# Patient Record
Sex: Female | Born: 1945 | Race: Black or African American | Hispanic: No | Marital: Married | Smoking: Former smoker
Health system: Southern US, Community
[De-identification: ages and names within clinical notes are randomized; demographics above are authoritative.]

## PROBLEM LIST (undated history)

## (undated) DIAGNOSIS — D126 Benign neoplasm of colon, unspecified: Secondary | ICD-10-CM

## (undated) DIAGNOSIS — I10 Essential (primary) hypertension: Secondary | ICD-10-CM

## (undated) DIAGNOSIS — IMO0001 Reserved for inherently not codable concepts without codable children: Secondary | ICD-10-CM

## (undated) DIAGNOSIS — K219 Gastro-esophageal reflux disease without esophagitis: Secondary | ICD-10-CM

## (undated) DIAGNOSIS — E785 Hyperlipidemia, unspecified: Secondary | ICD-10-CM

## (undated) DIAGNOSIS — C801 Malignant (primary) neoplasm, unspecified: Secondary | ICD-10-CM

## (undated) DIAGNOSIS — T7840XA Allergy, unspecified, initial encounter: Secondary | ICD-10-CM

## (undated) HISTORY — DX: Hyperlipidemia, unspecified: E78.5

## (undated) HISTORY — DX: Allergy, unspecified, initial encounter: T78.40XA

## (undated) HISTORY — PX: COLONOSCOPY: SHX174

## (undated) HISTORY — DX: Gastro-esophageal reflux disease without esophagitis: K21.9

## (undated) HISTORY — PX: POLYPECTOMY: SHX149

## (undated) HISTORY — DX: Benign neoplasm of colon, unspecified: D12.6

---

## 1997-09-22 ENCOUNTER — Ambulatory Visit: Admission: RE | Admit: 1997-09-22 | Discharge: 1997-09-22 | Payer: Self-pay | Admitting: *Deleted

## 1997-09-22 ENCOUNTER — Other Ambulatory Visit: Admission: RE | Admit: 1997-09-22 | Discharge: 1997-09-22 | Payer: Self-pay | Admitting: *Deleted

## 1997-09-22 ENCOUNTER — Encounter: Admission: RE | Admit: 1997-09-22 | Discharge: 1997-09-22 | Payer: Self-pay | Admitting: Internal Medicine

## 2000-04-22 ENCOUNTER — Other Ambulatory Visit: Admission: RE | Admit: 2000-04-22 | Discharge: 2000-04-22 | Payer: Self-pay | Admitting: Gynecology

## 2001-04-22 ENCOUNTER — Encounter: Payer: Self-pay | Admitting: Emergency Medicine

## 2001-04-22 ENCOUNTER — Emergency Department (HOSPITAL_COMMUNITY): Admission: EM | Admit: 2001-04-22 | Discharge: 2001-04-22 | Payer: Self-pay | Admitting: *Deleted

## 2001-05-19 ENCOUNTER — Other Ambulatory Visit: Admission: RE | Admit: 2001-05-19 | Discharge: 2001-05-19 | Payer: Self-pay | Admitting: Gynecology

## 2001-06-09 ENCOUNTER — Emergency Department (HOSPITAL_COMMUNITY): Admission: EM | Admit: 2001-06-09 | Discharge: 2001-06-09 | Payer: Self-pay | Admitting: *Deleted

## 2001-06-09 ENCOUNTER — Encounter: Payer: Self-pay | Admitting: *Deleted

## 2001-06-11 ENCOUNTER — Encounter: Admission: RE | Admit: 2001-06-11 | Discharge: 2001-06-11 | Payer: Self-pay | Admitting: Internal Medicine

## 2001-06-11 ENCOUNTER — Encounter: Payer: Self-pay | Admitting: Internal Medicine

## 2001-06-17 DIAGNOSIS — D126 Benign neoplasm of colon, unspecified: Secondary | ICD-10-CM

## 2001-06-17 HISTORY — DX: Benign neoplasm of colon, unspecified: D12.6

## 2001-06-21 ENCOUNTER — Emergency Department (HOSPITAL_COMMUNITY): Admission: EM | Admit: 2001-06-21 | Discharge: 2001-06-21 | Payer: Self-pay | Admitting: Emergency Medicine

## 2001-06-30 ENCOUNTER — Encounter: Payer: Self-pay | Admitting: Emergency Medicine

## 2001-06-30 ENCOUNTER — Emergency Department (HOSPITAL_COMMUNITY): Admission: EM | Admit: 2001-06-30 | Discharge: 2001-07-01 | Payer: Self-pay | Admitting: Emergency Medicine

## 2001-07-08 ENCOUNTER — Emergency Department (HOSPITAL_COMMUNITY): Admission: EM | Admit: 2001-07-08 | Discharge: 2001-07-08 | Payer: Self-pay | Admitting: Emergency Medicine

## 2001-07-20 ENCOUNTER — Emergency Department (HOSPITAL_COMMUNITY): Admission: EM | Admit: 2001-07-20 | Discharge: 2001-07-20 | Payer: Self-pay | Admitting: Emergency Medicine

## 2001-07-21 ENCOUNTER — Ambulatory Visit (HOSPITAL_COMMUNITY): Admission: RE | Admit: 2001-07-21 | Discharge: 2001-07-21 | Payer: Self-pay | Admitting: Gastroenterology

## 2001-07-28 ENCOUNTER — Emergency Department (HOSPITAL_COMMUNITY): Admission: EM | Admit: 2001-07-28 | Discharge: 2001-07-28 | Payer: Self-pay | Admitting: Emergency Medicine

## 2001-10-19 ENCOUNTER — Emergency Department (HOSPITAL_COMMUNITY): Admission: EM | Admit: 2001-10-19 | Discharge: 2001-10-19 | Payer: Self-pay | Admitting: Emergency Medicine

## 2003-03-24 ENCOUNTER — Other Ambulatory Visit: Admission: RE | Admit: 2003-03-24 | Discharge: 2003-03-24 | Payer: Self-pay | Admitting: Gynecology

## 2004-04-05 ENCOUNTER — Other Ambulatory Visit: Admission: RE | Admit: 2004-04-05 | Discharge: 2004-04-05 | Payer: Self-pay | Admitting: Gynecology

## 2005-05-17 ENCOUNTER — Other Ambulatory Visit: Admission: RE | Admit: 2005-05-17 | Discharge: 2005-05-17 | Payer: Self-pay | Admitting: Gynecology

## 2005-08-22 ENCOUNTER — Other Ambulatory Visit: Admission: RE | Admit: 2005-08-22 | Discharge: 2005-08-22 | Payer: Self-pay | Admitting: Internal Medicine

## 2010-08-07 ENCOUNTER — Emergency Department (HOSPITAL_COMMUNITY)
Admission: EM | Admit: 2010-08-07 | Discharge: 2010-08-07 | Disposition: A | Discharge status: Home / Self Care | Payer: Commercial Managed Care - PPO | Attending: Emergency Medicine | Admitting: Emergency Medicine

## 2010-08-07 DIAGNOSIS — E785 Hyperlipidemia, unspecified: Secondary | ICD-10-CM | POA: Insufficient documentation

## 2010-08-07 DIAGNOSIS — Z79899 Other long term (current) drug therapy: Secondary | ICD-10-CM | POA: Insufficient documentation

## 2010-08-07 DIAGNOSIS — I10 Essential (primary) hypertension: Secondary | ICD-10-CM | POA: Insufficient documentation

## 2010-08-07 DIAGNOSIS — R5381 Other malaise: Secondary | ICD-10-CM | POA: Insufficient documentation

## 2010-08-07 DIAGNOSIS — R112 Nausea with vomiting, unspecified: Secondary | ICD-10-CM | POA: Insufficient documentation

## 2010-08-07 DIAGNOSIS — K219 Gastro-esophageal reflux disease without esophagitis: Secondary | ICD-10-CM | POA: Insufficient documentation

## 2010-08-07 LAB — DIFFERENTIAL
Basophils Absolute: 0.1 10*3/uL (ref 0.0–0.1)
Basophils Relative: 1 % (ref 0–1)
Eosinophils Absolute: 0.1 10*3/uL (ref 0.0–0.7)
Eosinophils Relative: 1 % (ref 0–5)
Lymphocytes Relative: 28 % (ref 12–46)
Lymphs Abs: 2.2 10*3/uL (ref 0.7–4.0)
Monocytes Absolute: 0.5 10*3/uL (ref 0.1–1.0)
Monocytes Relative: 6 % (ref 3–12)
Neutro Abs: 4.8 10*3/uL (ref 1.7–7.7)
Neutrophils Relative %: 64 % (ref 43–77)

## 2010-08-07 LAB — COMPREHENSIVE METABOLIC PANEL
ALT: 15 U/L (ref 0–35)
AST: 18 U/L (ref 0–37)
Albumin: 3.9 g/dL (ref 3.5–5.2)
Alkaline Phosphatase: 79 U/L (ref 39–117)
BUN: 12 mg/dL (ref 6–23)
CO2: 30 mEq/L (ref 19–32)
Calcium: 9.6 mg/dL (ref 8.4–10.5)
Chloride: 100 mEq/L (ref 96–112)
Creatinine, Ser: 0.82 mg/dL (ref 0.4–1.2)
GFR calc Af Amer: >60 mL/min (ref >60)
GFR calc non Af Amer: >60 mL/min (ref >60)
Glucose, Bld: 113 mg/dL — ABNORMAL HIGH (ref 70–99)
Potassium: 4.6 mEq/L (ref 3.5–5.1)
Sodium: 138 mEq/L (ref 135–145)
Total Bilirubin: 0.4 mg/dL (ref 0.3–1.2)
Total Protein: 7.5 g/dL (ref 6.0–8.3)

## 2010-08-07 LAB — URINALYSIS, ROUTINE W REFLEX MICROSCOPIC
Hgb urine dipstick: NEGATIVE
Nitrite: NEGATIVE
Protein, ur: NEGATIVE mg/dL
Specific Gravity, Urine: 1.021 (ref 1.005–1.030)
Urine Glucose, Fasting: NEGATIVE mg/dL
pH: 5.5 (ref 5.0–8.0)

## 2010-08-07 LAB — CBC
HCT: 41.7 % (ref 36.0–46.0)
Hemoglobin: 14 g/dL (ref 12.0–15.0)
MCHC: 33.6 g/dL (ref 30.0–36.0)

## 2011-10-26 ENCOUNTER — Ambulatory Visit (INDEPENDENT_AMBULATORY_CARE_PROVIDER_SITE_OTHER): Payer: Commercial Managed Care - PPO | Admitting: Emergency Medicine

## 2011-10-26 VITALS — BP 150/80 | HR 71 | Temp 98.1°F | Resp 16 | Ht 67.0 in | Wt 172.0 lb

## 2011-10-26 DIAGNOSIS — R05 Cough: Secondary | ICD-10-CM

## 2011-10-26 DIAGNOSIS — J309 Allergic rhinitis, unspecified: Secondary | ICD-10-CM

## 2011-10-26 DIAGNOSIS — J45909 Unspecified asthma, uncomplicated: Secondary | ICD-10-CM

## 2011-10-26 MED ORDER — FLUTICASONE PROPIONATE 50 MCG/ACT NA SUSP: 2.0000 | Freq: Every day | NASAL | Status: DC

## 2011-10-26 MED ORDER — PREDNISONE 10 MG PO TABS: ORAL_TABLET | ORAL | Status: DC

## 2011-10-26 MED ORDER — ALBUTEROL SULFATE HFA 108 (90 BASE) MCG/ACT IN AERS: 2.0000 | INHALATION_SPRAY | RESPIRATORY_TRACT | Status: DC | PRN

## 2011-10-26 MED ORDER — ALBUTEROL SULFATE (2.5 MG/3ML) 0.083% IN NEBU
2.5000 mg | INHALATION_SOLUTION | Freq: Once | RESPIRATORY_TRACT | Status: AC
  Administered 2011-10-26: 2.5 mg via RESPIRATORY_TRACT

## 2011-10-26 NOTE — Progress Notes (Signed)
  Subjective:    Patient ID: Christina [name], female    DOB: 1946-05-31, 66 y.o.   MRN: [id number]  HPI patient had onset of symptoms approximately 2 weeks ago. She had development of headache congestion sore throat stuffy nose itchy watery eyes. Following this she has developed a dry nonproductive cough which is associated with shortness of breath and wheezing. She continues to smoke three quarters of a pack a day despite these symptoms    Review of Systems She's had no chest pain she has had some shortness of breath. primary problem has been cough and chest tightness.    Objective:   Physical Exam  Constitutional: She appears well-developed.  HENT:  Right Ear: External ear normal.  Left Ear: External ear normal.  Eyes: Pupils are equal, round, and reactive to light.  Neck: No tracheal deviation present. No thyromegaly present.  Cardiovascular: Normal rate and regular rhythm.   Pulmonary/Chest: Effort normal. No respiratory distress. She has wheezes. She has no rales.  Abdominal: Soft. Bowel sounds are normal.          Assessment & Plan:  Patient's history and symptoms sound allergic related. Will give an albuterol inhaler plan Taper dose of steroids to see if we can get her through the allergy season.

## 2011-10-26 NOTE — Patient Instructions (Signed)
Allergic Rhinitis  Allergic rhinitis is when the mucous membranes in the nose respond to allergens. Allergens are particles in the air that cause your body to have an allergic reaction. This causes you to release allergic antibodies. Through a chain of events, these eventually cause you to release histamine into the blood stream (hence the use of antihistamines). Although meant to be protective to the body, it is this release that causes your discomfort, such as frequent sneezing, congestion and an itchy runny nose.    CAUSES    The pollen allergens may come from grasses, trees, and weeds. This is seasonal allergic rhinitis, or "hay fever." Other allergens cause year-round allergic rhinitis (perennial allergic rhinitis) such as house dust mite allergen, pet dander and mold spores.    SYMPTOMS     Nasal stuffiness (congestion).   Runny, itchy nose with sneezing and tearing of the eyes.   There is often an itching of the mouth, eyes and ears.  It cannot be cured, but it can be controlled with medications.  DIAGNOSIS    If you are unable to determine the offending allergen, skin or blood testing may find it.  TREATMENT     Avoid the allergen.   Medications and allergy shots (immunotherapy) can help.   Hay fever may often be treated with antihistamines in pill or nasal spray forms. Antihistamines block the effects of histamine. There are over-the-counter medicines that may help with nasal congestion and swelling around the eyes. Check with your caregiver before taking or giving this medicine.  If the treatment above does not work, there are many new medications your caregiver can prescribe. Stronger medications may be used if initial measures are ineffective. Desensitizing injections can be used if medications and avoidance fails. Desensitization is when a patient is given ongoing shots until the body becomes less sensitive to the allergen. Make sure you follow up with your caregiver if problems continue.  SEEK  MEDICAL CARE IF:     You develop fever (more than 100.5 F (38.1 C).   You develop a cough that does not stop easily (persistent).   You have shortness of breath.   You start wheezing.   Symptoms interfere with normal daily activities.  Document Released: 02/26/2001 Document Revised: 05/23/2011 Document Reviewed: 09/07/2008  Exit[employer] Patient Information 2012 [employer].

## 2011-10-28 ENCOUNTER — Emergency Department (HOSPITAL_COMMUNITY)
Admission: EM | Admit: 2011-10-28 | Discharge: 2011-10-29 | Disposition: A | Discharge status: Home / Self Care | Payer: Commercial Managed Care - PPO | Attending: Emergency Medicine | Admitting: Emergency Medicine

## 2011-10-28 ENCOUNTER — Encounter (HOSPITAL_COMMUNITY): Payer: Self-pay | Admitting: Emergency Medicine

## 2011-10-28 DIAGNOSIS — M549 Dorsalgia, unspecified: Secondary | ICD-10-CM | POA: Insufficient documentation

## 2011-10-28 HISTORY — DX: Reserved for inherently not codable concepts without codable children: IMO0001

## 2011-10-28 HISTORY — DX: Gastro-esophageal reflux disease without esophagitis: K21.9

## 2011-10-28 HISTORY — DX: Essential (primary) hypertension: I10

## 2011-10-28 NOTE — ED Notes (Signed)
Pt alert, nad, c/o low back pain, onset this evening, denies trauma or injury, ambulates to room steady gait noted

## 2011-11-05 ENCOUNTER — Telehealth: Payer: Self-pay

## 2011-11-05 NOTE — Telephone Encounter (Signed)
NO answer, no voicemail. Try later.

## 2011-11-05 NOTE — Telephone Encounter (Signed)
PT STATES SHE WAS SEEN THE OTHER DAY AND SHE NEED AN ANTIBIOTIC CALLED IN PLEASE CALL  PT AT [phone] UNTIL 3:00 AND THEN CALL [phone] AFTER 3:00  [facility] ON [address]

## 2011-11-06 MED ORDER — AMOXICILLIN 875 MG PO TABS: 875.0000 mg | ORAL_TABLET | Freq: Two times a day (BID) | ORAL | Status: AC

## 2011-11-06 NOTE — Telephone Encounter (Signed)
Gave pt info on Rx and RTC instr's. Pt agreed

## 2011-11-06 NOTE — Telephone Encounter (Signed)
Sent in antibiotic. RTC precautions

## 2011-11-06 NOTE — Telephone Encounter (Signed)
Spoke with pt states she has taken the prednisone and it has not cleared up her chest congestion. No nasal drainage, only sitting in her chest. States she has taken Penicillin before for same sxs and it helped clear her up. Please advise. Pt can be reached at [phone] if needed.

## 2011-11-11 ENCOUNTER — Ambulatory Visit (INDEPENDENT_AMBULATORY_CARE_PROVIDER_SITE_OTHER): Payer: Commercial Managed Care - PPO | Admitting: Family Medicine

## 2011-11-11 ENCOUNTER — Encounter: Payer: Self-pay | Admitting: Family Medicine

## 2011-11-11 VITALS — BP 122/74 | HR 89 | Temp 98.4°F | Resp 16 | Ht 67.0 in | Wt 172.2 lb

## 2011-11-11 DIAGNOSIS — J40 Bronchitis, not specified as acute or chronic: Secondary | ICD-10-CM

## 2011-11-11 MED ORDER — METHYLPREDNISOLONE ACETATE 80 MG/ML IJ SUSP
80.0000 mg | Freq: Once | INTRAMUSCULAR | Status: AC
  Administered 2011-11-11: 80 mg via INTRAMUSCULAR

## 2011-11-11 MED ORDER — HYDROCODONE-HOMATROPINE 5-1.5 MG/5ML PO SYRP: 5.0000 mL | ORAL_SOLUTION | Freq: Three times a day (TID) | ORAL | Status: AC | PRN

## 2011-11-11 NOTE — Progress Notes (Signed)
  Subjective:    Patient ID: Christina [name], female    DOB: 23-Apr-1946, 66 y.o.   MRN: [id number]  HPI 66 yo female here to follow-up cough, allergies.  Seen 5/11.  Rx'd flonase, albuterol, and prednisone taper.  Didn't improve as hoped.  Called and amoxicilin called out 5/22.  Since, Almost finished with antibiotic, finished prednisone, using nose spray everyday, takes the inaler daily as well.  Some congestion improved but still with scratchy throat and cough.  Cough is worse at night.  Keeps her up.  Smoker.  Has gone 2 weeks without smoking since being sick.  Going to try to keep that up. No fevers.  Still some nasal congestion/running.  Nt taking any OTC allergy  Medi[facility].      Review of Systems Negative except as per HPI     Objective:   Physical Exam  Constitutional: She appears well-developed. No distress.  HENT:  Right Ear: Tympanic membrane, external ear and ear canal normal. Tympanic membrane is not injected, not scarred, not perforated, not erythematous, not retracted and not bulging.  Left Ear: Tympanic membrane, external ear and ear canal normal. Tympanic membrane is not injected, not scarred, not perforated, not erythematous, not retracted and not bulging.  Nose: No mucosal edema or rhinorrhea. Right sinus exhibits no maxillary sinus tenderness and no frontal sinus tenderness. Left sinus exhibits no maxillary sinus tenderness and no frontal sinus tenderness.  Mouth/Throat: Uvula is midline, oropharynx is clear and moist and mucous membranes are normal. No oropharyngeal exudate or tonsillar abscesses.  Cardiovascular: Normal rate, regular rhythm, normal heart sounds and intact distal pulses.   No murmur heard. Pulmonary/Chest: Effort normal and breath sounds normal. No respiratory distress. She has no wheezes. She has no rales.  Lymphadenopathy:       Head (right side): No submandibular and no preauricular adenopathy present.       Head (left side): No submandibular and no  preauricular adenopathy present.       Right cervical: No superficial cervical and no posterior cervical adenopathy present.      Left cervical: No superficial cervical and no posterior cervical adenopathy present.       Right: No supraclavicular adenopathy present.       Left: No supraclavicular adenopathy present.  Skin: Skin is warm and dry.          Assessment & Plan:  Bronchitis - depomedrol, hycodan (has had this before)

## 2011-11-16 ENCOUNTER — Other Ambulatory Visit: Payer: Self-pay | Admitting: *Deleted

## 2011-11-16 ENCOUNTER — Ambulatory Visit (INDEPENDENT_AMBULATORY_CARE_PROVIDER_SITE_OTHER): Payer: Commercial Managed Care - PPO | Admitting: Emergency Medicine

## 2011-11-16 VITALS — BP 139/80 | HR 76 | Temp 98.1°F | Resp 16 | Ht 67.5 in | Wt 167.0 lb

## 2011-11-16 DIAGNOSIS — R609 Edema, unspecified: Secondary | ICD-10-CM

## 2011-11-16 DIAGNOSIS — Z91018 Allergy to other foods: Secondary | ICD-10-CM

## 2011-11-16 DIAGNOSIS — T7840XA Allergy, unspecified, initial encounter: Secondary | ICD-10-CM

## 2011-11-16 DIAGNOSIS — J45909 Unspecified asthma, uncomplicated: Secondary | ICD-10-CM

## 2011-11-16 MED ORDER — PREDNISOLONE SODIUM PHOSPHATE 15 MG/5ML PO SOLN
20.0000 mg | Freq: Once | ORAL | Status: AC
  Administered 2011-11-16: 20 mg via ORAL

## 2011-11-16 MED ORDER — PREDNISONE (PAK) 10 MG PO TABS: ORAL_TABLET | ORAL | Status: DC

## 2011-11-16 MED ORDER — LORATADINE 10 MG PO TABS
10.0000 mg | ORAL_TABLET | Freq: Once | ORAL | Status: AC
  Administered 2011-11-16: 10 mg via ORAL

## 2011-11-16 MED ORDER — PREDNISONE 10 MG PO TABS: ORAL_TABLET | ORAL | Status: DC

## 2011-11-16 MED ORDER — EPINEPHRINE 0.3 MG/0.3ML IJ DEVI: 0.3000 mg | Freq: Once | INTRAMUSCULAR | Status: DC

## 2011-11-16 MED ORDER — FAMOTIDINE 20 MG PO TABS
20.0000 mg | ORAL_TABLET | Freq: Once | ORAL | Status: AC
  Administered 2011-11-16: 20 mg via ORAL

## 2011-11-16 NOTE — Progress Notes (Signed)
  Subjective:    Patient ID: Christina [name], female    DOB: [birth year], 66 y.o.   MRN: [id number]  HPI patient was fine until last night at supper she was eating in a [place]. About 30 minutes after eating shrimp and mushrooms she developed swelling of her lips face and a rash on her neck and shoulders. She did not feel short of breath with this episode but did have significant swelling. She did not know what to do and did not take any medication just went home. Over the evening she is improved. She has not had any difficulty with her breathing. She is hoarse but has been hoarse for the last 3-4 days.    Review of Systems she is recovering from her recent upper respiratory infection associated with allergic rhinitis. She has finished out a course of amoxicillin and has been on prednisone and recently received a Depo-Medrol shot.     Objective:   Physical Exam physical exam reveals an alert female who is in no distress. There is swelling noted over the right upper lip which extends to the right side of the face. Examination of the posterior pharynx reveals no swelling of the soft palate. Patient's brace is worse however there is no stridor noted over the trachea. Lungs are clear. 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 I demonstrated the use of an EpiPen she was given prednisone 20 cc equal 60 mg as well as Zantac 150 and Zyrtec 10 mg.

## 2012-01-26 ENCOUNTER — Ambulatory Visit (INDEPENDENT_AMBULATORY_CARE_PROVIDER_SITE_OTHER): Payer: Commercial Managed Care - PPO | Admitting: Physician Assistant

## 2012-01-26 VITALS — BP 136/84 | HR 75 | Temp 98.3°F | Resp 16 | Ht 67.5 in | Wt 168.0 lb

## 2012-01-26 DIAGNOSIS — K219 Gastro-esophageal reflux disease without esophagitis: Secondary | ICD-10-CM

## 2012-01-26 DIAGNOSIS — E785 Hyperlipidemia, unspecified: Secondary | ICD-10-CM

## 2012-01-26 DIAGNOSIS — I10 Essential (primary) hypertension: Secondary | ICD-10-CM

## 2012-01-26 LAB — COMPREHENSIVE METABOLIC PANEL
ALT: 11 U/L (ref 0–35)
AST: 19 U/L (ref 0–37)
Albumin: 4.2 g/dL (ref 3.5–5.2)
Alkaline Phosphatase: 90 U/L (ref 39–117)
BUN: 13 mg/dL (ref 6–23)
CO2: 29 mEq/L (ref 19–32)
Calcium: 9.7 mg/dL (ref 8.4–10.5)
Chloride: 105 mEq/L (ref 96–112)
Creat: 0.81 mg/dL (ref 0.50–1.10)
Glucose, Bld: 78 mg/dL (ref 70–99)
Potassium: 4.3 mEq/L (ref 3.5–5.3)
Sodium: 141 mEq/L (ref 135–145)
Total Bilirubin: 0.9 mg/dL (ref 0.3–1.2)
Total Protein: 7 g/dL (ref 6.0–8.3)

## 2012-01-26 LAB — LIPID PANEL
Cholesterol: 173 mg/dL (ref 0–200)
HDL: 54 mg/dL (ref >39)
LDL Cholesterol: 99 mg/dL (ref 0–99)
Total CHOL/HDL Ratio: 3.2 Ratio
Triglycerides: 101 mg/dL (ref <150)
VLDL: 20 mg/dL (ref 0–40)

## 2012-01-26 LAB — POCT CBC
Granulocyte percent: 50.1 %G (ref 37–80)
HCT, POC: 42.4 % (ref 37.7–47.9)
Hemoglobin: 12.8 g/dL (ref 12.2–16.2)
Lymph, poc: 2.8 (ref 0.6–3.4)
MCH, POC: 27.8 pg (ref 27–31.2)
MCHC: 30.2 g/dL — AB (ref 31.8–35.4)
MCV: 92.2 fL (ref 80–97)
MID (cbc): 0.6 (ref 0–0.9)
MPV: 9.3 fL (ref 0–99.8)
POC Granulocyte: 3.5 (ref 2–6.9)
POC LYMPH PERCENT: 40.9 %L (ref 10–50)
POC MID %: 9 %M (ref 0–12)
Platelet Count, POC: 332 10*3/uL (ref 142–424)
RBC: 4.6 M/uL (ref 4.04–5.48)
RDW, POC: 15.8 %
WBC: 6.9 10*3/uL (ref 4.6–10.2)

## 2012-01-26 LAB — POCT UA - MICROSCOPIC ONLY
Casts, Ur, LPF, POC: NEGATIVE
Crystals, Ur, HPF, POC: NEGATIVE
Yeast, UA: NEGATIVE

## 2012-01-26 LAB — POCT URINALYSIS DIPSTICK
Bilirubin, UA: NEGATIVE
Glucose, UA: NEGATIVE
Ketones, UA: NEGATIVE
Leukocytes, UA: NEGATIVE
Nitrite, UA: NEGATIVE
Protein, UA: NEGATIVE
Spec Grav, UA: 1.015
Urobilinogen, UA: 0.2
pH, UA: 5.5

## 2012-01-26 MED ORDER — AMLODIPINE BESYLATE 5 MG PO TABS: 5.0000 mg | ORAL_TABLET | Freq: Every day | ORAL | Status: DC

## 2012-01-26 MED ORDER — SIMVASTATIN 40 MG PO TABS: 40.0000 mg | ORAL_TABLET | Freq: Every evening | ORAL | Status: DC

## 2012-01-26 MED ORDER — TRANDOLAPRIL 4 MG PO TABS: 4.0000 mg | ORAL_TABLET | Freq: Every day | ORAL | Status: DC

## 2012-01-26 MED ORDER — ESOMEPRAZOLE MAGNESIUM 40 MG PO CPDR: 40.0000 mg | DELAYED_RELEASE_CAPSULE | Freq: Every day | ORAL | Status: DC

## 2012-01-26 NOTE — Progress Notes (Signed)
  Subjective:    Patient ID: Christina [name], female    DOB: 1945/11/03, 66 y.o.   MRN: [id number]  HPI 66 year old female here for medication refills.  She is doing well on her current treatment. Denies chest pain, SOB, headaches, myalgias, or arthralgias.  She is unsure when her last labwork was, so we will check those today.  Otherwise she is doing well. She continues to smoke, but says she is slowly trying to decrease the daily number, and really wants to quit.      Review of Systems  All other systems reviewed and are negative.       Objective:   Physical Exam  Constitutional: She is oriented to person, place, and time. She appears well-developed and well-nourished.  HENT:  Head: Normocephalic and atraumatic.  Right Ear: External ear normal.  Left Ear: External ear normal.  Eyes: Conjunctivae are normal.  Neck: Normal range of motion. No thyromegaly present.  Cardiovascular: Normal rate, regular rhythm and normal heart sounds.   Pulmonary/Chest: Breath sounds normal.  Lymphadenopathy:    She has no cervical adenopathy.  Neurological: She is alert and oriented to person, place, and time.  Psychiatric: She has a normal mood and affect. Her behavior is normal. Judgment and thought content normal.          Assessment & Plan:   1. Hypertension  Controlled - continue current treatment plan Follow up in 6 months for CPE.  POCT CBC, Comprehensive metabolic panel, POCT UA - Microscopic Only, POCT urinalysis dipstick, amLODipine (NORVASC) 5 MG tablet, trandolapril (MAVIK) 4 MG tablet  2. Hyperlipidemia  Will await labs.  Lipid panel, simvastatin (ZOCOR) 40 MG tablet  3. GERD (gastroesophageal reflux disease)  Stable with current treatment.  esomeprazole (NEXIUM) 40 MG capsule

## 2012-07-23 ENCOUNTER — Ambulatory Visit (INDEPENDENT_AMBULATORY_CARE_PROVIDER_SITE_OTHER): Payer: Medicare Other | Admitting: Internal Medicine

## 2012-07-23 VITALS — BP 120/76 | HR 101 | Temp 98.0°F | Resp 16 | Ht 67.5 in | Wt 168.0 lb

## 2012-07-23 DIAGNOSIS — I10 Essential (primary) hypertension: Secondary | ICD-10-CM

## 2012-07-23 DIAGNOSIS — E785 Hyperlipidemia, unspecified: Secondary | ICD-10-CM

## 2012-07-23 DIAGNOSIS — R197 Diarrhea, unspecified: Secondary | ICD-10-CM

## 2012-07-23 DIAGNOSIS — E789 Disorder of lipoprotein metabolism, unspecified: Secondary | ICD-10-CM

## 2012-07-23 DIAGNOSIS — K219 Gastro-esophageal reflux disease without esophagitis: Secondary | ICD-10-CM

## 2012-07-23 DIAGNOSIS — R1110 Vomiting, unspecified: Secondary | ICD-10-CM

## 2012-07-23 LAB — POCT CBC
Lymph, poc: 1.1 (ref 0.6–3.4)
MCH, POC: 29.2 pg (ref 27–31.2)
MCHC: 32.2 g/dL (ref 31.8–35.4)
MID (cbc): 0.3 (ref 0–0.9)
MPV: 8.5 fL (ref 0–99.8)
POC MID %: 2.5 %M (ref 0–12)
Platelet Count, POC: 354 10*3/uL (ref 142–424)
WBC: 12.2 10*3/uL — AB (ref 4.6–10.2)

## 2012-07-23 LAB — POCT URINALYSIS DIPSTICK
Nitrite, UA: NEGATIVE
Protein, UA: 100
Urobilinogen, UA: 0.2
pH, UA: 5.5

## 2012-07-23 LAB — COMPREHENSIVE METABOLIC PANEL
AST: 14 U/L (ref 0–37)
Albumin: 4.6 g/dL (ref 3.5–5.2)
Alkaline Phosphatase: 87 U/L (ref 39–117)
BUN: 19 mg/dL (ref 6–23)
Potassium: 4.3 mEq/L (ref 3.5–5.3)
Sodium: 143 mEq/L (ref 135–145)

## 2012-07-23 LAB — POCT UA - MICROSCOPIC ONLY
Casts, Ur, LPF, POC: NEGATIVE
Crystals, Ur, HPF, POC: NEGATIVE

## 2012-07-23 MED ORDER — ESOMEPRAZOLE MAGNESIUM 40 MG PO CPDR: 40.0000 mg | DELAYED_RELEASE_CAPSULE | Freq: Every day | ORAL | Status: DC

## 2012-07-23 MED ORDER — TRANDOLAPRIL 4 MG PO TABS: 4.0000 mg | ORAL_TABLET | Freq: Every day | ORAL | Status: DC

## 2012-07-23 MED ORDER — ONDANSETRON 4 MG PO TBDP
8.0000 mg | ORAL_TABLET | Freq: Once | ORAL | Status: AC
  Administered 2012-07-23: 8 mg via ORAL

## 2012-07-23 MED ORDER — ONDANSETRON HCL 8 MG PO TABS: 8.0000 mg | ORAL_TABLET | Freq: Three times a day (TID) | ORAL | Status: DC | PRN

## 2012-07-23 MED ORDER — AMLODIPINE BESYLATE 5 MG PO TABS: 5.0000 mg | ORAL_TABLET | Freq: Every day | ORAL | Status: DC

## 2012-07-23 MED ORDER — SIMVASTATIN 40 MG PO TABS: 40.0000 mg | ORAL_TABLET | Freq: Every evening | ORAL | Status: DC

## 2012-07-23 NOTE — Progress Notes (Signed)
  Subjective:    Patient ID: Christina [name], female    DOB: 02-13-1946, 67 y.o.   MRN: [id number]  HPI Started with nausea and vomiting 8 hrs ago, then 3 loose brown stools followed. No abdominal pain or fever.Still nauseas. Also wants her bp and cholesterol meds refilled. Last visit 6 months. Still smoking   Review of Systems Htn, gerd, lipids    Objective:   Physical Exam  Vitals reviewed. Constitutional: She is oriented to person, place, and time. She appears well-developed and well-nourished. No distress.  HENT:  Right Ear: External ear normal.  Left Ear: External ear normal.  Nose: Nose normal.  Mouth/Throat: Oropharynx is clear and moist.  Eyes: EOM are normal. No scleral icterus.  Neck: Neck supple.  Cardiovascular: Normal rate, regular rhythm and normal heart sounds.   Pulmonary/Chest: Effort normal and breath sounds normal.  Abdominal: Soft. Bowel sounds are normal. She exhibits no mass. There is no tenderness. There is no guarding.  Neurological: She is alert and oriented to person, place, and time.  Skin: Skin is warm and dry.  Psychiatric: She has a normal mood and affect.     Results for orders placed in visit on 07/23/12  POCT CBC      Component Value Range   WBC 12.2 (*) 4.6 - 10.2 K/uL   Lymph, poc 1.1  0.6 - 3.4   POC LYMPH PERCENT 9.3 (*) 10 - 50 %L   MID (cbc) 0.3  0 - 0.9   POC MID % 2.5  0 - 12 %M   POC Granulocyte 10.8 (*) 2 - 6.9   Granulocyte percent 88.2 (*) 37 - 80 %G   RBC 4.90  4.04 - 5.48 M/uL   Hemoglobin 14.3  12.2 - 16.2 g/dL   HCT, POC 40.9  81.1 - 47.9 %   MCV 90.6  80 - 97 fL   MCH, POC 29.2  27 - 31.2 pg   MCHC 32.2  31.8 - 35.4 g/dL   RDW, POC 91.4     Platelet Count, POC 354  142 - 424 K/uL   MPV 8.5  0 - 99.8 fL  POCT URINALYSIS DIPSTICK      Component Value Range   Color, UA yellow     Clarity, UA clear     Glucose, UA negative     Bilirubin, UA moderate     Ketones, UA 15     Spec Grav, UA >=1.030     Blood, UA small      pH, UA 5.5     Protein, UA 100     Urobilinogen, UA 0.2     Nitrite, UA negative     Leukocytes, UA Negative    POCT UA - MICROSCOPIC ONLY      Component Value Range   WBC, Ur, HPF, POC 0-2     RBC, urine, microscopic 2-3     Bacteria, U Microscopic 4-8     Mucus, UA negative     Epithelial cells, urine per micros 4-8     Crystals, Ur, HPF, POC negative     Casts, Ur, LPF, POC negative     Yeast, UA trace          Assessment & Plan:  Smoking and must quit Gastroenteritis HTN/Lipids stable and RF meds 1 year

## 2012-07-23 NOTE — Patient Instructions (Signed)
Viral Gastroenteritis Viral gastroenteritis is also known as stomach flu. This condition affects the stomach and intestinal tract. It can cause sudden diarrhea and vomiting. The illness typically lasts 3 to 8 days. Most people develop an immune response that eventually gets rid of the virus. While this natural response develops, the virus can make you quite ill. CAUSES  Many different viruses can cause gastroenteritis, such as rotavirus or noroviruses. You can catch one of these viruses by consuming contaminated food or water. You may also catch a virus by sharing utensils or other personal items with an infected person or by touching a contaminated surface. SYMPTOMS  The most common symptoms are diarrhea and vomiting. These problems can cause a severe loss of body fluids (dehydration) and a body salt (electrolyte) imbalance. Other symptoms may include:  Fever.  Headache.  Fatigue.  Abdominal pain. DIAGNOSIS  Your caregiver can usually diagnose viral gastroenteritis based on your symptoms and a physical exam. A stool sample may also be taken to test for the presence of viruses or other infections. TREATMENT  This illness typically goes away on its own. Treatments are aimed at rehydration. The most serious cases of viral gastroenteritis involve vomiting so severely that you are not able to keep fluids down. In these cases, fluids must be given through an intravenous line (IV). HOME CARE INSTRUCTIONS   Drink enough fluids to keep your urine clear or pale yellow. Drink small amounts of fluids frequently and increase the amounts as tolerated.  Ask your caregiver for specific rehydration instructions.  Avoid:  Foods high in sugar.  Alcohol.  Carbonated drinks.  Tobacco.  Juice.  Caffeine drinks.  Extremely hot or cold fluids.  Fatty, greasy foods.  Too much intake of anything at one time.  Dairy products until 24 to 48 hours after diarrhea stops.  You may consume probiotics.  Probiotics are active cultures of beneficial bacteria. They may lessen the amount and number of diarrheal stools in adults. Probiotics can be found in yogurt with active cultures and in supplements.  Wash your hands well to avoid spreading the virus.  Only take over-the-counter or prescription medicines for pain, discomfort, or fever as directed by your caregiver. Do not give aspirin to children. Antidiarrheal medicines are not recommended.  Ask your caregiver if you should continue to take your regular prescribed and over-the-counter medicines.  Keep all follow-up appointments as directed by your caregiver. SEEK IMMEDIATE MEDICAL CARE IF:   You are unable to keep fluids down.  You do not urinate at least once every 6 to 8 hours.  You develop shortness of breath.  You notice blood in your stool or vomit. This may look like coffee grounds.  You have abdominal pain that increases or is concentrated in one small area (localized).  You have persistent vomiting or diarrhea.  You have a fever.  The patient is a child younger than 3 months, and he or she has a fever.  The patient is a child older than 3 months, and he or she has a fever and persistent symptoms.  The patient is a child older than 3 months, and he or she has a fever and symptoms suddenly get worse.  The patient is a baby, and he or she has no tears when crying. MAKE SURE YOU:   Understand these instructions.  Will watch your condition.  Will get help right away if you are not doing well or get worse. Document Released: 06/03/2005 Document Revised: 08/26/2011 Document Reviewed: 03/20/2011   [employer] Patient Information 2013 [employer].  

## 2012-07-26 ENCOUNTER — Ambulatory Visit (INDEPENDENT_AMBULATORY_CARE_PROVIDER_SITE_OTHER): Payer: Medicare Other | Admitting: Internal Medicine

## 2012-07-26 VITALS — BP 116/82 | HR 73 | Temp 98.3°F | Resp 16 | Ht 67.0 in | Wt 171.0 lb

## 2012-07-26 DIAGNOSIS — R197 Diarrhea, unspecified: Secondary | ICD-10-CM

## 2012-07-26 DIAGNOSIS — R110 Nausea: Secondary | ICD-10-CM

## 2012-07-26 NOTE — Progress Notes (Signed)
  Subjective:    Patient ID: Christina [name], female    DOB: March 19, 1946, 67 y.o.   MRN: [id number]  HPI  Patient came in Thursday and saw Dr Perrin Maltese with diarrhea and vomiting. She was given Zofran for nausea. It did seem to help a little. After being controlled yesterday, She woke up this morning with the same symptoms, diarrhea x once today.  She tried the Zofran again and it does not seem to help any longer. She tried eating oatmeal and was not able to finish it. No one else at home is ill. Sister came in last week with the same symptoms. No fever. She is still having difficulty eating due to no appetite. No cramping, no problems with urination. No cough or UR symptoms.   Review of Systems     Objective:   Physical Exam No acute distress  Vital signs stable Lungs clear Abdomen nontender/bowel sounds present but not hyperactive       Assessment & Plan:  Problem #1 probable gastroenteritis resolving Soft diet/ freq small intake/ lots of liquids

## 2012-08-21 ENCOUNTER — Encounter: Payer: Medicare Other | Admitting: Family Medicine

## 2012-11-16 ENCOUNTER — Ambulatory Visit (INDEPENDENT_AMBULATORY_CARE_PROVIDER_SITE_OTHER): Payer: BC Managed Care – PPO | Admitting: Family Medicine

## 2012-11-16 ENCOUNTER — Ambulatory Visit: Payer: BC Managed Care – PPO

## 2012-11-16 VITALS — BP 127/84 | HR 85 | Temp 98.3°F | Resp 18 | Ht 67.25 in | Wt 168.0 lb

## 2012-11-16 DIAGNOSIS — R05 Cough: Secondary | ICD-10-CM

## 2012-11-16 DIAGNOSIS — I10 Essential (primary) hypertension: Secondary | ICD-10-CM

## 2012-11-16 DIAGNOSIS — E785 Hyperlipidemia, unspecified: Secondary | ICD-10-CM

## 2012-11-16 DIAGNOSIS — R197 Diarrhea, unspecified: Secondary | ICD-10-CM

## 2012-11-16 DIAGNOSIS — R059 Cough, unspecified: Secondary | ICD-10-CM

## 2012-11-16 LAB — POCT CBC
Granulocyte percent: 46.8 %G (ref 37–80)
HCT, POC: 44.5 % (ref 37.7–47.9)
MPV: 9.8 fL (ref 0–99.8)
POC Granulocyte: 2.4 (ref 2–6.9)
POC LYMPH PERCENT: 43.1 %L (ref 10–50)
Platelet Count, POC: 279 10*3/uL (ref 142–424)
RDW, POC: 15 %

## 2012-11-16 MED ORDER — AMLODIPINE BESYLATE 5 MG PO TABS: 5.0000 mg | ORAL_TABLET | Freq: Every day | ORAL | Status: DC

## 2012-11-16 MED ORDER — SIMVASTATIN 20 MG PO TABS: 20.0000 mg | ORAL_TABLET | Freq: Every evening | ORAL | Status: DC

## 2012-11-16 MED ORDER — BENZONATATE 100 MG PO CAPS: 100.0000 mg | ORAL_CAPSULE | Freq: Three times a day (TID) | ORAL | Status: DC | PRN

## 2012-11-16 MED ORDER — TRANDOLAPRIL 4 MG PO TABS: 4.0000 mg | ORAL_TABLET | Freq: Every day | ORAL | Status: DC

## 2012-11-16 NOTE — Patient Instructions (Addendum)
Use the inhaler as needed for cough and wheezing.  Use the tessalon perles as needed for cough. Stick to a bland diet, and drink plenty of fluids.  If the diarrhea or vomiting continue please call me.  Otherwise, please let me know if the cough is not better in the next week- Sooner if worse.  You might also want to try some plain mucinex for your cough.    Good luck with quitting smoking! Albuterol inhaler: 2 puffs every 4 to 6 hours as needed

## 2012-11-16 NOTE — Progress Notes (Signed)
[facility] [address] [place] [address] [phone]  Date:  11/16/2012   Name:  Christina [name]   DOB:  April 18, 1946   MRN:  [id number]  PCP:  Tally Due, MD    Chief Complaint: Cough, Emesis and Diarrhea   History of Present Illness:  Christina [name] is a 67 y.o. very pleasant female patient who presents with the following:  She is here with a cough and sinus congestion.  She has been coughing for about one week- the cough is generally dry She then started having diarrhea about 3 days ago.  She vomited once yesterday. It was post- tussive emesis- no other vomiting.  She is having diarrhea "quite a bit" for the last 2 days, but none today so far.   She has noted a mild subjective fever.  No aches or chills She has noted a runny nose, mild ST, ears feel congested.  No abdominal pain Still able to eat- nothing yet this morning however.  Able to tolerate liquids ok. No blood in her stool.  She is trying to quit smoking  Patient Active Problem List   Diagnosis Date Noted  . HTN (hypertension) 07/23/2012  . Lipid disorder 07/23/2012    Past Medical History  Diagnosis Date  . Reflux   . Hypertension   . Hyperlipidemia   . GERD (gastroesophageal reflux disease)     No past surgical history on file.  History  Substance Use Topics  . Smoking status: Current Every Day Smoker -- 0.50 packs/day    Types: Cigarettes  . Smokeless tobacco: Not on file  . Alcohol Use: No    Family History  Problem Relation Age of Onset  . Hypertension Mother   . Leukemia Father     Allergies  Allergen Reactions  . Codeine     Medication list has been reviewed and updated.  Current Outpatient Prescriptions on File Prior to Visit  Medication Sig Dispense Refill  . amLODipine (NORVASC) 5 MG tablet Take 1 tablet (5 mg total) by mouth daily.  90 tablet  1  . EPINEPHrine (EPIPEN) 0.3 mg/0.3 mL DEVI Inject 0.3 mLs (0.3 mg total) into the muscle once.  1  Device  5  . esomeprazole (NEXIUM) 40 MG capsule Take 1 capsule (40 mg total) by mouth daily before breakfast.  90 capsule  1  . simvastatin (ZOCOR) 40 MG tablet Take 1 tablet (40 mg total) by mouth every evening.  90 tablet  1  . trandolapril (MAVIK) 4 MG tablet Take 1 tablet (4 mg total) by mouth daily.  90 tablet  1  . ondansetron (ZOFRAN) 8 MG tablet Take 1 tablet (8 mg total) by mouth every 8 (eight) hours as needed for nausea.  20 tablet  0   No current facility-administered medications on file prior to visit.    Review of Systems:  As per HPI- otherwise negative. She is a smoker  Physical Examination: Filed Vitals:   11/16/12 0821  BP: 127/84  Pulse: 85  Temp: 98.3 F (36.8 C)  Resp: 18   Filed Vitals:   11/16/12 0821  Height: 5' 7.25" (1.708 m)  Weight: 168 lb (76.204 kg)   Body mass index is 26.12 kg/(m^2). Ideal Body Weight: Weight in (lb) to have BMI = 25: 160.5  GEN: WDWN, NAD, Non-toxic, A & O x 3, looks well HEENT: Atraumatic, Normocephalic. Neck supple. No masses, No LAD.  Bilateral TM wnl, oropharynx normal.  PEERL,EOMI.  Ears and Nose: No external deformity. CV: RRR, No M/G/R. No JVD. No thrill. No extra heart sounds. PULM: CTA B, no crackles, rhonchi. No retractions. No resp. distress. No accessory muscle use. Minimal wheezes bilaterally ABD: S, NT, ND, +BS. No rebound. No HSM. Abdomen is benign EXTR: No c/c/e NEURO Normal gait.  PSYCH: Normally interactive. Conversant. Not depressed or anxious appearing.  Calm demeanor.   [facility] reading (PRIMARY) by  Dr. Patsy Lager. CXR: negative- no effusion, no infiltrate  *RADIOLOGY REPORT*  Clinical Data: Cough and congestion  CHEST - 2 VIEW  Comparison: None.  Findings: The heart and pulmonary vascularity are within normal limits. The lungs are clear but hyperinflated. No bony abnormality is seen.  IMPRESSION: COPD without acute abnormality.  Clinically significant discrepancy from primary report,  if provided: None  Results for orders placed in visit on 11/16/12  POCT CBC      Result Value Range   WBC 5.1  4.6 - 10.2 K/uL   Lymph, poc 2.2  0.6 - 3.4   POC LYMPH PERCENT 43.1  10 - 50 %L   MID (cbc) 0.5  0 - 0.9   POC MID % 10.1  0 - 12 %M   POC Granulocyte 2.4  2 - 6.9   Granulocyte percent 46.8  37 - 80 %G   RBC 4.77  4.04 - 5.48 M/uL   Hemoglobin 14.3  12.2 - 16.2 g/dL   HCT, POC 16.1  09.6 - 47.9 %   MCV 93.2  80 - 97 fL   MCH, POC 30.0  27 - 31.2 pg   MCHC 32.1  31.8 - 35.4 g/dL   RDW, POC 04.5     Platelet Count, POC 279  142 - 424 K/uL   MPV 9.8  0 - 99.8 fL     Assessment and Plan: Cough - Plan: DG Chest 2 View, benzonatate (TESSALON) 100 MG capsule  Diarrhea - Plan: POCT CBC  Hypertension - Plan: amLODipine (NORVASC) 5 MG tablet, trandolapril (MAVIK) 4 MG tablet  Hyperlipidemia - Plan: simvastatin (ZOCOR) 20 MG tablet  Refilled medications today.   Likely viral illness causing cough and GI symptoms.  encouraged her to stop smoking- she is trying  See patient instructions for more details.   Letter regarding her cxr over- read   Signed Abbe Amsterdam, MD

## 2012-11-18 ENCOUNTER — Telehealth: Payer: Self-pay

## 2012-11-18 DIAGNOSIS — R05 Cough: Secondary | ICD-10-CM

## 2012-11-18 MED ORDER — DOXYCYCLINE HYCLATE 100 MG PO TABS: 100.0000 mg | ORAL_TABLET | Freq: Two times a day (BID) | ORAL | Status: DC

## 2012-11-18 NOTE — Telephone Encounter (Signed)
Called her back- she continues to have a bad cough and sinus congestion.  Her diarrhea is now gone. He grandchildren were treated with abx for a similar illness and she would like one too. This is ok- will call in doxycycline for her.  Cautioned her to use a probiotic, and if diarrhea comes back please let me know

## 2012-11-18 NOTE — Telephone Encounter (Signed)
Called her, is she using Mucinex? She states she is not using this because it has not helped in the past. Patient wants an antibiotic.

## 2012-11-18 NOTE — Telephone Encounter (Signed)
Patient was seen recently and given an inhaler and cough rx but they are not working. Would like someone to call her back. Uses [facility] at [address]. Cb# is [phone].

## 2013-01-20 ENCOUNTER — Other Ambulatory Visit: Payer: Self-pay

## 2013-02-16 ENCOUNTER — Ambulatory Visit (INDEPENDENT_AMBULATORY_CARE_PROVIDER_SITE_OTHER): Payer: BC Managed Care – PPO | Admitting: Internal Medicine

## 2013-02-16 ENCOUNTER — Ambulatory Visit: Payer: BC Managed Care – PPO

## 2013-02-16 VITALS — BP 118/72 | HR 70 | Temp 98.4°F | Resp 18 | Ht 67.5 in | Wt 166.0 lb

## 2013-02-16 DIAGNOSIS — M79675 Pain in left toe(s): Secondary | ICD-10-CM

## 2013-02-16 DIAGNOSIS — S92912A Unspecified fracture of left toe(s), initial encounter for closed fracture: Secondary | ICD-10-CM

## 2013-02-16 DIAGNOSIS — M79609 Pain in unspecified limb: Secondary | ICD-10-CM

## 2013-02-16 DIAGNOSIS — S92919A Unspecified fracture of unspecified toe(s), initial encounter for closed fracture: Secondary | ICD-10-CM

## 2013-02-16 NOTE — Progress Notes (Signed)
  Subjective:    Patient ID: Christina [name], female    DOB: 1946-03-06, 67 y.o.   MRN: [id number]  HPI  Patient comes into office with left foot pain Second and third toe swollen and painful  Hit it on a tote a week ago and hasn't gotten better been soaking it in salt didn't get any better from that either. Haven't injured foot before Can't put regular shoes on because of the swelling Pain rate is an 8 when trying to put weight on it  Review of Systems     Objective:   Physical Exam        Assessment & Plan:

## 2013-02-16 NOTE — Progress Notes (Signed)
  Subjective:    Patient ID: Christina [name], female    DOB: [birth year], 67 y.o.   MRN: [id number]  HPIjammed foot and hurt 3rd toe L Hurts to walk  Patient Active Problem List   Diagnosis Date Noted  . HTN (hypertension) 07/23/2012  . Lipid disorder 07/23/2012     Review of Systems Noncontributory    Objective:   Physical Exam BP 118/72  Pulse 70  Temp(Src) 98.4 F (36.9 C) (Oral)  Resp 18  Ht 5' 7.5" (1.715 m)  Wt 166 lb (75.297 kg)  BMI 25.6 kg/m2  SpO2 97% No acute distress The left foot has a swollen tender third toe with tenderness including the MTP joint/there is mild lateral displacement There is mild ecchymoses Metatarsals are intact Ankle intact   [facility] reading (PRIMARY) by  Dr. Doolittle=Fx 3rd toe prox phalanx involving joint      Assessment & Plan:  Pain foot secondary to fracture of toe  Buddy taped to the second toe to provide stability in correct alignment leave in place continuously until recheck in one week Postop shoe

## 2013-02-16 NOTE — Patient Instructions (Signed)
There is fracture of the third involves the joint Taping to the second toe will straighten this injury The postop  may be used for comfort Recheck of fracture in our office at 12noon on Tuesday 9th

## 2013-02-23 ENCOUNTER — Ambulatory Visit (INDEPENDENT_AMBULATORY_CARE_PROVIDER_SITE_OTHER): Payer: BC Managed Care – PPO | Admitting: Internal Medicine

## 2013-02-23 VITALS — BP 130/82 | HR 77 | Temp 98.0°F | Resp 18 | Wt 165.0 lb

## 2013-02-23 DIAGNOSIS — M79609 Pain in unspecified limb: Secondary | ICD-10-CM

## 2013-02-23 DIAGNOSIS — Z23 Encounter for immunization: Secondary | ICD-10-CM

## 2013-02-23 DIAGNOSIS — S92912D Unspecified fracture of left toe(s), subsequent encounter for fracture with routine healing: Secondary | ICD-10-CM

## 2013-02-23 NOTE — Progress Notes (Signed)
  Subjective:    Patient ID: Christina [name], female    DOB: 1946-01-05, 67 y.o.   MRN: [id number]  HPI  Toe feels much better. Very little pain. Has not required pain medication, does not disrupt her sleep.Decreased swelling, has not had any skin breakdown. Has been changing dressing every other day. Improved range of motion.  Review of Systems     Objective:   Physical Exam  Pleasant female in NAD. Second and third toes buddy dressing removed. No swelling. Third toe with 80% ROM compared to second toe. Foot warm, good DP/PT pulses. Retaped.      Assessment & Plan:  Toe fracture, left, with routine healing, subsequent encounter  Continue to buddy tape for comfort. Advance as pain free Flu shot today.  I have examined her, reviewed and agree with documentation. Robert P. Merla Riches, M.D.

## 2013-05-17 ENCOUNTER — Other Ambulatory Visit: Payer: Self-pay | Admitting: Family Medicine

## 2013-06-23 ENCOUNTER — Ambulatory Visit (INDEPENDENT_AMBULATORY_CARE_PROVIDER_SITE_OTHER): Payer: BC Managed Care – PPO | Admitting: Family Medicine

## 2013-06-23 VITALS — BP 152/90 | HR 77 | Temp 98.8°F | Resp 18 | Wt 164.0 lb

## 2013-06-23 DIAGNOSIS — K219 Gastro-esophageal reflux disease without esophagitis: Secondary | ICD-10-CM

## 2013-06-23 DIAGNOSIS — E785 Hyperlipidemia, unspecified: Secondary | ICD-10-CM | POA: Insufficient documentation

## 2013-06-23 DIAGNOSIS — I10 Essential (primary) hypertension: Secondary | ICD-10-CM

## 2013-06-23 LAB — COMPREHENSIVE METABOLIC PANEL
ALK PHOS: 75 U/L (ref 39–117)
ALT: 9 U/L (ref 0–35)
AST: 13 U/L (ref 0–37)
Albumin: 4 g/dL (ref 3.5–5.2)
BILIRUBIN TOTAL: 0.6 mg/dL (ref 0.3–1.2)
BUN: 14 mg/dL (ref 6–23)
CALCIUM: 9.5 mg/dL (ref 8.4–10.5)
CHLORIDE: 104 meq/L (ref 96–112)
CO2: 27 mEq/L (ref 19–32)
CREATININE: 0.72 mg/dL (ref 0.50–1.10)
Glucose, Bld: 77 mg/dL (ref 70–99)
Potassium: 4.6 mEq/L (ref 3.5–5.3)
Sodium: 140 mEq/L (ref 135–145)
Total Protein: 6.6 g/dL (ref 6.0–8.3)

## 2013-06-23 LAB — LIPID PANEL
CHOL/HDL RATIO: 4 ratio
Cholesterol: 217 mg/dL — ABNORMAL HIGH (ref 0–200)
HDL: 54 mg/dL (ref >39)
LDL CALC: 146 mg/dL — AB (ref 0–99)
Triglycerides: 87 mg/dL (ref <150)
VLDL: 17 mg/dL (ref 0–40)

## 2013-06-23 MED ORDER — ESOMEPRAZOLE MAGNESIUM 40 MG PO CPDR: 40.0000 mg | DELAYED_RELEASE_CAPSULE | Freq: Every day | ORAL | Status: DC

## 2013-06-23 MED ORDER — AMLODIPINE BESYLATE 5 MG PO TABS: 5.0000 mg | ORAL_TABLET | Freq: Every day | ORAL | Status: DC

## 2013-06-23 MED ORDER — SIMVASTATIN 20 MG PO TABS: 20.0000 mg | ORAL_TABLET | Freq: Every day | ORAL | Status: DC

## 2013-06-23 MED ORDER — TRANDOLAPRIL 4 MG PO TABS: 4.0000 mg | ORAL_TABLET | Freq: Every day | ORAL | Status: DC

## 2013-06-23 NOTE — Patient Instructions (Signed)
Take your medications faithfully.  I will let you know the results of your labs in the next week or so. Continue the same medications unless told to do otherwise.  It is important that you always return before urine out of your medications. Your blood pressure is a little high today, probably because the medications have run out. You should check your blood pressure elsewhere and if it is running greater than 140/90 come back in because we may need to recheck things and adjust doses.

## 2013-06-23 NOTE — Progress Notes (Signed)
Subjective: Patient is here for a recheck of her blood pressure and cholesterol. She is out of her medications. She ran out a couple of weeks ago. I talked to her about the need to always come in before the medicines ran out. She denies any other major symptoms. She usually takes her meds regularly. 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 Reflux still bothers her some  Objective: Smells of cigarette smoke. TMs normal. Throat clear. Neck supple without nodes. No carotid bruits. Chest clear. Heart regular without murmurs.  Assessment: Hypertension Hyperlipidemia GERD  Plan: Refill her medications. Come back in a year which seems to be her previously done schedule. I will let her know the results of the labs.

## 2013-06-25 ENCOUNTER — Telehealth: Payer: Self-pay

## 2013-06-25 NOTE — Telephone Encounter (Signed)
PA needed for Nexium. Pt verified that she has tried prilosec, tagament, zantac and pepcid in the past. Has been stable on Nexium since before 2007. Did not hear back from ins, but Dr Linna Darner changed pt's Rx to protonix.

## 2013-06-28 ENCOUNTER — Encounter: Payer: Self-pay | Admitting: Family Medicine

## 2013-08-03 ENCOUNTER — Ambulatory Visit (INDEPENDENT_AMBULATORY_CARE_PROVIDER_SITE_OTHER): Payer: BC Managed Care – PPO | Admitting: Family Medicine

## 2013-08-03 VITALS — BP 130/86 | HR 93 | Temp 98.2°F | Resp 16 | Ht 68.5 in | Wt 163.8 lb

## 2013-08-03 DIAGNOSIS — R05 Cough: Secondary | ICD-10-CM

## 2013-08-03 DIAGNOSIS — J069 Acute upper respiratory infection, unspecified: Secondary | ICD-10-CM

## 2013-08-03 DIAGNOSIS — R059 Cough, unspecified: Secondary | ICD-10-CM

## 2013-08-03 DIAGNOSIS — K219 Gastro-esophageal reflux disease without esophagitis: Secondary | ICD-10-CM

## 2013-08-03 DIAGNOSIS — J45909 Unspecified asthma, uncomplicated: Secondary | ICD-10-CM

## 2013-08-03 MED ORDER — AZITHROMYCIN 250 MG PO TABS: ORAL_TABLET | ORAL | Status: DC

## 2013-08-03 MED ORDER — HYDROCODONE-HOMATROPINE 5-1.5 MG/5ML PO SYRP: 5.0000 mL | ORAL_SOLUTION | ORAL | Status: DC | PRN

## 2013-08-03 MED ORDER — IPRATROPIUM BROMIDE 0.03 % NA SOLN: NASAL | Status: DC

## 2013-08-03 MED ORDER — ALBUTEROL SULFATE HFA 108 (90 BASE) MCG/ACT IN AERS: INHALATION_SPRAY | RESPIRATORY_TRACT | Status: DC

## 2013-08-03 MED ORDER — PANTOPRAZOLE SODIUM 40 MG PO TBEC: 40.0000 mg | DELAYED_RELEASE_TABLET | Freq: Every day | ORAL | Status: DC

## 2013-08-03 NOTE — Progress Notes (Signed)
Subjective: 68 year old lady who was here a month ago for a regular visit. At that time I saw her. Her labs were good except for the cholesterol which was a little high, which was expected since she was out of medicines at the time. She is back on her regular medicines now.  Patient seems to have some trouble getting her Nexium filled. We will make a change to Protonix.  For about 5 or 6 days patient has been getting sick with a respiratory tract infection. Her head is being congested with some clear nasal drainage. She has not been feverish. Her ears and throat are okay. She is coughing a lot and has gotten wheezy. She does not smoke. She has used some over-the-counter Coricidin  Objective: TMs are normal. Her throat is clear. Neck supple without significant nodes. Chest has no rales or rhonchi but has a soft diffuse wheeze which is made worse by forced expiration. Heart regular without murmurs.  Assessment: Upper respiratory infection and asthmatic bronchitis GERD  Plan: Albuterol inhaler Atrovent nasal spray for head congestion Azithromycin  Protonix  Return if worse

## 2013-08-03 NOTE — Patient Instructions (Addendum)
Drink plenty of fluids and get enough rest  Use the inhaler 2 inhalations about every 4-6 hours as needed for wheezing  Use the nasal spray 2 sprays each nostril up to 4 times daily if needed for head congestion  Take the azithromycin antibiotic 2 tablets initially then one daily for 4 days  Use the cough syrup 1 teaspoon every 4-6 hours if needed for bad coughing  Return if worse   Take Protonix 1 Daily in Pl. of the Nexium.

## 2013-08-10 ENCOUNTER — Ambulatory Visit: Payer: BC Managed Care – PPO

## 2013-08-10 ENCOUNTER — Ambulatory Visit (INDEPENDENT_AMBULATORY_CARE_PROVIDER_SITE_OTHER): Payer: BC Managed Care – PPO | Admitting: Family Medicine

## 2013-08-10 VITALS — BP 146/78 | HR 66 | Temp 98.7°F | Resp 18 | Ht 67.0 in | Wt 163.6 lb

## 2013-08-10 DIAGNOSIS — I10 Essential (primary) hypertension: Secondary | ICD-10-CM

## 2013-08-10 DIAGNOSIS — E785 Hyperlipidemia, unspecified: Secondary | ICD-10-CM

## 2013-08-10 DIAGNOSIS — R1013 Epigastric pain: Secondary | ICD-10-CM

## 2013-08-10 DIAGNOSIS — K219 Gastro-esophageal reflux disease without esophagitis: Secondary | ICD-10-CM

## 2013-08-10 LAB — POCT CBC
Granulocyte percent: 43.9 %G (ref 37–80)
HEMATOCRIT: 44.3 % (ref 37.7–47.9)
HEMOGLOBIN: 13.9 g/dL (ref 12.2–16.2)
Lymph, poc: 4.4 — AB (ref 0.6–3.4)
MCH: 29.4 pg (ref 27–31.2)
MCHC: 31.4 g/dL — AB (ref 31.8–35.4)
MCV: 93.9 fL (ref 80–97)
MID (cbc): 0.7 (ref 0–0.9)
MPV: 10 fL (ref 0–99.8)
POC Granulocyte: 4 (ref 2–6.9)
POC LYMPH PERCENT: 48.5 %L (ref 10–50)
POC MID %: 7.6 %M (ref 0–12)
Platelet Count, POC: 358 10*3/uL (ref 142–424)
RBC: 4.72 M/uL (ref 4.04–5.48)
RDW, POC: 15 %
WBC: 9 10*3/uL (ref 4.6–10.2)

## 2013-08-10 LAB — POCT URINALYSIS DIPSTICK
BILIRUBIN UA: NEGATIVE
GLUCOSE UA: NEGATIVE
Ketones, UA: NEGATIVE
Leukocytes, UA: NEGATIVE
NITRITE UA: NEGATIVE
Protein, UA: NEGATIVE
RBC UA: NEGATIVE
SPEC GRAV UA: 1.01
Urobilinogen, UA: 0.2
pH, UA: 5.5

## 2013-08-10 LAB — POCT UA - MICROSCOPIC ONLY
CRYSTALS, UR, HPF, POC: NEGATIVE
Casts, Ur, LPF, POC: NEGATIVE
Mucus, UA: NEGATIVE
Yeast, UA: NEGATIVE

## 2013-08-10 MED ORDER — SUCRALFATE 1 GM/10ML PO SUSP: 1.0000 g | Freq: Three times a day (TID) | ORAL | Status: DC

## 2013-08-10 NOTE — Patient Instructions (Signed)
Gastroesophageal Reflux Disease, Adult  Gastroesophageal reflux disease (GERD) happens when acid from your stomach flows up into the esophagus. When acid comes in contact with the esophagus, the acid causes soreness (inflammation) in the esophagus. Over time, GERD may create small holes (ulcers) in the lining of the esophagus.  CAUSES   · Increased body weight. This puts pressure on the stomach, making acid rise from the stomach into the esophagus.  · Smoking. This increases acid production in the stomach.  · Drinking alcohol. This causes decreased pressure in the lower esophageal sphincter (valve or ring of muscle between the esophagus and stomach), allowing acid from the stomach into the esophagus.  · Late evening meals and a full stomach. This increases pressure and acid production in the stomach.  · A malformed lower esophageal sphincter.  Sometimes, no cause is found.  SYMPTOMS   · Burning pain in the lower part of the mid-chest behind the breastbone and in the mid-stomach area. This may occur twice a week or more often.  · Trouble swallowing.  · Sore throat.  · Dry cough.  · Asthma-like symptoms including chest tightness, shortness of breath, or wheezing.  DIAGNOSIS   Your caregiver may be able to diagnose GERD based on your symptoms. In some cases, X-rays and other tests may be done to check for complications or to check the condition of your stomach and esophagus.  TREATMENT   Your caregiver may recommend over-the-counter or prescription medicines to help decrease acid production. Ask your caregiver before starting or adding any new medicines.   HOME CARE INSTRUCTIONS   · Change the factors that you can control. Ask your caregiver for guidance concerning weight loss, quitting smoking, and alcohol consumption.  · Avoid foods and drinks that make your symptoms worse, such as:  · Caffeine or alcoholic drinks.  · Chocolate.  · Peppermint or mint flavorings.  · Garlic and onions.  · Spicy foods.  · Citrus fruits,  such as oranges, lemons, or limes.  · Tomato-based foods such as sauce, chili, salsa, and pizza.  · Fried and fatty foods.  · Avoid lying down for the 3 hours prior to your bedtime or prior to taking a nap.  · Eat small, frequent meals instead of large meals.  · Wear loose-fitting clothing. Do not wear anything tight around your waist that causes pressure on your stomach.  · Raise the head of your bed 6 to 8 inches with wood blocks to help you sleep. Extra pillows will not help.  · Only take over-the-counter or prescription medicines for pain, discomfort, or fever as directed by your caregiver.  · Do not take aspirin, ibuprofen, or other nonsteroidal anti-inflammatory drugs (NSAIDs).  SEEK IMMEDIATE MEDICAL CARE IF:   · You have pain in your arms, neck, jaw, teeth, or back.  · Your pain increases or changes in intensity or duration.  · You develop nausea, vomiting, or sweating (diaphoresis).  · You develop shortness of breath, or you faint.  · Your vomit is green, yellow, black, or looks like coffee grounds or blood.  · Your stool is red, bloody, or black.  These symptoms could be signs of other problems, such as heart disease, gastric bleeding, or esophageal bleeding.  MAKE SURE YOU:   · Understand these instructions.  · Will watch your condition.  · Will get help right away if you are not doing well or get worse.  Document Released: 03/13/2005 Document Revised: 08/26/2011 Document Reviewed: 12/21/2010  [employer]® Patient   Information ©2014 [employer].

## 2013-08-10 NOTE — Progress Notes (Addendum)
Subjective:  This chart was scribed for Christina Forts, MD by Donato Schultz, Medical Scribe. This patient was seen in Room 5 and the patient's care was started at 7:00 PM.   Patient ID: Christina [name], female    DOB: Apr 01, 1946, 68 y.o.   MRN: [id number]  HPI HPI Comments: Christina [name] is a 68 y.o. female with a history of Hypertension and Lipid disorder who presents to the [facility] complaining of burning gastrophageal reflux and abdominal pain LUQ that started today at noon after the patient ate.  The patient states that the pain lasted for about an hour.  She denies being in pain currently.  She states that she has not burped more than she normally does.  The patient denies melena, hematochezia, chest pain, SOB, sore throat, diaphoresis, nausea, vomiting, diarrhea as associated symptoms.  She states that her appetite is fine when she is able to eat without feeling any pain.  The patient states that she quit smoking a couple of weeks ago.  She denies any alcohol use.  The patient states that she does not eat spicy foods or take Ibuprofen or other NSAIDs.  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    The patient was last seen a week ago by Dr. Linna Darner and was diagnosed with Asthmatic Bronchitis, URI, and GERD.  She was discharged with a prescription for an albuterol inhaler, atrovent, azithromycin, and protonix.  The patient states that she has been using her inhaler that she was prescribed and has experienced relief in her wheezing and cough.     Review of Systems  Constitutional: Negative for chills, diaphoresis, appetite change and fatigue.  HENT: Negative for congestion, postnasal drip, rhinorrhea and sore throat.   Respiratory: Negative for cough, shortness of breath and wheezing.   Cardiovascular: Negative for chest pain, palpitations and leg swelling.  Gastrointestinal: Positive for abdominal pain. Negative for  nausea, vomiting, diarrhea and blood in stool.  Genitourinary: Negative for dysuria, urgency, frequency and hematuria.  Skin: Negative for rash.   Past Medical History  Diagnosis Date   Reflux    Hypertension    Hyperlipidemia    GERD (gastroesophageal reflux disease)    History reviewed. No pertinent past surgical history. Allergies  Allergen Reactions   Codeine Other (See Comments)    Upsets stomach really bad   Current outpatient prescriptions:albuterol (PROVENTIL HFA;VENTOLIN HFA) 108 (90 BASE) MCG/ACT inhaler, Use 2 inhalations every 4-6 hours as needed for cough wheezing, Disp: 1 Inhaler, Rfl: 1;  amLODipine (NORVASC) 5 MG tablet, Take 1 tablet (5 mg total) by mouth daily., Disp: 30 tablet, Rfl: 11;  EPINEPHrine (EPIPEN) 0.3 mg/0.3 mL DEVI, Inject 0.3 mLs (0.3 mg total) into the muscle once., Disp: 1 Device, Rfl: 5 pantoprazole (PROTONIX) 40 MG tablet, Take 1 tablet (40 mg total) by mouth daily., Disp: 30 tablet, Rfl: 5;  simvastatin (ZOCOR) 20 MG tablet, Take 1 tablet (20 mg total) by mouth daily at 6 PM., Disp: 30 tablet, Rfl: 11;  trandolapril (MAVIK) 4 MG tablet, Take 1 tablet (4 mg total) by mouth daily., Disp: 30 tablet, Rfl: 5;  azithromycin (ZITHROMAX) 250 MG tablet, Take 2 pills initially, then one daily for infection, Disp: 6 tablet, Rfl: 0 esomeprazole (NEXIUM) 40 MG capsule, Take 1 capsule (40 mg total) by mouth daily before breakfast., Disp: 90 capsule, Rfl: 1;  HYDROcodone-homatropine (HYCODAN) 5-1.5 MG/5ML syrup, Take 5 mLs by mouth every  4 (four) hours as needed for cough., Disp: 120 mL, Rfl: 0;  ipratropium (ATROVENT) 0.03 % nasal spray, Use 2 sprays each nostril up to 4 times daily as needed for head congestion, Disp: 30 mL, Rfl: 0 sucralfate (CARAFATE) 1 GM/10ML suspension, Take 10 mLs (1 g total) by mouth 4 (four) times daily -  with meals and at bedtime., Disp: 420 mL, Rfl: 0  Objective:  Physical Exam  Nursing note and vitals reviewed. Constitutional: She is  oriented to person, place, and time. She appears well-developed and well-nourished. No distress.  HENT:  Head: Normocephalic and atraumatic.  Right Ear: External ear normal.  Left Ear: External ear normal.  Mouth/Throat: Oropharynx is clear and moist. No oropharyngeal exudate.  Eyes: Conjunctivae and EOM are normal. Pupils are equal, round, and reactive to light.  Neck: Normal range of motion. Neck supple. No thyromegaly present.  Cardiovascular: Normal rate, regular rhythm and normal heart sounds.  Exam reveals no gallop and no friction rub.   No murmur heard. Pulmonary/Chest: Effort normal and breath sounds normal. No respiratory distress. She has no wheezes. She has no rales.  Abdominal: Soft. Bowel sounds are normal. She exhibits no distension and no mass. There is no hepatosplenomegaly. There is tenderness in the epigastric area. There is no rigidity, no rebound and no guarding. No hernia.  Mild tenderness to palpation over epigastric region.    Musculoskeletal: Normal range of motion.  Lymphadenopathy:    She has no cervical adenopathy.  Neurological: She is alert and oriented to person, place, and time.  Skin: Skin is warm and dry. She is not diaphoretic.  Psychiatric: She has a normal mood and affect. Her behavior is normal.    Results for orders placed in visit on 08/10/13  POCT CBC      Result Value Ref Range   WBC 9.0  4.6 - 10.2 K/uL   Lymph, poc 4.4 (*) 0.6 - 3.4   POC LYMPH PERCENT 48.5  10 - 50 %L   MID (cbc) 0.7  0 - 0.9   POC MID % 7.6  0 - 12 %M   POC Granulocyte 4.0  2 - 6.9   Granulocyte percent 43.9  37 - 80 %G   RBC 4.72  4.04 - 5.48 M/uL   Hemoglobin 13.9  12.2 - 16.2 g/dL   HCT, POC 44.3  37.7 - 47.9 %   MCV 93.9  80 - 97 fL   MCH, POC 29.4  27 - 31.2 pg   MCHC 31.4 (*) 31.8 - 35.4 g/dL   RDW, POC 15.0     Platelet Count, POC 358  142 - 424 K/uL   MPV 10.0  0 - 99.8 fL  POCT URINALYSIS DIPSTICK      Result Value Ref Range   Color, UA yellow      Clarity, UA clear     Glucose, UA neg     Bilirubin, UA neg     Ketones, UA neg'     Spec Grav, UA 1.010     Blood, UA neg     pH, UA 5.5     Protein, UA neg     Urobilinogen, UA 0.2     Nitrite, UA neg     Leukocytes, UA Negative    POCT UA - MICROSCOPIC ONLY      Result Value Ref Range   WBC, Ur, HPF, POC 2-3     RBC, urine, microscopic 1-2     Bacteria, U  Microscopic trace     Mucus, UA neg     Epithelial cells, urine per micros 0-2     Crystals, Ur, HPF, POC neg     Casts, Ur, LPF, POC neg     Yeast, UA neg     EKG: NSR; no acute changes  [facility] reading (PRIMARY) by  Dr. Tamala Julian. CXR:  NAD  BP 146/78   Pulse 66   Temp(Src) 98.7 F (37.1 C) (Oral)   Resp 18   Ht 5\' 7"  (1.702 m)   Wt 163 lb 9.6 oz (74.208 kg)   BMI 25.62 kg/m2   SpO2 99% Assessment & Plan:   Abdominal pain, epigastric - Plan: POCT CBC, POCT urinalysis dipstick, POCT UA - Microscopic Only, Comprehensive metabolic panel, DG Chest 2 View, EKG 12-Lead  HTN (hypertension) - Plan: EKG 12-Lead  Other and unspecified hyperlipidemia  GERD (gastroesophageal reflux disease)  1. Abdominal pain epigastric:  New.  Onset after recent Zithromax for acute sinusitis/bronchitis.  Continue Protonix daily; rx for Carafate to use qac, qhs for the next one week PRN.  RTC for acute worsening.  EKG stable; normal CXR.  Obtain labs.  Ddx includes gastritis, PUD, GERD, early pancreatitis.  Recommend BRAT diet. 2.  GERD: worsening with recent antibiotic use. Continue rx for Protonix. Rx for Carafate provided to use for the next 1-2 weeks.  RTC if worsens or persists. 3. HTN: stable.   4.  Hyperlipidemia: stable.  Meds ordered this encounter  Medications   sucralfate (CARAFATE) 1 GM/10ML suspension    Sig: Take 10 mLs (1 g total) by mouth 4 (four) times daily -  with meals and at bedtime.    Dispense:  420 mL    Refill:  0    I personally performed the services described in this documentation, which was scribed in my presence.  The recorded information has been reviewed and is accurate.  Christina [name], M.D.  [facility] [address] [place], [place]  [address] [phone] phone [phone] fax

## 2013-08-11 LAB — COMPREHENSIVE METABOLIC PANEL
ALBUMIN: 4.1 g/dL (ref 3.5–5.2)
ALT: <8 U/L (ref 0–35)
AST: 13 U/L (ref 0–37)
Alkaline Phosphatase: 71 U/L (ref 39–117)
BUN: 15 mg/dL (ref 6–23)
CO2: 31 mEq/L (ref 19–32)
Calcium: 9.8 mg/dL (ref 8.4–10.5)
Chloride: 105 mEq/L (ref 96–112)
Creat: 0.91 mg/dL (ref 0.50–1.10)
GLUCOSE: 81 mg/dL (ref 70–99)
POTASSIUM: 4.6 meq/L (ref 3.5–5.3)
Sodium: 142 mEq/L (ref 135–145)
Total Bilirubin: 0.3 mg/dL (ref 0.2–1.2)
Total Protein: 6.7 g/dL (ref 6.0–8.3)

## 2013-08-12 ENCOUNTER — Encounter: Payer: Self-pay | Admitting: Family Medicine

## 2013-08-16 ENCOUNTER — Telehealth: Payer: Self-pay | Admitting: *Deleted

## 2013-08-16 MED ORDER — DM-GUAIFENESIN ER 30-600 MG PO TB12: 1.0000 | ORAL_TABLET | Freq: Two times a day (BID) | ORAL | Status: DC

## 2013-08-16 NOTE — Telephone Encounter (Signed)
Patient notified and voiced understanding.

## 2013-08-16 NOTE — Telephone Encounter (Signed)
Call --- sent rx for Mucinex DM to pharmacy to take twice daily for chest congestion.

## 2013-08-16 NOTE — Telephone Encounter (Signed)
is there anything else that you can prescribe for me to take to get rid of the rest of this conjestion out of my chest that will not will my bother my stomach because I can still feel some of the cold in there

## 2013-08-20 ENCOUNTER — Telehealth: Payer: Self-pay

## 2013-08-20 NOTE — Telephone Encounter (Signed)
Patient states that she is not feeling any better. Wants to know if she can have something else called in. [facility] on [address] and [address]  [phone]

## 2013-08-23 NOTE — Telephone Encounter (Signed)
Pt is going to RTC for evaluation- it has been almost 4 weeks with the cough and chest congestion.

## 2013-08-26 ENCOUNTER — Ambulatory Visit (INDEPENDENT_AMBULATORY_CARE_PROVIDER_SITE_OTHER): Payer: BC Managed Care – PPO | Admitting: Emergency Medicine

## 2013-08-26 VITALS — BP 124/82 | HR 80 | Temp 98.2°F | Resp 16 | Ht 67.0 in | Wt 161.0 lb

## 2013-08-26 DIAGNOSIS — R059 Cough, unspecified: Secondary | ICD-10-CM

## 2013-08-26 DIAGNOSIS — R05 Cough: Secondary | ICD-10-CM

## 2013-08-26 DIAGNOSIS — R062 Wheezing: Secondary | ICD-10-CM

## 2013-08-26 MED ORDER — ALBUTEROL SULFATE (2.5 MG/3ML) 0.083% IN NEBU
2.5000 mg | INHALATION_SOLUTION | Freq: Once | RESPIRATORY_TRACT | Status: AC
  Administered 2013-08-26: 2.5 mg via RESPIRATORY_TRACT

## 2013-08-26 MED ORDER — PREDNISONE 10 MG PO TABS: ORAL_TABLET | ORAL | Status: DC

## 2013-08-26 MED ORDER — AMOXICILLIN 875 MG PO TABS: 875.0000 mg | ORAL_TABLET | Freq: Two times a day (BID) | ORAL | Status: DC

## 2013-08-26 NOTE — Patient Instructions (Signed)
Continue to use her inhaler. You have prescription for prednisone and amoxicillin to pick up at the pharmacy

## 2013-08-26 NOTE — Progress Notes (Addendum)
   Subjective:   Patient ID: Christina [name], female    DOB: 13-Sep-1945, 68 y.o.   MRN: [id number] This chart was scribed for Christina Queen, MD by Terressa Koyanagi, ED Scribe. This patient was seen in room 9 and the patient's care was started at 11:10AM.    PCP:  Kennon Portela, MD  HPI HPI Comments: Christina [name] is a 68 y.o. female, with a history of HTN, lipid disorder, GERD and unspecified HLD,  who presents to the [facility] complaining of a persistent cough onset over a month ago. Pt reports that she is producing, occasional yellow sputum when coughing. Pt also complains of associated wheezing. Pt reports she is a current smoker. Pt states she is trying to quit smoking; and has been smoking 1-2 cigarettes per day for the past month. Pt reports that one month ago, she smoked 1 ppd.   Pt was seen at this facility a few weeks ago for cough and congestion. Pt was discharged with a prescription for an albuterol inhaler, atrovent, azithromycin, and protonix. Pt reports that soon after her appointment, she contacted this facility to complain that her cough and congestion was not improving and request different meds. Pt reports that she was then prescribed Mucinex DM. Pt reports the Mucinex DM has not improved her symptoms.    Review of Systems  Constitutional: Negative for fatigue and unexpected weight change.  Respiratory: Positive for cough (with occasional, production of yellow sputum ) and wheezing. Negative for chest tightness and shortness of breath.   Cardiovascular: Negative for chest pain, palpitations and leg swelling.  Gastrointestinal: Negative for abdominal pain and blood in stool.  Neurological: Negative for dizziness, syncope, light-headedness and headaches.   Objective:  Physical Exam  Nursing note and vitals reviewed. CONSTITUTIONAL: Well developed/well nourished HEAD: Normocephalic/atraumatic EYES: EOMI/PERRL ENMT: Mucous membranes moist NECK: supple  no meningeal signs SPINE:entire spine nontender CV: S1/S2 noted, no murmurs/rubs/gallops noted LUNGS: Lungs are clear to auscultation bilaterally, no apparent distress; wheezing on expiration  ABDOMEN: soft, nontender, no rebound or guarding GU:no cva tenderness NEURO: Pt is awake/alert, moves all extremitiesx4 EXTREMITIES: pulses normal, full ROM SKIN: warm, color normal PSYCH: no abnormalities of mood noted  Filed Vitals:   08/26/13 1019  BP: 124/82  Pulse: 80  Temp: 98.2 F (36.8 C)  TempSrc: Oral  Resp: 16  Height: 5\' 7"  (1.702 m)  Weight: 161 lb (73.029 kg)  SpO2: 97%   Assessment & Plan:   Patient improved after nebulizer treatment. I suspect she has some degree of reactive airways disease and possible COPD related to her long history of smoking. Will treat with a prednisone taper and cover with amoxicillin because of her discolored sputum. Recent chest x-ray was no acute disease .. I personally performed the services described in this documentation, which was scribed in my presence. The recorded information has been reviewed and is accurate.

## 2013-09-08 ENCOUNTER — Other Ambulatory Visit: Payer: Self-pay | Admitting: Family Medicine

## 2013-09-09 ENCOUNTER — Other Ambulatory Visit: Payer: Self-pay | Admitting: Family Medicine

## 2013-11-02 ENCOUNTER — Other Ambulatory Visit: Payer: Self-pay

## 2013-11-02 DIAGNOSIS — K219 Gastro-esophageal reflux disease without esophagitis: Secondary | ICD-10-CM

## 2013-11-02 MED ORDER — ESOMEPRAZOLE MAGNESIUM 40 MG PO CPDR: 40.0000 mg | DELAYED_RELEASE_CAPSULE | Freq: Every day | ORAL | Status: DC

## 2014-01-12 ENCOUNTER — Other Ambulatory Visit: Payer: Self-pay | Admitting: Family Medicine

## 2014-02-14 ENCOUNTER — Ambulatory Visit (INDEPENDENT_AMBULATORY_CARE_PROVIDER_SITE_OTHER): Payer: BC Managed Care – PPO | Admitting: Internal Medicine

## 2014-02-14 VITALS — BP 170/82 | HR 64 | Temp 97.8°F | Resp 16 | Ht 68.0 in | Wt 161.0 lb

## 2014-02-14 DIAGNOSIS — I10 Essential (primary) hypertension: Secondary | ICD-10-CM

## 2014-02-14 DIAGNOSIS — K219 Gastro-esophageal reflux disease without esophagitis: Secondary | ICD-10-CM

## 2014-02-14 DIAGNOSIS — E785 Hyperlipidemia, unspecified: Secondary | ICD-10-CM

## 2014-02-14 DIAGNOSIS — Z0000 Encounter for general adult medical examination without abnormal findings: Secondary | ICD-10-CM

## 2014-02-14 DIAGNOSIS — Z1239 Encounter for other screening for malignant neoplasm of breast: Secondary | ICD-10-CM

## 2014-02-14 DIAGNOSIS — Z23 Encounter for immunization: Secondary | ICD-10-CM

## 2014-02-14 DIAGNOSIS — Z1211 Encounter for screening for malignant neoplasm of colon: Secondary | ICD-10-CM

## 2014-02-14 DIAGNOSIS — Z79899 Other long term (current) drug therapy: Secondary | ICD-10-CM

## 2014-02-14 LAB — COMPREHENSIVE METABOLIC PANEL
ALT: 10 U/L (ref 0–35)
AST: 15 U/L (ref 0–37)
Albumin: 4.2 g/dL (ref 3.5–5.2)
Alkaline Phosphatase: 67 U/L (ref 39–117)
BUN: 13 mg/dL (ref 6–23)
CO2: 31 mEq/L (ref 19–32)
Calcium: 10 mg/dL (ref 8.4–10.5)
Chloride: 105 mEq/L (ref 96–112)
Creat: 0.86 mg/dL (ref 0.50–1.10)
Glucose, Bld: 85 mg/dL (ref 70–99)
Potassium: 4.5 mEq/L (ref 3.5–5.3)
Sodium: 141 mEq/L (ref 135–145)
Total Bilirubin: 0.7 mg/dL (ref 0.2–1.2)
Total Protein: 6.8 g/dL (ref 6.0–8.3)

## 2014-02-14 LAB — POCT URINALYSIS DIPSTICK
Bilirubin, UA: NEGATIVE
Glucose, UA: NEGATIVE
KETONES UA: NEGATIVE
Leukocytes, UA: NEGATIVE
Nitrite, UA: NEGATIVE
Protein, UA: NEGATIVE
RBC UA: NEGATIVE
Spec Grav, UA: <=1.005
UROBILINOGEN UA: 0.2
pH, UA: 5.5

## 2014-02-14 LAB — POCT CBC
GRANULOCYTE PERCENT: 47.6 % (ref 37–80)
HCT, POC: 42.9 % (ref 37.7–47.9)
HEMOGLOBIN: 13.4 g/dL (ref 12.2–16.2)
Lymph, poc: 3.2 (ref 0.6–3.4)
MCH, POC: 28.8 pg (ref 27–31.2)
MCHC: 31.3 g/dL — AB (ref 31.8–35.4)
MCV: 92 fL (ref 80–97)
MID (CBC): 0.3 (ref 0–0.9)
MPV: 8.6 fL (ref 0–99.8)
PLATELET COUNT, POC: 300 10*3/uL (ref 142–424)
POC Granulocyte: 3.1 (ref 2–6.9)
POC LYMPH PERCENT: 48.2 %L (ref 10–50)
POC MID %: 4.2 % (ref 0–12)
RBC: 4.66 M/uL (ref 4.04–5.48)
RDW, POC: 16.8 %
WBC: 6.6 10*3/uL (ref 4.6–10.2)

## 2014-02-14 LAB — TSH: TSH: 0.633 u[IU]/mL (ref 0.350–4.500)

## 2014-02-14 LAB — LIPID PANEL
Cholesterol: 185 mg/dL (ref 0–200)
HDL: 59 mg/dL (ref >39)
LDL CALC: 107 mg/dL — AB (ref 0–99)
Total CHOL/HDL Ratio: 3.1 Ratio
Triglycerides: 93 mg/dL (ref <150)
VLDL: 19 mg/dL (ref 0–40)

## 2014-02-14 LAB — POCT UA - MICROSCOPIC ONLY
BACTERIA, U MICROSCOPIC: NEGATIVE
CASTS, UR, LPF, POC: NEGATIVE
Crystals, Ur, HPF, POC: NEGATIVE
Mucus, UA: NEGATIVE
RBC, urine, microscopic: NEGATIVE
WBC, Ur, HPF, POC: NEGATIVE
Yeast, UA: NEGATIVE

## 2014-02-14 MED ORDER — SIMVASTATIN 20 MG PO TABS: 20.0000 mg | ORAL_TABLET | Freq: Every day | ORAL | Status: DC

## 2014-02-14 MED ORDER — AMLODIPINE BESYLATE 5 MG PO TABS: 5.0000 mg | ORAL_TABLET | Freq: Every day | ORAL | Status: DC

## 2014-02-14 MED ORDER — TRANDOLAPRIL 4 MG PO TABS: 4.0000 mg | ORAL_TABLET | Freq: Every day | ORAL | Status: DC

## 2014-02-14 NOTE — Patient Instructions (Addendum)
Come back in 8 weeks for Prevnar vaccination  Immunization Schedule, Adult  Influenza vaccine.  All adults should be immunized every year.  All adults, including pregnant women and people with hives-only allergy to eggs can receive the inactivated influenza (IIV) vaccine.  Adults aged 66-49 years can receive the recombinant influenza (RIV) vaccine. The RIV vaccine does not contain any egg protein.  Adults aged 27 years or older can receive the standard-dose IIV or the high-dose IIV.  Tetanus, diphtheria, and acellular pertussis (Td, Tdap) vaccine.  Pregnant women should receive 1 dose of Tdap vaccine during each pregnancy. The dose should be obtained regardless of the length of time since the last dose. Immunization is preferred during the 27th to 36th week of gestation.  An adult who has not previously received Tdap or who does not know his or her vaccine status should receive 1 dose of Tdap. This initial dose should be followed by tetanus and diphtheria toxoids (Td) booster doses every 10 years.  Adults with an unknown or incomplete history of completing a 3-dose immunization series with Td-containing vaccines should begin or complete a primary immunization series including a Tdap dose.  Adults should receive a Td booster every 10 years.  Varicella vaccine.  An adult without evidence of immunity to varicella should receive 2 doses or a second dose if he or she has previously received 1 dose.  Pregnant females who do not have evidence of immunity should receive the first dose after pregnancy. This first dose should be obtained before leaving the health care facility. The second dose should be obtained 4-8 weeks after the first dose.  Human papillomavirus (HPV) vaccine.  Females aged 13-26 years who have not received the vaccine previously should obtain the 3-dose series.  The vaccine is not recommended for use in pregnant females. However, pregnancy testing is not needed before  receiving a dose. If a female is found to be pregnant after receiving a dose, no treatment is needed. In that case, the remaining doses should be delayed until after the pregnancy.  Males aged 60-21 years who have not received the vaccine previously should receive the 3-dose series. Males aged 22-26 years may be immunized.  Immunization is recommended through the age of 59 years for any female who has sex with males and did not get any or all doses earlier.  Immunization is recommended for any person with an immunocompromised condition through the age of 10 years if he or she did not get any or all doses earlier.  During the 3-dose series, the second dose should be obtained 4-8 weeks after the first dose. The third dose should be obtained 24 weeks after the first dose and 16 weeks after the second dose.  Zoster vaccine.  One dose is recommended for adults aged 79 years or older unless certain conditions are present.  Measles, mumps, and rubella (MMR) vaccine.  Adults born before [birth year] generally are considered immune to measles and mumps.  Adults born in [birth year] or later should have 1 or more doses of MMR vaccine unless there is a contraindication to the vaccine or there is laboratory evidence of immunity to each of the three diseases.  A routine second dose of MMR vaccine should be obtained at least 28 days after the first dose for students attending postsecondary schools, health care workers, or international travelers.  People who received inactivated measles vaccine or an unknown type of measles vaccine during 1963-1967 should receive 2 doses of MMR vaccine.  People  who received inactivated mumps vaccine or an unknown type of mumps vaccine before 1979 and are at high risk for mumps infection should consider immunization with 2 doses of MMR vaccine.  For females of childbearing age, rubella immunity should be determined. If there is no evidence of immunity, females who are not pregnant should  be vaccinated. If there is no evidence of immunity, females who are pregnant should delay immunization until after pregnancy.  Unvaccinated health care workers born before 50 who lack laboratory evidence of measles, mumps, or rubella immunity or laboratory confirmation of disease should consider measles and mumps immunization with 2 doses of MMR vaccine or rubella immunization with 1 dose of MMR vaccine.  Pneumococcal 13-valent conjugate (PCV13) vaccine.  When indicated, a person who is uncertain of his or her immunization history and has no record of immunization should receive the PCV13 vaccine.  An adult aged 61 years or older who has certain medical conditions and has not been previously immunized should receive 1 dose of PCV13 vaccine. This PCV13 should be followed with a dose of pneumococcal polysaccharide (PPSV23) vaccine. The PPSV23 vaccine dose should be obtained at least 8 weeks after the dose of PCV13 vaccine.  An adult aged 11 years or older who has certain medical conditions and previously received 1 or more doses of PPSV23 vaccine should receive 1 dose of PCV13. The PCV13 vaccine dose should be obtained 1 or more years after the last PPSV23 vaccine dose.  Pneumococcal polysaccharide (PPSV23) vaccine.  When PCV13 is also indicated, PCV13 should be obtained first.  All adults aged 65 years and older should be immunized.  An adult younger than age 74 years who has certain medical conditions should be immunized.  Any person who resides in a nursing home or long-term care facility should be immunized.  An adult smoker should be immunized.  People with an immunocompromised condition and certain other conditions should receive both PCV13 and PPSV23 vaccines.  People with human immunodeficiency virus (HIV) infection should be immunized as soon as possible after diagnosis.  Immunization during chemotherapy or radiation therapy should be avoided.  Routine use of PPSV23 vaccine is  not recommended for American Indians, [address], or people younger than 65 years unless there are medical conditions that require PPSV23 vaccine.  When indicated, people who have unknown immunization and have no record of immunization should receive PPSV23 vaccine.  One-time revaccination 5 years after the first dose of PPSV23 is recommended for people aged 19-64 years who have chronic kidney failure, nephrotic syndrome, asplenia, or immunocompromised conditions.  People who received 1-2 doses of PPSV23 before age 50 years should receive another dose of PPSV23 vaccine at age 5 years or later if at least 5 years have passed since the previous dose.  Doses of PPSV23 are not needed for people immunized with PPSV23 at or after age 16 years.  Meningococcal vaccine.  Adults with asplenia or persistent complement component deficiencies should receive 2 doses of quadrivalent meningococcal conjugate (MenACWY-D) vaccine. The doses should be obtained at least 2 months apart.  Microbiologists working with certain meningococcal bacteria, military recruits, people at risk during an outbreak, and people who travel to or live in countries with a high rate of meningitis should be immunized.  A first-year college student up through age 27 years who is living in a residence hall should receive a dose if he or she did not receive a dose on or after his or her 16th birthday.  Adults who have  certain high-risk conditions should receive one or more doses of vaccine.  Hepatitis A vaccine.  Adults who wish to be protected from this disease, have certain high-risk conditions, work with hepatitis A-infected animals, work in hepatitis A research labs, or travel to or work in countries with a high rate of hepatitis A should be immunized.  Adults who were previously unvaccinated and who anticipate close contact with an international adoptee during the first 60 days after arrival in the [place] from a country  with a high rate of hepatitis A should be immunized.  Hepatitis B vaccine.  Adults who wish to be protected from this disease, have certain high-risk conditions, may be exposed to blood or other infectious body fluids, are household contacts or sex partners of hepatitis B positive people, are clients or workers in certain care facilities, or travel to or work in countries with a high rate of hepatitis B should be immunized.  Haemophilus influenzae type b (Hib) vaccine.  A previously unvaccinated person with asplenia or sickle cell disease or having a scheduled splenectomy should receive 1 dose of Hib vaccine.  Regardless of previous immunization, a recipient of a hematopoietic stem cell transplant should receive a 3-dose series 6-12 months after his or her successful transplant.  Hib vaccine is not recommended for adults with HIV infection. Document Released: 08/24/2003 Document Revised: 09/28/2012 Document Reviewed: 07/21/2012 [facility] Patient Information 2015 [place], [place]. This information is not intended to replace advice given to you by your health care provider. Make sure you discuss any questions you have with your health care provider.

## 2014-02-14 NOTE — Progress Notes (Signed)
Subjective:    Patient ID: Christina [name], female    DOB: 1945-12-21, 68 y.o.   MRN: [id number]  HPI 68 year old female here for annual exam. No problems or concerns today. Says BP has been doing well. Systolic was elevated at triage, but says it is not elevated when she checks it at home. Every once in a while she will have a flare with her GERD; does not wake her up at night. She takes OTC Prilosec for control of acid reflux. No chest pain or SOB. Does not have a PCP. Says Dr. Elder Cyphers has done most of her physicals. Last colonoscopy was in 2003. Unsure of last mammogram.    Pt sees Elon Alas at [facility] regularly.   Has not had pneumonia vaccine. Has had shingles vaccine.   Pt is requesting flu shot.   Will schedule colonoscopy and mammogram  Review of Systems  Constitutional: Negative.   Eyes: Negative.   Respiratory: Negative.   Cardiovascular: Negative.   Gastrointestinal: Negative.   Endocrine: Negative.   Genitourinary: Negative.   Musculoskeletal: Negative.   Skin: Negative.   Allergic/Immunologic: Negative.   Neurological: Negative.   Hematological: Negative.   Psychiatric/Behavioral: Negative.        Objective:   Physical Exam  Vitals reviewed. Constitutional: She is oriented to person, place, and time. She appears well-developed and well-nourished. No distress.  HENT:  Head: Normocephalic.  Right Ear: External ear normal.  Left Ear: External ear normal.  Nose: Nose normal.  Mouth/Throat: Oropharynx is clear and moist.  Eyes: Conjunctivae and EOM are normal. Pupils are equal, round, and reactive to light. No scleral icterus.  Neck: Normal range of motion. Neck supple. No tracheal deviation present. No thyromegaly present.  Cardiovascular: Normal rate, regular rhythm, normal heart sounds and intact distal pulses.   Pulmonary/Chest: Effort normal and breath sounds normal.  Abdominal: Bowel sounds are normal. She exhibits no mass. There  is no tenderness.  Musculoskeletal: Normal range of motion.  Lymphadenopathy:    She has no cervical adenopathy.  Neurological: She is alert and oriented to person, place, and time. She has normal reflexes. No cranial nerve deficit. She exhibits normal muscle tone. Coordination normal.  Skin: No rash noted.  Psychiatric: She has a normal mood and affect. Her behavior is normal. Judgment and thought content normal.   EKG normal Results for orders placed in visit on 02/14/14  POCT CBC      Result Value Ref Range   WBC 6.6  4.6 - 10.2 K/uL   Lymph, poc 3.2  0.6 - 3.4   POC LYMPH PERCENT 48.2  10 - 50 %L   MID (cbc) 0.3  0 - 0.9   POC MID % 4.2  0 - 12 %M   POC Granulocyte 3.1  2 - 6.9   Granulocyte percent 47.6  37 - 80 %G   RBC 4.66  4.04 - 5.48 M/uL   Hemoglobin 13.4  12.2 - 16.2 g/dL   HCT, POC 42.9  37.7 - 47.9 %   MCV 92.0  80 - 97 fL   MCH, POC 28.8  27 - 31.2 pg   MCHC 31.3 (*) 31.8 - 35.4 g/dL   RDW, POC 16.8     Platelet Count, POC 300  142 - 424 K/uL   MPV 8.6  0 - 99.8 fL  POCT UA - MICROSCOPIC ONLY      Result Value Ref Range   WBC, Ur, HPF, POC  neg     RBC, urine, microscopic neg     Bacteria, U Microscopic neg     Mucus, UA neg     Epithelial cells, urine per micros 0-2     Crystals, Ur, HPF, POC neg     Casts, Ur, LPF, POC neg     Yeast, UA neg    POCT URINALYSIS DIPSTICK      Result Value Ref Range   Color, UA yellow     Clarity, UA clear     Glucose, UA neg     Bilirubin, UA neg     Ketones, UA neg     Spec Grav, UA <=1.005     Blood, UA neg     pH, UA 5.5     Protein, UA neg     Urobilinogen, UA 0.2     Nitrite, UA neg     Leukocytes, UA Negative            Assessment & Plan:  Healthy Exam RF meds

## 2014-02-15 ENCOUNTER — Encounter: Payer: Self-pay | Admitting: Radiology

## 2014-02-18 ENCOUNTER — Encounter: Payer: Self-pay | Admitting: Gastroenterology

## 2014-03-03 ENCOUNTER — Ambulatory Visit
Admission: RE | Admit: 2014-03-03 | Discharge: 2014-03-03 | Disposition: A | Discharge status: Home / Self Care | Payer: BC Managed Care – PPO | Source: Ambulatory Visit | Attending: Internal Medicine | Admitting: Internal Medicine

## 2014-03-03 DIAGNOSIS — Z1239 Encounter for other screening for malignant neoplasm of breast: Secondary | ICD-10-CM

## 2014-03-31 ENCOUNTER — Ambulatory Visit (AMBULATORY_SURGERY_CENTER): Payer: Self-pay

## 2014-03-31 VITALS — Ht 68.0 in | Wt 159.4 lb

## 2014-03-31 DIAGNOSIS — Z86010 Personal history of colonic polyps: Secondary | ICD-10-CM

## 2014-03-31 MED ORDER — MOVIPREP 100 G PO SOLR: ORAL | Status: DC

## 2014-03-31 NOTE — Progress Notes (Signed)
Per pt, no allergies to soy or egg products.Pt not taking any weight loss meds or using  O2 at home. 

## 2014-04-14 ENCOUNTER — Encounter: Payer: Self-pay | Admitting: Gastroenterology

## 2014-04-14 ENCOUNTER — Ambulatory Visit (AMBULATORY_SURGERY_CENTER): Payer: BC Managed Care – PPO | Admitting: Gastroenterology

## 2014-04-14 ENCOUNTER — Telehealth: Payer: Self-pay | Admitting: Gastroenterology

## 2014-04-14 VITALS — BP 152/92 | HR 58 | Temp 96.7°F | Resp 11 | Ht 68.0 in | Wt 159.0 lb

## 2014-04-14 DIAGNOSIS — D122 Benign neoplasm of ascending colon: Secondary | ICD-10-CM

## 2014-04-14 DIAGNOSIS — D123 Benign neoplasm of transverse colon: Secondary | ICD-10-CM

## 2014-04-14 DIAGNOSIS — D125 Benign neoplasm of sigmoid colon: Secondary | ICD-10-CM

## 2014-04-14 DIAGNOSIS — Z86010 Personal history of colonic polyps: Secondary | ICD-10-CM

## 2014-04-14 DIAGNOSIS — D124 Benign neoplasm of descending colon: Secondary | ICD-10-CM

## 2014-04-14 MED ORDER — SODIUM CHLORIDE 0.9 % IV SOLN: 500.0000 mL | INTRAVENOUS | Status: DC

## 2014-04-14 NOTE — Progress Notes (Signed)
Called to room to assist during endoscopic procedure.  Patient ID and intended procedure confirmed with present staff. Received instructions for my participation in the procedure from the performing physician.  

## 2014-04-14 NOTE — Op Note (Signed)
[place]  [address]. [place], [address]   COLONOSCOPY PROCEDURE REPORT PATIENT: Christina [name], Christina [name]  MR#: [id number] BIRTHDATE: 1946/06/04 , 68  yrs. old GENDER: female ENDOSCOPIST: Ladene Artist, MD, [facility] REFERRED FX:TKWIO, Chris PROCEDURE DATE:  04/14/2014 PROCEDURE:   Colonoscopy with biopsy and Colonoscopy with snare polypectomy First Screening Colonoscopy - Avg.  risk and is 50 yrs.  old or older - No.  Prior Negative Screening - Now for repeat screening. N/A  History of Adenoma - Now for follow-up colonoscopy & has been > or = to 3 yrs.  Yes hx of adenoma.  Has been 3 or more years since last colonoscopy.  Polyps Removed Today? Yes. ASA CLASS:   Class II INDICATIONS:surveillance colonoscopy based on a history of adenomatous colonic polyp(s). MEDICATIONS: Monitored anesthesia care and Propofol 160 mg IV DESCRIPTION OF PROCEDURE:   After the risks benefits and alternatives of the procedure were thoroughly explained, informed consent was obtained.  The digital rectal exam revealed no abnormalities of the rectum.   The LB [id number]  endoscope was introduced through the anus and advanced to the cecum, which was identified by both the appendix and ileocecal valve. No adverse events experienced.   The quality of the prep was excellent, using MoviPrep  The instrument was then slowly withdrawn as the colon was fully examined.  COLON FINDINGS: Two sessile polyps measuring 5 mm in size were found in the descending colon and ascending colon.  A polypectomy was performed with a cold snare.  The resection was complete, the polyp tissue was completely retrieved and sent to histology.   Two sessile polyps measuring 4 mm in size were found in the transverse colon.  A polypectomy was performed with cold forceps.  The resection was complete, the polyp tissue was completely retrieved and sent to histology.   Two sessile polyps measuring 6 mm in size were found in  the sigmoid colon.  A polypectomy was performed with a cold snare.  The resection was complete, the polyp tissue was completely retrieved and sent to histology.   The examination was otherwise normal.  Retroflexed views revealed internal Grade I hemorrhoids. The time to cecum=4 minutes 40 seconds.  Withdrawal time=11 minutes 27 seconds.  The scope was withdrawn and the procedure completed. COMPLICATIONS: There were no immediate complications.  ENDOSCOPIC IMPRESSION: 1.   Two sessile polyps in the descending and ascending colon; polypectomy performed with a cold snare 2.   Two sessile polyps in the transverse colon; polypectomy performed with cold forceps 3.   Two sessile polyps in the sigmoid colon; polypectomy performed with a cold snare 4.   Grade I internal hemorrhoids  RECOMMENDATIONS: 1.  Await pathology results 2.  Repeat Colonoscopy in 5 years.  eSigned:  Ladene Artist, MD, [facility] 04/14/2014 12:06 PM

## 2014-04-14 NOTE — Patient Instructions (Signed)

## 2014-04-14 NOTE — Progress Notes (Signed)
No problems noted in the recovery room. maw 

## 2014-04-14 NOTE — Progress Notes (Signed)
A/ox3 pleased with MAC, report to Annette RN 

## 2014-04-15 ENCOUNTER — Telehealth: Payer: Self-pay | Admitting: *Deleted

## 2014-04-15 NOTE — Telephone Encounter (Signed)
  Follow up Call-  Call back number 04/14/2014  Post procedure Call Back phone  # [phone]  Permission to leave phone message Yes     Patient questions:  Do you have a fever, pain , or abdominal swelling? No. Pain Score  0 *  Have you tolerated food without any problems? Yes.    Have you been able to return to your normal activities? Yes.    Do you have any questions about your discharge instructions: Diet   No. Medications  No. Follow up visit  No.  Do you have questions or concerns about your Care? No.  Actions: * If pain score is 4 or above: No action needed, pain <4.

## 2014-04-17 NOTE — Telephone Encounter (Signed)
error 

## 2014-04-20 ENCOUNTER — Encounter: Payer: Self-pay | Admitting: Gastroenterology

## 2014-08-03 ENCOUNTER — Ambulatory Visit (INDEPENDENT_AMBULATORY_CARE_PROVIDER_SITE_OTHER): Payer: BLUE CROSS/BLUE SHIELD | Admitting: Physician Assistant

## 2014-08-03 VITALS — BP 124/72 | HR 82 | Temp 97.6°F | Resp 18 | Ht 66.75 in | Wt 159.4 lb

## 2014-08-03 DIAGNOSIS — T783XXA Angioneurotic edema, initial encounter: Secondary | ICD-10-CM

## 2014-08-03 MED ORDER — METHYLPREDNISOLONE SODIUM SUCC 125 MG IJ SOLR
125.0000 mg | Freq: Once | INTRAMUSCULAR | Status: AC
  Administered 2014-08-03: 125 mg via INTRAMUSCULAR

## 2014-08-03 MED ORDER — RANITIDINE HCL 150 MG PO TABS
150.0000 mg | ORAL_TABLET | Freq: Once | ORAL | Status: AC
  Administered 2014-08-03: 150 mg via ORAL

## 2014-08-03 MED ORDER — CETIRIZINE HCL 10 MG PO TABS
10.0000 mg | ORAL_TABLET | Freq: Once | ORAL | Status: AC
  Administered 2014-08-03: 10 mg via ORAL

## 2014-08-03 NOTE — Progress Notes (Signed)
Subjective:    Patient ID: Christina [name], female    DOB: Nov 04, 1945, 68 y.o.   MRN: [id number]   PCP: Kennon Portela, MD  Chief Complaint  Patient presents with  . Facial Swelling    right side of face--jaw and right side of lips.  denies any difficulty breathing or swallowing.  swelling started this morning.     Allergies  Allergen Reactions  . Coconut Oil     HIVES  . Codeine Other (See Comments)    Upsets stomach really bad    Patient Active Problem List   Diagnosis Date Noted  . GERD (gastroesophageal reflux disease) 08/10/2013  . Other and unspecified hyperlipidemia 06/23/2013  . HTN (hypertension) 07/23/2012  . Lipid disorder 07/23/2012    Prior to Admission medications   Medication Sig Start Date End Date Taking? Authorizing Provider  albuterol (PROVENTIL HFA;VENTOLIN HFA) 108 (90 BASE) MCG/ACT inhaler Use 2 inhalations every 4-6 hours as needed for cough wheezing 08/03/13  Yes Posey Boyer, MD  amLODipine (NORVASC) 5 MG tablet Take 1 tablet (5 mg total) by mouth daily. 02/14/14  Yes Orma Flaming, MD  EPINEPHrine (EPIPEN) 0.3 mg/0.3 mL DEVI Inject 0.3 mLs (0.3 mg total) into the muscle once. 11/16/11  Yes Darlyne Russian, MD  esomeprazole (NEXIUM) 20 MG capsule Take 20 mg by mouth daily at 12 noon.   Yes Historical Provider, MD  simvastatin (ZOCOR) 20 MG tablet Take 1 tablet (20 mg total) by mouth daily at 6 PM. 02/14/14  Yes Orma Flaming, MD  trandolapril (MAVIK) 4 MG tablet Take 1 tablet (4 mg total) by mouth daily. PATIENT NEEDS OFFICE VISIT FOR ADDITIONAL REFILLS 02/14/14  Yes Orma Flaming, MD    Medical, Surgical, Family and Social History reviewed and updated.  HPI  Presents with swelling of the lips and jaw that began this morning. Little bumps on the inside of the lips. Then all of a sudden "My whole jaw puffed up." Happened on the LEFT side of the mouth in 02/2014, but resolved, so she did not seek treatment.  No SOB, CP, itching, throat fullness,  difficulty swallowing, itching. No dental pain.  No new foods, medications, recent travel. No new cleaning products, soaps, detergents, etc. Took a dose of Coricidin last week.  Review of Systems As above.    Objective:   Physical Exam  Constitutional: She is oriented to person, place, and time. She appears well-developed and well-nourished. No distress.  BP 124/72 mmHg  Pulse 82  Temp(Src) 97.6 F (36.4 C) (Oral)  Resp 18  Ht 5' 6.75" (1.695 m)  Wt 159 lb 6 oz (72.292 kg)  BMI 25.16 kg/m2  SpO2 98%   HENT:  Head: Normocephalic and atraumatic.    Right Ear: Hearing, tympanic membrane, external ear and ear canal normal.  Left Ear: Hearing, tympanic membrane, external ear and ear canal normal.  Nose: Nose normal.  Mouth/Throat: Uvula is midline, oropharynx is clear and moist and mucous membranes are normal. Abnormal dentition: multiple missing teeth.  Eyes: Conjunctivae are normal. No scleral icterus.  Neck: Full passive range of motion without pain and phonation normal. Neck supple. No thyromegaly present.  Cardiovascular: Normal rate, regular rhythm, normal heart sounds and intact distal pulses.   Pulmonary/Chest: Effort normal and breath sounds normal.  Lymphadenopathy:       Head (right side): No submental, no submandibular, no tonsillar, no preauricular, no posterior auricular and no occipital adenopathy present.  Head (left side): No submental, no submandibular, no tonsillar, no preauricular, no posterior auricular and no occipital adenopathy present.    She has no cervical adenopathy.       Right: No supraclavicular adenopathy present.       Left: No supraclavicular adenopathy present.  Neurological: She is alert and oriented to person, place, and time. No cranial nerve deficit or sensory deficit.  Skin: Skin is warm and dry. No rash noted.  Psychiatric: She has a normal mood and affect. Her speech is normal and behavior is normal.          Assessment & Plan:   1. Angioedema of lips, initial encounter Stop trandolapril.  Zantac and zyrtec given here orally. IM dose of Solu-medrol.  Beginning tomorrow, OTC loratadine QD and ranitidine BID. Increase amlodipine to 10 mg QD by taking 2 of the 5 mg tablets. - ranitidine (ZANTAC) tablet 150 mg; Take 1 tablet (150 mg total) by mouth once. - cetirizine (ZYRTEC) tablet 10 mg; Take 1 tablet (10 mg total) by mouth once. - methylPREDNISolone sodium succinate (SOLU-MEDROL) 125 mg/2 mL injection 125 mg; Inject 2 mLs (125 mg total) into the muscle once.  RTC in 2 days for re-evaluation. If symptoms worsen, or if she develops fullness of the tongue or throat, any itching or tingling in the mouth or throat, or SOB go to the ED.  Fara Chute, PA-C Physician Assistant-Certified [facility]

## 2014-08-03 NOTE — Patient Instructions (Addendum)
Stop the trandolapril.  Increase the amlodipine (Norvasc to 10 mg each day by taking 2 of them together one time each day). Take OTC Claritin (loratadine) 10 mg each day AND Zantac (ranitidine 150 mg) twice each day.  If your symptoms worsen, or if you become short of breath, have difficulty swallowing or feeling itching or fullness insdie your mouth or throat, return here immediately or go directly to the emergency department.

## 2014-08-04 ENCOUNTER — Inpatient Hospital Stay (HOSPITAL_COMMUNITY)
Admission: EM | Admit: 2014-08-04 | Discharge: 2014-08-05 | DRG: 916 | Disposition: A | Discharge status: Home / Self Care | Payer: BLUE CROSS/BLUE SHIELD | Attending: Family Medicine | Admitting: Family Medicine

## 2014-08-04 ENCOUNTER — Encounter (HOSPITAL_COMMUNITY): Payer: Self-pay

## 2014-08-04 DIAGNOSIS — T380X5A Adverse effect of glucocorticoids and synthetic analogues, initial encounter: Secondary | ICD-10-CM | POA: Diagnosis present

## 2014-08-04 DIAGNOSIS — K219 Gastro-esophageal reflux disease without esophagitis: Secondary | ICD-10-CM | POA: Diagnosis present

## 2014-08-04 DIAGNOSIS — T783XXD Angioneurotic edema, subsequent encounter: Secondary | ICD-10-CM | POA: Diagnosis not present

## 2014-08-04 DIAGNOSIS — Z9109 Other allergy status, other than to drugs and biological substances: Secondary | ICD-10-CM | POA: Diagnosis not present

## 2014-08-04 DIAGNOSIS — T464X5D Adverse effect of angiotensin-converting-enzyme inhibitors, subsequent encounter: Secondary | ICD-10-CM | POA: Diagnosis not present

## 2014-08-04 DIAGNOSIS — E785 Hyperlipidemia, unspecified: Secondary | ICD-10-CM | POA: Diagnosis present

## 2014-08-04 DIAGNOSIS — Z888 Allergy status to other drugs, medicaments and biological substances status: Secondary | ICD-10-CM

## 2014-08-04 DIAGNOSIS — E789 Disorder of lipoprotein metabolism, unspecified: Secondary | ICD-10-CM | POA: Diagnosis not present

## 2014-08-04 DIAGNOSIS — I10 Essential (primary) hypertension: Secondary | ICD-10-CM | POA: Diagnosis present

## 2014-08-04 DIAGNOSIS — Z885 Allergy status to narcotic agent status: Secondary | ICD-10-CM

## 2014-08-04 DIAGNOSIS — Z806 Family history of leukemia: Secondary | ICD-10-CM

## 2014-08-04 DIAGNOSIS — T464X5A Adverse effect of angiotensin-converting-enzyme inhibitors, initial encounter: Secondary | ICD-10-CM | POA: Diagnosis present

## 2014-08-04 DIAGNOSIS — Z87891 Personal history of nicotine dependence: Secondary | ICD-10-CM

## 2014-08-04 DIAGNOSIS — Z0000 Encounter for general adult medical examination without abnormal findings: Secondary | ICD-10-CM

## 2014-08-04 DIAGNOSIS — Z1239 Encounter for other screening for malignant neoplasm of breast: Secondary | ICD-10-CM

## 2014-08-04 DIAGNOSIS — R609 Edema, unspecified: Secondary | ICD-10-CM | POA: Diagnosis present

## 2014-08-04 DIAGNOSIS — Z79899 Other long term (current) drug therapy: Secondary | ICD-10-CM

## 2014-08-04 DIAGNOSIS — T783XXA Angioneurotic edema, initial encounter: Secondary | ICD-10-CM | POA: Insufficient documentation

## 2014-08-04 DIAGNOSIS — Z8249 Family history of ischemic heart disease and other diseases of the circulatory system: Secondary | ICD-10-CM | POA: Diagnosis not present

## 2014-08-04 DIAGNOSIS — D72829 Elevated white blood cell count, unspecified: Secondary | ICD-10-CM | POA: Diagnosis present

## 2014-08-04 DIAGNOSIS — Z1211 Encounter for screening for malignant neoplasm of colon: Secondary | ICD-10-CM

## 2014-08-04 LAB — CBC
HCT: 40.9 % (ref 36.0–46.0)
HEMATOCRIT: 40.8 % (ref 36.0–46.0)
HEMOGLOBIN: 13.6 g/dL (ref 12.0–15.0)
Hemoglobin: 13.7 g/dL (ref 12.0–15.0)
MCH: 29.8 pg (ref 26.0–34.0)
MCH: 30.1 pg (ref 26.0–34.0)
MCHC: 33.3 g/dL (ref 30.0–36.0)
MCHC: 33.5 g/dL (ref 30.0–36.0)
MCV: 89.3 fL (ref 78.0–100.0)
MCV: 89.9 fL (ref 78.0–100.0)
PLATELETS: 339 10*3/uL (ref 150–400)
Platelets: 436 10*3/uL — ABNORMAL HIGH (ref 150–400)
RBC: 4.57 MIL/uL (ref 3.87–5.11)
RBC: 4.55 MIL/uL (ref 3.87–5.11)
RDW: 14.6 % (ref 11.5–15.5)
RDW: 14.7 % (ref 11.5–15.5)
WBC: 9 10*3/uL (ref 4.0–10.5)
WBC: 10.7 10*3/uL — ABNORMAL HIGH (ref 4.0–10.5)

## 2014-08-04 LAB — BASIC METABOLIC PANEL
ANION GAP: 8 (ref 5–15)
BUN: 18 mg/dL (ref 6–23)
CHLORIDE: 105 mmol/L (ref 96–112)
CO2: 26 mmol/L (ref 19–32)
Calcium: 9.4 mg/dL (ref 8.4–10.5)
Creatinine, Ser: 0.86 mg/dL (ref 0.50–1.10)
GFR calc Af Amer: 79 mL/min — ABNORMAL LOW (ref >90)
GFR calc non Af Amer: 68 mL/min — ABNORMAL LOW (ref >90)
GLUCOSE: 149 mg/dL — AB (ref 70–99)
POTASSIUM: 4.1 mmol/L (ref 3.5–5.1)
Sodium: 139 mmol/L (ref 135–145)

## 2014-08-04 LAB — TYPE AND SCREEN
ABO/RH(D): O POS
ANTIBODY SCREEN: NEGATIVE

## 2014-08-04 LAB — CREATININE, SERUM
CREATININE: 0.78 mg/dL (ref 0.50–1.10)
GFR calc Af Amer: >90 mL/min (ref >90)
GFR calc non Af Amer: 85 mL/min — ABNORMAL LOW (ref >90)

## 2014-08-04 LAB — GLUCOSE, CAPILLARY
Glucose-Capillary: 108 mg/dL — ABNORMAL HIGH (ref 70–99)
Glucose-Capillary: 123 mg/dL — ABNORMAL HIGH (ref 70–99)

## 2014-08-04 LAB — ABO/RH: ABO/RH(D): O POS

## 2014-08-04 LAB — MRSA PCR SCREENING: MRSA BY PCR: NEGATIVE

## 2014-08-04 MED ORDER — ACETAMINOPHEN 325 MG PO TABS: 650.0000 mg | ORAL_TABLET | Freq: Four times a day (QID) | ORAL | Status: DC | PRN

## 2014-08-04 MED ORDER — KCL IN DEXTROSE-NACL 20-5-0.45 MEQ/L-%-% IV SOLN
INTRAVENOUS | Status: DC
  Administered 2014-08-04: 13:00:00 via INTRAVENOUS
  Filled 2014-08-04 (×3): qty 1000

## 2014-08-04 MED ORDER — DIPHENHYDRAMINE HCL 50 MG/ML IJ SOLN
25.0000 mg | Freq: Once | INTRAMUSCULAR | Status: AC
  Administered 2014-08-04: 25 mg via INTRAVENOUS
  Filled 2014-08-04: qty 1

## 2014-08-04 MED ORDER — ALBUTEROL SULFATE (2.5 MG/3ML) 0.083% IN NEBU
2.5000 mg | INHALATION_SOLUTION | Freq: Four times a day (QID) | RESPIRATORY_TRACT | Status: DC | PRN
Start: 2014-08-04 — End: 2014-08-05

## 2014-08-04 MED ORDER — C1 ESTERASE INHIBITOR (HUMAN) 500 UNITS IV KIT
1500.0000 [IU] | PACK | Freq: Once | INTRAVENOUS | Status: AC
  Administered 2014-08-04: 1500 [IU] via INTRAVENOUS
  Filled 2014-08-04: qty 3

## 2014-08-04 MED ORDER — FAMOTIDINE IN NACL 20-0.9 MG/50ML-% IV SOLN
20.0000 mg | INTRAVENOUS | Status: DC
  Administered 2014-08-05: 20 mg via INTRAVENOUS
  Filled 2014-08-04: qty 50

## 2014-08-04 MED ORDER — DIPHENHYDRAMINE HCL 50 MG/ML IJ SOLN
25.0000 mg | Freq: Every day | INTRAMUSCULAR | Status: DC
  Administered 2014-08-04 – 2014-08-05 (×2): 25 mg via INTRAVENOUS
  Filled 2014-08-04 (×2): qty 1

## 2014-08-04 MED ORDER — EPINEPHRINE 0.3 MG/0.3ML IJ SOAJ
0.3000 mg | Freq: Once | INTRAMUSCULAR | Status: AC
  Administered 2014-08-04: 0.3 mg via INTRAMUSCULAR
  Filled 2014-08-04: qty 0.3

## 2014-08-04 MED ORDER — ONDANSETRON HCL 4 MG PO TABS: 4.0000 mg | ORAL_TABLET | Freq: Four times a day (QID) | ORAL | Status: DC | PRN

## 2014-08-04 MED ORDER — ACETAMINOPHEN 650 MG RE SUPP: 650.0000 mg | Freq: Four times a day (QID) | RECTAL | Status: DC | PRN

## 2014-08-04 MED ORDER — ONDANSETRON HCL 4 MG/2ML IJ SOLN: 4.0000 mg | Freq: Four times a day (QID) | INTRAMUSCULAR | Status: DC | PRN

## 2014-08-04 MED ORDER — SIMVASTATIN 10 MG PO TABS
20.0000 mg | ORAL_TABLET | Freq: Every day | ORAL | Status: DC
  Administered 2014-08-04: 20 mg via ORAL
  Filled 2014-08-04: qty 2

## 2014-08-04 MED ORDER — HEPARIN SODIUM (PORCINE) 5000 UNIT/ML IJ SOLN
5000.0000 [IU] | Freq: Three times a day (TID) | INTRAMUSCULAR | Status: DC
  Administered 2014-08-04 – 2014-08-05 (×4): 5000 [IU] via SUBCUTANEOUS
  Filled 2014-08-04 (×4): qty 1

## 2014-08-04 MED ORDER — C1 ESTERASE INHIBITOR (HUMAN) 500 UNITS IV KIT
20.0000 [IU]/kg | PACK | Freq: Once | INTRAVENOUS | Status: DC
  Filled 2014-08-04: qty 4

## 2014-08-04 MED ORDER — FAMOTIDINE IN NACL 20-0.9 MG/50ML-% IV SOLN
20.0000 mg | Freq: Once | INTRAVENOUS | Status: AC
  Administered 2014-08-04: 20 mg via INTRAVENOUS
  Filled 2014-08-04: qty 50

## 2014-08-04 MED ORDER — METHYLPREDNISOLONE SODIUM SUCC 125 MG IJ SOLR
60.0000 mg | Freq: Every day | INTRAMUSCULAR | Status: DC
  Administered 2014-08-04 – 2014-08-05 (×2): 60 mg via INTRAVENOUS
  Filled 2014-08-04 (×2): qty 2

## 2014-08-04 MED ORDER — SODIUM CHLORIDE 0.9 % IV SOLN
Freq: Once | INTRAVENOUS | Status: AC
  Administered 2014-08-04: 05:00:00 via INTRAVENOUS

## 2014-08-04 MED ORDER — HYDRALAZINE HCL 20 MG/ML IJ SOLN
10.0000 mg | Freq: Four times a day (QID) | INTRAMUSCULAR | Status: DC | PRN
  Administered 2014-08-04 – 2014-08-05 (×2): 10 mg via INTRAVENOUS
  Filled 2014-08-04 (×2): qty 1

## 2014-08-04 NOTE — Progress Notes (Signed)
CARE MANAGEMENT NOTE 08/04/2014  Patient:  KENZIE, THORESON   Account Number:  [id number]  Date Initiated:  08/04/2014  Documentation initiated by:  DAVIS,RHONDA  Subjective/Objective Assessment:   resp distress with pharyngeal swelling and hx of reaction to medication-ace     Action/Plan:   home when stable   Anticipated DC Date:  08/06/2014   Anticipated DC Plan:  HOME/SELF CARE  In-house referral  NA      DC Planning Services  NA      [facility] Choice  NA   Choice offered to / List presented to:  NA   DME arranged  NA      DME agency  NA     [place] arranged  NA      [place] agency  NA   Status of service:  In process, will continue to follow Medicare Important Message given?   (If response is "NO", the following Medicare IM given date fields will be blank) Date Medicare IM given:   Medicare IM given by:   Date Additional Medicare IM given:   Additional Medicare IM given by:    Discharge Disposition:    Per UR Regulation:  Reviewed for med. necessity/level of care/duration of stay  If discussed at [place] of Stay Meetings, dates discussed:    Comments:  Feb. 18 2016/Rhonda L. Rosana Hoes, RN, BSN, CCM/ Case Management [place] [phone] No discharge needs present of time of review.

## 2014-08-04 NOTE — ED Notes (Signed)
Pt states that when she went to sleep she noticed some swelling to her upper lip, then woke up later to her entire bottom face swollen. Pt denies any SOB. Sats 96% RA.

## 2014-08-04 NOTE — ED Provider Notes (Signed)
CSN: [id number]     Arrival date & time 08/04/14  0409 History   First MD Initiated Contact with Patient 08/04/14 0415     Chief Complaint  Patient presents with  . Allergic Reaction    (Consider location/radiation/quality/duration/timing/severity/associated sxs/prior Treatment) HPI Comments: 69 year old female with a history of hypertension, hyperlipidemia, and esophageal reflux presents to the emergency department for further evaluation of lip and facial swelling. Patient states that her symptoms began yesterday morning with swelling to the right side of her face. Patient states that she took Benadryl for symptoms without relief. She presented to urgent care at approximately 1900, at which time she was treated with Solu-Medrol and oral antihistamines. Patient reports no improvement in her symptoms since being treated at urgent care. She states that she awoke this evening and noticed that her symptoms had worsened, with swelling spreading to her upper lip and left side of her face. Patient denies any modifying factors of her symptoms as well as new topical contacts, food ingestions, or medication changes. No recent travel. Patient reports no associated fever, drooling, inability to swallow, shortness of breath, wheezing, or voice changes. No trauma or injury to the area. She d/c'd taking trandolapril yesterday after UC evaluation.  Patient is a 69 y.o. female presenting with allergic reaction. The history is provided by the patient. No language interpreter was used.  Allergic Reaction Presenting symptoms: no difficulty swallowing     Past Medical History  Diagnosis Date  . Reflux   . Hypertension   . Hyperlipidemia   . GERD (gastroesophageal reflux disease)   . Allergy    Past Surgical History  Procedure Laterality Date  . Cesarean section      1 time   Family History  Problem Relation Age of Onset  . Hypertension Mother   . Leukemia Father    History  Substance Use Topics  .  Smoking status: Former Smoker -- 0.50 packs/day    Types: Cigarettes    Quit date: 08/03/2013  . Smokeless tobacco: Never Used  . Alcohol Use: No   OB History    No data available      Review of Systems  HENT: Positive for facial swelling. Negative for dental problem, drooling and trouble swallowing.   Respiratory: Negative for shortness of breath.   All other systems reviewed and are negative.   Allergies  Coconut oil; Codeine; and Trandolapril  Home Medications   Prior to Admission medications   Medication Sig Start Date End Date Taking? Authorizing Provider  amLODipine (NORVASC) 5 MG tablet Take 1 tablet (5 mg total) by mouth daily. 02/14/14  Yes Orma Flaming, MD  Chlorpheniramine-APAP 2-325 MG TABS Take 1 tablet by mouth every 4 (four) hours as needed (cold symptoms).   Yes Historical Provider, MD  omeprazole (PRILOSEC OTC) 20 MG tablet Take 20 mg by mouth daily.   Yes Historical Provider, MD  simvastatin (ZOCOR) 20 MG tablet Take 1 tablet (20 mg total) by mouth daily at 6 PM. 02/14/14  Yes Orma Flaming, MD  trandolapril (MAVIK) 4 MG tablet Take 1 tablet by mouth daily. 05/11/14  Yes Historical Provider, MD  albuterol (PROVENTIL HFA;VENTOLIN HFA) 108 (90 BASE) MCG/ACT inhaler Use 2 inhalations every 4-6 hours as needed for cough wheezing 08/03/13   Posey Boyer, MD  EPINEPHrine (EPIPEN) 0.3 mg/0.3 mL DEVI Inject 0.3 mLs (0.3 mg total) into the muscle once. 11/16/11   Darlyne Russian, MD   BP 135/70 mmHg  Pulse 92  Temp(Src) 98.5 F (36.9 C) (Oral)  Resp 16  Ht 5\' 8"  (1.727 m)  Wt 170 lb (77.111 kg)  BMI 25.85 kg/m2  SpO2 97%   Physical Exam  Constitutional: She is oriented to person, place, and time. She appears well-developed and well-nourished. No distress.  Nontoxic/nonseptic appearing  HENT:  Head: Normocephalic and atraumatic.  Mouth/Throat: No oropharyngeal exudate.  Oropharynx clear. Patient tolerating secretions without difficulty. No oral lesions. Patient  does have angioedema to her upper lip as well as mildly to her bilateral buccal mucosa. No drooling or voice muffling.  Eyes: Conjunctivae and EOM are normal. Pupils are equal, round, and reactive to light. No scleral icterus.  Neck: Normal range of motion. Neck supple.  No stridor  Cardiovascular: Normal rate, regular rhythm, normal heart sounds and intact distal pulses.   Pulmonary/Chest: Effort normal and breath sounds normal. No respiratory distress. She has no wheezes. She has no rales.  Respirations even and unlabored. Lungs clear.  Musculoskeletal: Normal range of motion.  Neurological: She is alert and oriented to person, place, and time. She exhibits normal muscle tone. Coordination normal.  Skin: Skin is warm and dry. No rash noted. She is not diaphoretic. No erythema. No pallor.  Psychiatric: She has a normal mood and affect. Her behavior is normal.  Nursing note and vitals reviewed.   ED Course  Procedures (including critical care time) Labs Review Labs Reviewed  CBC - Abnormal; Notable for the following:    Platelets 436 (*)    All other components within normal limits  BASIC METABOLIC PANEL - Abnormal; Notable for the following:    Glucose, Bld 149 (*)    GFR calc non Af Amer 68 (*)    GFR calc Af Amer 79 (*)    All other components within normal limits  TYPE AND SCREEN  PREPARE FRESH FROZEN PLASMA    Imaging Review No results found.   EKG Interpretation None      MDM   Final diagnoses:  ACE inhibitor-aggravated angioedema, initial encounter    69 year old female present to the emergency department for further evaluation of worsening angioedema. Symptoms began yesterday morning and have been persisting despite supportive treatment as an outpatient and at urgent care evaluation yesterday. Angioedema likely secondary to use of Ace inhibitors which patient has been on for "years". Her last dose was just prior to onset of symptoms yesterday. Patient with intact  airway. She is tolerating secretions without difficulty. No hypoxia. Given worsening symptoms, however, will admit patient for further monitoring. Case discussed with Dr. Hal Hope of [facility] who requests addition of FFP transfusion. Type and screen added. VSS at this time.   Filed Vitals:   08/04/14 0417 08/04/14 0532  BP: 158/94 135/70  Pulse: 89 92  Temp: 98.5 F (36.9 C)   TempSrc: Oral   Resp: 14 16  Height: 5\' 8"  (1.727 m)   Weight: 170 lb (77.111 kg)   SpO2: 97% 97%     Antonietta Breach, PA-C 08/04/14 9147  Kalman Drape, MD 08/04/14 [phone]

## 2014-08-04 NOTE — H&P (Signed)
[facility] History and Physical  Christina [name] LEX:[id number] DOB: [birth year] DOA: 08/04/2014  Referring physician: Dr. Sharol Given PCP: Christina Portela, MD   Chief Complaint: Facial swelling worse at lips  HPI: Christina [name] is a 69 y.o. female  African-American female with history of hypertension, hyperlipidemia and GERD. Presenting to the ED after patient developing worsening facial swelling particularly her lips. She denies any throat involvement and denies any difficulty swallowing or breathing. Reportedly patient has taken an ACE inhibitor for years which was discontinued yesterday per EMR notes. The patient saw her PCP and was given steroids and H1 and H2 antihistamines without much improvement as such she presented to the hospital for further evaluation recommendations. Other than the facial edema particularly at the lips patient did not have any other complaints.  While in the ED patient received epinephrine Pepcid and more Benadryl. We were consulted for further evaluation and recommendations.   Review of Systems:  Constitutional:  No weight loss, night sweats, Fevers, chills, fatigue.  HEENT:  No headaches, Difficulty swallowing,Tooth/dental problems,Sore throat,  No sneezing, itching, ear ache, nasal congestion, post nasal drip,  Cardio-vascular:  No chest pain, Orthopnea, PND, swelling in lower extremities, anasarca, dizziness, palpitations  GI:  No heartburn, indigestion, abdominal pain, nausea, vomiting, diarrhea, change in bowel habits, loss of appetite  Resp:  No shortness of breath with exertion or at rest. No excess mucus, no productive cough, No non-productive cough, No coughing up of blood.No change in color of mucus.No wheezing.No chest wall deformity  Skin:  no rash or lesions.  GU:  no dysuria, change in color of urine, no urgency or frequency. No flank pain.  Musculoskeletal:  No joint pain or swelling. No decreased range of motion. No back pain.    Psych:  No change in mood or affect. No depression or anxiety. No memory loss.   Past Medical History  Diagnosis Date  . Reflux   . Hypertension   . Hyperlipidemia   . GERD (gastroesophageal reflux disease)   . Allergy    Past Surgical History  Procedure Laterality Date  . Cesarean section      1 time   Social History:  reports that she quit smoking about a year ago. Her smoking use included Cigarettes. She smoked 0.50 packs per day. She has never used smokeless tobacco. She reports that she does not drink alcohol or use illicit drugs.  Allergies  Allergen Reactions  . Coconut Oil     HIVES  . Codeine Other (See Comments)    Upsets stomach really bad  . Trandolapril Swelling    Swelling of the face/lips 07/2014    Family History  Problem Relation Age of Onset  . Hypertension Mother   . Leukemia Father     Prior to Admission medications   Medication Sig Start Date End Date Taking? Authorizing Provider  amLODipine (NORVASC) 5 MG tablet Take 1 tablet (5 mg total) by mouth daily. 02/14/14  Yes Orma Flaming, MD  Chlorpheniramine-APAP 2-325 MG TABS Take 1 tablet by mouth every 4 (four) hours as needed (cold symptoms).   Yes Historical Provider, MD  omeprazole (PRILOSEC OTC) 20 MG tablet Take 20 mg by mouth daily.   Yes Historical Provider, MD  simvastatin (ZOCOR) 20 MG tablet Take 1 tablet (20 mg total) by mouth daily at 6 PM. 02/14/14  Yes Orma Flaming, MD  albuterol (PROVENTIL HFA;VENTOLIN HFA) 108 (90 BASE) MCG/ACT inhaler Use 2 inhalations every 4-6 hours as needed  for cough wheezing 08/03/13   Posey Boyer, MD  EPINEPHrine (EPIPEN) 0.3 mg/0.3 mL DEVI Inject 0.3 mLs (0.3 mg total) into the muscle once. 11/16/11   Darlyne Russian, MD   Physical Exam: Filed Vitals:   08/04/14 0417 08/04/14 0532  BP: 158/94 135/70  Pulse: 89 92  Temp: 98.5 F (36.9 C)   TempSrc: Oral   Resp: 14 16  Height: 5\' 8"  (1.727 m)   Weight: 77.111 kg (170 lb)   SpO2: 97% 97%    Wt Readings  from Last 3 Encounters:  08/04/14 77.111 kg (170 lb)  08/03/14 72.292 kg (159 lb 6 oz)  04/14/14 72.122 kg (159 lb)    General:  Appears calm and comfortable Eyes: PERRL, normal lids, irises & conjunctiva ENT: grossly normal hearing and tongue, swollen lips, no posterior oropharynx edema Neck: no LAD, masses or thyromegaly Cardiovascular: RRR, no m/r/g. No LE edema. Respiratory: CTA bilaterally, no w/r/r. Normal respiratory effort, no increased work of breathing, breathing comfortably and speaking in full sentences on room air Abdomen: soft, nt, nd Skin: no rash or induration seen on limited exam Musculoskeletal: grossly normal tone BUE/BLE Psychiatric: grossly normal mood and affect, speech fluent and appropriate Neurologic: Answers questions appropriately and moves extremities equally.           Labs on Admission:  Basic Metabolic Panel:  Recent Labs Lab 08/04/14 0452  NA 139  K 4.1  CL 105  CO2 26  GLUCOSE 149*  BUN 18  CREATININE 0.86  CALCIUM 9.4   Liver Function Tests: No results for input(s): AST, ALT, ALKPHOS, BILITOT, PROT, ALBUMIN in the last 168 hours. No results for input(s): LIPASE, AMYLASE in the last 168 hours. No results for input(s): AMMONIA in the last 168 hours. CBC:  Recent Labs Lab 08/04/14 0452  WBC 9.0  HGB 13.6  HCT 40.8  MCV 89.3  PLT 436*   Cardiac Enzymes: No results for input(s): CKTOTAL, CKMB, CKMBINDEX, TROPONINI in the last 168 hours.  BNP (last 3 results) No results for input(s): BNP in the last 8760 hours.  ProBNP (last 3 results) No results for input(s): PROBNP in the last 8760 hours.  CBG: No results for input(s): GLUCAP in the last 168 hours.  Radiological Exams on Admission: No results found.   Assessment/Plan Principal Problem:   Angioedema - At this juncture patient has been treated for mast cell-mediated angioedema without much improvement although of note her condition has not worsened with treatment that  has been administered. My concern is for bradykinin-mediated angioedema and as such will add different treatment therapy. We'll not administer FFP as this is second line and can actually lead to worsening edema. At this juncture we'll try Purified C1 inhibitor concentrate which was discussed with pharmacy. Will consult pharmacy for appropriate dosing. - Given less likely that patient may have allergic cell-mediated angioedema will also continue H1 and H2 antihistamines and Solu-Medrol. - We'll monitor and maintain nothing by mouth and place on maintenance IV fluids  Active Problems:   HTN (hypertension) - We'll hold antihypertensive medications and monitor blood pressures every 4 hours. - Discontinue Ace inhibitors    Lipid disorder - Continue statin, stable    GERD (gastroesophageal reflux disease) - , Stable    Code Status: full DVT Prophylaxis: Heparin Family Communication:  Disposition Plan:  Time spent: > 70 minutes  Velvet Bathe [facility] Pager [phone]

## 2014-08-04 NOTE — Progress Notes (Signed)
MEDICATION RELATED CONSULT NOTE - INITIAL   Pharmacy Consult for Berinert Indication: Angioedema  Allergies  Allergen Reactions  . Coconut Oil     HIVES  . Codeine Other (See Comments)    Upsets stomach really bad  . Trandolapril Swelling    Swelling of the face/lips 07/2014    Patient Measurements: Height: 5\' 8"  (172.7 cm) Weight: 170 lb (77.111 kg) IBW/kg (Calculated) : 63.9   Vital Signs: Temp: 98.5 F (36.9 C) (02/18 0417) Temp Source: Oral (02/18 0417) BP: 135/70 mmHg (02/18 0532) Pulse Rate: 92 (02/18 0532)  Labs:  Recent Labs  08/04/14 0452  WBC 9.0  HGB 13.6  HCT 40.8  PLT 436*  CREATININE 0.86   Estimated Creatinine Clearance: 69.3 mL/min (by C-G formula based on Cr of 0.86).  Medical History: Past Medical History  Diagnosis Date  . Reflux   . Hypertension   . Hyperlipidemia   . GERD (gastroesophageal reflux disease)   . Allergy     Medications:  Scheduled:  . C1 esterase inhibitor (Human)  20 Units/kg Intravenous Once   Assessment: 67 yoF presented to ED on 2/18 AM with allergic reaction, lip and facial swelling.  The patient denies recent medication or food changes.  Chronic home med, trandolapril, is  suspected for reaction.  Last dose taken on 08/03/14.  She states her symptoms began on 2/17 AM and progressed despite PO Benadryl at home.  She went to urgent care on 2/17 PM and received solumedrol IM, Zantac PO, Zyrtec PO but did not have resolution of symptoms.  In the ED, treatment included Epinephrine, Pepcid, and Benadryl.  FFP transfusion was canceled. Pharmacy is consulted to dose C1 esterase inhibitor as prior therapies have not improved her symptoms.  Goal of Therapy:  Resolution of angioedema.  Plan:   Berinert 20 units/kg, rounded to 1500 units IV once.  Pharmacy will sign off note writing, but continue to follow peripherally.  Recommend close monitoring for hypersensitivity reactions and/or thrombotic events.  Gretta Arab PharmD, BCPS Pager [phone] 08/04/2014 9:31 AM

## 2014-08-05 DIAGNOSIS — T464X5D Adverse effect of angiotensin-converting-enzyme inhibitors, subsequent encounter: Secondary | ICD-10-CM

## 2014-08-05 DIAGNOSIS — T783XXD Angioneurotic edema, subsequent encounter: Secondary | ICD-10-CM

## 2014-08-05 LAB — BASIC METABOLIC PANEL
Anion gap: 5 (ref 5–15)
BUN: 16 mg/dL (ref 6–23)
CHLORIDE: 110 mmol/L (ref 96–112)
CO2: 26 mmol/L (ref 19–32)
CREATININE: 0.82 mg/dL (ref 0.50–1.10)
Calcium: 9.1 mg/dL (ref 8.4–10.5)
GFR calc non Af Amer: 72 mL/min — ABNORMAL LOW (ref >90)
GFR, EST AFRICAN AMERICAN: 84 mL/min — AB (ref >90)
Glucose, Bld: 103 mg/dL — ABNORMAL HIGH (ref 70–99)
POTASSIUM: 3.9 mmol/L (ref 3.5–5.1)
Sodium: 141 mmol/L (ref 135–145)

## 2014-08-05 LAB — CBC
HCT: 37 % (ref 36.0–46.0)
HEMOGLOBIN: 12.2 g/dL (ref 12.0–15.0)
MCH: 29.8 pg (ref 26.0–34.0)
MCHC: 33 g/dL (ref 30.0–36.0)
MCV: 90.2 fL (ref 78.0–100.0)
Platelets: 408 10*3/uL — ABNORMAL HIGH (ref 150–400)
RBC: 4.1 MIL/uL (ref 3.87–5.11)
RDW: 14.7 % (ref 11.5–15.5)
WBC: 19.9 10*3/uL — ABNORMAL HIGH (ref 4.0–10.5)

## 2014-08-05 LAB — PREPARE FRESH FROZEN PLASMA: UNIT DIVISION: 0

## 2014-08-05 LAB — GLUCOSE, CAPILLARY
Glucose-Capillary: 104 mg/dL — ABNORMAL HIGH (ref 70–99)
Glucose-Capillary: 101 mg/dL — ABNORMAL HIGH (ref 70–99)

## 2014-08-05 MED ORDER — AMLODIPINE BESYLATE 5 MG PO TABS
5.0000 mg | ORAL_TABLET | ORAL | Status: AC
  Administered 2014-08-05: 5 mg via ORAL
  Filled 2014-08-05: qty 1

## 2014-08-05 MED ORDER — AMLODIPINE BESYLATE 10 MG PO TABS: 10.0000 mg | ORAL_TABLET | Freq: Every day | ORAL | Status: DC

## 2014-08-05 MED ORDER — PREDNISONE 50 MG PO TABS: ORAL_TABLET | ORAL | Status: DC

## 2014-08-05 MED ORDER — FAMOTIDINE 20 MG PO TABS: 20.0000 mg | ORAL_TABLET | Freq: Two times a day (BID) | ORAL | Status: DC

## 2014-08-05 MED ORDER — DIPHENHYDRAMINE HCL 25 MG PO TABS: 25.0000 mg | ORAL_TABLET | Freq: Every day | ORAL | Status: DC

## 2014-08-05 MED ORDER — AMLODIPINE BESYLATE 5 MG PO TABS
5.0000 mg | ORAL_TABLET | Freq: Every day | ORAL | Status: DC
  Administered 2014-08-05: 5 mg via ORAL
  Filled 2014-08-05: qty 1

## 2014-08-05 NOTE — Discharge Summary (Signed)
Physician Discharge Summary  Christina [name] ZOX:[id number] DOB: 1945/08/04 DOA: 08/04/2014  PCP: Kennon Portela, MD  Admit date: 08/04/2014 Discharge date: 08/05/2014  Time spent: > 35 minutes  Recommendations for Outpatient Follow-up:  1. Please reassess blood pressures as her blood pressure medication was recently changed 2. Also patient will be discharged on prednisone, benadryl, and pepcid for the next week (prednisone for only 5 days)  3. Avoid any ACEI in the future secondary to angioedema  Discharge Diagnoses:  Principal Problem:   Angioedema Active Problems:   HTN (hypertension)   Lipid disorder   GERD (gastroesophageal reflux disease)   ACE inhibitor-aggravated angioedema   Discharge Condition: stable  Diet recommendation: Heart healthy diet  Filed Weights   08/04/14 0417  Weight: 77.111 kg (170 lb)    History of present illness:  From original HPI: African-American female with history of hypertension, hyperlipidemia and GERD. Presenting to the ED after patient developing worsening facial swelling particularly her lips. She denies any throat involvement and denies any difficulty swallowing or breathing. Reportedly patient has taken an ACE inhibitor for years which was discontinued yesterday per EMR notes. The patient saw her PCP and was given steroids and H1 and H2 antihistamines without much improvement as such she presented to the hospital for further evaluation recommendations. Other than the facial edema particularly at the lips patient did not have any other complaints.  While in the ED patient received epinephrine Pepcid and more Benadryl. We were consulted for further evaluation and recommendations.  Hospital Course:  Angioedema - improved on benadryl, pepcid, steroids, and Purified C1 inhibitor concentrate   Leukocytosis - secondary to steroid administration. Patient has no new complaints suspicious for infection and she is  afebrile  Procedures:  none   Consultations:  None  Discharge Exam: Filed Vitals:   08/05/14 1007  BP: 168/83  Pulse:   Temp:   Resp:     General: Pt in nad, alert and awake Cardiovascular: rrr, no mrg Respiratory: cta bl, no wheezes  Discharge Instructions   Discharge Instructions    Call MD for:  difficulty breathing, headache or visual disturbances    Complete by:  As directed      Call MD for:  extreme fatigue    Complete by:  As directed      Call MD for:  temperature >100.4    Complete by:  As directed      Diet - low sodium heart healthy    Complete by:  As directed      Discharge instructions    Complete by:  As directed   Please be sure to follow up with your primary care physician in 1-2 weeks or sooner should any new concerns arise     Increase activity slowly    Complete by:  As directed           Current Discharge Medication List    START taking these medications   Details  diphenhydrAMINE (BENADRYL) 25 MG tablet Take 1 tablet (25 mg total) by mouth daily. Qty: 7 tablet, Refills: 0    famotidine (PEPCID) 20 MG tablet Take 1 tablet (20 mg total) by mouth 2 (two) times daily. Qty: 14 tablet, Refills: 0    predniSONE (DELTASONE) 50 MG tablet Take 1 tablet by mouth daily Qty: 4 tablet, Refills: 0      CONTINUE these medications which have CHANGED   Details  amLODipine (NORVASC) 10 MG tablet Take 1 tablet (10 mg total) by mouth  daily. Qty: 30 tablet, Refills: 0   Associated Diagnoses: Essential hypertension; Annual physical exam; Hyperlipidemia; Screening for breast cancer; Screening for colon cancer      CONTINUE these medications which have NOT CHANGED   Details  Chlorpheniramine-APAP 2-325 MG TABS Take 1 tablet by mouth every 4 (four) hours as needed (cold symptoms).    omeprazole (PRILOSEC OTC) 20 MG tablet Take 20 mg by mouth daily.    simvastatin (ZOCOR) 20 MG tablet Take 1 tablet (20 mg total) by mouth daily at 6 PM. Qty: 90  tablet, Refills: 3   Associated Diagnoses: Other and unspecified hyperlipidemia; Annual physical exam; Hyperlipidemia; Unspecified essential hypertension; Screening for breast cancer; Screening for colon cancer; Essential hypertension    albuterol (PROVENTIL HFA;VENTOLIN HFA) 108 (90 BASE) MCG/ACT inhaler Use 2 inhalations every 4-6 hours as needed for cough wheezing Qty: 1 Inhaler, Refills: 1   Associated Diagnoses: Asthmatic bronchitis; Cough    EPINEPHrine (EPIPEN) 0.3 mg/0.3 mL DEVI Inject 0.3 mLs (0.3 mg total) into the muscle once. Qty: 1 Device, Refills: 5   Associated Diagnoses: Allergic reaction       Allergies  Allergen Reactions  . Coconut Oil     HIVES  . Codeine Other (See Comments)    Upsets stomach really bad  . Trandolapril Swelling    Swelling of the face/lips 07/2014      The results of significant diagnostics from this hospitalization (including imaging, microbiology, ancillary and laboratory) are listed below for reference.    Significant Diagnostic Studies: No results found.  Microbiology: Recent Results (from the past 240 hour(s))  MRSA PCR Screening     Status: None   Collection Time: 08/04/14  7:40 AM  Result Value Ref Range Status   MRSA by PCR NEGATIVE NEGATIVE Final    Comment:        The GeneXpert MRSA Assay (FDA approved for NASAL specimens only), is one component of a comprehensive MRSA colonization surveillance program. It is not intended to diagnose MRSA infection nor to guide or monitor treatment for MRSA infections.      Labs: Basic Metabolic Panel:  Recent Labs Lab 08/04/14 0452 08/04/14 1000 08/05/14 0347  NA 139  --  141  K 4.1  --  3.9  CL 105  --  110  CO2 26  --  26  GLUCOSE 149*  --  103*  BUN 18  --  16  CREATININE 0.86 0.78 0.82  CALCIUM 9.4  --  9.1   Liver Function Tests: No results for input(s): AST, ALT, ALKPHOS, BILITOT, PROT, ALBUMIN in the last 168 hours. No results for input(s): LIPASE, AMYLASE in  the last 168 hours. No results for input(s): AMMONIA in the last 168 hours. CBC:  Recent Labs Lab 08/04/14 0452 08/04/14 1000 08/05/14 0347  WBC 9.0 10.7* 19.9*  HGB 13.6 13.7 12.2  HCT 40.8 40.9 37.0  MCV 89.3 89.9 90.2  PLT 436* 339 408*   Cardiac Enzymes: No results for input(s): CKTOTAL, CKMB, CKMBINDEX, TROPONINI in the last 168 hours. BNP: BNP (last 3 results) No results for input(s): BNP in the last 8760 hours.  ProBNP (last 3 results) No results for input(s): PROBNP in the last 8760 hours.  CBG:  Recent Labs Lab 08/04/14 1248 08/04/14 1748 08/04/14 2330  GLUCAP 108* 123* 104*       Signed:  Velvet Bathe  [facility] 08/05/2014, 12:00 PM

## 2014-08-09 ENCOUNTER — Encounter (HOSPITAL_COMMUNITY): Payer: Self-pay | Admitting: Emergency Medicine

## 2014-08-09 ENCOUNTER — Emergency Department (HOSPITAL_COMMUNITY)
Admission: EM | Admit: 2014-08-09 | Discharge: 2014-08-10 | Disposition: A | Discharge status: Home / Self Care | Payer: BLUE CROSS/BLUE SHIELD | Attending: Emergency Medicine | Admitting: Emergency Medicine

## 2014-08-09 DIAGNOSIS — Z87891 Personal history of nicotine dependence: Secondary | ICD-10-CM | POA: Diagnosis not present

## 2014-08-09 DIAGNOSIS — R1013 Epigastric pain: Secondary | ICD-10-CM

## 2014-08-09 DIAGNOSIS — Z9889 Other specified postprocedural states: Secondary | ICD-10-CM | POA: Insufficient documentation

## 2014-08-09 DIAGNOSIS — K219 Gastro-esophageal reflux disease without esophagitis: Secondary | ICD-10-CM | POA: Diagnosis not present

## 2014-08-09 DIAGNOSIS — E785 Hyperlipidemia, unspecified: Secondary | ICD-10-CM | POA: Diagnosis not present

## 2014-08-09 DIAGNOSIS — Z79899 Other long term (current) drug therapy: Secondary | ICD-10-CM | POA: Diagnosis not present

## 2014-08-09 DIAGNOSIS — I10 Essential (primary) hypertension: Secondary | ICD-10-CM | POA: Diagnosis not present

## 2014-08-09 DIAGNOSIS — Z7952 Long term (current) use of systemic steroids: Secondary | ICD-10-CM | POA: Diagnosis not present

## 2014-08-09 NOTE — ED Notes (Signed)
Pt reports having an acid reflux flare up for the past hour.

## 2014-08-10 ENCOUNTER — Telehealth: Payer: Self-pay | Admitting: *Deleted

## 2014-08-10 ENCOUNTER — Ambulatory Visit (INDEPENDENT_AMBULATORY_CARE_PROVIDER_SITE_OTHER): Payer: BLUE CROSS/BLUE SHIELD | Admitting: Emergency Medicine

## 2014-08-10 VITALS — BP 126/72 | HR 71 | Temp 98.1°F | Resp 18 | Ht 68.0 in | Wt 157.0 lb

## 2014-08-10 DIAGNOSIS — K219 Gastro-esophageal reflux disease without esophagitis: Secondary | ICD-10-CM

## 2014-08-10 LAB — I-STAT TROPONIN, ED: Troponin i, poc: 0 ng/mL (ref 0.00–0.08)

## 2014-08-10 MED ORDER — GI COCKTAIL ~~LOC~~
30.0000 mL | Freq: Once | ORAL | Status: AC
  Administered 2014-08-10: 30 mL via ORAL
  Filled 2014-08-10: qty 30

## 2014-08-10 MED ORDER — SUCRALFATE 1 G PO TABS: ORAL_TABLET | ORAL | Status: DC

## 2014-08-10 MED ORDER — GI COCKTAIL ~~LOC~~
30.0000 mL | Freq: Once | ORAL | Status: AC
  Administered 2014-08-10: 30 mL via ORAL

## 2014-08-10 MED ORDER — PANTOPRAZOLE SODIUM 20 MG PO TBEC: 20.0000 mg | DELAYED_RELEASE_TABLET | Freq: Every day | ORAL | Status: DC

## 2014-08-10 NOTE — Discharge Instructions (Signed)
Gastroesophageal Reflux Disease, Adult  Gastroesophageal reflux disease (GERD) happens when acid from your stomach flows up into the esophagus. When acid comes in contact with the esophagus, the acid causes soreness (inflammation) in the esophagus. Over time, GERD may create small holes (ulcers) in the lining of the esophagus.  CAUSES   · Increased body weight. This puts pressure on the stomach, making acid rise from the stomach into the esophagus.  · Smoking. This increases acid production in the stomach.  · Drinking alcohol. This causes decreased pressure in the lower esophageal sphincter (valve or ring of muscle between the esophagus and stomach), allowing acid from the stomach into the esophagus.  · Late evening meals and a full stomach. This increases pressure and acid production in the stomach.  · A malformed lower esophageal sphincter.  Sometimes, no cause is found.  SYMPTOMS   · Burning pain in the lower part of the mid-chest behind the breastbone and in the mid-stomach area. This may occur twice a week or more often.  · Trouble swallowing.  · Sore throat.  · Dry cough.  · Asthma-like symptoms including chest tightness, shortness of breath, or wheezing.  DIAGNOSIS   Your caregiver may be able to diagnose GERD based on your symptoms. In some cases, X-rays and other tests may be done to check for complications or to check the condition of your stomach and esophagus.  TREATMENT   Your caregiver may recommend over-the-counter or prescription medicines to help decrease acid production. Ask your caregiver before starting or adding any new medicines.   HOME CARE INSTRUCTIONS   · Change the factors that you can control. Ask your caregiver for guidance concerning weight loss, quitting smoking, and alcohol consumption.  · Avoid foods and drinks that make your symptoms worse, such as:  ¨ Caffeine or alcoholic drinks.  ¨ Chocolate.  ¨ Peppermint or mint flavorings.  ¨ Garlic and onions.  ¨ Spicy foods.  ¨ Citrus fruits,  such as oranges, lemons, or limes.  ¨ Tomato-based foods such as sauce, chili, salsa, and pizza.  ¨ Fried and fatty foods.  · Avoid lying down for the 3 hours prior to your bedtime or prior to taking a nap.  · Eat small, frequent meals instead of large meals.  · Wear loose-fitting clothing. Do not wear anything tight around your waist that causes pressure on your stomach.  · Raise the head of your bed 6 to 8 inches with wood blocks to help you sleep. Extra pillows will not help.  · Only take over-the-counter or prescription medicines for pain, discomfort, or fever as directed by your caregiver.  · Do not take aspirin, ibuprofen, or other nonsteroidal anti-inflammatory drugs (NSAIDs).  SEEK IMMEDIATE MEDICAL CARE IF:   · You have pain in your arms, neck, jaw, teeth, or back.  · Your pain increases or changes in intensity or duration.  · You develop nausea, vomiting, or sweating (diaphoresis).  · You develop shortness of breath, or you faint.  · Your vomit is green, yellow, black, or looks like coffee grounds or blood.  · Your stool is red, bloody, or black.  These symptoms could be signs of other problems, such as heart disease, gastric bleeding, or esophageal bleeding.  MAKE SURE YOU:   · Understand these instructions.  · Will watch your condition.  · Will get help right away if you are not doing well or get worse.  Document Released: 03/13/2005 Document Revised: 08/26/2011 Document Reviewed: 12/21/2010  ExitCare® Patient   Information ©2015 [employer]. This information is not intended to replace advice given to you by your health care provider. Make sure you discuss any questions you have with your health care provider.  Food Choices for Gastroesophageal Reflux Disease  When you have gastroesophageal reflux disease (GERD), the foods you eat and your eating habits are very important. Choosing the right foods can help ease the discomfort of GERD.  WHAT GENERAL GUIDELINES DO I NEED TO FOLLOW?  · Choose fruits,  vegetables, whole grains, low-fat dairy products, and low-fat meat, fish, and poultry.  · Limit fats such as oils, salad dressings, butter, nuts, and avocado.  · Keep a food diary to identify foods that cause symptoms.  · Avoid foods that cause reflux. These may be different for different people.  · Eat frequent small meals instead of three large meals each day.  · Eat your meals slowly, in a relaxed setting.  · Limit fried foods.  · Cook foods using methods other than frying.  · Avoid drinking alcohol.  · Avoid drinking large amounts of liquids with your meals.  · Avoid bending over or lying down until 2-3 hours after eating.  WHAT FOODS ARE NOT RECOMMENDED?  The following are some foods and drinks that may worsen your symptoms:  Vegetables  Tomatoes. Tomato juice. Tomato and spaghetti sauce. Chili peppers. Onion and garlic. Horseradish.  Fruits  Oranges, grapefruit, and lemon (fruit and juice).  Meats  High-fat meats, fish, and poultry. This includes hot dogs, ribs, ham, sausage, salami, and bacon.  Dairy  Whole milk and chocolate milk. Sour cream. Cream. Butter. Ice cream. Cream cheese.   Beverages  Coffee and tea, with or without caffeine. Carbonated beverages or energy drinks.  Condiments  Hot sauce. Barbecue sauce.   Sweets/Desserts  Chocolate and cocoa. Donuts. Peppermint and spearmint.  Fats and Oils  High-fat foods, including French fries and potato chips.  Other  Vinegar. Strong spices, such as black pepper, white pepper, red pepper, cayenne, curry powder, cloves, ginger, and chili powder.  The items listed above may not be a complete list of foods and beverages to avoid. Contact your dietitian for more information.  Document Released: 06/03/2005 Document Revised: 06/08/2013 Document Reviewed: 04/07/2013  ExitCare® Patient Information ©2015 [employer]. This information is not intended to replace advice given to you by your health care provider. Make sure you discuss any questions you have with your  health care provider.

## 2014-08-10 NOTE — Progress Notes (Signed)
[facility] [address] [place] [address] [phone]  Date:  08/10/2014   Name:  Christina [name]   DOB:  08-23-1945   MRN:  [id number]  PCP:  Kennon Portela, MD    Chief Complaint: Hospitalization Follow-up and Gastrophageal Reflux   History of Present Illness:  Christina [name] is a 69 y.o. very pleasant female patient who presents with the following:  Seen and treated for ACEI mediated angioedema. Discharged from hospital on prednisone. Experienced a worsening of GERD and sought treatment in ER yesterday.  Left ED at 0800 and went home.  Her pharmacy was not open to pick up her protonix so she came her fore "immediate relief via a 'cocktail'" She has no new symptoms.  No nausea or vomiting.  No stool change. The patient has no complaint of blood, mucous, or pus in her stools. Denies other complaint or health concern today.   Patient Active Problem List   Diagnosis Date Noted  . 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 Angioedema 08/04/2014  . GERD (gastroesophageal reflux disease) 08/10/2013  . Other and unspecified hyperlipidemia 06/23/2013  . HTN (hypertension) 07/23/2012  . Lipid disorder 07/23/2012    Past Medical History  Diagnosis Date  . Reflux   . Hypertension   . Hyperlipidemia   . GERD (gastroesophageal reflux disease)   . Allergy     Past Surgical History  Procedure Laterality Date  . Cesarean section      1 time    History  Substance Use Topics  . Smoking status: Former Smoker -- 0.50 packs/day    Types: Cigarettes    Quit date: 08/03/2013  . Smokeless tobacco: Never Used  . Alcohol Use: No    Family History  Problem Relation Age of Onset  . Hypertension Mother   . Leukemia Father     Allergies  Allergen Reactions  . Coconut Oil     HIVES  . Codeine Other (See Comments)    Upsets stomach really bad  . Trandolapril Swelling    Swelling of the face/lips 07/2014    Medication list has been  reviewed and updated.  Current Outpatient Prescriptions on File Prior to Visit  Medication Sig Dispense Refill  . albuterol (PROVENTIL HFA;VENTOLIN HFA) 108 (90 BASE) MCG/ACT inhaler Use 2 inhalations every 4-6 hours as needed for cough wheezing 1 Inhaler 1  . amLODipine (NORVASC) 10 MG tablet Take 1 tablet (10 mg total) by mouth daily. 30 tablet 0  . EPINEPHrine (EPIPEN) 0.3 mg/0.3 mL DEVI Inject 0.3 mLs (0.3 mg total) into the muscle once. 1 Device 5  . famotidine (PEPCID) 20 MG tablet Take 1 tablet (20 mg total) by mouth 2 (two) times daily. 14 tablet 0  . simvastatin (ZOCOR) 20 MG tablet Take 1 tablet (20 mg total) by mouth daily at 6 PM. 90 tablet 3  . diphenhydrAMINE (BENADRYL) 25 MG tablet Take 1 tablet (25 mg total) by mouth daily. (Patient not taking: Reported on 08/10/2014) 7 tablet 0  . pantoprazole (PROTONIX) 20 MG tablet Take 1 tablet (20 mg total) by mouth daily. (Patient not taking: Reported on 08/10/2014) 30 tablet 0  . predniSONE (DELTASONE) 50 MG tablet Take 1 tablet by mouth daily (Patient not taking: Reported on 08/10/2014) 4 tablet 0   No current facility-administered medications on file prior to visit.    Review of Systems:  As per HPI, otherwise negative.    Physical Examination: Filed Vitals:   08/10/14  1118  BP: 126/72  Pulse: 71  Temp: 98.1 F (36.7 C)  Resp: 18   Filed Vitals:   08/10/14 1118  Height: 5\' 8"  (1.727 m)  Weight: 157 lb (71.215 kg)   Body mass index is 23.88 kg/(m^2). Ideal Body Weight: Weight in (lb) to have BMI = 25: 164.1   GEN: WDWN, NAD, Non-toxic, Alert & Oriented x 3 HEENT: Atraumatic, Normocephalic.  Ears and Nose: No external deformity. EXTR: No clubbing/cyanosis/edema NEURO: Normal gait.  PSYCH: Normally interactive. Conversant. Not depressed or anxious appearing.  Calm demeanor.  ABD:  Soft not tender  Assessment and Plan: GERD aggravated by prednisone ACEI mediated angioedema Add carafate   Signed,  Ellison Carwin, MD

## 2014-08-10 NOTE — ED Provider Notes (Signed)
CSN: [id number]     Arrival date & time 08/09/14  2327 History   First MD Initiated Contact with Patient 08/10/14 [phone]     Chief Complaint  Patient presents with  . Gastrophageal Reflux     (Consider location/radiation/quality/duration/timing/severity/associated sxs/prior Treatment) Patient is a 69 y.o. female presenting with abdominal pain. The history is provided by the patient. No language interpreter was used.  Abdominal Pain Pain location:  Epigastric Pain quality: sharp   Pain radiates to:  Does not radiate Pain severity:  Moderate Associated symptoms: no chest pain, no chills, no fever, no nausea, no shortness of breath and no vomiting   Associated symptoms comment:  She reports being started on Prednisone last week while hospitalized for angioedema. She started having symptoms of sharp epigastric discomfort that was familiar to her as her symptoms of reflux. She had been on Protonix in the past.    Past Medical History  Diagnosis Date  . Reflux   . Hypertension   . Hyperlipidemia   . GERD (gastroesophageal reflux disease)   . Allergy    Past Surgical History  Procedure Laterality Date  . Cesarean section      1 time   Family History  Problem Relation Age of Onset  . Hypertension Mother   . Leukemia Father    History  Substance Use Topics  . Smoking status: Former Smoker -- 0.50 packs/day    Types: Cigarettes    Quit date: 08/03/2013  . Smokeless tobacco: Never Used  . Alcohol Use: No   OB History    No data available     Review of Systems  Constitutional: Negative for fever and chills.  HENT: Negative.   Respiratory: Negative.  Negative for shortness of breath.   Cardiovascular: Negative.  Negative for chest pain.  Gastrointestinal: Positive for abdominal pain. Negative for nausea and vomiting.  Musculoskeletal: Negative.   Skin: Negative.   Neurological: Negative.       Allergies  Coconut oil; Codeine; and Trandolapril  Home Medications    Prior to Admission medications   Medication Sig Start Date End Date Taking? Authorizing Provider  albuterol (PROVENTIL HFA;VENTOLIN HFA) 108 (90 BASE) MCG/ACT inhaler Use 2 inhalations every 4-6 hours as needed for cough wheezing 08/03/13  Yes Posey Boyer, MD  amLODipine (NORVASC) 10 MG tablet Take 1 tablet (10 mg total) by mouth daily. 08/05/14  Yes Velvet Bathe, MD  diphenhydrAMINE (BENADRYL) 25 MG tablet Take 1 tablet (25 mg total) by mouth daily. 08/06/14  Yes Velvet Bathe, MD  EPINEPHrine (EPIPEN) 0.3 mg/0.3 mL DEVI Inject 0.3 mLs (0.3 mg total) into the muscle once. 11/16/11  Yes Darlyne Russian, MD  famotidine (PEPCID) 20 MG tablet Take 1 tablet (20 mg total) by mouth 2 (two) times daily. 08/06/14  Yes Velvet Bathe, MD  predniSONE (DELTASONE) 50 MG tablet Take 1 tablet by mouth daily Patient taking differently: Take 50 mg by mouth daily. Take 1 tablet by mouth daily 08/06/14  Yes Velvet Bathe, MD  simvastatin (ZOCOR) 20 MG tablet Take 1 tablet (20 mg total) by mouth daily at 6 PM. 02/14/14  Yes Orma Flaming, MD   BP 155/94 mmHg  Pulse 57  Temp(Src) 98.1 F (36.7 C) (Oral)  Resp 18  SpO2 99% Physical Exam  Constitutional: She appears well-developed and well-nourished.  HENT:  Head: Normocephalic.  Neck: Normal range of motion. Neck supple.  Cardiovascular: Normal rate and regular rhythm.   Pulmonary/Chest: Effort normal and breath sounds normal.  Abdominal: Soft. Bowel sounds are normal. There is no tenderness. There is no rebound and no guarding.  Musculoskeletal: Normal range of motion.  Neurological: She is alert.  Skin: Skin is warm and dry. No rash noted.  Psychiatric: She has a normal mood and affect.    ED Course  Procedures (including critical care time) Labs Review Labs Reviewed - No data to display  Imaging Review No results found.   EKG Interpretation None      MDM   Final diagnoses:  None    1. Epigastric pain 2. GERD  Negative cardiac evaluation  (normal EKG, troponin) in patient with symptoms familiar to her as reflux. Feel dyspepsia likely with new regimen of steroids. Will place back on protonix and encourage pcp follow up.    Dewaine Oats, PA-C 08/10/14 [place], MD 08/10/14 [phone]

## 2014-08-10 NOTE — Telephone Encounter (Signed)
Pt called stating her medication was never sent over to her pharmacy [facility]).  I called the pharmacy and they stated the same thing.  I verbally told the pharmacy the medication and was advised they would start working on it.  I called the patient back and told her it had been corrected.  Pt understood.

## 2014-08-10 NOTE — Patient Instructions (Signed)
Gastroesophageal Reflux Disease, Adult Gastroesophageal reflux disease (GERD) happens when acid from your stomach flows up into the esophagus. When acid comes in contact with the esophagus, the acid causes soreness (inflammation) in the esophagus. Over time, GERD may create small holes (ulcers) in the lining of the esophagus. CAUSES   Increased body weight. This puts pressure on the stomach, making acid rise from the stomach into the esophagus.  Smoking. This increases acid production in the stomach.  Drinking alcohol. This causes decreased pressure in the lower esophageal sphincter (valve or ring of muscle between the esophagus and stomach), allowing acid from the stomach into the esophagus.  Late evening meals and a full stomach. This increases pressure and acid production in the stomach.  A malformed lower esophageal sphincter. Sometimes, no cause is found. SYMPTOMS   Burning pain in the lower part of the mid-chest behind the breastbone and in the mid-stomach area. This may occur twice a week or more often.  Trouble swallowing.  Sore throat.  Dry cough.  Asthma-like symptoms including chest tightness, shortness of breath, or wheezing. DIAGNOSIS  Your caregiver may be able to diagnose GERD based on your symptoms. In some cases, X-rays and other tests may be done to check for complications or to check the condition of your stomach and esophagus. TREATMENT  Your caregiver may recommend over-the-counter or prescription medicines to help decrease acid production. Ask your caregiver before starting or adding any new medicines.  HOME CARE INSTRUCTIONS   Change the factors that you can control. Ask your caregiver for guidance concerning weight loss, quitting smoking, and alcohol consumption.  Avoid foods and drinks that make your symptoms worse, such as:  Caffeine or alcoholic drinks.  Chocolate.  Peppermint or mint flavorings.  Garlic and onions.  Spicy foods.  Citrus fruits,  such as oranges, lemons, or limes.  Tomato-based foods such as sauce, chili, salsa, and pizza.  Fried and fatty foods.  Avoid lying down for the 3 hours prior to your bedtime or prior to taking a nap.  Eat small, frequent meals instead of large meals.  Wear loose-fitting clothing. Do not wear anything tight around your waist that causes pressure on your stomach.  Raise the head of your bed 6 to 8 inches with wood blocks to help you sleep. Extra pillows will not help.  Only take over-the-counter or prescription medicines for pain, discomfort, or fever as directed by your caregiver.  Do not take aspirin, ibuprofen, or other nonsteroidal anti-inflammatory drugs (NSAIDs). SEEK IMMEDIATE MEDICAL CARE IF:   You have pain in your arms, neck, jaw, teeth, or back.  Your pain increases or changes in intensity or duration.  You develop nausea, vomiting, or sweating (diaphoresis).  You develop shortness of breath, or you faint.  Your vomit is green, yellow, black, or looks like coffee grounds or blood.  Your stool is red, bloody, or black. These symptoms could be signs of other problems, such as heart disease, gastric bleeding, or esophageal bleeding. MAKE SURE YOU:   Understand these instructions.  Will watch your condition.  Will get help right away if you are not doing well or get worse. Document Released: 03/13/2005 Document Revised: 08/26/2011 Document Reviewed: 12/21/2010 ExitCare Patient Information 2015 [employer]. This information is not intended to replace advice given to you by your health care provider. Make sure you discuss any questions you have with your health care provider.  

## 2014-08-10 NOTE — Addendum Note (Signed)
Addended by: Jerl Santos R on: 08/10/2014 12:30 PM   Modules accepted: Orders

## 2014-08-20 ENCOUNTER — Ambulatory Visit (INDEPENDENT_AMBULATORY_CARE_PROVIDER_SITE_OTHER): Payer: BLUE CROSS/BLUE SHIELD | Admitting: Urgent Care

## 2014-08-20 ENCOUNTER — Ambulatory Visit (INDEPENDENT_AMBULATORY_CARE_PROVIDER_SITE_OTHER): Payer: BLUE CROSS/BLUE SHIELD

## 2014-08-20 VITALS — BP 130/82 | HR 76 | Temp 98.0°F | Ht 67.0 in | Wt 158.2 lb

## 2014-08-20 DIAGNOSIS — R062 Wheezing: Secondary | ICD-10-CM

## 2014-08-20 DIAGNOSIS — R05 Cough: Secondary | ICD-10-CM | POA: Diagnosis not present

## 2014-08-20 DIAGNOSIS — J40 Bronchitis, not specified as acute or chronic: Secondary | ICD-10-CM

## 2014-08-20 DIAGNOSIS — F419 Anxiety disorder, unspecified: Secondary | ICD-10-CM | POA: Diagnosis not present

## 2014-08-20 DIAGNOSIS — R059 Cough, unspecified: Secondary | ICD-10-CM

## 2014-08-20 DIAGNOSIS — R0981 Nasal congestion: Secondary | ICD-10-CM

## 2014-08-20 LAB — POCT CBC
Granulocyte percent: 49.2 %G (ref 37–80)
HCT, POC: 40.9 % (ref 37.7–47.9)
Hemoglobin: 13.2 g/dL (ref 12.2–16.2)
LYMPH, POC: 2.6 (ref 0.6–3.4)
MCH: 29.8 pg (ref 27–31.2)
MCHC: 32.2 g/dL (ref 31.8–35.4)
MCV: 92.3 fL (ref 80–97)
MID (CBC): 0.3 (ref 0–0.9)
MPV: 7.9 fL (ref 0–99.8)
POC GRANULOCYTE: 2.9 (ref 2–6.9)
POC LYMPH %: 45.3 % (ref 10–50)
POC MID %: 5.5 %M (ref 0–12)
Platelet Count, POC: 343 10*3/uL (ref 142–424)
RBC: 4.44 M/uL (ref 4.04–5.48)
RDW, POC: 16.9 %
WBC: 5.8 10*3/uL (ref 4.6–10.2)

## 2014-08-20 MED ORDER — IPRATROPIUM BROMIDE 0.03 % NA SOLN: 2.0000 | Freq: Two times a day (BID) | NASAL | Status: DC

## 2014-08-20 MED ORDER — PREDNISONE 20 MG PO TABS: 20.0000 mg | ORAL_TABLET | Freq: Every day | ORAL | Status: AC

## 2014-08-20 MED ORDER — FLUOXETINE HCL 20 MG PO TABS: 20.0000 mg | ORAL_TABLET | Freq: Every day | ORAL | Status: DC

## 2014-08-20 MED ORDER — LORAZEPAM 0.5 MG PO TABS: 0.5000 mg | ORAL_TABLET | Freq: Two times a day (BID) | ORAL | Status: DC | PRN

## 2014-08-20 MED ORDER — HYDROCODONE-HOMATROPINE 5-1.5 MG/5ML PO SYRP: 5.0000 mL | ORAL_SOLUTION | Freq: Every evening | ORAL | Status: DC | PRN

## 2014-08-20 NOTE — Progress Notes (Signed)
MRN: [id number] DOB: 09/10/1945  Subjective:   Christina [name] is a 70 y.o. female presenting for chief complaint of Nasal Congestion and anxiety attacks.  Congestion - reports 3 day history of sinus congestion, cough intermittent productive clear sputum worse at night when lying down, interrupting her sleep, subjective night time fevers. Has also had watery eyes, rhinorrhea. Has used her albuterol inhaler ~4 times per day since symptoms started. Has tried chlorseden with minimal relief. Denies sinus headache, eye pain, red eyes, ear pain, ear drainage, tooth pain, sore throat, chest pain, chest tightness, shob, wheezing, abdominal pain, n/v, myalgias. Has had sick contacts with her grandchildren. Denies smoking or alcohol use. Denies any other aggravating or relieving factors, no other questions or concerns.  Anxiety - reports 3 month history of "panic attacks" 1-2 times a week, reports that she gets really nervous randomly, gets shaky, heart racing, hyperventilates. Denies fearful thoughts, thoughts of dying, n/v, sweating. States that her sister has similar episodes, is seen here at [facility] for same problem. Patient's sister has prescription for Ativan, has given a couple of doses to patient with significant relief. Feels like this helps her to calm down. Denies depressed mood, thoughts about death, feelings of guilt, irritability, psychomotor agitation or retardation, decreased appetite, weight gain or weight loss.    Christina [name] has a current medication list which includes the following prescription(s): albuterol, amlodipine, epinephrine, pantoprazole, simvastatin, and sucralfate. She is allergic to coconut oil; codeine; and trandolapril.  Christina [name]  has a past medical history of Reflux; Hypertension; Hyperlipidemia; GERD (gastroesophageal reflux disease); and Allergy. Also  has past surgical history that includes Cesarean section.  ROS As in subjective.  Objective:    Vitals: BP 130/82 mmHg  Pulse 76  Temp(Src) 98 F (36.7 C) (Oral)  Ht 5\' 7"  (1.702 m)  Wt 158 lb 4 oz (71.782 kg)  BMI 24.78 kg/m2  SpO2 93%  Physical Exam  Constitutional: She is oriented to person, place, and time and well-developed, well-nourished, and in no distress.  HENT:  TM's flat bilaterally, no effusions or erythema. Nasal turbinates boggy and edematous, with clear-yellow rhinorrhea. No sinus tenderness. No postnasal drip, oropharyngeal erythema, exudates or tonsillar edema.  Eyes: Conjunctivae are normal. Right eye exhibits no discharge. Left eye exhibits no discharge. No scleral icterus.  Neck: Normal range of motion. No thyromegaly present.  Cardiovascular: Normal rate, regular rhythm and intact distal pulses.  Exam reveals no gallop and no friction rub.   No murmur heard. Pulmonary/Chest: Effort normal. No stridor. No respiratory distress. She has wheezes (left mid-lower lung). She has no rales. She exhibits no tenderness.  Lymphadenopathy:    She has no cervical adenopathy.  Neurological: She is alert and oriented to person, place, and time.  Skin: Skin is warm and dry. No rash noted. No erythema. No pallor.   Results for orders placed or performed in visit on 08/20/14 (from the past 24 hour(s))  POCT CBC     Status: None   Collection Time: 08/20/14  9:02 AM  Result Value Ref Range   WBC 5.8 4.6 - 10.2 K/uL   Lymph, poc 2.6 0.6 - 3.4   POC LYMPH PERCENT 45.3 10 - 50 %L   MID (cbc) 0.3 0 - 0.9   POC MID % 5.5 0 - 12 %M   POC Granulocyte 2.9 2 - 6.9   Granulocyte percent 49.2 37 - 80 %G   RBC 4.44 4.04 - 5.48 M/uL  Hemoglobin 13.2 12.2 - 16.2 g/dL   HCT, POC 40.9 37.7 - 47.9 %   MCV 92.3 80 - 97 fL   MCH, POC 29.8 27 - 31.2 pg   MCHC 32.2 31.8 - 35.4 g/dL   RDW, POC 16.9 %   Platelet Count, POC 343 142 - 424 K/uL   MPV 7.9 0 - 99.8 fL    [facility] reading (PRIMARY) by  Dr. Lorelei Pont and PA-Bracy Pepper. Chest: No signs of infiltrates. Normal chest x-ray.  Dg Chest  2 View  08/20/2014   CLINICAL DATA:  Three-day history of sinus congestion and intermittent cough. Initial encounter.  EXAM: CHEST  2 VIEW  COMPARISON:  08/10/2013 and 11/16/2012.  FINDINGS: The heart size and mediastinal contours are stable. There is minimal residual chronic density in the right middle lobe on the lateral view, improved from prior studies. No airspace disease, edema or pleural effusion is demonstrated. The bones appear unchanged.  IMPRESSION: No active cardiopulmonary process.   Electronically Signed   By: Richardean Sale M.D.   On: 08/20/2014 10:15   Assessment and Plan :   1. Nasal congestion 2. Cough 3. Expiratory wheezing 4. Bronchitis - Advised to start Prednisone course, 40mg  x5 days in the morning with food. Atrovent for nasal congestion. - Will hold off on antibiotic for now, advised patient to let me know if she develops worsening symptoms, will rx azithromycin  5. Anxiety - Discussed risks and benefits of starting medication for anxiety including maintenance therapy and meds for breakthrough anxiety. Patient agreed to start trial of Prozac daily for 6 six weeks, lorazepam 0.5mg  up to twice daily as needed.  - Will follow up with me in 6 weeks.  Jaynee Eagles, PA-C [facility] [phone] 08/20/2014 9:03 AM

## 2014-08-20 NOTE — Patient Instructions (Signed)
Generalized Anxiety Disorder Generalized anxiety disorder (GAD) is a mental disorder. It interferes with life functions, including relationships, work, and school. GAD is different from normal anxiety, which everyone experiences at some point in their lives in response to specific life events and activities. Normal anxiety actually helps [place] prepare for and get through these life events and activities. Normal anxiety goes away after the event or activity is over.  GAD causes anxiety that is not necessarily related to specific events or activities. It also causes excess anxiety in proportion to specific events or activities. The anxiety associated with GAD is also difficult to control. GAD can vary from mild to severe. People with severe GAD can have intense waves of anxiety with physical symptoms (panic attacks).  SYMPTOMS The anxiety and worry associated with GAD are difficult to control. This anxiety and worry are related to many life events and activities and also occur more days than not for 6 months or longer. People with GAD also have three or more of the following symptoms (one or more in children):  Restlessness.   Fatigue.  Difficulty concentrating.   Irritability.  Muscle tension.  Difficulty sleeping or unsatisfying sleep. DIAGNOSIS GAD is diagnosed through an assessment by your health care provider. Your health care provider will ask you questions aboutyour mood,physical symptoms, and events in your life. Your health care provider may ask you about your medical history and use of alcohol or drugs, including prescription medicines. Your health care provider may also do a physical exam and blood tests. Certain medical conditions and the use of certain substances can cause symptoms similar to those associated with GAD. Your health care provider may refer you to a mental health specialist for further evaluation. TREATMENT The following therapies are usually used to treat GAD:    Medication. Antidepressant medication usually is prescribed for long-term daily control. Antianxiety medicines may be added in severe cases, especially when panic attacks occur.   Talk therapy (psychotherapy). Certain types of talk therapy can be helpful in treating GAD by providing support, education, and guidance. A form of talk therapy called cognitive behavioral therapy can teach you healthy ways to think about and react to daily life events and activities.  Stress managementtechniques. These include yoga, meditation, and exercise and can be very helpful when they are practiced regularly. A mental health specialist can help determine which treatment is best for you. Some people see improvement with one therapy. However, other people require a combination of therapies. Document Released: 09/28/2012 Document Revised: 10/18/2013 Document Reviewed: 09/28/2012 [facility] Patient Information 2015 [place], [place]. This information is not intended to replace advice given to you by your health care provider. Make sure you discuss any questions you have with your health care provider.    Lorazepam tablets What is this medicine? LORAZEPAM (lor A ze pam) is a benzodiazepine. It is used to treat anxiety. This medicine may be used for other purposes; ask your health care provider or pharmacist if you have questions. COMMON BRAND NAME(S): Ativan What should I tell my health care provider before I take this medicine? They need to know if you have any of these conditions: -alcohol or drug abuse problem -bipolar disorder, depression, psychosis or other mental health condition -glaucoma -kidney or liver disease -lung disease or breathing difficulties -myasthenia gravis -Parkinson's disease -seizures or a history of seizures -suicidal thoughts -an unusual or allergic reaction to lorazepam, other benzodiazepines, foods, dyes, or preservatives -pregnant or trying to get pregnant -breast-feeding How  should I  use this medicine? Take this medicine by mouth with a glass of water. Follow the directions on the prescription label. If it upsets your stomach, take it with food or milk. Take your medicine at regular intervals. Do not take it more often than directed. Do not stop taking except on the advice of your doctor or health care professional. Talk to your pediatrician regarding the use of this medicine in children. Special care may be needed. Overdosage: If you think you have taken too much of this medicine contact a poison control center or emergency room at once. NOTE: This medicine is only for you. Do not share this medicine with others. What if I miss a dose? If you miss a dose, take it as soon as you can. If it is almost time for your next dose, take only that dose. Do not take double or extra doses. What may interact with this medicine? -barbiturate medicines for inducing sleep or treating seizures, like phenobarbital -clozapine -medicines for depression, mental problems or psychiatric disturbances -medicines for sleep -phenytoin -probenecid -theophylline -valproic acid This list may not describe all possible interactions. Give your health care provider a list of all the medicines, herbs, non-prescription drugs, or dietary supplements you use. Also tell them if you smoke, drink alcohol, or use illegal drugs. Some items may interact with your medicine. What should I watch for while using this medicine? Visit your doctor or health care professional for regular checks on your progress. Your body may become dependent on this medicine, ask your doctor or health care professional if you still need to take it. However, if you have been taking this medicine regularly for some time, do not suddenly stop taking it. You must gradually reduce the dose or you may get severe side effects. Ask your doctor or health care professional for advice before increasing or decreasing the dose. Even after you stop  taking this medicine it can still affect your body for several days. You may get drowsy or dizzy. Do not drive, use machinery, or do anything that needs mental alertness until you know how this medicine affects you. To reduce the risk of dizzy and fainting spells, do not stand or sit up quickly, especially if you are an older patient. Alcohol may increase dizziness and drowsiness. Avoid alcoholic drinks. Do not treat yourself for coughs, colds or allergies without asking your doctor or health care professional for advice. Some ingredients can increase possible side effects. What side effects may I notice from receiving this medicine? Side effects that you should report to your doctor or health care professional as soon as possible: -changes in vision -confusion -depression -mood changes, excitability or aggressive behavior -movement difficulty, staggering or jerky movements -muscle cramps -restlessness -weakness or tiredness Side effects that usually do not require medical attention (report to your doctor or health care professional if they continue or are bothersome): -constipation or diarrhea -difficulty sleeping, nightmares -dizziness, drowsiness -headache -nausea, vomiting This list may not describe all possible side effects. Call your doctor for medical advice about side effects. You may report side effects to FDA at 1-800-FDA-1088. Where should I keep my medicine? Keep out of the reach of children. This medicine can be abused. Keep your medicine in a safe place to protect it from theft. Do not share this medicine with anyone. Selling or giving away this medicine is dangerous and against the law. Store at room temperature between 20 and 25 degrees C (68 and 77 degrees F). Protect from light. Keep  container tightly closed. Throw away any unused medicine after the expiration date. NOTE: This sheet is a summary. It may not cover all possible information. If you have questions about this  medicine, talk to your doctor, pharmacist, or health care provider.  2015, [employer]vier/[employer] Standard. (2007-12-04 14:58:20)

## 2014-09-24 ENCOUNTER — Ambulatory Visit (INDEPENDENT_AMBULATORY_CARE_PROVIDER_SITE_OTHER): Payer: BLUE CROSS/BLUE SHIELD | Admitting: Family Medicine

## 2014-09-24 VITALS — BP 130/88 | HR 65 | Resp 18 | Ht 67.5 in | Wt 159.0 lb

## 2014-09-24 DIAGNOSIS — R05 Cough: Secondary | ICD-10-CM | POA: Diagnosis not present

## 2014-09-24 DIAGNOSIS — R197 Diarrhea, unspecified: Secondary | ICD-10-CM | POA: Diagnosis not present

## 2014-09-24 DIAGNOSIS — K219 Gastro-esophageal reflux disease without esophagitis: Secondary | ICD-10-CM

## 2014-09-24 DIAGNOSIS — J069 Acute upper respiratory infection, unspecified: Secondary | ICD-10-CM | POA: Diagnosis not present

## 2014-09-24 DIAGNOSIS — R059 Cough, unspecified: Secondary | ICD-10-CM

## 2014-09-24 MED ORDER — BENZONATATE 100 MG PO CAPS: 100.0000 mg | ORAL_CAPSULE | Freq: Three times a day (TID) | ORAL | Status: DC | PRN

## 2014-09-24 MED ORDER — OMEPRAZOLE 40 MG PO CPDR: 40.0000 mg | DELAYED_RELEASE_CAPSULE | Freq: Every day | ORAL | Status: DC

## 2014-09-24 MED ORDER — SUCRALFATE 1 G PO TABS: ORAL_TABLET | ORAL | Status: DC

## 2014-09-24 NOTE — Progress Notes (Signed)
Subjective: 69 year old lady who's been having problems with reflux. She is no longer on her Protonix though she does have some Carafate still. She hurts in her epigastrium. She has not been vomiting any. She also has been sick over the last few days with a little bit of a head cold and now some cough. It's not been bad but it has been a problem. She had diarrhea for for 5 days, though that seems to have let up some. She wondered whether she can get some Prilosec. She does not smoke. She's had some problems with her nerves but the doing better now. She is married. Has had some sick children and grandchildren. Husband is not ill. She has a part-time job in an evening at school.  Objective: Pleasant lady, appears healthy. Her TMs are normal. Throat clear. Neck supple without significant nodes. Chest is clear to auscultation. Heart regular without any murmurs. Abdomen is mildly tender in the epigastrium. Soft without masses or tenderness elsewhere. No CVA tenderness. Her temperature was 9 8.0  Assessment: Viral URI Diarrhea, resolving GERD  Plan: Prilosec stomach Benzonatate for the cough If the stomach is given her troubles we will have to check it out further, so she is to let us know.

## 2014-09-24 NOTE — Patient Instructions (Addendum)
Take the Prilosec (omeprazole) one pill daily for stomach and heartburn.  Take the benzonatate (Tessalon) 1 or 2 pills 3 times daily only when needed for coughing  We will refill these sulcrafate  Return at anytime if needed

## 2014-10-15 ENCOUNTER — Other Ambulatory Visit: Payer: Self-pay | Admitting: Urgent Care

## 2014-10-17 ENCOUNTER — Ambulatory Visit (INDEPENDENT_AMBULATORY_CARE_PROVIDER_SITE_OTHER): Payer: BLUE CROSS/BLUE SHIELD | Admitting: Physician Assistant

## 2014-10-17 VITALS — BP 118/88 | HR 77 | Temp 97.9°F | Resp 16 | Ht 67.5 in | Wt 158.0 lb

## 2014-10-17 DIAGNOSIS — Z1239 Encounter for other screening for malignant neoplasm of breast: Secondary | ICD-10-CM

## 2014-10-17 DIAGNOSIS — Z1211 Encounter for screening for malignant neoplasm of colon: Secondary | ICD-10-CM | POA: Diagnosis not present

## 2014-10-17 DIAGNOSIS — F419 Anxiety disorder, unspecified: Secondary | ICD-10-CM

## 2014-10-17 DIAGNOSIS — K219 Gastro-esophageal reflux disease without esophagitis: Secondary | ICD-10-CM

## 2014-10-17 DIAGNOSIS — Z79899 Other long term (current) drug therapy: Secondary | ICD-10-CM | POA: Diagnosis not present

## 2014-10-17 DIAGNOSIS — Z0000 Encounter for general adult medical examination without abnormal findings: Secondary | ICD-10-CM | POA: Diagnosis not present

## 2014-10-17 DIAGNOSIS — E785 Hyperlipidemia, unspecified: Secondary | ICD-10-CM

## 2014-10-17 DIAGNOSIS — I10 Essential (primary) hypertension: Secondary | ICD-10-CM | POA: Diagnosis not present

## 2014-10-17 LAB — COMPREHENSIVE METABOLIC PANEL
ALK PHOS: 79 U/L (ref 39–117)
ALT: 12 U/L (ref 0–35)
AST: 17 U/L (ref 0–37)
Albumin: 4.1 g/dL (ref 3.5–5.2)
BUN: 14 mg/dL (ref 6–23)
CALCIUM: 9.5 mg/dL (ref 8.4–10.5)
CO2: 29 mEq/L (ref 19–32)
Chloride: 105 mEq/L (ref 96–112)
Creat: 0.82 mg/dL (ref 0.50–1.10)
GLUCOSE: 83 mg/dL (ref 70–99)
Potassium: 4.1 mEq/L (ref 3.5–5.3)
Sodium: 142 mEq/L (ref 135–145)
TOTAL PROTEIN: 7.1 g/dL (ref 6.0–8.3)
Total Bilirubin: 0.8 mg/dL (ref 0.2–1.2)

## 2014-10-17 LAB — VITAMIN B12: Vitamin B-12: 558 pg/mL (ref 211–911)

## 2014-10-17 LAB — MAGNESIUM: Magnesium: 1.9 mg/dL (ref 1.5–2.5)

## 2014-10-17 MED ORDER — AMLODIPINE BESYLATE 10 MG PO TABS: 10.0000 mg | ORAL_TABLET | Freq: Every day | ORAL | Status: DC

## 2014-10-17 MED ORDER — OMEPRAZOLE 20 MG PO CPDR: 20.0000 mg | DELAYED_RELEASE_CAPSULE | Freq: Every day | ORAL | Status: DC

## 2014-10-17 NOTE — Patient Instructions (Signed)
I've refilled your amlodipine for 6 months. I've refilled your omeprazole for 6 months. I've sent in 20 mg pills, you previously had 40 mg pills, so hopefully your symptoms will continue to stay away even on the lesser dose. It's important to try to wean off PPIs at some point due to some long term side effects they have. If your symptoms return you can either stay on a low dose medications or see GI to be evaluated.  We're checking some labs today due to you being on these mediations long term.  We'll plan to see you back in August for your complete physical.

## 2014-10-17 NOTE — Progress Notes (Signed)
Subjective:    Patient ID: Christina [name], female    DOB: 02/10/1946, 69 y.o.   MRN: [id number]  Chief Complaint  Patient presents with  . Medication Refill    amlodipine,lorazepam,omeprazole   Patient Active Problem List   Diagnosis Date Noted  . 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 Angioedema 08/04/2014  . GERD (gastroesophageal reflux disease) 08/10/2013  . Other and unspecified hyperlipidemia 06/23/2013  . HTN (hypertension) 07/23/2012  . Lipid disorder 07/23/2012   Prior to Admission medications   Medication Sig Start Date End Date Taking? Authorizing Provider  albuterol (PROVENTIL HFA;VENTOLIN HFA) 108 (90 BASE) MCG/ACT inhaler Use 2 inhalations every 4-6 hours as needed for cough wheezing 08/03/13  Yes Posey Boyer, MD  amLODipine (NORVASC) 10 MG tablet Take 1 tablet (10 mg total) by mouth daily. 10/17/14  Yes Emarie Paul M Deronte Solis, PA  EPINEPHrine (EPIPEN) 0.3 mg/0.3 mL DEVI Inject 0.3 mLs (0.3 mg total) into the muscle once. 11/16/11  Yes Darlyne Russian, MD  FLUoxetine (PROZAC) 20 MG tablet Take 1 tablet (20 mg total) by mouth daily. 08/20/14  Yes Jaynee Eagles, PA-C  ipratropium (ATROVENT) 0.03 % nasal spray Place 2 sprays into both nostrils 2 (two) times daily. 08/20/14  Yes Jaynee Eagles, PA-C  omeprazole (PRILOSEC) 20 MG capsule Take 1 capsule (20 mg total) by mouth daily. 10/17/14  Yes Merlinda Frederick Edgar Reisz, PA  simvastatin (ZOCOR) 20 MG tablet Take 1 tablet (20 mg total) by mouth daily at 6 PM. 02/14/14  Yes Orma Flaming, MD  sucralfate (CARAFATE) 1 G tablet 1 tablet 1 hr ac and hs 09/24/14  Yes Posey Boyer, MD  LORazepam (ATIVAN) 0.5 MG tablet Take 1 tablet (0.5 mg total) by mouth 2 (two) times daily as needed for anxiety. 10/18/14   Jaynee Eagles, PA-C    Medications, allergies, past medical history, surgical history, family history, social history and problem list reviewed and updated.  HPI  73 yof with pmh htn, high cho, gerd, recently diagnosed anxiety presents for med refills.     HTN - Needs amlodipine refill. Takes amlodipine 10 mg qd. Has hx angioedema with ace-i. Checks bp at home weekly. 120s/80s. Denies cp, sob, palps, presyncope, syncope, vision changes, persistent HAs. Exercises with walking 3x week. Watches sodium intake.   Anxiety - Has been on laorazepam for several months. Takes 1 prn every 2-3 days for anxiety. Looking through meds she actually still has a refill waiting to be filled so will not fill today.   GERD - Has been on nexium for many yrs for gerd. Insurance required her to change to omeprazole several months ago. Has been taking 40 mg qd without any sx of reflux. Denies dysphagia, odynophagia. She has never had a GI eval, has never had an upper endo. She had a DEXA 5 yrs ago normal per pt. She is willing to decrease to 20 mg qd to see if this lower dose will keep sx at [place].   She has her physicals in the fall and is planning to come in in August for her cpe.   Review of Systems See HPI.     Objective:   Physical Exam  Constitutional: She appears well-developed and well-nourished.  Non-toxic appearance. She does not have a sickly appearance. She does not appear ill. No distress.  BP 118/88 mmHg  Pulse 77  Temp(Src) 97.9 F (36.6 C) (Oral)  Resp 16  Ht 5' 7.5" (1.715 m)  Wt 158 lb (71.668 kg)  BMI 24.37 kg/m2  SpO2 94%   Cardiovascular: Normal rate, regular rhythm and normal heart sounds.   Pulmonary/Chest: Effort normal and breath sounds normal.      Assessment & Plan:   74 yof with pmh htn, high cho, gerd, recently diagnosed anxiety presents for med refills.   Gastroesophageal reflux disease without esophagitis - Plan: Vitamin B12, Magnesium, omeprazole (PRILOSEC) 20 MG capsule --attempt to decrease 40 to 20 mg omeprazole, monitor sx --checking b12, mag, ca today --noraml bds 5 yrs ago per pt, encouraged to think about another one due to long term ppi therapy  Essential hypertension - Plan: Comprehensive metabolic panel,  amLODipine (NORVASC) 10 MG tablet --well controlled, refilled  Anxiety --has one refill still of lorazepam, instructed to contact office if she needs further refill prior to cpe in August  Encounter for long-term (current) drug use - Plan: Vitamin B12, Magnesium, Comprehensive metabolic panel    Julieta Gutting, PA-C Physician Assistant-Certified [facility]  10/18/2014 6:07 PM

## 2014-10-17 NOTE — Telephone Encounter (Signed)
Discussed case with PA-Todd. Patient already had a refill and was advised to go fill this. PA-Todd also advised that patient return to clinic for follow up on anxiety med management.

## 2014-10-18 ENCOUNTER — Encounter: Payer: Self-pay | Admitting: Physician Assistant

## 2014-10-18 ENCOUNTER — Telehealth: Payer: Self-pay

## 2014-10-18 DIAGNOSIS — F410 Panic disorder [episodic paroxysmal anxiety] without agoraphobia: Secondary | ICD-10-CM

## 2014-10-18 DIAGNOSIS — F419 Anxiety disorder, unspecified: Secondary | ICD-10-CM

## 2014-10-18 MED ORDER — LORAZEPAM 0.5 MG PO TABS: 0.5000 mg | ORAL_TABLET | Freq: Two times a day (BID) | ORAL | Status: DC | PRN

## 2014-10-18 NOTE — Telephone Encounter (Signed)
Please call patient's pharmacy and ask how many times she has filled rx for lorazepam in the past 2 months. Thank you!

## 2014-10-18 NOTE — Telephone Encounter (Signed)
Spoke with pharmacist. Lorazepam has been filled march 5th and march 29th.

## 2014-10-18 NOTE — Telephone Encounter (Signed)
Patient admits that her anxiety is improved. She was taking '40mg'$  of Prozac daily for maintenance therapy and 0.'5mg'$  lorazepam BID as needed for breakthrough anxiety/panic attacks. Patient reports that her Prozac was decreased to '20mg'$  yesterday 10/17/2014 but was unclear why. She also states that she has not used lorazepam daily. Patient does admit that she feels drowsy at times but states that this occurs when she doesn't take lorazepam. I reviewed side effect profile of her medications and lorazepam is the only one known to commonly cause drowsiness. I reviewed risks of using benzo daily including delirium. Patient contracted for safety, acknowledged potential for adverse effects. She agreed to return to clinic in 1 month for anxiety follow up. If patient continues to feel anxiety and is using lorazepam daily, I told her I would refer her to psychiatry and start to cut her back on lorazepam so that she could be better managed by a specialist. Patient agreed.

## 2014-10-18 NOTE — Telephone Encounter (Signed)
Pt came into office 5/2 for refill on LORazepam (ATIVAN) 0.5 MG tablet [[id number]] , rx was not sent to pharmacy. Please advise

## 2014-10-18 NOTE — Telephone Encounter (Signed)
Can we refill? I'm not sure who to send this to.

## 2014-10-24 ENCOUNTER — Other Ambulatory Visit: Payer: Self-pay | Admitting: Family Medicine

## 2015-02-10 ENCOUNTER — Ambulatory Visit (INDEPENDENT_AMBULATORY_CARE_PROVIDER_SITE_OTHER): Payer: BLUE CROSS/BLUE SHIELD | Admitting: Family Medicine

## 2015-02-10 VITALS — BP 132/80 | HR 87 | Temp 98.7°F | Resp 16 | Ht 68.0 in | Wt 163.4 lb

## 2015-02-10 DIAGNOSIS — F410 Panic disorder [episodic paroxysmal anxiety] without agoraphobia: Secondary | ICD-10-CM

## 2015-02-10 DIAGNOSIS — F419 Anxiety disorder, unspecified: Secondary | ICD-10-CM | POA: Diagnosis not present

## 2015-02-10 DIAGNOSIS — J0100 Acute maxillary sinusitis, unspecified: Secondary | ICD-10-CM | POA: Diagnosis not present

## 2015-02-10 DIAGNOSIS — R05 Cough: Secondary | ICD-10-CM

## 2015-02-10 DIAGNOSIS — R059 Cough, unspecified: Secondary | ICD-10-CM

## 2015-02-10 DIAGNOSIS — K219 Gastro-esophageal reflux disease without esophagitis: Secondary | ICD-10-CM

## 2015-02-10 MED ORDER — AMOXICILLIN-POT CLAVULANATE 875-125 MG PO TABS: 1.0000 | ORAL_TABLET | Freq: Two times a day (BID) | ORAL | Status: DC

## 2015-02-10 MED ORDER — SUCRALFATE 1 G PO TABS: ORAL_TABLET | ORAL | Status: DC

## 2015-02-10 MED ORDER — LORAZEPAM 0.5 MG PO TABS: 0.5000 mg | ORAL_TABLET | Freq: Two times a day (BID) | ORAL | Status: DC | PRN

## 2015-02-10 NOTE — Progress Notes (Signed)
Chief Complaint:  Chief Complaint  Patient presents with  . Nasal Congestion    x 3 days     HPI: Christina [name] is a 68 y.o. female who reports to [facility] today complaining of :  1. 3 day hx of nasal congestion, sinus pressure, no fevers or chills, nonsmoker. No ear pain no cough no cp or spb. She has tried sudafed, she denies seasonal alllergies  2. She has GERD and uses Prilosec and when she does not feel better then will take carafate 3. She has anxiety issues takes it once or twice a week prn, does not know what her triggers are  Deneis any SI HI hallucinations  Past Medical History  Diagnosis Date  . Reflux   . Hypertension   . Hyperlipidemia   . GERD (gastroesophageal reflux disease)   . Allergy    Past Surgical History  Procedure Laterality Date  . Cesarean section      1 time   Social History   Social History  . Marital Status: Married    Spouse Name: [name]  . Number of Children: 1  . Years of Education: 12th grade   Occupational History  . school housekeeping     part-time   Social History Main Topics  . Smoking status: Former Smoker -- 0.50 packs/day    Types: Cigarettes    Quit date: 08/03/2013  . Smokeless tobacco: Never Used  . Alcohol Use: No  . Drug Use: No  . Sexual Activity: No   Other Topics Concern  . None   Social History Narrative   Lives with her husband. Her daughter also lives in [place] with her husband and 3 children.   Family History  Problem Relation Age of Onset  . Hypertension Mother   . Leukemia Father    Allergies  Allergen Reactions  . Coconut Oil     HIVES  . Codeine Other (See Comments)    Upsets stomach really bad  . Trandolapril Swelling    Swelling of the face/lips 07/2014   Prior to Admission medications   Medication Sig Start Date End Date Taking? Authorizing Provider  amLODipine (NORVASC) 10 MG tablet Take 1 tablet (10 mg total) by mouth daily. 10/17/14  Yes Todd McVeigh, PA  EPINEPHrine  (EPIPEN) 0.3 mg/0.3 mL DEVI Inject 0.3 mLs (0.3 mg total) into the muscle once. 11/16/11  Yes Darlyne Russian, MD  LORazepam (ATIVAN) 0.5 MG tablet Take 1 tablet (0.5 mg total) by mouth 2 (two) times daily as needed for anxiety. 10/18/14  Yes Jaynee Eagles, PA-C  omeprazole (PRILOSEC) 20 MG capsule Take 1 capsule (20 mg total) by mouth daily. 10/17/14  Yes Todd McVeigh, PA  PROAIR HFA 108 (90 BASE) MCG/ACT inhaler INHALE 2 PUFFS INTO THE LUNGS EVERY 4 TO 6 HOURS AS NEEDED FOR COUGH OR WHEEZE 10/24/14  Yes Posey Boyer, MD  simvastatin (ZOCOR) 20 MG tablet Take 1 tablet (20 mg total) by mouth daily at 6 PM. 02/14/14  Yes Orma Flaming, MD  sucralfate (CARAFATE) 1 G tablet 1 tablet 1 hr ac and hs 09/24/14  Yes Posey Boyer, MD  FLUoxetine (PROZAC) 20 MG tablet Take 1 tablet (20 mg total) by mouth daily. Patient not taking: Reported on 02/10/2015 08/20/14   Jaynee Eagles, PA-C  ipratropium (ATROVENT) 0.03 % nasal spray Place 2 sprays into both nostrils 2 (two) times daily. Patient not taking: Reported on 02/10/2015 08/20/14   Jaynee Eagles, PA-C  ROS: The patient denies fevers, chills, night sweats, unintentional weight loss, chest pain, palpitations, wheezing, dyspnea on exertion, nausea, vomiting, abdominal pain, dysuria, hematuria, melena, numbness, weakness, or tingling.   All other systems have been reviewed and were otherwise negative with the exception of those mentioned in the HPI and as above.    PHYSICAL EXAM: Filed Vitals:   02/10/15 0906  BP: 132/80  Pulse: 87  Temp: 98.7 F (37.1 C)  Resp: 16   Body mass index is 24.85 kg/(m^2).   General: Alert, no acute distress HEENT:  Normocephalic, atraumatic, oropharynx patent. EOMI, PERRLA Erythematous throat, no exudates, TM normal, + sinus tenderness, + erythematous/boggy nasal mucosa Cardiovascular:  Regular rate and rhythm, no rubs murmurs or gallops.  No Carotid bruits, radial pulse intact. No pedal edema.  Respiratory: Clear to auscultation  bilaterally.  No wheezes, rales, or rhonchi.  No cyanosis, no use of accessory musculature Abdominal: No organomegaly, abdomen is soft and non-tender, positive bowel sounds. No masses. Skin: No rashes. Neurologic: Facial musculature symmetric. Psychiatric: Patient acts appropriately throughout our interaction. Lymphatic: No cervical or submandibular lymphadenopathy Musculoskeletal: Gait intact. No edema, tenderness   LABS: Results for orders placed or performed in visit on 10/17/14  Vitamin B12  Result Value Ref Range   Vitamin B-12 558 211 - 911 pg/mL  Magnesium  Result Value Ref Range   Magnesium 1.9 1.5 - 2.5 mg/dL  Comprehensive metabolic panel  Result Value Ref Range   Sodium 142 135 - 145 mEq/L   Potassium 4.1 3.5 - 5.3 mEq/L   Chloride 105 96 - 112 mEq/L   CO2 29 19 - 32 mEq/L   Glucose, Bld 83 70 - 99 mg/dL   BUN 14 6 - 23 mg/dL   Creat 0.82 0.50 - 1.10 mg/dL   Total Bilirubin 0.8 0.2 - 1.2 mg/dL   Alkaline Phosphatase 79 39 - 117 U/L   AST 17 0 - 37 U/L   ALT 12 0 - 35 U/L   Total Protein 7.1 6.0 - 8.3 g/dL   Albumin 4.1 3.5 - 5.2 g/dL   Calcium 9.5 8.4 - 10.5 mg/dL     EKG/XRAY:   Primary read interpreted by Dr. Marin Comment at [facility].   ASSESSMENT/PLAN: Encounter Diagnoses  Name Primary?  . Acute maxillary sinusitis, recurrence not specified Yes  . Cough   . Anxiety   . Gastroesophageal reflux disease without esophagitis   . Anxiety attack    Refill lorazepam Refill Carafate Rx Augmentin, she is going on vacation Try nasacort first before taking augmentin Fu prn  [place] controlled Substance DB pulled, no illegal activities  Gross sideeffects, risk and benefits, and alternatives of medications d/w patient. Patient is aware that all medications have potential sideeffects and we are unable to predict every sideeffect or drug-drug interaction that may occur.  Thao Le DO  02/10/2015 9:38 AM

## 2015-02-10 NOTE — Patient Instructions (Signed)

## 2015-03-15 ENCOUNTER — Other Ambulatory Visit: Payer: Self-pay | Admitting: Physician Assistant

## 2015-04-19 ENCOUNTER — Other Ambulatory Visit: Payer: Self-pay

## 2015-04-19 DIAGNOSIS — Z1231 Encounter for screening mammogram for malignant neoplasm of breast: Secondary | ICD-10-CM

## 2015-05-10 ENCOUNTER — Ambulatory Visit
Admission: RE | Admit: 2015-05-10 | Discharge: 2015-05-10 | Disposition: A | Discharge status: Home / Self Care | Payer: BLUE CROSS/BLUE SHIELD | Source: Ambulatory Visit

## 2015-05-10 DIAGNOSIS — Z1231 Encounter for screening mammogram for malignant neoplasm of breast: Secondary | ICD-10-CM

## 2015-05-30 ENCOUNTER — Ambulatory Visit (INDEPENDENT_AMBULATORY_CARE_PROVIDER_SITE_OTHER): Payer: BLUE CROSS/BLUE SHIELD | Admitting: Physician Assistant

## 2015-05-30 VITALS — BP 140/86 | HR 72 | Temp 98.2°F | Resp 16 | Ht 68.0 in | Wt 167.2 lb

## 2015-05-30 DIAGNOSIS — J069 Acute upper respiratory infection, unspecified: Secondary | ICD-10-CM | POA: Diagnosis not present

## 2015-05-30 DIAGNOSIS — R062 Wheezing: Secondary | ICD-10-CM

## 2015-05-30 MED ORDER — IPRATROPIUM BROMIDE 0.03 % NA SOLN: 2.0000 | Freq: Two times a day (BID) | NASAL | Status: DC

## 2015-05-30 MED ORDER — CETIRIZINE HCL 10 MG PO TABS: 10.0000 mg | ORAL_TABLET | Freq: Every day | ORAL | Status: DC

## 2015-05-30 MED ORDER — GUAIFENESIN ER 1200 MG PO TB12: 1.0000 | ORAL_TABLET | Freq: Two times a day (BID) | ORAL | Status: DC | PRN

## 2015-05-30 MED ORDER — ALBUTEROL SULFATE HFA 108 (90 BASE) MCG/ACT IN AERS: 2.0000 | INHALATION_SPRAY | Freq: Four times a day (QID) | RESPIRATORY_TRACT | Status: DC | PRN

## 2015-05-30 NOTE — Progress Notes (Signed)
[facility] [address] [place] [address] [phone]  Date:  05/30/2015   Name:  Christina [name]   DOB:  Nov 25, 1945   MRN:  [id number]  PCP:  Kennon Portela, MD   Chief Complaint  Patient presents with  . Sinusitis    x 1 week, nasal congestion  . Sore Throat    x 1 week  . Cough    x 1 week  . Medication Refill    pro air inhaler and lorazepam    History of Present Illness:  Christina [name] is a 69 y.o. female patient who presents to [facility] for 1 week of cold like symptoms.  The last couple days, she has witnessed more congestion.  She has no fever, facial pain, rhinorrhea, or sore throat.   Congestion is worse in the morning.  Cough is non-productive.  She is sleeping well.  No known allergies.  She is a non-smoker.  She has done nothing for relief.      -request refill of albuterol.  She states that she has no shortness of breath.  Generally uses for expiratory wheezing.  This is not apparent at this time, according to patient.  Patient Active Problem List   Diagnosis Date Noted  . 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 Angioedema 08/04/2014  . GERD (gastroesophageal reflux disease) 08/10/2013  . Other and unspecified hyperlipidemia 06/23/2013  . HTN (hypertension) 07/23/2012  . Lipid disorder 07/23/2012    Past Medical History  Diagnosis Date  . Reflux   . Hypertension   . Hyperlipidemia   . GERD (gastroesophageal reflux disease)   . Allergy     Past Surgical History  Procedure Laterality Date  . Cesarean section      1 time    Social History  Substance Use Topics  . Smoking status: Former Smoker -- 0.50 packs/day    Types: Cigarettes    Quit date: 08/03/2013  . Smokeless tobacco: Never Used  . Alcohol Use: No    Family History  Problem Relation Age of Onset  . Hypertension Mother   . Leukemia Father     Allergies  Allergen Reactions  . Coconut Oil     HIVES  . Codeine Other (See Comments)   Upsets stomach really bad  . Trandolapril Swelling    Swelling of the face/lips 07/2014    Medication list has been reviewed and updated.  Current Outpatient Prescriptions on File Prior to Visit  Medication Sig Dispense Refill  . amLODipine (NORVASC) 10 MG tablet TAKE 1 TABLET(10 MG) BY MOUTH DAILY 90 tablet 0  . EPINEPHrine (EPIPEN) 0.3 mg/0.3 mL DEVI Inject 0.3 mLs (0.3 mg total) into the muscle once. 1 Device 5  . omeprazole (PRILOSEC) 20 MG capsule TAKE 1 CAPSULE(20 MG) BY MOUTH DAILY 90 capsule 1  . PROAIR HFA 108 (90 BASE) MCG/ACT inhaler INHALE 2 PUFFS INTO THE LUNGS EVERY 4 TO 6 HOURS AS NEEDED FOR COUGH OR WHEEZE 8.5 g 0  . simvastatin (ZOCOR) 20 MG tablet Take 1 tablet (20 mg total) by mouth daily at 6 PM. 90 tablet 3  . sucralfate (CARAFATE) 1 G tablet 1 tablet 1 hr ac and hs as needed 120 tablet 3  . amoxicillin-clavulanate (AUGMENTIN) 875-125 MG per tablet Take 1 tablet by mouth 2 (two) times daily. (Patient not taking: Reported on 05/30/2015) 20 tablet 0  . LORazepam (ATIVAN) 0.5 MG tablet Take 1 tablet (0.5 mg total) by mouth 2 (two)  times daily as needed for anxiety. (Patient not taking: Reported on 05/30/2015) 60 tablet 0   No current facility-administered medications on file prior to visit.    ROS ROS otherwise unremarkable unless listed above.   Physical Examination: BP 140/86 mmHg  Pulse 72  Temp(Src) 98.2 F (36.8 C) (Oral)  Resp 16  Ht '5\' 8"'$  (1.727 m)  Wt 167 lb 3.2 oz (75.841 kg)  BMI 25.43 kg/m2  SpO2 98% Ideal Body Weight: Weight in (lb) to have BMI = 25: 164.1  Physical Exam  Constitutional: She is oriented to person, place, and time. She appears well-developed and well-nourished. No distress.  HENT:  Head: Normocephalic and atraumatic.  Right Ear: Tympanic membrane, external ear and ear canal normal.  Left Ear: Tympanic membrane, external ear and ear canal normal.  Nose: Mucosal edema and rhinorrhea present. Right sinus exhibits no maxillary sinus  tenderness and no frontal sinus tenderness. Left sinus exhibits no maxillary sinus tenderness and no frontal sinus tenderness.  Mouth/Throat: No uvula swelling. No oropharyngeal exudate, posterior oropharyngeal edema or posterior oropharyngeal erythema.  Eyes: Conjunctivae and EOM are normal. Pupils are equal, round, and reactive to light.  Cardiovascular: Normal rate and regular rhythm.  Exam reveals no gallop, no distant heart sounds and no friction rub.   No murmur heard. Pulmonary/Chest: Effort normal. No respiratory distress. She has no decreased breath sounds. She has no wheezes. She has no rhonchi.  Lymphadenopathy:       Head (right side): No submandibular, no tonsillar, no preauricular and no posterior auricular adenopathy present.       Head (left side): No submandibular, no tonsillar, no preauricular and no posterior auricular adenopathy present.  Neurological: She is alert and oriented to person, place, and time.  Skin: She is not diaphoretic.  Psychiatric: She has a normal mood and affect. Her behavior is normal.     Assessment and Plan: Christina [name] is a 69 y.o. female who is here today for respiratory infection.   -treating supportively at this time. below   -refilling albuterol -If she continues to have symptoms, we will treat with azithromycin z-pak  Viral URI - Plan: Guaifenesin (MUCINEX MAXIMUM STRENGTH) 1200 MG TB12, ipratropium (ATROVENT) 0.03 % nasal spray, cetirizine (ZYRTEC) 10 MG tablet  Expiratory wheezing - Plan: albuterol (PROAIR HFA) 108 (90 BASE) MCG/ACT inhaler  Ivar Drape, PA-C [facility] 05/30/2015 8:50 AM

## 2015-05-30 NOTE — Patient Instructions (Addendum)
Upper Respiratory Infection, Adult Most upper respiratory infections (URIs) are a viral infection of the air passages leading to the lungs. A URI affects the nose, throat, and upper air passages. The most common type of URI is nasopharyngitis and is typically referred to as "the common cold." URIs run their course and usually go away on their own. Most of the time, a URI does not require medical attention, but sometimes a bacterial infection in the upper airways can follow a viral infection. This is called a secondary infection. Sinus and middle ear infections are common types of secondary upper respiratory infections. Bacterial pneumonia can also complicate a URI. A URI can worsen asthma and chronic obstructive pulmonary disease (COPD). Sometimes, these complications can require emergency medical care and may be life threatening.  CAUSES Almost all URIs are caused by viruses. A virus is a type of germ and can spread from one person to another.  RISKS FACTORS You may be at risk for a URI if:   You smoke.   You have chronic heart or lung disease.  You have a weakened defense (immune) system.   You are very young or very old.   You have nasal allergies or asthma.  You work in crowded or poorly ventilated areas.  You work in health care facilities or schools. SIGNS AND SYMPTOMS  Symptoms typically develop 2-3 days after you come in contact with a cold virus. Most viral URIs last 7-10 days. However, viral URIs from the influenza virus (flu virus) can last 14-18 days and are typically more severe. Symptoms may include:   Runny or stuffy (congested) nose.   Sneezing.   Cough.   Sore throat.   Headache.   Fatigue.   Fever.   Loss of appetite.   Pain in your forehead, behind your eyes, and over your cheekbones (sinus pain).  Muscle aches.  DIAGNOSIS  Your health care provider may diagnose a URI by:  Physical exam.  Tests to check that your symptoms are not due to  another condition such as:  Strep throat.  Sinusitis.  Pneumonia.  Asthma. TREATMENT  A URI goes away on its own with time. It cannot be cured with medicines, but medicines may be prescribed or recommended to relieve symptoms. Medicines may help:  Reduce your fever.  Reduce your cough.  Relieve nasal congestion. HOME CARE INSTRUCTIONS   Take medicines only as directed by your health care provider.   Gargle warm saltwater or take cough drops to comfort your throat as directed by your health care provider.  Use a warm mist humidifier or inhale steam from a shower to increase air moisture. This may make it easier to breathe.  Drink enough fluid to keep your urine clear or pale yellow.   Eat soups and other clear broths and maintain good nutrition.   Rest as needed.   Return to work when your temperature has returned to normal or as your health care provider advises. You may need to stay home longer to avoid infecting others. You can also use a face mask and careful hand washing to prevent spread of the virus.  Increase the usage of your inhaler if you have asthma.   Do not use any tobacco products, including cigarettes, chewing tobacco, or electronic cigarettes. If you need help quitting, ask your health care provider. PREVENTION  The best way to protect yourself from getting a cold is to practice good hygiene.   Avoid oral or hand contact with people with cold   symptoms.   Wash your hands often if contact occurs.  There is no clear evidence that vitamin C, vitamin E, echinacea, or exercise reduces the chance of developing a cold. However, it is always recommended to get plenty of rest, exercise, and practice good nutrition.  SEEK MEDICAL CARE IF:   You are getting worse rather than better.   Your symptoms are not controlled by medicine.   You have chills.  You have worsening shortness of breath.  You have brown or red mucus.  You have yellow or brown nasal  discharge.  You have pain in your face, especially when you bend forward.  You have a fever.  You have swollen neck glands.  You have pain while swallowing.  You have white areas in the back of your throat. SEEK IMMEDIATE MEDICAL CARE IF:   You have severe or persistent:  Headache.  Ear pain.  Sinus pain.  Chest pain.  You have chronic lung disease and any of the following:  Wheezing.  Prolonged cough.  Coughing up blood.  A change in your usual mucus.  You have a stiff neck.  You have changes in your:  Vision.  Hearing.  Thinking.  Mood. MAKE SURE YOU:   Understand these instructions.  Will watch your condition.  Will get help right away if you are not doing well or get worse.   This information is not intended to replace advice given to you by your health care provider. Make sure you discuss any questions you have with your health care provider.   Document Released: 11/27/2000 Document Revised: 10/18/2014 Document Reviewed: 09/08/2013 Elsevier Interactive Patient Education 2016 [employer]  

## 2015-06-01 ENCOUNTER — Telehealth: Payer: Self-pay

## 2015-06-01 NOTE — Telephone Encounter (Signed)
Pt was seen here on 12/16 by S. English for Viral URI - Primary. She isn't feeling any better. She would like to know what she could do or be prescribed. CB # [phone]

## 2015-06-02 MED ORDER — AZITHROMYCIN 250 MG PO TABS: ORAL_TABLET | ORAL | Status: DC

## 2015-06-02 NOTE — Telephone Encounter (Signed)
Spoke with pt, advised message.

## 2015-06-02 NOTE — Telephone Encounter (Signed)
End of note below...ok to give zpak at this time.  i will order this.  2 today, and 1 pill for 4 days.  Please alert patient that it is at her pharmacy.  -treating supportively at this time. below  -refilling albuterol -If she continues to have symptoms, we will treat with azithromycin z-pak  Viral URI - Plan: Guaifenesin (MUCINEX MAXIMUM STRENGTH) 1200 MG TB12, ipratropium (ATROVENT) 0.03 % nasal spray, cetirizine (ZYRTEC) 10 MG tablet  Expiratory wheezing - Plan: albuterol (PROAIR HFA) 108 (90 BASE) MCG/ACT inhaler  Ivar Drape, PA-C [facility] 05/30/2015 8:50 AM

## 2015-06-14 ENCOUNTER — Other Ambulatory Visit: Payer: Self-pay | Admitting: Family Medicine

## 2015-06-14 ENCOUNTER — Other Ambulatory Visit: Payer: Self-pay | Admitting: Internal Medicine

## 2015-06-17 ENCOUNTER — Other Ambulatory Visit: Payer: Self-pay | Admitting: Internal Medicine

## 2015-07-05 ENCOUNTER — Other Ambulatory Visit: Payer: Self-pay | Admitting: Family Medicine

## 2015-08-10 ENCOUNTER — Other Ambulatory Visit: Payer: Self-pay | Admitting: Family Medicine

## 2015-08-14 ENCOUNTER — Ambulatory Visit (INDEPENDENT_AMBULATORY_CARE_PROVIDER_SITE_OTHER): Payer: BLUE CROSS/BLUE SHIELD | Admitting: Family Medicine

## 2015-08-14 VITALS — BP 136/84 | HR 66 | Temp 98.5°F | Resp 16 | Ht 68.0 in | Wt 169.6 lb

## 2015-08-14 DIAGNOSIS — K219 Gastro-esophageal reflux disease without esophagitis: Secondary | ICD-10-CM | POA: Diagnosis not present

## 2015-08-14 DIAGNOSIS — Z23 Encounter for immunization: Secondary | ICD-10-CM | POA: Diagnosis not present

## 2015-08-14 DIAGNOSIS — I10 Essential (primary) hypertension: Secondary | ICD-10-CM

## 2015-08-14 DIAGNOSIS — Z780 Asymptomatic menopausal state: Secondary | ICD-10-CM

## 2015-08-14 DIAGNOSIS — E785 Hyperlipidemia, unspecified: Secondary | ICD-10-CM

## 2015-08-14 DIAGNOSIS — R062 Wheezing: Secondary | ICD-10-CM | POA: Diagnosis not present

## 2015-08-14 DIAGNOSIS — Z0000 Encounter for general adult medical examination without abnormal findings: Secondary | ICD-10-CM

## 2015-08-14 LAB — COMPREHENSIVE METABOLIC PANEL
ALT: 12 U/L (ref 6–29)
AST: 16 U/L (ref 10–35)
Albumin: 4 g/dL (ref 3.6–5.1)
Alkaline Phosphatase: 70 U/L (ref 33–130)
BUN: 14 mg/dL (ref 7–25)
CO2: 30 mmol/L (ref 20–31)
Calcium: 9.4 mg/dL (ref 8.6–10.4)
Chloride: 104 mmol/L (ref 98–110)
Creat: 0.77 mg/dL (ref 0.50–0.99)
GLUCOSE: 84 mg/dL (ref 65–99)
POTASSIUM: 4.2 mmol/L (ref 3.5–5.3)
Sodium: 142 mmol/L (ref 135–146)
Total Bilirubin: 0.6 mg/dL (ref 0.2–1.2)
Total Protein: 7 g/dL (ref 6.1–8.1)

## 2015-08-14 LAB — LIPID PANEL
Cholesterol: 215 mg/dL — ABNORMAL HIGH (ref 125–200)
HDL: 65 mg/dL (ref >=46)
LDL CALC: 133 mg/dL — AB (ref <130)
Total CHOL/HDL Ratio: 3.3 Ratio (ref <=5.0)
Triglycerides: 83 mg/dL (ref <150)
VLDL: 17 mg/dL (ref <30)

## 2015-08-14 LAB — HEPATITIS C ANTIBODY: HCV AB: NEGATIVE

## 2015-08-14 MED ORDER — ATORVASTATIN CALCIUM 40 MG PO TABS: 40.0000 mg | ORAL_TABLET | Freq: Every day | ORAL | Status: DC

## 2015-08-14 MED ORDER — ZOSTER VACCINE LIVE 19400 UNT/0.65ML ~~LOC~~ SOLR
0.6500 mL | Freq: Once | SUBCUTANEOUS | Status: DC
Start: 2015-08-14 — End: 2015-09-27

## 2015-08-14 MED ORDER — AMLODIPINE BESYLATE 10 MG PO TABS: ORAL_TABLET | ORAL | Status: DC

## 2015-08-14 MED ORDER — SUCRALFATE 1 G PO TABS: ORAL_TABLET | ORAL | Status: DC

## 2015-08-14 MED ORDER — OMEPRAZOLE 20 MG PO CPDR: 20.0000 mg | DELAYED_RELEASE_CAPSULE | Freq: Every day | ORAL | Status: DC

## 2015-08-14 NOTE — Patient Instructions (Signed)
Menopause is a normal process in which your reproductive ability comes to an end. This process happens gradually over a span of months to years, usually between the ages of 48 and 55. Menopause is complete when you have missed 12 consecutive menstrual periods. It is important to talk with your health care provider about some of the most common conditions that affect postmenopausal women, such as heart disease, cancer, and bone loss (osteoporosis). Adopting a healthy lifestyle and getting preventive care can help to promote your health and wellness. Those actions can also lower your chances of developing some of these common conditions. WHAT SHOULD I KNOW ABOUT MENOPAUSE? During menopause, you may experience a number of symptoms, such as:  Moderate-to-severe hot flashes.  Night sweats.  Decrease in sex drive.  Mood swings.  Headaches.  Tiredness.  Irritability.  Memory problems.  Insomnia. Choosing to treat or not to treat menopausal changes is an individual decision that you make with your health care provider. WHAT SHOULD I KNOW ABOUT HORMONE REPLACEMENT THERAPY AND SUPPLEMENTS? Hormone therapy products are effective for treating symptoms that are associated with menopause, such as hot flashes and night sweats. Hormone replacement carries certain risks, especially as you become older. If you are thinking about using estrogen or estrogen with progestin treatments, discuss the benefits and risks with your health care provider. WHAT SHOULD I KNOW ABOUT HEART DISEASE AND STROKE? Heart disease, heart attack, and stroke become more likely as you age. This may be due, in part, to the hormonal changes that your body experiences during menopause. These can affect how your body processes dietary fats, triglycerides, and cholesterol. Heart attack and stroke are both medical emergencies. There are many things that you can do to help prevent heart disease and stroke:  Have your blood pressure  checked at least every 1-2 years. High blood pressure causes heart disease and increases the risk of stroke.  If you are 55-79 years old, ask your health care provider if you should take aspirin to prevent a heart attack or a stroke.  Do not use any tobacco products, including cigarettes, chewing tobacco, or electronic cigarettes. If you need help quitting, ask your health care provider.  It is important to eat a healthy diet and maintain a healthy weight.  Be sure to include plenty of vegetables, fruits, low-fat dairy products, and lean protein.  Avoid eating foods that are high in solid fats, added sugars, or salt (sodium).  Get regular exercise. This is one of the most important things that you can do for your health.  Try to exercise for at least 150 minutes each week. The type of exercise that you do should increase your heart rate and make you sweat. This is known as moderate-intensity exercise.  Try to do strengthening exercises at least twice each week. Do these in addition to the moderate-intensity exercise.  Know your numbers.Ask your health care provider to check your cholesterol and your blood glucose. Continue to have your blood tested as directed by your health care provider. WHAT SHOULD I KNOW ABOUT CANCER SCREENING? There are several types of cancer. Take the following steps to reduce your risk and to catch any cancer development as early as possible. Breast Cancer  Practice breast self-awareness.  This means understanding how your breasts normally appear and feel.  It also means doing regular breast self-exams. Let your health care provider know about any changes, no matter how small.  If you are 40 or older, have a clinician do a   breast exam (clinical breast exam or CBE) every year. Depending on your age, family history, and medical history, it may be recommended that you also have a yearly breast X-ray (mammogram).  If you have a family history of breast cancer,  talk with your health care provider about genetic screening.  If you are at high risk for breast cancer, talk with your health care provider about having an MRI and a mammogram every year.  Breast cancer (BRCA) gene test is recommended for women who have family members with BRCA-related cancers. Results of the assessment will determine the need for genetic counseling and BRCA1 and for BRCA2 testing. BRCA-related cancers include these types:  Breast. This occurs in males or females.  Ovarian.  Tubal. This may also be called fallopian tube cancer.  Cancer of the abdominal or pelvic lining (peritoneal cancer).  Prostate.  Pancreatic. Cervical, Uterine, and Ovarian Cancer Your health care provider may recommend that you be screened regularly for cancer of the pelvic organs. These include your ovaries, uterus, and vagina. This screening involves a pelvic exam, which includes checking for microscopic changes to the surface of your cervix (Pap test).  For women ages 21-65, health care providers may recommend a pelvic exam and a Pap test every three years. For women ages 77-65, they may recommend the Pap test and pelvic exam, combined with testing for human papilloma virus (HPV), every five years. Some types of HPV increase your risk of cervical cancer. Testing for HPV may also be done on women of any age who have unclear Pap test results.  Other health care providers may not recommend any screening for nonpregnant women who are considered low risk for pelvic cancer and have no symptoms. Ask your health care provider if a screening pelvic exam is right for you.  If you have had past treatment for cervical cancer or a condition that could lead to cancer, you need Pap tests and screening for cancer for at least 20 years after your treatment. If Pap tests have been discontinued for you, your risk factors (such as having a new sexual partner) need to be reassessed to determine if you should start having  screenings again. Some women have medical problems that increase the chance of getting cervical cancer. In these cases, your health care provider may recommend that you have screening and Pap tests more often.  If you have a family history of uterine cancer or ovarian cancer, talk with your health care provider about genetic screening.  If you have vaginal bleeding after reaching menopause, tell your health care provider.  There are currently no reliable tests available to screen for ovarian cancer. Lung Cancer Lung cancer screening is recommended for adults 3-70 years old who are at high risk for lung cancer because of a history of smoking. A yearly low-dose CT scan of the lungs is recommended if you:  Currently smoke.  Have a history of at least 30 pack-years of smoking and you currently smoke or have quit within the past 15 years. A pack-year is smoking an average of one pack of cigarettes per day for one year. Yearly screening should:  Continue until it has been 15 years since you quit.  Stop if you develop a health problem that would prevent you from having lung cancer treatment. Colorectal Cancer  This type of cancer can be detected and can often be prevented.  Routine colorectal cancer screening usually begins at age 38 and continues through age 12.  If you have  risk factors for colon cancer, your health care provider may recommend that you be screened at an earlier age.  If you have a family history of colorectal cancer, talk with your health care provider about genetic screening.  Your health care provider may also recommend using home test kits to check for hidden blood in your stool.  A small camera at the end of a tube can be used to examine your colon directly (sigmoidoscopy or colonoscopy). This is done to check for the earliest forms of colorectal cancer.  Direct examination of the colon should be repeated every 5-10 years until age 67. However, if early forms of  precancerous polyps or small growths are found or if you have a family history or genetic risk for colorectal cancer, you may need to be screened more often. Skin Cancer  Check your skin from head to toe regularly.  Monitor any moles. Be sure to tell your health care provider:  About any new moles or changes in moles, especially if there is a change in a mole's shape or color.  If you have a mole that is larger than the size of a pencil eraser.  If any of your family members has a history of skin cancer, especially at a young age, talk with your health care provider about genetic screening.  Always use sunscreen. Apply sunscreen liberally and repeatedly throughout the day.  Whenever you are outside, protect yourself by wearing long sleeves, pants, a wide-brimmed hat, and sunglasses. WHAT SHOULD I KNOW ABOUT OSTEOPOROSIS? Osteoporosis is a condition in which bone destruction happens more quickly than new bone creation. After menopause, you may be at an increased risk for osteoporosis. To help prevent osteoporosis or the bone fractures that can happen because of osteoporosis, the following is recommended:  If you are 39-61 years old, get at least 1,000 mg of calcium and at least 600 mg of vitamin D per day.  If you are older than age 16 but younger than age 7, get at least 1,200 mg of calcium and at least 600 mg of vitamin D per day.  If you are older than age 47, get at least 1,200 mg of calcium and at least 800 mg of vitamin D per day. Smoking and excessive alcohol intake increase the risk of osteoporosis. Eat foods that are rich in calcium and vitamin D, and do weight-bearing exercises several times each week as directed by your health care provider. WHAT SHOULD I KNOW ABOUT HOW MENOPAUSE AFFECTS [place]? Depression may occur at any age, but it is more common as you become older. Common symptoms of depression include:  Low or sad mood.  Changes in sleep patterns.  Changes  in appetite or eating patterns.  Feeling an overall lack of motivation or enjoyment of activities that you previously enjoyed.  Frequent crying spells. Talk with your health care provider if you think that you are experiencing depression. WHAT SHOULD I KNOW ABOUT IMMUNIZATIONS? It is important that you get and maintain your immunizations. These include:  Tetanus, diphtheria, and pertussis (Tdap) booster vaccine.  Influenza every year before the flu season begins.  Pneumonia vaccine.  Shingles vaccine. Your health care provider may also recommend other immunizations.   This information is not intended to replace advice given to you by your health care provider. Make sure you discuss any questions you have with your health care provider.   Document Released: 07/26/2005 Document Revised: 06/24/2014 Document Reviewed: 02/03/2014 Elsevier Interactive Patient Education 2016 Else[employer]  Inc[employer] DASH Eating Plan DASH stands for "Dietary Approaches to Stop Hypertension." The DASH eating plan is a healthy eating plan that has been shown to reduce high blood pressure (hypertension). Additional health benefits may include reducing the risk of type 2 diabetes mellitus, heart disease, and stroke. The DASH eating plan may also help with weight loss. WHAT DO I NEED TO KNOW ABOUT THE DASH EATING PLAN? For the DASH eating plan, you will follow these general guidelines:  Choose foods with a percent daily value for sodium of less than 5% (as listed on the food label).  Use salt-free seasonings or herbs instead of table salt or sea salt.  Check with your health care provider or pharmacist before using salt substitutes.  Eat lower-sodium products, often labeled as "lower sodium" or "no salt added."  Eat fresh foods.  Eat more vegetables, fruits, and low-fat dairy products.  Choose whole grains. Look for the word "whole" as the first word in the ingredient list.  Choose fish and skinless chicken or  [place] more often than red meat. Limit fish, poultry, and meat to 6 oz (170 g) each day.  Limit sweets, desserts, sugars, and sugary drinks.  Choose heart-healthy fats.  Limit cheese to 1 oz (28 g) per day.  Eat more home-cooked food and less restaurant, buffet, and fast food.  Limit fried foods.  Cook foods using methods other than frying.  Limit canned vegetables. If you do use them, rinse them well to decrease the sodium.  When eating at a restaurant, ask that your food be prepared with less salt, or no salt if possible. WHAT FOODS CAN I EAT? Seek help from a dietitian for individual calorie needs. Grains Whole grain or whole wheat bread. Brown rice. Whole grain or whole wheat pasta. Quinoa, bulgur, and whole grain cereals. Low-sodium cereals. Corn or whole wheat flour tortillas. Whole grain cornbread. Whole grain crackers. Low-sodium crackers. Vegetables Fresh or frozen vegetables (raw, steamed, roasted, or grilled). Low-sodium or reduced-sodium tomato and vegetable juices. Low-sodium or reduced-sodium tomato sauce and paste. Low-sodium or reduced-sodium canned vegetables.  Fruits All fresh, canned (in natural juice), or frozen fruits. Meat and Other Protein Products Ground beef (85% or leaner), grass-fed beef, or beef trimmed of fat. Skinless chicken or [place]. Ground chicken or [place]. Pork trimmed of fat. All fish and seafood. Eggs. Dried beans, peas, or lentils. Unsalted nuts and seeds. Unsalted canned beans. Dairy Low-fat dairy products, such as skim or 1% milk, 2% or reduced-fat cheeses, low-fat ricotta or cottage cheese, or plain low-fat yogurt. Low-sodium or reduced-sodium cheeses. Fats and Oils Tub margarines without trans fats. Light or reduced-fat mayonnaise and salad dressings (reduced sodium). Avocado. Safflower, olive, or canola oils. Natural peanut or almond butter. Other Unsalted popcorn and pretzels. The items listed above may not be a complete list of  recommended foods or beverages. Contact your dietitian for more options. WHAT FOODS ARE NOT RECOMMENDED? Grains White bread. White pasta. White rice. Refined cornbread. Bagels and croissants. Crackers that contain trans fat. Vegetables Creamed or fried vegetables. Vegetables in a cheese sauce. Regular canned vegetables. Regular canned tomato sauce and paste. Regular tomato and vegetable juices. Fruits Dried fruits. Canned fruit in light or heavy syrup. Fruit juice. Meat and Other Protein Products Fatty cuts of meat. Ribs, chicken wings, bacon, sausage, bologna, salami, chitterlings, fatback, hot dogs, bratwurst, and packaged luncheon meats. Salted nuts and seeds. Canned beans with salt. Dairy Whole or 2% milk, cream, half-and-half, and cream cheese. Whole-fat or sweetened  yogurt. Full-fat cheeses or blue cheese. Nondairy creamers and whipped toppings. Processed cheese, cheese spreads, or cheese curds. Condiments Onion and garlic salt, seasoned salt, table salt, and sea salt. Canned and packaged gravies. Worcestershire sauce. Tartar sauce. Barbecue sauce. Teriyaki sauce. Soy sauce, including reduced sodium. Steak sauce. Fish sauce. Oyster sauce. Cocktail sauce. Horseradish. Ketchup and mustard. Meat flavorings and tenderizers. Bouillon cubes. Hot sauce. Tabasco sauce. Marinades. Taco seasonings. Relishes. Fats and Oils Butter, stick margarine, lard, shortening, ghee, and bacon fat. Coconut, palm kernel, or palm oils. Regular salad dressings. Other Pickles and olives. Salted popcorn and pretzels. The items listed above may not be a complete list of foods and beverages to avoid. Contact your dietitian for more information. WHERE CAN I FIND MORE INFORMATION? National Heart, Lung, and Blood Institute: travelstabloid.com   This information is not intended to replace advice given to you by your health care provider. Make sure you discuss any questions you have with  your health care provider.   Document Released: 05/23/2011 Document Revised: 06/24/2014 Document Reviewed: 04/07/2013 Elsevier Interactive Patient Education 2016 [employer] for Gastroesophageal Reflux Disease, Adult When you have gastroesophageal reflux disease (GERD), the foods you eat and your eating habits are very important. Choosing the right foods can help ease the discomfort of GERD. WHAT GENERAL GUIDELINES DO I NEED TO FOLLOW?  Choose fruits, vegetables, whole grains, low-fat dairy products, and low-fat meat, fish, and poultry.  Limit fats such as oils, salad dressings, butter, nuts, and avocado.  Keep a food diary to identify foods that cause symptoms.  Avoid foods that cause reflux. These may be different for different people.  Eat frequent small meals instead of three large meals each day.  Eat your meals slowly, in a relaxed setting.  Limit fried foods.  Cook foods using methods other than frying.  Avoid drinking alcohol.  Avoid drinking large amounts of liquids with your meals.  Avoid bending over or lying down until 2-3 hours after eating. WHAT FOODS ARE NOT RECOMMENDED? The following are some foods and drinks that may worsen your symptoms: Vegetables Tomatoes. Tomato juice. Tomato and spaghetti sauce. Chili peppers. Onion and garlic. Horseradish. Fruits Oranges, grapefruit, and lemon (fruit and juice). Meats High-fat meats, fish, and poultry. This includes hot dogs, ribs, ham, sausage, salami, and bacon. Dairy Whole milk and chocolate milk. Sour cream. Cream. Butter. Ice cream. Cream cheese.  Beverages Coffee and tea, with or without caffeine. Carbonated beverages or energy drinks. Condiments Hot sauce. Barbecue sauce.  Sweets/Desserts Chocolate and cocoa. Donuts. Peppermint and spearmint. Fats and Oils High-fat foods, including [place] fries and potato chips. Other Vinegar. Strong spices, such as black pepper, white pepper, red pepper,  cayenne, curry powder, cloves, ginger, and chili powder. The items listed above may not be a complete list of foods and beverages to avoid. Contact your dietitian for more information.   This information is not intended to replace advice given to you by your health care provider. Make sure you discuss any questions you have with your health care provider.   Document Released: 06/03/2005 Document Revised: 06/24/2014 Document Reviewed: 04/07/2013 Elsevier Interactive Patient Education 2016 [employer]. Hypertension Hypertension, commonly called high blood pressure, is when the force of blood pumping through your arteries is too strong. Your arteries are the blood vessels that carry blood from your heart throughout your body. A blood pressure reading consists of a higher number over a lower number, such as 110/72. The higher number (systolic) is the pressure inside  your arteries when your heart pumps. The lower number (diastolic) is the pressure inside your arteries when your heart relaxes. Ideally you want your blood pressure below 120/80. Hypertension forces your heart to work harder to pump blood. Your arteries may become narrow or stiff. Having untreated or uncontrolled hypertension can cause heart attack, stroke, kidney disease, and other problems. RISK FACTORS Some risk factors for high blood pressure are controllable. Others are not.  Risk factors you cannot control include:   Race. You may be at higher risk if you are African American.  Age. Risk increases with age.  Gender. Men are at higher risk than women before age 18 years. After age 7, women are at higher risk than men. Risk factors you can control include:  Not getting enough exercise or physical activity.  Being overweight.  Getting too much fat, sugar, calories, or salt in your diet.  Drinking too much alcohol. SIGNS AND SYMPTOMS Hypertension does not usually cause signs or symptoms. Extremely high blood pressure  (hypertensive crisis) may cause headache, anxiety, shortness of breath, and nosebleed. DIAGNOSIS To check if you have hypertension, your health care provider will measure your blood pressure while you are seated, with your arm held at the level of your heart. It should be measured at least twice using the same arm. Certain conditions can cause a difference in blood pressure between your right and left arms. A blood pressure reading that is higher than normal on one occasion does not mean that you need treatment. If it is not clear whether you have high blood pressure, you may be asked to return on a different day to have your blood pressure checked again. Or, you may be asked to monitor your blood pressure at home for 1 or more weeks. TREATMENT Treating high blood pressure includes making lifestyle changes and possibly taking medicine. Living a healthy lifestyle can help lower high blood pressure. You may need to change some of your habits. Lifestyle changes may include:  Following the DASH diet. This diet is high in fruits, vegetables, and whole grains. It is low in salt, red meat, and added sugars.  Keep your sodium intake below 2,300 mg per day.  Getting at least 30-45 minutes of aerobic exercise at least 4 times per week.  Losing weight if necessary.  Not smoking.  Limiting alcoholic beverages.  Learning ways to reduce stress. Your health care provider may prescribe medicine if lifestyle changes are not enough to get your blood pressure under control, and if one of the following is true:  You are 26-26 years of age and your systolic blood pressure is above 140.  You are 108 years of age or older, and your systolic blood pressure is above 150.  Your diastolic blood pressure is above 90.  You have diabetes, and your systolic blood pressure is over 267 or your diastolic blood pressure is over 90.  You have kidney disease and your blood pressure is above 140/90.  You have heart disease  and your blood pressure is above 140/90. Your personal target blood pressure may vary depending on your medical conditions, your age, and other factors. HOME CARE INSTRUCTIONS  Have your blood pressure rechecked as directed by your health care provider.   Take medicines only as directed by your health care provider. Follow the directions carefully. Blood pressure medicines must be taken as prescribed. The medicine does not work as well when you skip doses. Skipping doses also puts you at risk for problems.  Do not smoke.   Monitor your blood pressure at home as directed by your health care provider. SEEK MEDICAL CARE IF:   You think you are having a reaction to medicines taken.  You have recurrent headaches or feel dizzy.  You have swelling in your ankles.  You have trouble with your vision. SEEK IMMEDIATE MEDICAL CARE IF:  You develop a severe headache or confusion.  You have unusual weakness, numbness, or feel faint.  You have severe chest or abdominal pain.  You vomit repeatedly.  You have trouble breathing. MAKE SURE YOU:   Understand these instructions.  Will watch your condition.  Will get help right away if you are not doing well or get worse.   This information is not intended to replace advice given to you by your health care provider. Make sure you discuss any questions you have with your health care provider.   Document Released: 06/03/2005 Document Revised: 10/18/2014 Document Reviewed: 03/26/2013 Elsevier Interactive Patient Education [employer].

## 2015-08-14 NOTE — Progress Notes (Signed)
[place] at [facility] [address], [place], [place] [address] [phone]  Date:  08/14/2015   Name:  Christina [name]   DOB:  03-03-1946   MRN:  [id number]  PCP:  Kennon Portela, MD    Chief Complaint: Medication Refill   History of Present Illness:  Christina [name] is a 70 y.o. very pleasant female patient who presents with the following: Here for medication refill:   HTN: Currently taking norvasc daily. No swelling in legs. No dizziness.   HLD: Currently taking simvastatin '20mg'$  although she feels like she has muscle aches afterwards. She would like to switch.   GERD: Takes sucralfate and prilosec daily.  Previously had ulcer.  Still has continued heartburn.  Believes she had an endoscopy over  10 years ago.   Denies alcohol or drug use. Lives with her husband. No pets.  Currently working part time.   Last physical in August of 2015. Colonoscopy in 2015, repeat in 5 years.   Mammogram: normal in 04/2015.   No abnormal paps. Seen a [facility].   Patient Active Problem List   Diagnosis Date Noted  . 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 Angioedema 08/04/2014  . GERD (gastroesophageal reflux disease) 08/10/2013  . Other and unspecified hyperlipidemia 06/23/2013  . HTN (hypertension) 07/23/2012  . Lipid disorder 07/23/2012    Past Medical History  Diagnosis Date  . Reflux   . Hypertension   . Hyperlipidemia   . GERD (gastroesophageal reflux disease)   . Allergy     Past Surgical History  Procedure Laterality Date  . Cesarean section      1 time    Social History  Substance Use Topics  . Smoking status: Former Smoker -- 0.50 packs/day    Types: Cigarettes    Quit date: 08/03/2013  . Smokeless tobacco: Never Used  . Alcohol Use: No    Family History  Problem Relation Age of Onset  . Hypertension Mother   . Leukemia Father     Allergies  Allergen Reactions  . Coconut  Oil     HIVES  . Codeine Other (See Comments)    Upsets stomach really bad  . Trandolapril Swelling    Swelling of the face/lips 07/2014    Medication list has been reviewed and updated.  Current Outpatient Prescriptions on File Prior to Visit  Medication Sig Dispense Refill  . amLODipine (NORVASC) 10 MG tablet TAKE 1 TABLET (10 MG) BY MOUTH DAILY 90 tablet 0  . EPINEPHrine (EPIPEN) 0.3 mg/0.3 mL DEVI Inject 0.3 mLs (0.3 mg total) into the muscle once. 1 Device 5  . LORazepam (ATIVAN) 0.5 MG tablet Take 1 tablet (0.5 mg total) by mouth 2 (two) times daily as needed for anxiety. 60 tablet 0  . omeprazole (PRILOSEC) 20 MG capsule TAKE 1 CAPSULE(20 MG) BY MOUTH DAILY 90 capsule 1  . simvastatin (ZOCOR) 20 MG tablet TAKE 1 TABLET BY MOUTH DAILY AT 6:00 PM 30 tablet 0  . sucralfate (CARAFATE) 1 g tablet TAKE 1 TABLET BY MOUTH 1 HOURS BEFORE A MEAL AT BEDTIME AS NEEDED 120 tablet 0  . albuterol (PROAIR HFA) 108 (90 BASE) MCG/ACT inhaler Inhale 2 puffs into the lungs every 6 (six) hours as needed for wheezing or shortness of breath. (Patient not taking: Reported on 08/14/2015) 8.5 g 1  . cetirizine (ZYRTEC) 10 MG tablet Take 1 tablet (10 mg total) by mouth daily. (Patient not taking:  Reported on 08/14/2015) 30 tablet 11  . ipratropium (ATROVENT) 0.03 % nasal spray Place 2 sprays into both nostrils 2 (two) times daily. (Patient not taking: Reported on 08/14/2015) 30 mL 0   No current facility-administered medications on file prior to visit.    Review of Systems:  Review of Systems  Constitutional: Negative for fever, chills, weight loss and malaise/fatigue.  HENT: Negative for congestion and sore throat.   Eyes: Negative for blurred vision.  Respiratory: Positive for wheezing (occasionally. ). Negative for cough, hemoptysis and shortness of breath.   Cardiovascular: Negative for chest pain, palpitations, orthopnea, leg swelling and PND.  Gastrointestinal: Negative for nausea, vomiting, abdominal  pain, diarrhea and constipation.  Musculoskeletal: Positive for myalgias (after simvastatin. ).  Skin: Negative for rash.  Neurological: Negative for dizziness and headaches.  Psychiatric/Behavioral: Negative for depression and suicidal ideas.    Physical Examination: Filed Vitals:   08/14/15 0944  BP: 136/84  Pulse: 66  Temp: 98.5 F (36.9 C)  Resp: 16   Filed Vitals:   08/14/15 0944  Height: '5\' 8"'$  (1.727 m)  Weight: 169 lb 9.6 oz (76.93 kg)   Body mass index is 25.79 kg/(m^2). Ideal Body Weight: Weight in (lb) to have BMI = 25: 164.1  GEN: WDWN, NAD, Non-toxic, A & O x 3 HEENT: Atraumatic, Normocephalic. Neck supple. No masses, No LAD. Ears and Nose: No external deformity. CV: RRR, No M/G/R. No JVD. No thrill. No extra heart sounds. PULM: CTA B, no wheezes, crackles, rhonchi. No retractions. No resp. distress. No accessory muscle use. ABD: S, NT, ND, +BS. No rebound. No HSM. EXTR: No c/c/e NEURO Normal gait.  PSYCH: Normally interactive. Conversant. Not depressed or anxious appearing.  Calm demeanor.   Assessment and Plan: HTN: refilling amolodine. No Side effects. CMP GERD: refilling meds. Long term PPI use. Refer to GI for w/u of chronic reflux.  HLD: lipid panel & change to lipitor due to muscle aches with simvastatin.   HME: Vit D, DEXA, prevnar, CMP, zostavax at pharmacy, lipid panel. Colonoscopy in 2015 with 5 year f/u. No concerns.  Mammogram in 04/2015.  No abnormal paps, followed at gynecologist. Flu shot this fall already.   Signed Gerre Pebbles, MD

## 2015-08-15 ENCOUNTER — Other Ambulatory Visit: Payer: Self-pay | Admitting: Family Medicine

## 2015-08-15 DIAGNOSIS — E559 Vitamin D deficiency, unspecified: Secondary | ICD-10-CM

## 2015-08-15 LAB — VITAMIN D 25 HYDROXY (VIT D DEFICIENCY, FRACTURES): VIT D 25 HYDROXY: 11 ng/mL — AB (ref 30–100)

## 2015-08-15 MED ORDER — ERGOCALCIFEROL 1.25 MG (50000 UT) PO CAPS: 50000.0000 [IU] | ORAL_CAPSULE | ORAL | Status: DC

## 2015-08-17 ENCOUNTER — Other Ambulatory Visit: Payer: Self-pay

## 2015-08-17 DIAGNOSIS — E2839 Other primary ovarian failure: Secondary | ICD-10-CM

## 2015-09-19 ENCOUNTER — Other Ambulatory Visit: Payer: Self-pay

## 2015-09-19 DIAGNOSIS — K219 Gastro-esophageal reflux disease without esophagitis: Secondary | ICD-10-CM

## 2015-09-19 MED ORDER — SUCRALFATE 1 G PO TABS: ORAL_TABLET | ORAL | Status: DC

## 2015-09-19 NOTE — Telephone Encounter (Signed)
Pt has GI ref appt on 4/12, but will refill until then.

## 2015-09-27 ENCOUNTER — Ambulatory Visit (INDEPENDENT_AMBULATORY_CARE_PROVIDER_SITE_OTHER): Payer: BLUE CROSS/BLUE SHIELD | Admitting: Gastroenterology

## 2015-09-27 ENCOUNTER — Encounter: Payer: Self-pay | Admitting: Gastroenterology

## 2015-09-27 VITALS — BP 132/84 | HR 64 | Ht 68.0 in | Wt 172.6 lb

## 2015-09-27 DIAGNOSIS — Z86010 Personal history of colonic polyps: Secondary | ICD-10-CM | POA: Diagnosis not present

## 2015-09-27 DIAGNOSIS — R1013 Epigastric pain: Secondary | ICD-10-CM

## 2015-09-27 MED ORDER — OMEPRAZOLE 40 MG PO CPDR: 40.0000 mg | DELAYED_RELEASE_CAPSULE | Freq: Every day | ORAL | Status: DC

## 2015-09-27 NOTE — Progress Notes (Signed)
    History of Present Illness: This is a 70 year old female who relates epigastric pain after meals. She states her symptoms occur almost after every meal and have been persistent for a few years. She was treated with omeprazole 20 mg daily which slightly improved her symptoms. Carafate before meals was added and this has improved her symptoms as well. EGD performed by Dr. Amedeo Plenty in 2003 showed mild gastritis. Denies weight loss, constipation, diarrhea, change in stool caliber, melena, hematochezia, nausea, vomiting, dysphagia, chest pain.   Current Medications, Allergies, Past Medical History, Past Surgical History, Family History and Social History were reviewed in [employer] record.  Physical Exam: General: Well developed, well nourished, no acute distress Head: Normocephalic and atraumatic Eyes:  sclerae anicteric, EOMI Ears: Normal auditory acuity Mouth: No deformity or lesions Lungs: Clear throughout to auscultation Heart: Regular rate and rhythm; no murmurs, rubs or bruits Abdomen: Soft, non tender and non distended. No masses, hepatosplenomegaly or hernias noted. Normal Bowel sounds Musculoskeletal: Symmetrical with no gross deformities  Pulses:  Normal pulses noted Extremities: No clubbing, cyanosis, edema or deformities noted Neurological: Alert oriented x 4, grossly nonfocal Psychological:  Alert and cooperative. Normal mood and affect  Assessment and Recommendations:  1. Postprandial epigastric pain. Rule out cholelithiasis, GERD, gastritis. Increase omeprazole to 40 mg daily. Continue Carafate before meals. Schedule abdominal ultrasound and EGD for further evaluation. The risks (including bleeding, perforation, infection, missed lesions, medication reactions and possible hospitalization or surgery if complications occur), benefits, and alternatives to endoscopy with possible biopsy and possible dilation were discussed with the patient and they consent to  proceed.   2. Personal history of adenomatous colon polyps. Five-year interval surveillance colonoscopy recommended in October 2020.

## 2015-09-27 NOTE — Addendum Note (Signed)
Addended by: Marlon Pel L on: 09/27/2015 10:00 AM   Modules accepted: Orders, Medications

## 2015-09-27 NOTE — Patient Instructions (Addendum)
We have sent the following medications to your pharmacy for you to pick up at your convenience:omeprazole 40 mg daily.  You have been scheduled for an abdominal ultrasound at [facility] (1st floor of hospital) on 10/04/15 at 9:00am. Please arrive 15 minutes prior to your appointment for registration. Make certain not to have anything to eat or drink 6 hours prior to your appointment. Should you need to reschedule your appointment, please contact radiology at [phone]. This test typically takes about 30 minutes to perform.  You have been scheduled for an endoscopy. Please follow written instructions given to you at your visit today. If you use inhalers (even only as needed), please bring them with you on the day of your procedure. Your physician has requested that you go to www.startemmi.com and enter the access code given to you at your visit today. This web site gives a general overview about your procedure. However, you should still follow specific instructions given to you by our office regarding your preparation for the procedure.  Normal BMI (Body Mass Index- based on height and weight) is between 19 and 25. Your BMI today is Body mass index is 26.25 kg/(m^2). Marland Kitchen Please consider follow up  regarding your BMI with your Primary Care Provider.  Thank you for choosing me and [facility].  Pricilla Riffle. Dagoberto Ligas., MD., Marval Regal

## 2015-10-04 ENCOUNTER — Ambulatory Visit (HOSPITAL_COMMUNITY)
Admission: RE | Admit: 2015-10-04 | Discharge: 2015-10-04 | Disposition: A | Discharge status: Home / Self Care | Payer: BLUE CROSS/BLUE SHIELD | Source: Ambulatory Visit | Attending: Gastroenterology | Admitting: Gastroenterology

## 2015-10-04 DIAGNOSIS — R1013 Epigastric pain: Secondary | ICD-10-CM | POA: Diagnosis present

## 2015-10-04 DIAGNOSIS — R140 Abdominal distension (gaseous): Secondary | ICD-10-CM | POA: Insufficient documentation

## 2015-10-04 DIAGNOSIS — I700 Atherosclerosis of aorta: Secondary | ICD-10-CM | POA: Insufficient documentation

## 2015-10-11 ENCOUNTER — Encounter: Payer: Self-pay | Admitting: Gastroenterology

## 2015-10-11 ENCOUNTER — Ambulatory Visit (AMBULATORY_SURGERY_CENTER): Payer: BLUE CROSS/BLUE SHIELD | Admitting: Gastroenterology

## 2015-10-11 VITALS — BP 143/84 | HR 56 | Temp 97.1°F | Resp 15 | Ht 68.0 in | Wt 172.0 lb

## 2015-10-11 DIAGNOSIS — R1013 Epigastric pain: Secondary | ICD-10-CM | POA: Diagnosis present

## 2015-10-11 MED ORDER — SODIUM CHLORIDE 0.9 % IV SOLN: 500.0000 mL | INTRAVENOUS | Status: DC

## 2015-10-11 NOTE — Patient Instructions (Signed)
YOU HAD AN ENDOSCOPIC PROCEDURE TODAY AT THE [facility]:   Refer to the procedure report that was given to you for any specific questions about what was found during the examination.  If the procedure report does not answer your questions, please call your gastroenterologist to clarify.  If you requested that your care partner not be given the details of your procedure findings, then the procedure report has been included in a sealed envelope for you to review at your convenience later.  YOU SHOULD EXPECT: Some feelings of bloating in the abdomen. Passage of more gas than usual.  Walking can help get rid of the air that was put into your GI tract during the procedure and reduce the bloating. If you had a lower endoscopy (such as a colonoscopy or flexible sigmoidoscopy) you may notice spotting of blood in your stool or on the toilet paper. If you underwent a bowel prep for your procedure, you may not have a normal bowel movement for a few days.  Please Note:  You might notice some irritation and congestion in your nose or some drainage.  This is from the oxygen used during your procedure.  There is no need for concern and it should clear up in a day or so.  SYMPTOMS TO REPORT IMMEDIATELY:   Following upper endoscopy (EGD)  Vomiting of blood or coffee ground material  New chest pain or pain under the shoulder blades  Painful or persistently difficult swallowing  New shortness of breath  Fever of 100F or higher  Black, tarry-looking stools  For urgent or emergent issues, a gastroenterologist can be reached at any hour by calling [phone].   DIET: Your first meal following the procedure should be a small meal and then it is ok to progress to your normal diet. Heavy or fried foods are harder to digest and may make you feel nauseous or bloated.  Likewise, meals heavy in dairy and vegetables can increase bloating.  Drink plenty of fluids but you should avoid alcoholic beverages for  24 hours.  ACTIVITY:  You should plan to take it easy for the rest of today and you should NOT DRIVE or use heavy machinery until tomorrow (because of the sedation medicines used during the test).    FOLLOW UP: Our staff will call the number listed on your records the next business day following your procedure to check on you and address any questions or concerns that you may have regarding the information given to you following your procedure. If we do not reach you, we will leave a message.  However, if you are feeling well and you are not experiencing any problems, there is no need to return our call.  We will assume that you have returned to your regular daily activities without incident.  If any biopsies were taken you will be contacted by phone or by letter within the next 1-3 weeks.  Please call us at [phone] if you have not heard about the biopsies in 3 weeks.    SIGNATURES/CONFIDENTIALITY: You and/or your care partner have signed paperwork which will be entered into your electronic medical record.  These signatures attest to the fact that that the information above on your After Visit Summary has been reviewed and is understood.  Full responsibility of the confidentiality of this discharge information lies with you and/or your care-partner.  Thank you for letting us take care of your healthcare needs. 

## 2015-10-11 NOTE — Op Note (Signed)
[facility] Patient Name: Christina [name] Procedure Date: 10/11/2015 10:40 AM MRN: [id number] Endoscopist: Ladene Artist , MD Age: 70 Date of Birth: Aug 19, 1945 Gender: Female Procedure:                Upper GI endoscopy Indications:              Epigastric abdominal pain Medicines:                Monitored Anesthesia Care Procedure:                Pre-Anesthesia Assessment:                           - Prior to the procedure, a History and Physical                            was performed, and patient medications and                            allergies were reviewed. The patient's tolerance of                            previous anesthesia was also reviewed. The risks                            and benefits of the procedure and the sedation                            options and risks were discussed with the patient.                            All questions were answered, and informed consent                            was obtained. Prior Anticoagulants: The patient has                            taken no previous anticoagulant or antiplatelet                            agents. ASA Grade Assessment: II - A patient with                            mild systemic disease. After reviewing the risks                            and benefits, the patient was deemed in                            satisfactory condition to undergo the procedure.                           After obtaining informed consent, the endoscope was  passed under direct vision. Throughout the                            procedure, the patient's blood pressure, pulse, and                            oxygen saturations were monitored continuously. The                            Model GIF-[id number]) scope was introduced                            through the mouth, and advanced to the second part                            of duodenum. The upper GI endoscopy was      accomplished without difficulty. The patient                            tolerated the procedure well. Scope In: Scope Out: Findings:                 The esophagus was normal.                           The stomach was normal.                           The examined duodenum was normal.                           The cardia and gastric fundus were normal on                            retroflexion. Complications:            No immediate complications. Estimated Blood Loss:     Estimated blood loss: none. Impression:               - Normal EGD Recommendation:           - Patient has a contact number available for                            emergencies. The signs and symptoms of potential                            delayed complications were discussed with the                            patient. Return to normal activities tomorrow.                            Written discharge instructions were provided to the                            patient.                           -  Resume previous diet.                           - Continue present medications. Ladene Artist, MD 10/11/2015 10:56:03 AM This report has been signed electronically.

## 2015-10-11 NOTE — Progress Notes (Signed)
Report to PACU, RN, vss, BBS= Clear.  

## 2015-10-12 ENCOUNTER — Telehealth: Payer: Self-pay | Admitting: *Deleted

## 2015-10-12 NOTE — Telephone Encounter (Signed)
  Follow up Call-  Call back number 10/11/2015 04/14/2014  Post procedure Call Back phone  # [phone]  Permission to leave phone message Yes Yes     Patient questions:  Do you have a fever, pain , or abdominal swelling? No. Pain Score  0 *  Have you tolerated food without any problems? Yes.    Have you been able to return to your normal activities? Yes.    Do you have any questions about your discharge instructions: Diet   No. Medications  No. Follow up visit  No.  Do you have questions or concerns about your Care? No.  Actions: * If pain score is 4 or above: No action needed, pain <4.

## 2015-10-23 ENCOUNTER — Ambulatory Visit (INDEPENDENT_AMBULATORY_CARE_PROVIDER_SITE_OTHER): Payer: BLUE CROSS/BLUE SHIELD | Admitting: Urgent Care

## 2015-10-23 VITALS — BP 134/80 | HR 60 | Temp 97.9°F | Resp 18 | Ht 68.0 in | Wt 170.4 lb

## 2015-10-23 DIAGNOSIS — R1013 Epigastric pain: Secondary | ICD-10-CM

## 2015-10-23 DIAGNOSIS — E559 Vitamin D deficiency, unspecified: Secondary | ICD-10-CM

## 2015-10-23 DIAGNOSIS — K219 Gastro-esophageal reflux disease without esophagitis: Secondary | ICD-10-CM

## 2015-10-23 LAB — POCT CBC
GRANULOCYTE PERCENT: 58.1 % (ref 37–80)
HCT, POC: 41 % (ref 37.7–47.9)
Hemoglobin: 14 g/dL (ref 12.2–16.2)
Lymph, poc: 2.6 (ref 0.6–3.4)
MCH: 30.3 pg (ref 27–31.2)
MCHC: 34.2 g/dL (ref 31.8–35.4)
MCV: 88.5 fL (ref 80–97)
MID (cbc): 0.3 (ref 0–0.9)
MPV: 8.2 fL (ref 0–99.8)
PLATELET COUNT, POC: 286 10*3/uL (ref 142–424)
POC Granulocyte: 4.1 (ref 2–6.9)
POC LYMPH PERCENT: 37 %L (ref 10–50)
POC MID %: 4.9 %M (ref 0–12)
RBC: 4.63 M/uL (ref 4.04–5.48)
RDW, POC: 15.2 %
WBC: 7.1 10*3/uL (ref 4.6–10.2)

## 2015-10-23 MED ORDER — FAMOTIDINE 20 MG PO TABS: 20.0000 mg | ORAL_TABLET | Freq: Two times a day (BID) | ORAL | Status: DC

## 2015-10-23 NOTE — Progress Notes (Signed)
MRN: [id number] DOB: [birth year]  Subjective:   Christina [name] is a 70 y.o. female presenting for follow up on [place] results, Vitamin D deficiency.   [place] review - patient reports that she was having difficulty with her stomach, acid reflux. She saw Dr. Jimmye Norman who ordered an abdominal US. She has also seen Dr. Fuller Plan who performed and endoscopy in early April, had normal findings. She presents today wanting to have those results reviewed, she was advised to f/u with [place]. She has no f/u scheduled. Today, she reports that she continues to have upset stomach after eating, has epigastric pain that lasts ~2 minutes after each meal. Has been taking omeprazole and sucralfate with good relief. She denies fever, n/v, constipation, diarrhea, dysuria, hematuria. Denies alcohol use.  Vitamin D deficiency - reports that she is currently out of her vitamin d supplement. She would like to have her levels rechecked.  Christina [name] has a current medication list which includes the following prescription(s): albuterol, amlodipine, aspirin, atorvastatin, epinephrine, ergocalciferol, omeprazole, sucralfate, and ipratropium. Also is allergic to coconut oil; codeine; and trandolapril.  Christina [name]  has a past medical history of Reflux; Hypertension; Hyperlipidemia; GERD (gastroesophageal reflux disease); Allergy; and Tubulovillous adenoma of colon (2003). Also  has past surgical history that includes Cesarean section and Colonoscopy.  Objective:   Vitals: BP 134/80 mmHg  Pulse 60  Temp(Src) 97.9 F (36.6 C) (Oral)  Resp 18  Ht '5\' 8"'$  (1.727 m)  Wt 170 lb 6.4 oz (77.293 kg)  BMI 25.92 kg/m2  SpO2 98%  Physical Exam  Constitutional: She is oriented to person, place, and time. She appears well-developed and well-nourished.  HENT:  Mouth/Throat: Oropharynx is clear and moist.  Eyes: No scleral icterus.  Cardiovascular: Normal rate, regular rhythm and intact distal pulses.  Exam reveals no gallop and no friction rub.   No murmur  heard. Pulmonary/Chest: No respiratory distress. She has no wheezes. She has no rales.  Abdominal: Soft. Bowel sounds are normal. She exhibits no distension and no mass. There is tenderness (epigastric).  Neurological: She is alert and oriented to person, place, and time.  Skin: Skin is warm and dry.    Results for orders placed or performed in visit on 10/23/15 (from the past 24 hour(s))  POCT CBC     Status: None   Collection Time: 10/23/15 10:41 AM  Result Value Ref Range   WBC 7.1 4.6 - 10.2 K/uL   Lymph, poc 2.6 0.6 - 3.4   POC LYMPH PERCENT 37.0 10 - 50 %L   MID (cbc) 0.3 0 - 0.9   POC MID % 4.9 0 - 12 %M   POC Granulocyte 4.1 2 - 6.9   Granulocyte percent 58.1 37 - 80 %G   RBC 4.63 4.04 - 5.48 M/uL   Hemoglobin 14.0 12.2 - 16.2 g/dL   HCT, POC 41.0 37.7 - 47.9 %   MCV 88.5 80 - 97 fL   MCH, POC 30.3 27 - 31.2 pg   MCHC 34.2 31.8 - 35.4 g/dL   RDW, POC 15.2 %   Platelet Count, POC 286 142 - 424 K/uL   MPV 8.2 0 - 99.8 fL    Assessment and Plan :   1. Abdominal pain, epigastric - Labs pending - Unclear etiology. I reassured patient that she has had good work-up but I will continue to help her with more testing. Patient agreed.  2. Gastroesophageal reflux disease without esophagitis - Start famotidine, counseled on dietary modifications, information  about GERD provided.  3. Vitamin D deficiency - Labs pending  Jaynee Eagles, PA-C [facility] [phone] 10/23/2015 10:14 AM

## 2015-10-23 NOTE — Patient Instructions (Addendum)
Abdominal Pain, Adult Many things can cause abdominal pain. Usually, abdominal pain is not caused by a disease and will improve without treatment. It can often be observed and treated at home. Your health care provider will do a physical exam and possibly order blood tests and X-rays to help determine the seriousness of your pain. However, in many cases, more time must pass before a clear cause of the pain can be found. Before that point, your health care provider may not know if you need more testing or further treatment. HOME CARE INSTRUCTIONS Monitor your abdominal pain for any changes. The following actions may help to alleviate any discomfort you are experiencing:  Only take over-the-counter or prescription medicines as directed by your health care provider.  Do not take laxatives unless directed to do so by your health care provider.  Try a clear liquid diet (broth, tea, or water) as directed by your health care provider. Slowly move to a bland diet as tolerated. SEEK MEDICAL CARE IF:  You have unexplained abdominal pain.  You have abdominal pain associated with nausea or diarrhea.  You have pain when you urinate or have a bowel movement.  You experience abdominal pain that wakes you in the night.  You have abdominal pain that is worsened or improved by eating food.  You have abdominal pain that is worsened with eating fatty foods.  You have a fever. SEEK IMMEDIATE MEDICAL CARE IF:  Your pain does not go away within 2 hours.  You keep throwing up (vomiting).  Your pain is felt only in portions of the abdomen, such as the right side or the left lower portion of the abdomen.  You pass bloody or black tarry stools. MAKE SURE YOU:  Understand these instructions.  Will watch your condition.  Will get help right away if you are not doing well or get worse.   This information is not intended to replace advice given to you by your health care provider. Make sure you discuss  any questions you have with your health care provider.   Document Released: 03/13/2005 Document Revised: 02/22/2015 Document Reviewed: 02/10/2013 Elsevier Interactive Patient Education 2016 [place] for Gastroesophageal Reflux Disease, Adult When you have gastroesophageal reflux disease (GERD), the foods you eat and your eating habits are very important. Choosing the right foods can help ease the discomfort of GERD. WHAT GENERAL GUIDELINES DO I NEED TO FOLLOW?  Choose fruits, vegetables, whole grains, low-fat dairy products, and low-fat meat, fish, and poultry.  Limit fats such as oils, salad dressings, butter, nuts, and avocado.  Keep a food diary to identify foods that cause symptoms.  Avoid foods that cause reflux. These may be different for different people.  Eat frequent small meals instead of three large meals each day.  Eat your meals slowly, in a relaxed setting.  Limit fried foods.  Cook foods using methods other than frying.  Avoid drinking alcohol.  Avoid drinking large amounts of liquids with your meals.  Avoid bending over or lying down until 2-3 hours after eating. WHAT FOODS ARE NOT RECOMMENDED? The following are some foods and drinks that may worsen your symptoms: Vegetables Tomatoes. Tomato juice. Tomato and spaghetti sauce. Chili peppers. Onion and garlic. Horseradish. Fruits Oranges, grapefruit, and lemon (fruit and juice). Meats High-fat meats, fish, and poultry. This includes hot dogs, ribs, ham, sausage, salami, and bacon. Dairy Whole milk and chocolate milk. Sour cream. Cream. Butter. Ice cream. Cream cheese.  Beverages Coffee and tea, with or without caffeine. Carbonated beverages or energy drinks. Condiments Hot sauce. Barbecue sauce.  Sweets/Desserts Chocolate and cocoa. Donuts. Peppermint and spearmint. Fats and Oils High-fat foods, including [place] fries and potato chips. Other Vinegar. Strong spices, such as  black pepper, white pepper, red pepper, cayenne, curry powder, cloves, ginger, and chili powder. The items listed above may not be a complete list of foods and beverages to avoid. Contact your dietitian for more information.   This information is not intended to replace advice given to you by your health care provider. Make sure you discuss any questions you have with your health care provider.   Document Released: 06/03/2005 Document Revised: 06/24/2014 Document Reviewed: 04/07/2013 Elsevier Interactive Patient Education 2016 [employer].     IF you received an x-ray today, you will receive an invoice from [facility]. Please contact [facility] at [phone] with questions or concerns regarding your invoice.   IF you received labwork today, you will receive an invoice from [employer]. Please contact [facility] at [phone] with questions or concerns regarding your invoice.   Our billing staff will not be able to assist you with questions regarding bills from these companies.  You will be contacted with the lab results as soon as they are available. The fastest way to get your results is to activate your My Chart account. Instructions are located on the last page of this paperwork. If you have not heard from [place] regarding the results in 2 weeks, please contact this office.

## 2015-10-24 LAB — VITAMIN D 25 HYDROXY (VIT D DEFICIENCY, FRACTURES): Vit D, 25-Hydroxy: 39 ng/mL (ref 30–100)

## 2015-10-24 LAB — H. PYLORI BREATH TEST: H. pylori Breath Test: NOT DETECTED

## 2015-11-06 ENCOUNTER — Other Ambulatory Visit: Payer: Self-pay | Admitting: Physician Assistant

## 2015-12-22 ENCOUNTER — Telehealth: Payer: Self-pay

## 2015-12-22 ENCOUNTER — Other Ambulatory Visit: Payer: Self-pay | Admitting: Internal Medicine

## 2015-12-22 ENCOUNTER — Other Ambulatory Visit: Payer: Self-pay | Admitting: Family Medicine

## 2015-12-22 DIAGNOSIS — E785 Hyperlipidemia, unspecified: Secondary | ICD-10-CM

## 2015-12-22 NOTE — Telephone Encounter (Signed)
Pt would like a refill on her atorvastatin (LIPITOR) 40 MG tablet [[id number]]. Pharmacy:  [id number] - [place], [address] CB # [phone]

## 2015-12-25 MED ORDER — ATORVASTATIN CALCIUM 40 MG PO TABS: 40.0000 mg | ORAL_TABLET | Freq: Every day | ORAL | Status: DC

## 2015-12-25 NOTE — Telephone Encounter (Signed)
Sent in Rf and notified pt on VM that both it and amlodipine were sent in. Also reminded pt that she is due to come back for re-check on chol w/in a month or so.

## 2016-03-22 ENCOUNTER — Ambulatory Visit (INDEPENDENT_AMBULATORY_CARE_PROVIDER_SITE_OTHER): Payer: BLUE CROSS/BLUE SHIELD | Admitting: Physician Assistant

## 2016-03-22 VITALS — BP 122/70 | HR 70 | Temp 98.0°F | Resp 18 | Ht 68.0 in | Wt 178.0 lb

## 2016-03-22 DIAGNOSIS — I10 Essential (primary) hypertension: Secondary | ICD-10-CM

## 2016-03-22 DIAGNOSIS — K219 Gastro-esophageal reflux disease without esophagitis: Secondary | ICD-10-CM

## 2016-03-22 DIAGNOSIS — Z760 Encounter for issue of repeat prescription: Secondary | ICD-10-CM

## 2016-03-22 DIAGNOSIS — T7840XD Allergy, unspecified, subsequent encounter: Secondary | ICD-10-CM | POA: Diagnosis not present

## 2016-03-22 DIAGNOSIS — E785 Hyperlipidemia, unspecified: Secondary | ICD-10-CM

## 2016-03-22 DIAGNOSIS — R062 Wheezing: Secondary | ICD-10-CM

## 2016-03-22 DIAGNOSIS — Z23 Encounter for immunization: Secondary | ICD-10-CM | POA: Diagnosis not present

## 2016-03-22 LAB — CBC WITH DIFFERENTIAL/PLATELET
BASOS PCT: 1 %
Basophils Absolute: 59 cells/uL (ref 0–200)
EOS ABS: 177 {cells}/uL (ref 15–500)
Eosinophils Relative: 3 %
HEMATOCRIT: 39.9 % (ref 35.0–45.0)
HEMOGLOBIN: 13.3 g/dL (ref 11.7–15.5)
LYMPHS PCT: 40 %
Lymphs Abs: 2360 cells/uL (ref 850–3900)
MCH: 29 pg (ref 27.0–33.0)
MCHC: 33.3 g/dL (ref 32.0–36.0)
MCV: 87.1 fL (ref 80.0–100.0)
MONO ABS: 413 {cells}/uL (ref 200–950)
MPV: 11 fL (ref 7.5–12.5)
Monocytes Relative: 7 %
NEUTROS PCT: 49 %
Neutro Abs: 2891 cells/uL (ref 1500–7800)
Platelets: 348 10*3/uL (ref 140–400)
RBC: 4.58 MIL/uL (ref 3.80–5.10)
RDW: 15 % (ref 11.0–15.0)
WBC: 5.9 10*3/uL (ref 3.8–10.8)

## 2016-03-22 LAB — COMPREHENSIVE METABOLIC PANEL
ALT: 8 U/L (ref 6–29)
AST: 14 U/L (ref 10–35)
Albumin: 3.8 g/dL (ref 3.6–5.1)
Alkaline Phosphatase: 67 U/L (ref 33–130)
BUN: 12 mg/dL (ref 7–25)
CHLORIDE: 105 mmol/L (ref 98–110)
CO2: 28 mmol/L (ref 20–31)
Calcium: 9.2 mg/dL (ref 8.6–10.4)
Creat: 0.87 mg/dL (ref 0.50–0.99)
GLUCOSE: 86 mg/dL (ref 65–99)
POTASSIUM: 3.9 mmol/L (ref 3.5–5.3)
SODIUM: 139 mmol/L (ref 135–146)
TOTAL PROTEIN: 6.7 g/dL (ref 6.1–8.1)
Total Bilirubin: 0.5 mg/dL (ref 0.2–1.2)

## 2016-03-22 LAB — LIPID PANEL
CHOL/HDL RATIO: 2.8 ratio (ref <=5.0)
Cholesterol: 152 mg/dL (ref 125–200)
HDL: 55 mg/dL (ref >=46)
LDL CALC: 77 mg/dL (ref <130)
TRIGLYCERIDES: 98 mg/dL (ref <150)
VLDL: 20 mg/dL (ref <30)

## 2016-03-22 MED ORDER — OMEPRAZOLE 20 MG PO CPDR: 20.0000 mg | DELAYED_RELEASE_CAPSULE | Freq: Every day | ORAL | 1 refills | Status: DC

## 2016-03-22 MED ORDER — ZOSTER VACCINE LIVE 19400 UNT/0.65ML ~~LOC~~ SUSR: 0.6500 mL | Freq: Once | SUBCUTANEOUS | 0 refills | Status: AC

## 2016-03-22 MED ORDER — ATORVASTATIN CALCIUM 40 MG PO TABS
40.0000 mg | ORAL_TABLET | Freq: Every day | ORAL | 0 refills | Status: DC
Start: 2016-03-22 — End: 2016-08-06

## 2016-03-22 MED ORDER — ALBUTEROL SULFATE HFA 108 (90 BASE) MCG/ACT IN AERS: 2.0000 | INHALATION_SPRAY | Freq: Four times a day (QID) | RESPIRATORY_TRACT | 1 refills | Status: DC | PRN

## 2016-03-22 MED ORDER — AMLODIPINE BESYLATE 10 MG PO TABS: ORAL_TABLET | ORAL | 0 refills | Status: DC

## 2016-03-22 NOTE — Progress Notes (Signed)
Subjective:    Patient ID: Christina [name], female    DOB: Apr 16, 1946, 70 y.o.   MRN: [id number] Chief Complaint  Patient presents with  . Medication Refill    All meds  . blood work    HPI Patient is a 70 yo female w a hx of HTN, GERD and Hyperlipidemia who presents today for medication refill. Patient reports no complaints and is tolerating her medications well. Checks own BP, last numbers in the 130/70s last week. Patient just recently seen for hip pain 1 month ago at [facility]. Was prescribed Naproxen and Tramadol. Reports not taking pain meds anymore as hip pain is better.    Patient Active Problem List   Diagnosis Date Noted  . 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 Angioedema 08/04/2014  . GERD (gastroesophageal reflux disease) 08/10/2013  . Other and unspecified hyperlipidemia 06/23/2013  . HTN (hypertension) 07/23/2012  . Lipid disorder 07/23/2012   Past Medical History:  Diagnosis Date  . Allergy   . GERD (gastroesophageal reflux disease)   . Hyperlipidemia   . Hypertension   . Reflux   . Tubulovillous adenoma of colon 2003   Family History  Problem Relation Age of Onset  . Hypertension Mother   . Leukemia Father    Social History   Social History  . Marital status: Married    Spouse name: [name]  . Number of children: 1  . Years of education: 12th grade   Occupational History  . school housekeeping     part-time   Social History Main Topics  . Smoking status: Former Smoker    Packs/day: 0.50    Types: Cigarettes    Quit date: 08/03/2013  . Smokeless tobacco: Never Used  . Alcohol use No  . Drug use: No  . Sexual activity: No   Other Topics Concern  . Not on file   Social History Narrative   Lives with her husband. Her daughter also lives in [place] with her husband and 3 children.   Prior to Admission medications   Medication Sig Start Date End Date Taking? Authorizing Provider  albuterol (PROAIR HFA) 108 (90 BASE)  MCG/ACT inhaler Inhale 2 puffs into the lungs every 6 (six) hours as needed for wheezing or shortness of breath. 05/30/15  Yes Stephanie D English, PA  amLODipine (NORVASC) 10 MG tablet TAKE 1 TABLET (10 MG) BY MOUTH DAILY 08/14/15  Yes Gerre Pebbles, MD  aspirin 81 MG tablet Take 81 mg by mouth daily.   Yes Historical Provider, MD  atorvastatin (LIPITOR) 40 MG tablet Take 1 tablet (40 mg total) by mouth daily. 12/25/15  Yes Jaynee Eagles, PA-C  EPINEPHrine (EPIPEN) 0.3 mg/0.3 mL DEVI Inject 0.3 mLs (0.3 mg total) into the muscle once. 11/16/11  Yes Darlyne Russian, MD  omeprazole (PRILOSEC) 20 MG capsule Take 20 mg by mouth daily.   Yes Historical Provider, MD  sucralfate (CARAFATE) 1 g tablet TAKE 1 TABLET BY MOUTH 1 HOUR BEFORE MEALS AND AT BEDTIME AS NEEDED 11/08/15  Yes Jaynee Eagles, PA-C  traMADol (ULTRAM) 50 MG tablet Take by mouth. 02/27/16  Yes Historical Provider, MD  naproxen (NAPROSYN) 500 MG tablet  02/27/16   Historical Provider, MD     Review of Systems  Constitutional: Negative for chills, fatigue and fever.  HENT: Positive for congestion. Negative for ear discharge, ear pain, rhinorrhea, sore throat and voice change.   Eyes: Negative for visual disturbance.  Respiratory: Negative for cough, choking, chest tightness, shortness of  breath and wheezing.   Cardiovascular: Negative for chest pain, palpitations and leg swelling.  Gastrointestinal: Negative for abdominal distention, diarrhea, nausea and vomiting.  Endocrine: Negative for polyuria.  Genitourinary: Negative for dysuria, frequency and urgency.  Musculoskeletal: Negative for arthralgias and myalgias.  Skin: Negative for rash.  Neurological: Negative for dizziness, numbness and headaches.  Psychiatric/Behavioral: Negative for agitation and behavioral problems.       Objective:   Physical Exam  Constitutional: She appears well-developed and well-nourished. No distress.  HENT:  Head: Normocephalic and atraumatic.  Right Ear:  External ear normal.  Left Ear: External ear normal. A middle ear effusion is present.  Mouth/Throat: No oropharyngeal exudate.  Eyes: Conjunctivae are normal. Pupils are equal, round, and reactive to light. Right eye exhibits no discharge. Left eye exhibits no discharge.  Neck: Neck supple. No thyromegaly present.  Cardiovascular: Normal rate, regular rhythm, normal heart sounds and intact distal pulses.  Exam reveals no gallop and no friction rub.   No murmur heard. Pulmonary/Chest: Effort normal. No respiratory distress. She has wheezes (Slight expiratory wheeze heard best at LLSB and RLSB). She exhibits no tenderness.  Adventitious lung sounds   Abdominal: Soft. Bowel sounds are normal. She exhibits no distension. There is no tenderness.  Musculoskeletal: She exhibits no edema or tenderness.  Lymphadenopathy:    She has no cervical adenopathy.  Neurological: She is alert. She displays normal reflexes.  Skin: Skin is warm and dry. She is not diaphoretic. No erythema.  Psychiatric: She has a normal mood and affect. Her behavior is normal.   BP 122/70 (BP Location: Right Arm, Patient Position: Sitting, Cuff Size: Small)   Pulse 70   Temp 98 F (36.7 C) (Oral)   Resp 18   Ht '5\' 8"'$  (1.727 m)   Wt 178 lb (80.7 kg)   SpO2 96%   BMI 27.06 kg/m       Assessment & Plan:  1. Medication refill  2. Essential hypertension Stable. Will refill medications  - amLODipine (NORVASC) 10 MG tablet; TAKE 1 TABLET (10 MG) BY MOUTH DAILY  Dispense: 90 tablet; Refill: 0 - POCT urinalysis dipstick - POCT Microscopic Urinalysis ([facility]) - CBC with Differential/Platelet  3. Hyperlipidemia, unspecified hyperlipidemia type Last labs done on 08/14/15 - showed elevated TC. Await labs for further guidance  - atorvastatin (LIPITOR) 40 MG tablet; Take 1 tablet (40 mg total) by mouth daily.  Dispense: 90 tablet; Refill: 0 - Lipid panel - Comprehensive metabolic panel  4. Expiratory wheezing  - albuterol  (PROAIR HFA) 108 (90 Base) MCG/ACT inhaler; Inhale 2 puffs into the lungs every 6 (six) hours as needed for wheezing or shortness of breath.  Dispense: 8.5 g; Refill: 1   5. Gastroesophageal reflux disease without esophagitis Stable. Will refill meds  - omeprazole (PRILOSEC) 20 MG capsule; Take 1 capsule (20 mg total) by mouth daily.  Dispense: 90 capsule; Refill: 1  7. Need for prophylactic vaccination and inoculation against influenza - Flu Vaccine QUAD 36+ mos IM  8. Need for shingles vaccine - Zoster Vaccine Live, PF, (ZOSTAVAX) 29562 UNT/0.65ML injection; Inject 19,400 Units into the skin once.  Dispense: 0.65 mL; Refill: 0

## 2016-03-22 NOTE — Patient Instructions (Signed)
     IF you received an x-ray today, you will receive an invoice from [facility]. Please contact [facility] at [phone] with questions or concerns regarding your invoice.   IF you received labwork today, you will receive an invoice from [facility]. Please contact [facility] at [phone] with questions or concerns regarding your invoice.   Our billing staff will not be able to assist you with questions regarding bills from these companies.  You will be contacted with the lab results as soon as they are available. The fastest way to get your results is to activate your My Chart account. Instructions are located on the last page of this paperwork. If you have not heard from us regarding the results in 2 weeks, please contact this office.      

## 2016-03-22 NOTE — Progress Notes (Signed)
Patient ID: Christina [name], female    DOB: 1946/02/11, 70 y.o.   MRN: [id number]  PCP: Kennon Portela, MD  Subjective:   Chief Complaint  Patient presents with  . Medication Refill    All meds  . blood work    HPI Presents for medication refills and fasting labs.  She feels well, has no complaints today. Monitors her BP at home, 130's/70's.  No CP, SOB, HA, dizziness.  Tolerates her medications well, without adverse effects.    Review of Systems No chest pain, SOB, HA, dizziness, vision change, N/V, diarrhea, constipation, dysuria, urinary urgency or frequency, myalgias, arthralgias or rash.     Patient Active Problem List   Diagnosis Date Noted  . 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 Angioedema 08/04/2014  . GERD (gastroesophageal reflux disease) 08/10/2013  . Other and unspecified hyperlipidemia 06/23/2013  . HTN (hypertension) 07/23/2012  . Lipid disorder 07/23/2012     Prior to Admission medications   Medication Sig Start Date End Date Taking? Authorizing Provider  albuterol (PROAIR HFA) 108 (90 Base) MCG/ACT inhaler Inhale 2 puffs into the lungs every 6 (six) hours as needed for wheezing or shortness of breath. 03/22/16  Yes Bobbe Quilter, PA-C  amLODipine (NORVASC) 10 MG tablet TAKE 1 TABLET (10 MG) BY MOUTH DAILY 03/22/16  Yes Reed Dady, PA-C  aspirin 81 MG tablet Take 81 mg by mouth daily.   Yes Historical Provider, MD  atorvastatin (LIPITOR) 40 MG tablet Take 1 tablet (40 mg total) by mouth daily. 03/22/16  Yes Yenni Carra, PA-C  EPINEPHrine (EPIPEN) 0.3 mg/0.3 mL DEVI Inject 0.3 mLs (0.3 mg total) into the muscle once. 11/16/11  Yes Darlyne Russian, MD  omeprazole (PRILOSEC) 20 MG capsule Take 1 capsule (20 mg total) by mouth daily. 03/22/16  Yes Deyton Ellenbecker, PA-C  sucralfate (CARAFATE) 1 g tablet TAKE 1 TABLET BY MOUTH 1 HOUR BEFORE MEALS AND AT BEDTIME AS NEEDED 11/08/15  Yes Jaynee Eagles, PA-C  traMADol (ULTRAM) 50 MG tablet Take  by mouth. 02/27/16  Yes Historical Provider, MD  naproxen (NAPROSYN) 500 MG tablet  02/27/16   Historical Provider, MD  Zoster Vaccine Live, PF, (ZOSTAVAX) 90240 UNT/0.65ML injection Inject 19,400 Units into the skin once. 03/22/16 03/22/16  Harrison Mons, PA-C     Allergies  Allergen Reactions  . Coconut Oil     HIVES  . Codeine Other (See Comments)    Upsets stomach really bad  . Trandolapril Swelling    Swelling of the face/lips 07/2014       Objective:  Physical Exam  Constitutional: She is oriented to person, place, and time. She appears well-developed and well-nourished. She is active and cooperative. No distress.  BP 122/70 (BP Location: Right Arm, Patient Position: Sitting, Cuff Size: Small)   Pulse 70   Temp 98 F (36.7 C) (Oral)   Resp 18   Ht '5\' 8"'$  (1.727 m)   Wt 178 lb (80.7 kg)   SpO2 96%   BMI 27.06 kg/m   HENT:  Head: Normocephalic and atraumatic.  Right Ear: Hearing normal.  Left Ear: Hearing normal.  Eyes: Conjunctivae are normal. No scleral icterus.  Neck: Normal range of motion. Neck supple. No thyromegaly present.  Cardiovascular: Normal rate, regular rhythm and normal heart sounds.   Pulses:      Radial pulses are 2+ on the right side, and 2+ on the left side.  Pulmonary/Chest: Effort normal and breath sounds normal.  Lymphadenopathy:  Head (right side): No tonsillar, no preauricular, no posterior auricular and no occipital adenopathy present.       Head (left side): No tonsillar, no preauricular, no posterior auricular and no occipital adenopathy present.    She has no cervical adenopathy.       Right: No supraclavicular adenopathy present.       Left: No supraclavicular adenopathy present.  Neurological: She is alert and oriented to person, place, and time. No sensory deficit.  Skin: Skin is warm, dry and intact. No rash noted. No cyanosis or erythema. Nails show no clubbing.  Psychiatric: She has a normal mood and affect. Her speech is normal  and behavior is normal.           Assessment & Plan:   1. Medication refill  2. Essential hypertension Well controlled. Continue current treatment - amLODipine (NORVASC) 10 MG tablet; TAKE 1 TABLET (10 MG) BY MOUTH DAILY  Dispense: 90 tablet; Refill: 0 - POCT urinalysis dipstick - POCT Microscopic Urinalysis ([facility]) - CBC with Differential/Platelet  3. Hyperlipidemia, unspecified hyperlipidemia type Await labs. Adjust regimen as indicated by results. - atorvastatin (LIPITOR) 40 MG tablet; Take 1 tablet (40 mg total) by mouth daily.  Dispense: 90 tablet; Refill: 0 - Lipid panel - Comprehensive metabolic panel  4. Expiratory wheezing None appreciated today.  - albuterol (PROAIR HFA) 108 (90 Base) MCG/ACT inhaler; Inhale 2 puffs into the lungs every 6 (six) hours as needed for wheezing or shortness of breath.  Dispense: 8.5 g; Refill: 1  5. Gastroesophageal reflux disease without esophagitis Controlled. - omeprazole (PRILOSEC) 20 MG capsule; Take 1 capsule (20 mg total) by mouth daily.  Dispense: 90 capsule; Refill: 1  7. Need for prophylactic vaccination and inoculation against influenza - Flu Vaccine QUAD 36+ mos IM  8. Need for shingles vaccine - Zoster Vaccine Live, PF, (ZOSTAVAX) 67544 UNT/0.65ML injection; Inject 19,400 Units into the skin once.  Dispense: 0.65 mL; Refill:    Return in about 6 months (around 09/20/2016) for re-evaluation of blood pressure and cholesterol.    Fara Chute, PA-C Physician Assistant-Certified [facility]

## 2016-04-25 ENCOUNTER — Ambulatory Visit (INDEPENDENT_AMBULATORY_CARE_PROVIDER_SITE_OTHER): Payer: BLUE CROSS/BLUE SHIELD | Admitting: Physician Assistant

## 2016-04-25 VITALS — BP 154/88 | HR 71 | Temp 98.1°F | Resp 17 | Ht 68.0 in | Wt 177.0 lb

## 2016-04-25 DIAGNOSIS — T7840XD Allergy, unspecified, subsequent encounter: Secondary | ICD-10-CM

## 2016-04-25 DIAGNOSIS — I10 Essential (primary) hypertension: Secondary | ICD-10-CM | POA: Diagnosis not present

## 2016-04-25 DIAGNOSIS — R05 Cough: Secondary | ICD-10-CM | POA: Insufficient documentation

## 2016-04-25 DIAGNOSIS — R059 Cough, unspecified: Secondary | ICD-10-CM | POA: Insufficient documentation

## 2016-04-25 MED ORDER — EPINEPHRINE 0.3 MG/0.3ML IJ SOAJ: 0.3000 mg | Freq: Once | INTRAMUSCULAR | 5 refills | Status: AC

## 2016-04-25 MED ORDER — BENZONATATE 100 MG PO CAPS: 100.0000 mg | ORAL_CAPSULE | Freq: Three times a day (TID) | ORAL | 0 refills | Status: DC | PRN

## 2016-04-25 MED ORDER — AZITHROMYCIN 250 MG PO TABS: ORAL_TABLET | ORAL | 0 refills | Status: DC

## 2016-04-25 MED ORDER — AZELASTINE HCL 0.15 % NA SOLN: 2.0000 | Freq: Two times a day (BID) | NASAL | 0 refills | Status: DC

## 2016-04-25 NOTE — Patient Instructions (Addendum)
   IF you received an x-ray today, you will receive an invoice from [facility]. Please contact [facility] at [phone] with questions or concerns regarding your invoice.   IF you received labwork today, you will receive an invoice from [facility]. Please contact [facility] at [phone] with questions or concerns regarding your invoice.   Our billing staff will not be able to assist you with questions regarding bills from these companies.  You will be contacted with the lab results as soon as they are available. The fastest way to get your results is to activate your My Chart account. Instructions are located on the last page of this paperwork. If you have not heard from us regarding the results in 2 weeks, please contact this office.    Acute Bronchitis Bronchitis is inflammation of the airways that extend from the windpipe into the lungs (bronchi). The inflammation often causes mucus to develop. This leads to a cough, which is the most common symptom of bronchitis.  In acute bronchitis, the condition usually develops suddenly and goes away over time, usually in a couple weeks. Smoking, allergies, and asthma can make bronchitis worse. Repeated episodes of bronchitis may cause further lung problems.  CAUSES Acute bronchitis is most often caused by the same virus that causes a cold. The virus can spread from person to person (contagious) through coughing, sneezing, and touching contaminated objects. SIGNS AND SYMPTOMS   Cough.   Fever.   Coughing up mucus.   Body aches.   Chest congestion.   Chills.   Shortness of breath.   Sore throat.  DIAGNOSIS  Acute bronchitis is usually diagnosed through a physical exam. Your health care provider will also ask you questions about your medical history. Tests, such as chest X-rays, are sometimes done to rule out other conditions.  TREATMENT  Acute bronchitis usually goes away in a  couple weeks. Oftentimes, no medical treatment is necessary. Medicines are sometimes given for relief of fever or cough. Antibiotic medicines are usually not needed but may be prescribed in certain situations. In some cases, an inhaler may be recommended to help reduce shortness of breath and control the cough. A cool mist vaporizer may also be used to help thin bronchial secretions and make it easier to clear the chest.  HOME CARE INSTRUCTIONS  Get plenty of rest.   Drink enough fluids to keep your urine clear or pale yellow (unless you have a medical condition that requires fluid restriction). Increasing fluids may help thin your respiratory secretions (sputum) and reduce chest congestion, and it will prevent dehydration.   Take medicines only as directed by your health care provider.  If you were prescribed an antibiotic medicine, finish it all even if you start to feel better.  Avoid smoking and secondhand smoke. Exposure to cigarette smoke or irritating chemicals will make bronchitis worse. If you are a smoker, consider using nicotine gum or skin patches to help control withdrawal symptoms. Quitting smoking will help your lungs heal faster.   Reduce the chances of another bout of acute bronchitis by washing your hands frequently, avoiding people with cold symptoms, and trying not to touch your hands to your mouth, nose, or eyes.   Keep all follow-up visits as directed by your health care provider.  SEEK MEDICAL CARE IF: Your symptoms do not improve after 1 week of treatment.  SEEK IMMEDIATE MEDICAL CARE IF:  You develop an increased fever or chills.   You have chest pain.     You have severe shortness of breath.  You have bloody sputum.   You develop dehydration.  You faint or repeatedly feel like you are going to pass out.  You develop repeated vomiting.  You develop a severe headache. MAKE SURE YOU:   Understand these instructions.  Will watch your  condition.  Will get help right away if you are not doing well or get worse.   This information is not intended to replace advice given to you by your health care provider. Make sure you discuss any questions you have with your health care provider.   Document Released: 07/11/2004 Document Revised: 06/24/2014 Document Reviewed: 11/24/2012 Elsevier Interactive Patient Education 2016 [employer]  

## 2016-04-25 NOTE — Progress Notes (Signed)
Subjective:    Patient ID: Christina [name], female    DOB: 04-09-1946, 70 y.o.   MRN: [id number] Patient Care Team: Harrison Mons, PA-C as PCP - General (Family Medicine)  Chief Complaint  Patient presents with  . Cough    onset 1 week  . Medication Refill    Epi pen is out of date    HPI: Presents for 6 days of nasal congestion, sneezing, cough with yellow sputum, wheezing, and subjective fevers at home. States she has used her Albuterol inhaler 12 times over the course of the illness which has provided some relief. States her symptoms are worst when getting up in the morning. Has tried Coricidin at home without much relief. Denies chest pain or tightness, night sweats, chills, SOB, abdominal pain, N/V, sinus pain or pressure, constipation or diarrhea. Currently ~1/4 pack/day smoker, previously quit in 2015 but started smoking again recently. Received flu vaccine this year, sick contacts include her grandchildren whom have had bronchiolitis and ear infections.   History of hypertension and hyperlipidemia currently controlled on Norvasc and Lipitor.  Review of Systems  Constitutional: Negative for appetite change, chills, diaphoresis and fatigue.  HENT: Positive for congestion, postnasal drip and sneezing. Negative for ear discharge, ear pain, mouth sores, rhinorrhea, sinus pain, sinus pressure, sore throat and tinnitus.   Eyes: Negative for photophobia, pain, discharge, redness and itching.  Respiratory: Positive for wheezing. Negative for cough, chest tightness, shortness of breath and stridor.   Cardiovascular: Negative for chest pain, palpitations and leg swelling.  Gastrointestinal: Negative for abdominal pain, constipation, diarrhea, nausea and vomiting.  Genitourinary: Negative for difficulty urinating, dysuria, frequency, hematuria and urgency.  Musculoskeletal: Negative for arthralgias, myalgias, neck pain and neck stiffness.  Skin: Negative for rash.  Allergic/Immunologic:  Negative for environmental allergies.  Neurological: Negative for dizziness, weakness, light-headedness and headaches.  Hematological: Negative for adenopathy.  Psychiatric/Behavioral: Negative for dysphoric mood.   Allergies  Allergen Reactions  . Coconut Oil     HIVES  . Codeine Other (See Comments)    Upsets stomach really bad  . Shrimp [Shellfish Allergy] Hives  . Trandolapril Swelling    Swelling of the face/lips 07/2014    Prior to Admission medications   Medication Sig Start Date End Date Taking? Authorizing Provider  albuterol (PROAIR HFA) 108 (90 Base) MCG/ACT inhaler Inhale 2 puffs into the lungs every 6 (six) hours as needed for wheezing or shortness of breath. 03/22/16  Yes Chelle Jeffery, PA-C  amLODipine (NORVASC) 10 MG tablet TAKE 1 TABLET (10 MG) BY MOUTH DAILY 03/22/16  Yes Chelle Jeffery, PA-C  aspirin 81 MG tablet Take 81 mg by mouth daily.   Yes Historical Provider, MD  atorvastatin (LIPITOR) 40 MG tablet Take 1 tablet (40 mg total) by mouth daily. 03/22/16  Yes Chelle Jeffery, PA-C  naproxen (NAPROSYN) 500 MG tablet  02/27/16  Yes Historical Provider, MD  omeprazole (PRILOSEC) 20 MG capsule Take 1 capsule (20 mg total) by mouth daily. 03/22/16  Yes Chelle Jeffery, PA-C  sucralfate (CARAFATE) 1 g tablet TAKE 1 TABLET BY MOUTH 1 HOUR BEFORE MEALS AND AT BEDTIME AS NEEDED 11/08/15  Yes Jaynee Eagles, PA-C  traMADol (ULTRAM) 50 MG tablet Take by mouth. 02/27/16  Yes Historical Provider, MD  Azelastine HCl 0.15 % SOLN Place 2 sprays into both nostrils 2 (two) times daily. 04/25/16   Chelle Jeffery, PA-C  azithromycin (ZITHROMAX) 250 MG tablet Take 2 tabs PO x 1 dose, then 1 tab PO QD x 4  days 04/25/16   Chelle Jeffery, PA-C  benzonatate (TESSALON) 100 MG capsule Take 1-2 capsules (100-200 mg total) by mouth 3 (three) times daily as needed for cough. 04/25/16   Chelle Jeffery, PA-C  EPINEPHrine 0.3 mg/0.3 mL IJ SOAJ injection Inject 0.3 mLs (0.3 mg total) into the muscle once. 04/25/16  04/25/16  Harrison Mons, PA-C   Patient Active Problem List   Diagnosis Date Noted  . Cough 04/25/2016  . 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 Angioedema 08/04/2014  . GERD (gastroesophageal reflux disease) 08/10/2013  . Other and unspecified hyperlipidemia 06/23/2013  . HTN (hypertension) 07/23/2012  . Lipid disorder 07/23/2012        Objective: Blood pressure (!) 154/88, pulse 71, temperature 98.1 F (36.7 C), temperature source Oral, resp. rate 17, height '5\' 8"'$  (1.727 m), weight 177 lb (80.3 kg), SpO2 97 %.   Physical Exam  Constitutional: She is oriented to person, place, and time. She appears well-developed and well-nourished. No distress.  HENT:  Head: Normocephalic and atraumatic.  Right Ear: External ear normal. No swelling or tenderness. Tympanic membrane is not injected, not scarred, not perforated, not erythematous, not retracted and not bulging. No middle ear effusion.  Left Ear: External ear normal. No swelling or tenderness. Tympanic membrane is not injected, not scarred, not perforated, not erythematous, not retracted and not bulging.  No middle ear effusion.  Nose: No mucosal edema, rhinorrhea, sinus tenderness or nasal deformity. No epistaxis. Right sinus exhibits no maxillary sinus tenderness and no frontal sinus tenderness. Left sinus exhibits no maxillary sinus tenderness and no frontal sinus tenderness.  Mouth/Throat: Uvula is midline, oropharynx is clear and moist and mucous membranes are normal. Mucous membranes are not pale, not dry and not cyanotic. No oral lesions. Normal dentition. No dental abscesses. No oropharyngeal exudate, posterior oropharyngeal edema, posterior oropharyngeal erythema or tonsillar abscesses.  Mild erythema of nasal turbinades.   Eyes: Pupils are equal, round, and reactive to light. Right eye exhibits no discharge. Left eye exhibits no discharge. Right conjunctiva is not injected. Left conjunctiva is not injected. No scleral  icterus. Right pupil is round and reactive. Left pupil is round and reactive. Pupils are equal.  Neck: Normal range of motion. Neck supple. No tracheal deviation present. No thyromegaly present.  Cardiovascular: Normal rate, regular rhythm, normal heart sounds and intact distal pulses.  Exam reveals no gallop and no friction rub.   No murmur heard. Pulmonary/Chest: Effort normal. No accessory muscle usage or stridor. No tachypnea and no bradypnea. No respiratory distress. She has no decreased breath sounds. She has wheezes in the right upper field, the right middle field, the right lower field, the left upper field, the left middle field and the left lower field. She has no rhonchi. She has no rales.  End-expiratory wheeze noted in all lung fields.  Lymphadenopathy:    She has no cervical adenopathy.  Neurological: She is alert and oriented to person, place, and time.  Skin: Skin is warm and dry. She is not diaphoretic.  Psychiatric: She has a normal mood and affect. Her behavior is normal.       Assessment & Plan:  1. Cough Azelastine HCl nasal spray and Tessalon perles for nasal congestion and cough relief. Rx Azithromycin 250 mg 2 tabs PO x 1 then 1 tab PO QD x 4 days. Advised rest and increased fluids  - Azelastine HCl 0.15 % SOLN; Place 2 sprays into both nostrils 2 (two) times daily.  Dispense: 30 mL; Refill: 0 -  azithromycin (ZITHROMAX) 250 MG tablet; Take 2 tabs PO x 1 dose, then 1 tab PO QD x 4 days  Dispense: 6 tablet; Refill: 0 - benzonatate (TESSALON) 100 MG capsule; Take 1-2 capsules (100-200 mg total) by mouth 3 (three) times daily as needed for cough.  Dispense: 40 capsule; Refill: 0  2. Allergic reaction, subsequent encounter History of allergic reaction with hives to shellfish and coconut oil and recently ran out of prescription. - EPINEPHrine 0.3 mg/0.3 mL IJ SOAJ injection; Inject 0.3 mLs (0.3 mg total) into the muscle once.  Dispense: 1 Device; Refill: 5  3. Essential  hypertension Elevated blood pressure today (154/88 mmHg) likely due to illness. States she took her Norvasc this morning. Visit 1 month ago for HTN/HLD follow-up with BP measurement of 122/70 mmHg. Advised f/u in 4 months for re-evaluation.

## 2016-04-25 NOTE — Progress Notes (Signed)
Patient ID: Christina [name], female    DOB: 1945-06-27, 70 y.o.   MRN: [id number]  PCP: Harrison Mons, PA-C  Chief Complaint  Patient presents with  . Cough    onset 1 week  . Medication Refill    Epi pen is out of date    Subjective:    HPI Presents for evaluation of cough x 6 days.  She is a smoker, trying to quit. Relates cough producing yellow sputum, congestion, sneezing, wheezing and fever. Symptoms are worst when she first gets up in the morning. She has tried OTC Coricidin, but without benefit.  Albuterol helps when she uses it, about 12 times in the past week.  Has received seasonal influenza vaccine.  Grandchildren have been ill recently-bronchiolitis and AOM.    Review of Systems Constitutional: Negative for appetite change, chills, diaphoresis and fatigue.  HENT: Positive for congestion, postnasal drip and sneezing. Negative for ear discharge, ear pain, mouth sores, rhinorrhea, sinus pain, sinus pressure, sore throat and tinnitus.   Eyes: Negative for photophobia, pain, discharge, redness and itching.  Respiratory: Positive for wheezing. Negative for cough, chest tightness, shortness of breath and stridor.   Cardiovascular: Negative for chest pain, palpitations and leg swelling.  Gastrointestinal: Negative for abdominal pain, constipation, diarrhea, nausea and vomiting.  Genitourinary: Negative for difficulty urinating, dysuria, frequency, hematuria and urgency.  Musculoskeletal: Negative for arthralgias, myalgias, neck pain and neck stiffness.  Skin: Negative for rash.  Allergic/Immunologic: Negative for environmental allergies.  Neurological: Negative for dizziness, weakness, light-headedness and headaches.  Hematological: Negative for adenopathy.  Psychiatric/Behavioral: Negative for dysphoric mood.     Patient Active Problem List   Diagnosis Date Noted  . 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 Angioedema 08/04/2014  . GERD  (gastroesophageal reflux disease) 08/10/2013  . Other and unspecified hyperlipidemia 06/23/2013  . HTN (hypertension) 07/23/2012  . Lipid disorder 07/23/2012     Prior to Admission medications   Medication Sig Start Date End Date Taking? Authorizing Provider  albuterol (PROAIR HFA) 108 (90 Base) MCG/ACT inhaler Inhale 2 puffs into the lungs every 6 (six) hours as needed for wheezing or shortness of breath. 03/22/16  Yes Joey Lierman, PA-C  amLODipine (NORVASC) 10 MG tablet TAKE 1 TABLET (10 MG) BY MOUTH DAILY 03/22/16  Yes Tallula Grindle, PA-C  aspirin 81 MG tablet Take 81 mg by mouth daily.   Yes Historical Provider, MD  atorvastatin (LIPITOR) 40 MG tablet Take 1 tablet (40 mg total) by mouth daily. 03/22/16  Yes Makynzie Dobesh, PA-C  EPINEPHrine (EPIPEN) 0.3 mg/0.3 mL DEVI Inject 0.3 mLs (0.3 mg total) into the muscle once. 11/16/11  Yes Darlyne Russian, MD  naproxen (NAPROSYN) 500 MG tablet  02/27/16  Yes Historical Provider, MD  omeprazole (PRILOSEC) 20 MG capsule Take 1 capsule (20 mg total) by mouth daily. 03/22/16  Yes Victorious Kundinger, PA-C  sucralfate (CARAFATE) 1 g tablet TAKE 1 TABLET BY MOUTH 1 HOUR BEFORE MEALS AND AT BEDTIME AS NEEDED 11/08/15  Yes Jaynee Eagles, PA-C  traMADol (ULTRAM) 50 MG tablet Take by mouth. 02/27/16  Yes Historical Provider, MD     Allergies  Allergen Reactions  . Coconut Oil     HIVES  . Codeine Other (See Comments)    Upsets stomach really bad  . Trandolapril Swelling    Swelling of the face/lips 07/2014       Objective:  Physical Exam  Constitutional: She is oriented to person, place, and time. She appears well-developed and well-nourished. She  is active and cooperative. No distress.  BP (!) 154/88 (BP Location: Right Arm, Patient Position: Sitting, Cuff Size: Normal)   Pulse 71   Temp 98.1 F (36.7 C) (Oral)   Resp 17   Ht '5\' 8"'$  (1.727 m)   Wt 177 lb (80.3 kg)   SpO2 97%   BMI 26.91 kg/m   HENT:  Head: Normocephalic and atraumatic.  Right Ear:  Hearing, tympanic membrane, external ear and ear canal normal.  Left Ear: Hearing, tympanic membrane, external ear and ear canal normal.  Nose: Mucosal edema (minimal) present.  Mouth/Throat: Oropharynx is clear and moist and mucous membranes are normal.  Eyes: Conjunctivae are normal. No scleral icterus.  Neck: Normal range of motion, full passive range of motion without pain and phonation normal. Neck supple. No thyromegaly present.  Cardiovascular: Normal rate, regular rhythm and normal heart sounds.   Pulses:      Radial pulses are 2+ on the right side, and 2+ on the left side.  Pulmonary/Chest: Effort normal and breath sounds normal. She has no decreased breath sounds. Wheezes: one end-expiratory wheeze RUL, resolved  She has no rhonchi. She has no rales.  Lymphadenopathy:       Head (right side): No tonsillar, no preauricular, no posterior auricular and no occipital adenopathy present.       Head (left side): No tonsillar, no preauricular, no posterior auricular and no occipital adenopathy present.    She has no cervical adenopathy.       Right: No supraclavicular adenopathy present.       Left: No supraclavicular adenopathy present.  Neurological: She is alert and oriented to person, place, and time. No sensory deficit.  Skin: Skin is warm, dry and intact. No rash noted. No cyanosis or erythema. Nails show no clubbing.  Psychiatric: She has a normal mood and affect. Her speech is normal and behavior is normal.           Assessment & Plan:   1. Cough Bronchitis. As a smoker, she is at higher risk for bacterial cause, so cover with azithromycin. If symptoms persist, RTC for re-evaluation with CXR. - Azelastine HCl 0.15 % SOLN; Place 2 sprays into both nostrils 2 (two) times daily.  Dispense: 30 mL; Refill: 0 - azithromycin (ZITHROMAX) 250 MG tablet; Take 2 tabs PO x 1 dose, then 1 tab PO QD x 4 days  Dispense: 6 tablet; Refill: 0 - benzonatate (TESSALON) 100 MG capsule; Take 1-2  capsules (100-200 mg total) by mouth 3 (three) times daily as needed for cough.  Dispense: 40 capsule; Refill: 0  2. Allergic reaction, subsequent encounter - EPINEPHrine 0.3 mg/0.3 mL IJ SOAJ injection; Inject 0.3 mLs (0.3 mg total) into the muscle once.  Dispense: 1 Device; Refill: 5  3. Essential hypertension Usually controlled. Likely elevated today due to illness.   Fara Chute, PA-C Physician Assistant-Certified [facility]

## 2016-06-13 ENCOUNTER — Other Ambulatory Visit: Payer: Self-pay | Admitting: Physician Assistant

## 2016-06-13 DIAGNOSIS — I10 Essential (primary) hypertension: Secondary | ICD-10-CM

## 2016-06-25 ENCOUNTER — Other Ambulatory Visit: Payer: Self-pay | Admitting: Physician Assistant

## 2016-06-25 DIAGNOSIS — Z1231 Encounter for screening mammogram for malignant neoplasm of breast: Secondary | ICD-10-CM

## 2016-07-02 ENCOUNTER — Other Ambulatory Visit: Payer: Self-pay | Admitting: Gastroenterology

## 2016-07-05 ENCOUNTER — Other Ambulatory Visit: Payer: Self-pay

## 2016-07-05 MED ORDER — OMEPRAZOLE 40 MG PO CPDR: DELAYED_RELEASE_CAPSULE | ORAL | 0 refills | Status: DC

## 2016-07-11 ENCOUNTER — Ambulatory Visit
Admission: RE | Admit: 2016-07-11 | Discharge: 2016-07-11 | Disposition: A | Discharge status: Home / Self Care | Payer: BLUE CROSS/BLUE SHIELD | Source: Ambulatory Visit | Attending: Physician Assistant | Admitting: Physician Assistant

## 2016-07-11 DIAGNOSIS — Z1231 Encounter for screening mammogram for malignant neoplasm of breast: Secondary | ICD-10-CM

## 2016-07-26 ENCOUNTER — Other Ambulatory Visit: Payer: Self-pay | Admitting: Urgent Care

## 2016-08-06 ENCOUNTER — Other Ambulatory Visit: Payer: Self-pay | Admitting: Physician Assistant

## 2016-08-06 DIAGNOSIS — E785 Hyperlipidemia, unspecified: Secondary | ICD-10-CM

## 2016-08-06 NOTE — Telephone Encounter (Signed)
Please schedule appt with Chelle for the patient in March for her f/u for chronic medical problems.

## 2016-08-06 NOTE — Telephone Encounter (Signed)
My Chart message sent to patient to make her an appointment per Judson Roch

## 2016-09-27 ENCOUNTER — Ambulatory Visit (INDEPENDENT_AMBULATORY_CARE_PROVIDER_SITE_OTHER): Payer: BLUE CROSS/BLUE SHIELD | Admitting: Physician Assistant

## 2016-09-27 VITALS — BP 138/77 | HR 79 | Temp 97.5°F | Resp 18 | Ht 68.0 in | Wt 173.0 lb

## 2016-09-27 DIAGNOSIS — F17200 Nicotine dependence, unspecified, uncomplicated: Secondary | ICD-10-CM | POA: Diagnosis not present

## 2016-09-27 DIAGNOSIS — Z87891 Personal history of nicotine dependence: Secondary | ICD-10-CM | POA: Insufficient documentation

## 2016-09-27 DIAGNOSIS — J019 Acute sinusitis, unspecified: Secondary | ICD-10-CM

## 2016-09-27 MED ORDER — AMOXICILLIN-POT CLAVULANATE 875-125 MG PO TABS: 1.0000 | ORAL_TABLET | Freq: Two times a day (BID) | ORAL | 0 refills | Status: DC

## 2016-09-27 MED ORDER — AZELASTINE HCL 0.15 % NA SOLN: 2.0000 | Freq: Two times a day (BID) | NASAL | 0 refills | Status: DC

## 2016-09-27 MED ORDER — GUAIFENESIN ER 1200 MG PO TB12: 1.0000 | ORAL_TABLET | Freq: Two times a day (BID) | ORAL | 1 refills | Status: DC | PRN

## 2016-09-27 MED ORDER — BENZONATATE 100 MG PO CAPS: 100.0000 mg | ORAL_CAPSULE | Freq: Three times a day (TID) | ORAL | 0 refills | Status: DC | PRN

## 2016-09-27 NOTE — Progress Notes (Signed)
Patient ID: Christina [name], female    DOB: 1946-03-04, 71 y.o.   MRN: [id number]  PCP: Harrison Mons, PA-C  Chief Complaint  Patient presents with  . Cough  . Nasal Congestion    Subjective:   Presents for evaluation of cough, nasal congestion, drainage, sinus pressure x 2 weeks.  Some sneezing. No runny nose, sore throat, itchy/watery eyes or ear pain. No HA, dizziness, SOB, CP. Her husband has been ill, with a sore throat, and her grandchildren have been coughing.  Last week she had some chills, but no other symptoms. 2 weeks ago she had two episodes of diarrhea, but no other symptoms..    Review of Systems As above.    Patient Active Problem List   Diagnosis Date Noted  . Cough 04/25/2016  . 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 Angioedema 08/04/2014  . GERD (gastroesophageal reflux disease) 08/10/2013  . Other and unspecified hyperlipidemia 06/23/2013  . HTN (hypertension) 07/23/2012  . Lipid disorder 07/23/2012     Prior to Admission medications   Medication Sig Start Date End Date Taking? Authorizing Provider  albuterol (PROAIR HFA) 108 (90 Base) MCG/ACT inhaler Inhale 2 puffs into the lungs every 6 (six) hours as needed for wheezing or shortness of breath. 03/22/16  Yes Raela Bohl, PA-C  amLODipine (NORVASC) 10 MG tablet TAKE 1 TABLET BY MOUTH DAILY 06/13/16  Yes Leila Schuff, PA-C  aspirin 81 MG tablet Take 81 mg by mouth daily.   Yes Historical Provider, MD  atorvastatin (LIPITOR) 40 MG tablet TAKE 1 TABLET(40 MG) BY MOUTH DAILY 08/06/16  Yes Mancel Bale, PA-C  naproxen (NAPROSYN) 500 MG tablet  02/27/16  Yes Historical Provider, MD  omeprazole (PRILOSEC) 40 MG capsule TAKE 1 CAPSULE(40 MG) BY MOUTH DAILY 07/05/16  Yes Ladene Artist, MD  sucralfate (CARAFATE) 1 g tablet TAKE 1 TABLET BY MOUTH 1 HOUR BEFORE MEALS AND AT BEDTIME AS NEEDED 07/27/16  Yes Aamirah Salmi, PA-C  traMADol (ULTRAM) 50 MG tablet Take by mouth. 02/27/16  Yes  Historical Provider, MD     Allergies  Allergen Reactions  . Coconut Oil     HIVES  . Codeine Other (See Comments)    Upsets stomach really bad  . Shrimp [Shellfish Allergy] Hives  . Trandolapril Swelling    Swelling of the face/lips 07/2014       Objective:  Physical Exam  Constitutional: She is oriented to person, place, and time. She appears well-developed and well-nourished. No distress.  BP 138/77   Pulse 79   Temp 97.5 F (36.4 C) (Oral)   Resp 18   Ht '5\' 8"'$  (1.727 m)   Wt 173 lb (78.5 kg)   SpO2 98%   BMI 26.30 kg/m    HENT:  Head: Normocephalic and atraumatic.  Right Ear: Hearing, tympanic membrane, external ear and ear canal normal.  Left Ear: Hearing, tympanic membrane, external ear and ear canal normal.  Nose: Mucosal edema and rhinorrhea present.  No foreign bodies. Right sinus exhibits no maxillary sinus tenderness and no frontal sinus tenderness. Left sinus exhibits no maxillary sinus tenderness and no frontal sinus tenderness.  Mouth/Throat: Uvula is midline, oropharynx is clear and moist and mucous membranes are normal. No uvula swelling. No oropharyngeal exudate.  Eyes: Conjunctivae and EOM are normal. Pupils are equal, round, and reactive to light. Right eye exhibits no discharge. Left eye exhibits no discharge. No scleral icterus.  Neck: Trachea normal, normal range of motion and full passive  range of motion without pain. Neck supple. No thyroid mass and no thyromegaly present.  Cardiovascular: Normal rate, regular rhythm and normal heart sounds.   Pulmonary/Chest: Effort normal and breath sounds normal.  Lymphadenopathy:       Head (right side): No submandibular, no tonsillar, no preauricular, no posterior auricular and no occipital adenopathy present.       Head (left side): No submandibular, no tonsillar, no preauricular and no occipital adenopathy present.    She has no cervical adenopathy.       Right: No supraclavicular adenopathy present.        Left: No supraclavicular adenopathy present.  Neurological: She is alert and oriented to person, place, and time. She has normal strength. No cranial nerve deficit or sensory deficit.  Skin: Skin is warm, dry and intact. No rash noted.  Psychiatric: She has a normal mood and affect. Her speech is normal and behavior is normal.           Assessment & Plan:   Problem List Items Addressed This Visit    Smoker    Encouraged smoking cessation. Not interested in help presently, believing that she should be able to do it alone. Husband no longer smokes. She'll let me know if she decides she'd like help.       Other Visit Diagnoses    Acute non-recurrent sinusitis, unspecified location    -  Primary   Supportive care. Treat for bacterial etiology. Encouraged smoking cessation.   Relevant Medications   amoxicillin-clavulanate (AUGMENTIN) 875-125 MG tablet   benzonatate (TESSALON) 100 MG capsule   Azelastine HCl 0.15 % SOLN   Guaifenesin (MUCINEX MAXIMUM STRENGTH) 1200 MG TB12       Return if symptoms worsen or fail to improve.   Fara Chute, PA-C [facility]

## 2016-09-27 NOTE — Patient Instructions (Signed)
     IF you received an x-ray today, you will receive an invoice from [facility]. Please contact [facility] at [phone] with questions or concerns regarding your invoice.   IF you received labwork today, you will receive an invoice from [facility]. Please contact [facility] at [phone] with questions or concerns regarding your invoice.   Our billing staff will not be able to assist you with questions regarding bills from these companies.  You will be contacted with the lab results as soon as they are available. The fastest way to get your results is to activate your My Chart account. Instructions are located on the last page of this paperwork. If you have not heard from us regarding the results in 2 weeks, please contact this office.     

## 2016-09-27 NOTE — Assessment & Plan Note (Signed)
Encouraged smoking cessation. Not interested in help presently, believing that she should be able to do it alone. Husband no longer smokes. She'll let me know if she decides she'd like help.

## 2016-09-27 NOTE — Progress Notes (Signed)
THIS NOTE IS USED FOR EDUCATIONAL PURPOSES ONLY!!!   Name: Christina [name]  DOB: [birth year]  Age: 71 y.o. Sex: female  CC:  Chief Complaint  Patient presents with  . Cough  . Nasal Congestion    PCP: Harrison Mons, PA-C  HPI: Patient reports congestion and cough x2 weeks.   Cough is non-productive. Patient reports congestion as well. Denies rhinorrhea, itchy eyes, watery eyes. Intermittent sneezing. Denies sore throat. She reports taking corciden OTC which she reports helps a little.   Last week intermittent chills. Denies fevers. Denies N/V/C. x2 weeks ago she had x2 bouts of diarrhea for 2 days. Denies ear pain.   Patient reports her husband has been sick with a sore throat. She also reports her grandchildren have been coughing as well.   ROS:  Constitutional: Negative for activity change, appetite change, fatigue and unexpected weight change.  HENT: Negative for congestion, dental problem, ear pain, hearing loss, mouth sores, postnasal drip, rhinorrhea, sneezing, sore throat, tinnitus and trouble swallowing.   Eyes: Negative for photophobia, pain, redness and visual disturbance.  Respiratory: Negative for cough, chest tightness and shortness of breath.   Cardiovascular: Negative for chest pain, palpitations and leg swelling.  Gastrointestinal: Negative for abdominal pain, blood in stool, constipation, diarrhea, nausea and vomiting.  Genitourinary: Negative for dysuria, frequency, hematuria and urgency.  Musculoskeletal: Negative for arthralgias, gait problem, myalgias and neck stiffness.  Skin: Negative for rash.  Neurological: Negative for dizziness, speech difficulty, weakness, light-headedness, numbness and headaches.  Hematological: Negative for adenopathy.  Psychiatric/Behavioral: Negative for confusion and sleep disturbance. The patient is not nervous/anxious.    PMH:  Patient Active Problem List   Diagnosis Date Noted  . Cough 04/25/2016  . ACE inhibitor-aggravated  angioedema 08/04/2014  . Angioedema 08/04/2014  . GERD (gastroesophageal reflux disease) 08/10/2013  . Other and unspecified hyperlipidemia 06/23/2013  . HTN (hypertension) 07/23/2012  . Lipid disorder 07/23/2012    Allergies:  Allergies  Allergen Reactions  . Coconut Oil     HIVES  . Codeine Other (See Comments)    Upsets stomach really bad  . Shrimp [Shellfish Allergy] Hives  . Trandolapril Swelling    Swelling of the face/lips 07/2014    Medications:  Current Outpatient Prescriptions on File Prior to Visit  Medication Sig Dispense Refill  . albuterol (PROAIR HFA) 108 (90 Base) MCG/ACT inhaler Inhale 2 puffs into the lungs every 6 (six) hours as needed for wheezing or shortness of breath. 8.5 g 1  . amLODipine (NORVASC) 10 MG tablet TAKE 1 TABLET BY MOUTH DAILY 90 tablet 1  . aspirin 81 MG tablet Take 81 mg by mouth daily.    Marland Kitchen atorvastatin (LIPITOR) 40 MG tablet TAKE 1 TABLET(40 MG) BY MOUTH DAILY 90 tablet 0  . Azelastine HCl 0.15 % SOLN Place 2 sprays into both nostrils 2 (two) times daily. 30 mL 0  . naproxen (NAPROSYN) 500 MG tablet   0  . omeprazole (PRILOSEC) 40 MG capsule TAKE 1 CAPSULE(40 MG) BY MOUTH DAILY 90 capsule 0  . sucralfate (CARAFATE) 1 g tablet TAKE 1 TABLET BY MOUTH 1 HOUR BEFORE MEALS AND AT BEDTIME AS NEEDED 120 tablet 0  . traMADol (ULTRAM) 50 MG tablet Take by mouth.     No current facility-administered medications on file prior to visit.     PE:  GS: WDWN female sitting on exam table in NAD.  Vitals: BP 138/77   Pulse 79   Temp 97.5 F (36.4 C) (Oral)  Resp 18   Ht '5\' 8"'$  (1.727 m)   Wt 173 lb (78.5 kg)   SpO2 98%   BMI 26.30 kg/m  HEENT: Normocephalic, atruamatic. PEARRL. No cervical lymphadenopathy. No thyroid nodules, normal size, and equal bilaterally. Ears: Bilateral ears without erythema, TM clear with good coen of light. TM without bulging. Nose: Patent, without erythema or discharge. Mouth/Throat: Moist mucous membranes. No erythema.  Tonsils without erythema or tonsillar exudate.  Cardiovascular: RRR. No S3 or S4. No murmurs, rubs, or gallops. Pulses 2+ and equal bilateral in the upper and lower extremities. No pitting edema. No varicosities, clubbing, or cyanosis.  Pulm: CTA bilaterally. No expiratory muscle use while breathing.  GI: +BS. NTND. No rigidity or guarding. No rebound tenderness.  MSK:  Neuro: CN 2-12 grossly intact.  Psych: A&O x 4. Mood and affect appropriate for situation.  Skin: Warm and dry. No rashes or excoriations on exposed skin.   A&P:        Respectfully,  Delilah Shan, PA-S2

## 2016-10-05 ENCOUNTER — Other Ambulatory Visit: Payer: Self-pay | Admitting: Gastroenterology

## 2016-10-24 ENCOUNTER — Other Ambulatory Visit: Payer: Self-pay | Admitting: Physician Assistant

## 2016-10-24 DIAGNOSIS — R062 Wheezing: Secondary | ICD-10-CM

## 2016-11-16 ENCOUNTER — Ambulatory Visit: Payer: BLUE CROSS/BLUE SHIELD | Admitting: Family Medicine

## 2016-12-07 ENCOUNTER — Ambulatory Visit (INDEPENDENT_AMBULATORY_CARE_PROVIDER_SITE_OTHER): Payer: BLUE CROSS/BLUE SHIELD | Admitting: Physician Assistant

## 2016-12-07 ENCOUNTER — Encounter: Payer: Self-pay | Admitting: Physician Assistant

## 2016-12-07 ENCOUNTER — Ambulatory Visit (INDEPENDENT_AMBULATORY_CARE_PROVIDER_SITE_OTHER): Payer: BLUE CROSS/BLUE SHIELD

## 2016-12-07 VITALS — BP 130/88 | HR 81 | Temp 98.1°F | Resp 16 | Ht 67.0 in | Wt 169.4 lb

## 2016-12-07 DIAGNOSIS — E785 Hyperlipidemia, unspecified: Secondary | ICD-10-CM

## 2016-12-07 DIAGNOSIS — M25551 Pain in right hip: Secondary | ICD-10-CM

## 2016-12-07 DIAGNOSIS — E789 Disorder of lipoprotein metabolism, unspecified: Secondary | ICD-10-CM | POA: Diagnosis not present

## 2016-12-07 DIAGNOSIS — I10 Essential (primary) hypertension: Secondary | ICD-10-CM | POA: Diagnosis not present

## 2016-12-07 DIAGNOSIS — K219 Gastro-esophageal reflux disease without esophagitis: Secondary | ICD-10-CM

## 2016-12-07 DIAGNOSIS — Z79899 Other long term (current) drug therapy: Secondary | ICD-10-CM

## 2016-12-07 DIAGNOSIS — R062 Wheezing: Secondary | ICD-10-CM

## 2016-12-07 MED ORDER — MELOXICAM 15 MG PO TABS: 15.0000 mg | ORAL_TABLET | Freq: Every day | ORAL | 1 refills | Status: DC

## 2016-12-07 MED ORDER — ATORVASTATIN CALCIUM 40 MG PO TABS: ORAL_TABLET | ORAL | 3 refills | Status: DC

## 2016-12-07 MED ORDER — SUCRALFATE 1 G PO TABS: ORAL_TABLET | ORAL | 1 refills | Status: DC

## 2016-12-07 MED ORDER — ALBUTEROL SULFATE HFA 108 (90 BASE) MCG/ACT IN AERS: INHALATION_SPRAY | RESPIRATORY_TRACT | 0 refills | Status: DC

## 2016-12-07 MED ORDER — AMLODIPINE BESYLATE 10 MG PO TABS: 10.0000 mg | ORAL_TABLET | Freq: Every day | ORAL | 4 refills | Status: DC

## 2016-12-07 NOTE — Patient Instructions (Addendum)
You will receive a phone call to schedule an appointment with orthopedics.  Meloxicam - Do not use with any other otc pain medication other than tylenol/acetaminophen - so no aleve, ibuprofen, motrin, advil, etc. Take one pill daily for pain.    Thank you for coming in today. I hope you feel we met your needs.  Feel free to call [facility] if you have any questions or further requests.  Please consider signing up for MyChart if you do not already have it, as this is a great way to communicate with me.  Best,  Whitney McVey, PA-C    Meloxicam tablets What is this medicine? MELOXICAM (mel OX i cam) is a non-steroidal anti-inflammatory drug (NSAID). It is used to reduce swelling and to treat pain. It may be used for osteoarthritis, rheumatoid arthritis, or juvenile rheumatoid arthritis. This medicine may be used for other purposes; ask your health care provider or pharmacist if you have questions. COMMON BRAND NAME(S): Mobic What should I tell my health care provider before I take this medicine? They need to know if you have any of these conditions: -bleeding disorders -cigarette smoker -coronary artery bypass graft (CABG) surgery within the past 2 weeks -drink more than 3 alcohol-containing drinks per day -heart disease -high blood pressure -history of stomach bleeding -kidney disease -liver disease -lung or breathing disease, like asthma -stomach or intestine problems -an unusual or allergic reaction to meloxicam, aspirin, other NSAIDs, other medicines, foods, dyes, or preservatives -pregnant or trying to get pregnant -breast-feeding How should I use this medicine? Take this medicine by mouth with a full glass of water. Follow the directions on the prescription label. You can take it with or without food. If it upsets your stomach, take it with food. Take your medicine at regular intervals. Do not take it more often than directed. Do not stop taking except on your doctor's advice. A  special MedGuide will be given to you by the pharmacist with each prescription and refill. Be sure to read this information carefully each time. Talk to your pediatrician regarding the use of this medicine in children. While this drug may be prescribed for selected conditions, precautions do apply. Patients over 61 years old may have a stronger reaction and need a smaller dose. Overdosage: If you think you have taken too much of this medicine contact a poison control center or emergency room at once. NOTE: This medicine is only for you. Do not share this medicine with others. What if I miss a dose? If you miss a dose, take it as soon as you can. If it is almost time for your next dose, take only that dose. Do not take double or extra doses. What may interact with this medicine? Do not take this medicine with any of the following medications: -cidofovir -ketorolac This medicine may also interact with the following medications: -aspirin and aspirin-like medicines -certain medicines for blood pressure, heart disease, irregular heart beat -certain medicines for depression, anxiety, or psychotic disturbances -certain medicines that treat or prevent blood clots like warfarin, enoxaparin, dalteparin, apixaban, dabigatran, rivaroxaban -cyclosporine -digoxin -diuretics -methotrexate -other NSAIDs, medicines for pain and inflammation, like ibuprofen and naproxen -pemetrexed This list may not describe all possible interactions. Give your health care provider a list of all the medicines, herbs, non-prescription drugs, or dietary supplements you use. Also tell them if you smoke, drink alcohol, or use illegal drugs. Some items may interact with your medicine. What should I watch for while using this medicine? Tell  your doctor or healthcare professional if your symptoms do not start to get better or if they get worse. Do not take other medicines that contain aspirin, ibuprofen, or naproxen with this  medicine. Side effects such as stomach upset, nausea, or ulcers may be more likely to occur. Many medicines available without a prescription should not be taken with this medicine. This medicine can cause ulcers and bleeding in the stomach and intestines at any time during treatment. This can happen with no warning and may cause death. There is increased risk with taking this medicine for a long time. Smoking, drinking alcohol, older age, and poor health can also increase risks. Call your doctor right away if you have stomach pain or blood in your vomit or stool. This medicine does not prevent heart attack or stroke. In fact, this medicine may increase the chance of a heart attack or stroke. The chance may increase with longer use of this medicine and in people who have heart disease. If you take aspirin to prevent heart attack or stroke, talk with your doctor or health care professional. What side effects may I notice from receiving this medicine? Side effects that you should report to your doctor or health care professional as soon as possible: -allergic reactions like skin rash, itching or hives, swelling of the face, lips, or tongue -nausea, vomiting -signs and symptoms of a blood clot such as breathing problems; changes in vision; chest pain; severe, sudden headache; pain, swelling, warmth in the leg; trouble speaking; sudden numbness or weakness of the face, arm, or leg -signs and symptoms of bleeding such as bloody or black, tarry stools; red or dark-brown urine; spitting up blood or brown material that looks like coffee grounds; red spots on the skin; unusual bruising or bleeding from the eye, gums, or nose -signs and symptoms of liver injury like dark yellow or brown urine; general ill feeling or flu-like symptoms; light-colored stools; loss of appetite; nausea; right upper belly pain; unusually weak or tired; yellowing of the eyes or skin -signs and symptoms of stroke like changes in vision;  confusion; trouble speaking or understanding; severe headaches; sudden numbness or weakness of the face, arm, or leg; trouble walking; dizziness; loss of balance or coordination Side effects that usually do not require medical attention (report to your doctor or health care professional if they continue or are bothersome): -constipation -diarrhea -gas This list may not describe all possible side effects. Call your doctor for medical advice about side effects. You may report side effects to FDA at 1-800-FDA-1088. Where should I keep my medicine? Keep out of the reach of children. Store at room temperature between 15 and 30 degrees C (59 and 86 degrees F). Throw away any unused medicine after the expiration date. NOTE: This sheet is a summary. It may not cover all possible information. If you have questions about this medicine, talk to your doctor, pharmacist, or health care provider.  2018 Elsevier/Gold Standard (2015-07-05 19:28:16)  IF you received an x-ray today, you will receive an invoice from [facility]. Please contact [facility] at [phone] with questions or concerns regarding your invoice.   IF you received labwork today, you will receive an invoice from [facility]. Please contact [facility] at [phone] with questions or concerns regarding your invoice.   Our billing staff will not be able to assist you with questions regarding bills from these companies.  You will be contacted with the lab results as soon as they are available. The fastest way  to get your results is to activate your My Chart account. Instructions are located on the last page of this paperwork. If you have not heard from [place] regarding the results in 2 weeks, please contact this office.

## 2016-12-07 NOTE — Progress Notes (Signed)
Christina [name]  MRN: [id number] DOB: Jun 10, 1946  PCP: Harrison Mons, PA-C  Subjective:  Pt is a 71 year old female PMH HTN, GERD, dyslipidemia, who presents to clinic for right hip pain x 1 week.   8/10 pain. Hurts worse with bending forward and laying on it. Occasionally radiates to right side of her low back. No known MOI - she believes she woke up with the pain. She has had this before "a while ago." She took tramadol this morning - helped.  Denies warmth, swelling, decreased ROM, leg length discrepancy, fever, chills.   She needs medication refill for blood pressure, lipids, GERD and asthma.   Review of Systems  Respiratory: Negative for cough, shortness of breath and wheezing.   Cardiovascular: Negative for chest pain and palpitations.  Musculoskeletal: Positive for arthralgias (right hip). Negative for back pain and joint swelling.  Skin: Negative.     Patient Active Problem List   Diagnosis Date Noted  . Smoker 09/27/2016  . Cough 04/25/2016  . 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 Angioedema 08/04/2014  . GERD (gastroesophageal reflux disease) 08/10/2013  . Other and unspecified hyperlipidemia 06/23/2013  . HTN (hypertension) 07/23/2012  . Lipid disorder 07/23/2012    Current Outpatient Prescriptions on File Prior to Visit  Medication Sig Dispense Refill  . amLODipine (NORVASC) 10 MG tablet TAKE 1 TABLET BY MOUTH DAILY 90 tablet 1  . aspirin 81 MG tablet Take 81 mg by mouth daily.    Marland Kitchen atorvastatin (LIPITOR) 40 MG tablet TAKE 1 TABLET(40 MG) BY MOUTH DAILY 90 tablet 0  . Azelastine HCl 0.15 % SOLN Place 2 sprays into both nostrils 2 (two) times daily. 30 mL 0  . omeprazole (PRILOSEC) 40 MG capsule TAKE ONE CAPSULE BY MOUTH DAILY 90 capsule 0  . PROAIR HFA 108 (90 Base) MCG/ACT inhaler INHALE 2 PUFFS INTO THE LUNGS EVERY 6 HOURS AS NEEDED FOR WHEEZING OR SHORTNESS OF BREATH 8.5 g 0  . sucralfate (CARAFATE) 1 g tablet TAKE 1 TABLET BY MOUTH 1 HOUR  BEFORE MEALS AND AT BEDTIME AS NEEDED 120 tablet 0   No current facility-administered medications on file prior to visit.     Allergies  Allergen Reactions  . Coconut Oil     HIVES  . Codeine Other (See Comments)    Upsets stomach really bad  . Shrimp [Shellfish Allergy] Hives  . Trandolapril Swelling    Swelling of the face/lips 07/2014     Objective:  BP 130/88   Pulse 81   Temp 98.1 F (36.7 C) (Oral)   Resp 16   Ht _0  (1.702 m)   Wt 169 lb 6.4 oz (76.8 kg)   SpO2 98%   BMI 26.53 kg/m   Physical Exam  Constitutional: She is oriented to person, place, and time and well-developed, well-nourished, and in no distress. No distress.  Cardiovascular: Normal rate, regular rhythm and normal heart sounds.   Musculoskeletal:       Right hip: She exhibits tenderness (greater trochanter). She exhibits normal range of motion, normal strength, no swelling, no crepitus and no deformity.  Neurological: She is alert and oriented to person, place, and time. GCS score is 15.  Skin: Skin is warm and dry.  Psychiatric: Mood, memory, affect and judgment normal.  Vitals reviewed.   Dg Hip Unilat W Or W/o Pelvis 2-3 Views Right  Result Date: 12/07/2016 CLINICAL DATA:  71 year old female with a history of right hip pain for 1 week EXAM: DG  HIP (WITH OR WITHOUT PELVIS) 2-3V RIGHT COMPARISON:  None. FINDINGS: Bony pelvic ring intact.  No acute fracture line identified. Bilateral hips projects normally over the acetabula. Unremarkable appearance of the proximal femurs. Calcifications of the vasculature. Degenerative changes of the lumbar spine. IMPRESSION: Negative for acute bony abnormality. Electronically Signed   By: Corrie Mckusick D.O.   On: 12/07/2016 12:14    Assessment and Plan :  1. Pain of right hip joint - DG HIP UNILAT W OR W/O PELVIS 2-3 VIEWS RIGHT; Future - Ambulatory referral to Orthopedic Surgery - meloxicam (MOBIC) 15 MG tablet; Take 1 tablet (15 mg total) by mouth daily.   Dispense: 30 tablet; Refill: 1 - X-ray is negative. Suspect possible bursitis. Plan to treat with Mobic, refer to ortho if she does not improve. She agrees with plan.  2. Encounter for medication management 3. Essential hypertension - amLODipine (NORVASC) 10 MG tablet; Take 1 tablet (10 mg total) by mouth daily.  Dispense: 90 tablet; Refill: 4 - CMP14+EGFR - Blood pressure controlled. Lab pending.  4. Hyperlipidemia, unspecified hyperlipidemia type - atorvastatin (LIPITOR) 40 MG tablet; TAKE 1 TABLET(40 MG) BY MOUTH DAILY  Dispense: 90 tablet; Refill: 3 - Lipid panel - Lab pending. Will make medication adjustment if needed.  5. Expiratory wheezing - albuterol (PROAIR HFA) 108 (90 Base) MCG/ACT inhaler; INHALE 2 PUFFS INTO THE LUNGS EVERY 6 HOURS AS NEEDED FOR WHEEZING OR SHORTNESS OF BREATH  Dispense: 8.5 g; Refill: 0 6. Gastroesophageal reflux disease without esophagitis - sucralfate (CARAFATE) 1 g tablet; TAKE 1 TABLET BY MOUTH 1 HOUR BEFORE MEALS AND AT BEDTIME AS NEEDED  Dispense: 120 tablet; Refill: 1    Christina Ethon Wymer, PA-C  [facility] 12/07/2016 11:47 AM

## 2016-12-08 LAB — CMP14+EGFR
ALT: 12 IU/L (ref 0–32)
AST: 17 IU/L (ref 0–40)
Albumin/Globulin Ratio: 1.4 (ref 1.2–2.2)
Albumin: 3.9 g/dL (ref 3.6–4.8)
Alkaline Phosphatase: 93 IU/L (ref 39–117)
BUN/Creatinine Ratio: 14 (ref 12–28)
BUN: 13 mg/dL (ref 8–27)
Bilirubin Total: 0.3 mg/dL (ref 0.0–1.2)
CO2: 22 mmol/L (ref 20–29)
Calcium: 9.5 mg/dL (ref 8.7–10.3)
Chloride: 102 mmol/L (ref 96–106)
Creatinine, Ser: 0.9 mg/dL (ref 0.57–1.00)
GFR calc Af Amer: 75 mL/min/{1.73_m2} (ref >59)
GFR calc non Af Amer: 65 mL/min/{1.73_m2} (ref >59)
Globulin, Total: 2.7 g/dL (ref 1.5–4.5)
Glucose: 73 mg/dL (ref 65–99)
Potassium: 4 mmol/L (ref 3.5–5.2)
Sodium: 140 mmol/L (ref 134–144)
Total Protein: 6.6 g/dL (ref 6.0–8.5)

## 2016-12-08 LAB — LIPID PANEL
Chol/HDL Ratio: 3.1 ratio (ref 0.0–4.4)
Cholesterol, Total: 170 mg/dL (ref 100–199)
HDL: 54 mg/dL (ref >39)
LDL Calculated: 97 mg/dL (ref 0–99)
Triglycerides: 93 mg/dL (ref 0–149)
VLDL Cholesterol Cal: 19 mg/dL (ref 5–40)

## 2016-12-09 NOTE — Progress Notes (Signed)
Cholesterol, kidneys and liver look great. Continue with current dose of blood pressure and cholesterol medication.

## 2017-01-04 ENCOUNTER — Other Ambulatory Visit: Payer: Self-pay | Admitting: Gastroenterology

## 2017-04-02 ENCOUNTER — Telehealth: Payer: Self-pay | Admitting: Physician Assistant

## 2017-04-02 ENCOUNTER — Encounter: Payer: Self-pay | Admitting: Physician Assistant

## 2017-04-02 ENCOUNTER — Ambulatory Visit (INDEPENDENT_AMBULATORY_CARE_PROVIDER_SITE_OTHER): Payer: BLUE CROSS/BLUE SHIELD | Admitting: Physician Assistant

## 2017-04-02 VITALS — BP 136/84 | HR 64 | Temp 98.2°F | Resp 18 | Ht 67.0 in | Wt 168.6 lb

## 2017-04-02 DIAGNOSIS — K219 Gastro-esophageal reflux disease without esophagitis: Secondary | ICD-10-CM

## 2017-04-02 DIAGNOSIS — K635 Polyp of colon: Secondary | ICD-10-CM

## 2017-04-02 DIAGNOSIS — Z23 Encounter for immunization: Secondary | ICD-10-CM

## 2017-04-02 DIAGNOSIS — I10 Essential (primary) hypertension: Secondary | ICD-10-CM | POA: Diagnosis not present

## 2017-04-02 DIAGNOSIS — Z87891 Personal history of nicotine dependence: Secondary | ICD-10-CM | POA: Diagnosis not present

## 2017-04-02 DIAGNOSIS — Z0000 Encounter for general adult medical examination without abnormal findings: Secondary | ICD-10-CM

## 2017-04-02 DIAGNOSIS — E785 Hyperlipidemia, unspecified: Secondary | ICD-10-CM

## 2017-04-02 MED ORDER — SUCRALFATE 1 G PO TABS: ORAL_TABLET | ORAL | 99 refills | Status: DC

## 2017-04-02 MED ORDER — ZOSTER VAC RECOMB ADJUVANTED 50 MCG/0.5ML IM SUSR: 0.5000 mL | Freq: Once | INTRAMUSCULAR | 1 refills | Status: AC

## 2017-04-02 NOTE — Telephone Encounter (Signed)
Christina [name] - Christina [name] saw you today for CPE and says we forgot one of her prescriptions.  She needs the omeprazole. [phone]

## 2017-04-02 NOTE — Progress Notes (Signed)
Subjective:    Patient ID: Christina [name], female    DOB: 1946-05-13, 71 y.o.   MRN: [id number]  HPI Christina [name] is a 71 year old African American female with a past medical history significant for hypertension, hyperlipidemia, GERD, and history of smoking who presents today for her annual physical. She does not have any concerns today and thinks everything   Christina [name] is compliant with all of her medications and is not experiencing any adverse effects.  She measures her blood pressure at home 2 times per month. Christina [name] states her at home blood pressures range 128-138/80-81. She denies vision changes. Christina [name] last optometry appointment was 1 year ago and she plans to schedule her annual appointment. She also denies headaches, chest pain, dyspnea, peripheral edema, orthopnea, or PND.   Christina [name] also reports her GERD is well controlled since starting omeprazole. She states she uses sucralfate as needed when she eats something spicy, but tries to avoid spicy foods. She reports she tries to eat pretty healthy. Christina [name] reports she eats 2 well-balanced meals a day. She states she sometimes snacks throughout the day. 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   Christina [name] denies alcohol, tobacco, and illicit drug use. She reports she quit smoking 1 year ago after smoking for 35-40 years.   Medications:  Albuterol 108 mcg/act 2 puffs 1-2 times per week as needed cough and shortness of breath  Amlodipine 10mg  daily Aspirin 81mg  daily  Atorvastatin 40mg  daily  Omeprazole 40mg  daily  Sucralfate 1g as needed   Allergies:  Coconut oil (reaction: hives)  Codeine (reaction: upset stomach) Shrimp/Shellfish (reaction: hives)  Trandolapril (reaction: swelling)   Chronic Medical Conditions:  Hypertension  Hyperlipidemia  GERD  Surgeries:  C-section in 1980s   Immunizations:  Up to date   Cancer  Screening: Last mammogram in January  Last colonoscopy in October    Review of Systems     Objective:   Physical Exam Anthropometrics:  Height: 5'7" Weight: 168 lbs 9.6oz   Vitals:  Temp: 98.2 F, orally  BP: 136/84 HR: 64 bpm RR: 18 breaths per minute   General: 71 year old African American female sitting comfortably on exam table in no acute distress.  Head: Normocephalic, atraumatic.  Skin: Warm and dry. Eyes: Conjunctiva pink. Pupils equal and reactive to light and accomodation. Extraoccular movements intact.  Ears: External auditory canal non-erythematous bilaterally. TMs pearly gray bilaterally without erythema or effusions.  Nose: Nares patent bilaterally. No rhinorrhea. Mouth: Good dentition. Lips and oral mucosa without lesions. Posterior oropharynx without erythema.  Neck: Supple. No cervical lymphadenopathy or thyromegaly. No thyroid tenderness.  Cardiovascular: Regular rate and rhythm. S1 and S2 distinct. No murmurs auscultated. 2+ radial, dorsalis pedis, and posterior tibialis pulses.  Lungs: Faint coarse breath sounds in right middle and lower lobes.  Abdomen: Non-distended. Bowel sounds active in all 4 quadrants. Non-tender to palpation in all four quadrants.  MSK: 5/5 strength in all extremities. Neuro: Cranial nerves intact. Sensation intact in all extremities.    Assessment & Plan:  1. Annual physical exam - Discussed the importance of maintaining healthy eating habits and well as exercising for at least 30 minutes per day, 5 times per week.  - Patient received necessary immunizations today   2. Essential hypertension - CBC with Differential/Platelet obtained today in clinic - Comprehensive metabolic panel obtained today in clinic - TSH obtained today in  clinic - Urinalysis, dipstick only obtained today in clinic - Continue Amlodipine 10mg  daily - Encouraged patient to continue measuring blood pressures at home.  - Also encouraged patient to continue to  eat a lot of fruits and vegetables, lean meats, maintain a low sodium diet and exercise at least 30 minutes per day 5 times per week.  - Return to clinic in 3 months for blood pressure re-check.   3. Hyperlipidemia, unspecified hyperlipidemia type - CMP obtained today in clinic - Lipid panel obtained today in clinic - Continue Atorvastatin 40mg  daily - Encouraged patient to continue to eat a lot of fruits and vegetables, lean meats, maintain a low sodium diet and exercise at least 30 minutes per day 5 times per week.  - If cholesterol within normal limits, will recheck next year. If cholesterol abnormal, plan to adjust Atorvastatin and re-check in 3 months.   4. GERD without esophagitis - Continue Omeprazole 40mg  daily  - Continue Sucralfate 1 g tablet before meals and bedtime as needed - Sucralfate refilled today in clinic - Patient should avoid greasy, spicy foods and not lay down until at least 30 minutes after meals. - Return to clinic with worsening reflux that is not controlled with Omeprazole and Sucralfate.  5. Need for influenza vaccination - Flu Vaccine QUAD 36+ mos IM administered today in clinic.  6. Need for Tdap vaccination - Tdap vaccine greater than or equal to 7yo IM administered today in clinic.  7. Need for shingles vaccine - Prescription for Zoster Vaccine Adjuvanted injection sent to pharmacy today  8. Polyp of colon, unspecified part of colon, unspecified type - Patient will be due for next colonoscopy 04/15/2019  Danni Shima, PA-S

## 2017-04-02 NOTE — Patient Instructions (Addendum)
   IF you received an x-ray today, you will receive an invoice from [facility]. Please contact [facility] at [phone] with questions or concerns regarding your invoice.   IF you received labwork today, you will receive an invoice from [facility]. Please contact [facility] at [phone] with questions or concerns regarding your invoice.   Our billing staff will not be able to assist you with questions regarding bills from these companies.  You will be contacted with the lab results as soon as they are available. The fastest way to get your results is to activate your My Chart account. Instructions are located on the last page of this paperwork. If you have not heard from us regarding the results in 2 weeks, please contact this office.      Preventive Care 65 Years and Older, Female Preventive care refers to lifestyle choices and visits with your health care provider that can promote health and wellness. What does preventive care include?  A yearly physical exam. This is also called an annual well check.  Dental exams once or twice a year.  Routine eye exams. Ask your health care provider how often you should have your eyes checked.  Personal lifestyle choices, including: ? Daily care of your teeth and gums. ? Regular physical activity. ? Eating a healthy diet. ? Avoiding tobacco and drug use. ? Limiting alcohol use. ? Practicing safe sex. ? Taking low-dose aspirin every day. ? Taking vitamin and mineral supplements as recommended by your health care provider. What happens during an annual well check? The services and screenings done by your health care provider during your annual well check will depend on your age, overall health, lifestyle risk factors, and family history of disease. Counseling Your health care provider may ask you questions about your:  Alcohol use.  Tobacco use.  Drug use.  Emotional well-being.  Home and relationship  well-being.  Sexual activity.  Eating habits.  History of falls.  Memory and ability to understand (cognition).  Work and work environment.  Reproductive health.  Screening You may have the following tests or measurements:  Height, weight, and BMI.  Blood pressure.  Lipid and cholesterol levels. These may be checked every 5 years, or more frequently if you are over 50 years old.  Skin check.  Lung cancer screening. You may have this screening every year starting at age 55 if you have a 30-pack-year history of smoking and currently smoke or have quit within the past 15 years.  Fecal occult blood test (FOBT) of the stool. You may have this test every year starting at age 50.  Flexible sigmoidoscopy or colonoscopy. You may have a sigmoidoscopy every 5 years or a colonoscopy every 10 years starting at age 50.  Hepatitis C blood test.  Hepatitis B blood test.  Sexually transmitted disease (STD) testing.  Diabetes screening. This is done by checking your blood sugar (glucose) after you have not eaten for a while (fasting). You may have this done every 1-3 years.  Bone density scan. This is done to screen for osteoporosis. You may have this done starting at age 65.  Mammogram. This may be done every 1-2 years. Talk to your health care provider about how often you should have regular mammograms.  Talk with your health care provider about your test results, treatment options, and if necessary, the need for more tests. Vaccines Your health care provider may recommend certain vaccines, such as:  Influenza vaccine. This is recommended every year.    Tetanus, diphtheria, and acellular pertussis (Tdap, Td) vaccine. You may need a Td booster every 10 years.  Varicella vaccine. You may need this if you have not been vaccinated.  Zoster vaccine. You may need this after age 60.  Measles, mumps, and rubella (MMR) vaccine. You may need at least one dose of MMR if you were born in  1955 or later. You may also need a second dose.  Pneumococcal 13-valent conjugate (PCV13) vaccine. One dose is recommended after age 65.  Pneumococcal polysaccharide (PPSV23) vaccine. One dose is recommended after age 65.  Meningococcal vaccine. You may need this if you have certain conditions.  Hepatitis A vaccine. You may need this if you have certain conditions or if you travel or work in places where you may be exposed to hepatitis A.  Hepatitis B vaccine. You may need this if you have certain conditions or if you travel or work in places where you may be exposed to hepatitis B.  Haemophilus influenzae type b (Hib) vaccine. You may need this if you have certain conditions.  Talk to your health care provider about which screenings and vaccines you need and how often you need them. This information is not intended to replace advice given to you by your health care provider. Make sure you discuss any questions you have with your health care provider. Document Released: 06/30/2015 Document Revised: 02/21/2016 Document Reviewed: 04/04/2015 Elsevier Interactive Patient Education  2017 Elsevier Inc.  

## 2017-04-02 NOTE — Progress Notes (Signed)
Patient ID: Christina [name], female    DOB: 12-12-1945, 71 y.o.   MRN: [id number]  PCP: Harrison Mons, PA-C  Chief Complaint  Patient presents with  . Annual Exam    No PAP needed.  . Medication Refill    Sucralfate 1 G    Subjective:   Presents for [employer].  She reports doing well, taking her medications as prescribed and without adverse effects. She has been a non-smoker x 12 months, following a 40 pack year history.  Cervical Cancer Screening: with GYN Breast Cancer Screening: mammogram 06/2016 Colorectal Cancer Screening: 03/2014, repeat in 5 years due to polyps. Bone Density Testing: ?with GYN since 2007? HIV Screening: no longer a candidate STI Screening: very low risk Seasonal Influenza Vaccination: today Td/Tdap Vaccination: today Pneumococcal Vaccination: complete Zoster Vaccination: unknown    Patient Active Problem List   Diagnosis Date Noted  . Colon polyps 04/02/2017  . Former smoker 09/27/2016  . Cough 04/25/2016  . 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 GERD (gastroesophageal reflux disease) 08/10/2013  . Hyperlipidemia 06/23/2013  . HTN (hypertension) 07/23/2012    Past Medical History:  Diagnosis Date  . Allergy   . GERD (gastroesophageal reflux disease)   . Hyperlipidemia   . Hypertension   . Reflux   . Tubulovillous adenoma of colon 2003     Prior to Admission medications   Medication Sig Start Date End Date Taking? Authorizing Provider  albuterol (PROAIR HFA) 108 (90 Base) MCG/ACT inhaler INHALE 2 PUFFS INTO THE LUNGS EVERY 6 HOURS AS NEEDED FOR WHEEZING OR SHORTNESS OF BREATH 12/07/16  Yes McVey, Gelene Mink, PA-C  amLODipine (NORVASC) 10 MG tablet Take 1 tablet (10 mg total) by mouth daily. 12/07/16  Yes McVey, Gelene Mink, PA-C  aspirin 81 MG tablet Take 81 mg by mouth daily.   Yes [provider]  atorvastatin (LIPITOR) 40 MG tablet TAKE 1 TABLET(40 MG) BY MOUTH DAILY 12/07/16  Yes McVey,  Gelene Mink, PA-C  omeprazole (PRILOSEC) 40 MG capsule TAKE ONE CAPSULE BY MOUTH DAILY 10/07/16  Yes Ladene Artist, MD  sucralfate (CARAFATE) 1 g tablet TAKE 1 TABLET BY MOUTH 1 HOUR BEFORE MEALS AND AT BEDTIME AS NEEDED 12/07/16  Yes McVey, Gelene Mink, PA-C  Azelastine HCl 0.15 % SOLN Place 2 sprays into both nostrils 2 (two) times daily. Patient not taking: Reported on 04/02/2017 09/27/16   Harrison Mons, PA-C  meloxicam (MOBIC) 15 MG tablet Take 1 tablet (15 mg total) by mouth daily. Patient not taking: Reported on 04/02/2017 12/07/16   McVey, Gelene Mink, PA-C    Allergies  Allergen Reactions  . Coconut Oil     HIVES  . Codeine Other (See Comments)    Upsets stomach really bad  . Shrimp [Shellfish Allergy] Hives  . Trandolapril Swelling    Swelling of the face/lips 07/2014    Past Surgical History:  Procedure Laterality Date  . CESAREAN SECTION     1 time  . COLONOSCOPY      Family History  Problem Relation Age of Onset  . Hypertension Mother   . Heart disease Mother   . Leukemia Father   . Cancer Father   . Hypertension Sister   . Hypertension Sister     Social History   Social History  . Marital status: Married    Spouse name: [name]  . Number of children: 1  . Years of education: 12th grade   Occupational History  . school housekeeping  part-time   Social History Main Topics  . Smoking status: Former Smoker    Packs/day: 0.50    Years: 4.00    Types: Cigarettes  . Smokeless tobacco: Never Used     Comment: Previously quit in 2015 but has started smoking again   . Alcohol use No  . Drug use: No  . Sexual activity: No   Other Topics Concern  . None   Social History Narrative   Lives with her husband. Her daughter also lives in [place] with her husband and 3 children.       Review of Systems  Constitutional: Negative.   HENT: Negative for sore throat.   Eyes: Negative for visual disturbance.  Respiratory: Negative  for cough, chest tightness, shortness of breath and wheezing.   Cardiovascular: Negative for chest pain and palpitations.  Gastrointestinal: Negative for abdominal pain, diarrhea, nausea and vomiting.  Endocrine: Negative.   Genitourinary: Negative for dysuria, frequency, hematuria and urgency.  Musculoskeletal: Negative for arthralgias and myalgias.  Skin: Negative for rash.  Allergic/Immunologic: Negative.   Neurological: Negative for dizziness, weakness and headaches.  Hematological: Negative.   Psychiatric/Behavioral: Negative for decreased concentration. The patient is not nervous/anxious.         Objective:  Physical Exam  Constitutional: She is oriented to person, place, and time. Vital signs are normal. She appears well-developed and well-nourished. She is active and cooperative. No distress.  BP 136/84 (BP Location: Left Arm, Patient Position: Sitting, Cuff Size: Normal)   Pulse 64   Temp 98.2 F (36.8 C) (Oral)   Resp 18   Ht 5\' 7"  (1.702 m)   Wt 168 lb 9.6 oz (76.5 kg)   BMI 26.41 kg/m    HENT:  Head: Normocephalic and atraumatic.  Right Ear: Hearing, tympanic membrane, external ear and ear canal normal. No foreign bodies.  Left Ear: Hearing, tympanic membrane, external ear and ear canal normal. No foreign bodies.  Nose: Nose normal.  Mouth/Throat: Uvula is midline, oropharynx is clear and moist and mucous membranes are normal. No oral lesions. Normal dentition. No dental abscesses or uvula swelling. No oropharyngeal exudate.  Eyes: Pupils are equal, round, and reactive to light. Conjunctivae, EOM and lids are normal. Right eye exhibits no discharge. Left eye exhibits no discharge. No scleral icterus.  Fundoscopic exam:      The right eye shows no arteriolar narrowing, no AV nicking, no exudate, no hemorrhage and no papilledema. The right eye shows red reflex.       The left eye shows no arteriolar narrowing, no AV nicking, no exudate, no hemorrhage and no papilledema.  The left eye shows red reflex.  Neck: Trachea normal, normal range of motion and full passive range of motion without pain. Neck supple. No spinous process tenderness and no muscular tenderness present. No thyroid mass and no thyromegaly present.  Cardiovascular: Normal rate, regular rhythm, normal heart sounds, intact distal pulses and normal pulses.   Pulmonary/Chest: Effort normal and breath sounds normal.  Abdominal: Soft. Bowel sounds are normal. There is no tenderness.  Musculoskeletal: She exhibits no edema or tenderness.       Cervical back: Normal.       Thoracic back: Normal.       Lumbar back: Normal.  Lymphadenopathy:       Head (right side): No tonsillar, no preauricular, no posterior auricular and no occipital adenopathy present.       Head (left side): No tonsillar, no preauricular, no posterior auricular and no  occipital adenopathy present.    She has no cervical adenopathy.       Right: No supraclavicular adenopathy present.       Left: No supraclavicular adenopathy present.  Neurological: She is alert and oriented to person, place, and time. She has normal strength and normal reflexes. No cranial nerve deficit. She exhibits normal muscle tone. Coordination and gait normal.  Skin: Skin is warm, dry and intact. No rash noted. She is not diaphoretic. No cyanosis or erythema. Nails show no clubbing.  Psychiatric: She has a normal mood and affect. Her speech is normal and behavior is normal. Judgment and thought content normal.           Assessment & Plan:   Problem List Items Addressed This Visit    HTN (hypertension)    COntrolled. Continue current treatment. Encouraged healthy lifestyle changes.      Relevant Orders   CBC with Differential/Platelet (Completed)   Comprehensive metabolic panel (Completed)   TSH (Completed)   Urinalysis, dipstick only (Completed)   Hyperlipidemia    Await labs. Adjust regimen as indicated by results.      Relevant Orders    Comprehensive metabolic panel (Completed)   Lipid panel (Completed)   GERD (gastroesophageal reflux disease)    Well controlled with PPI and PRN sucralfate.      Relevant Medications   sucralfate (CARAFATE) 1 g tablet   Former smoker    Congratulated on smoking cessation.      Colon polyps    Follow-up in 5 years (03/2019) per GI.       Other Visit Diagnoses    Annual physical exam    -  Primary   Age appropriate health guidance provided   Need for influenza vaccination       Relevant Orders   Flu Vaccine QUAD 36+ mos IM (Completed)   Need for Tdap vaccination       Relevant Orders   Tdap vaccine greater than or equal to 7yo IM (Completed)   Need for shingles vaccine       Obtain this at her local pharmacy       Return in about 3 months (around 07/03/2017) for re-evalaution of glucose, blood pressure.   Fara Chute, PA-C [facility]

## 2017-04-03 LAB — COMPREHENSIVE METABOLIC PANEL
A/G RATIO: 1.6 (ref 1.2–2.2)
ALK PHOS: 95 IU/L (ref 39–117)
ALT: 21 IU/L (ref 0–32)
AST: 25 IU/L (ref 0–40)
Albumin: 4.2 g/dL (ref 3.6–4.8)
BILIRUBIN TOTAL: 0.4 mg/dL (ref 0.0–1.2)
BUN/Creatinine Ratio: 13 (ref 12–28)
BUN: 11 mg/dL (ref 8–27)
CHLORIDE: 103 mmol/L (ref 96–106)
CO2: 25 mmol/L (ref 20–29)
Calcium: 9.4 mg/dL (ref 8.7–10.3)
Creatinine, Ser: 0.86 mg/dL (ref 0.57–1.00)
GFR calc Af Amer: 80 mL/min/{1.73_m2} (ref >59)
GFR calc non Af Amer: 69 mL/min/{1.73_m2} (ref >59)
GLUCOSE: 87 mg/dL (ref 65–99)
Globulin, Total: 2.6 g/dL (ref 1.5–4.5)
POTASSIUM: 3.7 mmol/L (ref 3.5–5.2)
Sodium: 144 mmol/L (ref 134–144)
Total Protein: 6.8 g/dL (ref 6.0–8.5)

## 2017-04-03 LAB — CBC WITH DIFFERENTIAL/PLATELET
Basophils Absolute: 0.1 10*3/uL (ref 0.0–0.2)
Basos: 1 %
EOS (ABSOLUTE): 0.2 10*3/uL (ref 0.0–0.4)
Eos: 4 %
Hematocrit: 39.7 % (ref 34.0–46.6)
Hemoglobin: 12.9 g/dL (ref 11.1–15.9)
IMMATURE GRANS (ABS): 0 10*3/uL (ref 0.0–0.1)
IMMATURE GRANULOCYTES: 0 %
LYMPHS: 41 %
Lymphocytes Absolute: 2.5 10*3/uL (ref 0.7–3.1)
MCH: 28.8 pg (ref 26.6–33.0)
MCHC: 32.5 g/dL (ref 31.5–35.7)
MCV: 89 fL (ref 79–97)
MONOS ABS: 0.5 10*3/uL (ref 0.1–0.9)
Monocytes: 8 %
NEUTROS PCT: 46 %
Neutrophils Absolute: 2.8 10*3/uL (ref 1.4–7.0)
PLATELETS: 336 10*3/uL (ref 150–379)
RBC: 4.48 x10E6/uL (ref 3.77–5.28)
RDW: 15.8 % — AB (ref 12.3–15.4)
WBC: 5.9 10*3/uL (ref 3.4–10.8)

## 2017-04-03 LAB — URINALYSIS, DIPSTICK ONLY
Bilirubin, UA: NEGATIVE
Glucose, UA: NEGATIVE
KETONES UA: NEGATIVE
LEUKOCYTES UA: NEGATIVE
NITRITE UA: NEGATIVE
PH UA: 6 (ref 5.0–7.5)
Protein, UA: NEGATIVE
RBC UA: NEGATIVE
Specific Gravity, UA: 1.012 (ref 1.005–1.030)
UUROB: 0.2 mg/dL (ref 0.2–1.0)

## 2017-04-03 LAB — LIPID PANEL
CHOLESTEROL TOTAL: 154 mg/dL (ref 100–199)
Chol/HDL Ratio: 2.8 ratio (ref 0.0–4.4)
HDL: 56 mg/dL (ref >39)
LDL Calculated: 83 mg/dL (ref 0–99)
Triglycerides: 76 mg/dL (ref 0–149)
VLDL CHOLESTEROL CAL: 15 mg/dL (ref 5–40)

## 2017-04-03 LAB — TSH: TSH: 1.24 u[IU]/mL (ref 0.450–4.500)

## 2017-04-07 ENCOUNTER — Other Ambulatory Visit: Payer: Self-pay

## 2017-04-07 MED ORDER — OMEPRAZOLE 40 MG PO CPDR: 40.0000 mg | DELAYED_RELEASE_CAPSULE | Freq: Every day | ORAL | 0 refills | Status: DC

## 2017-04-07 NOTE — Assessment & Plan Note (Signed)
COntrolled. Continue current treatment. Encouraged healthy lifestyle changes.

## 2017-04-07 NOTE — Assessment & Plan Note (Signed)
Congratulated on smoking cessation.  

## 2017-04-07 NOTE — Telephone Encounter (Signed)
Pt called again about this

## 2017-04-07 NOTE — Assessment & Plan Note (Signed)
Await labs. Adjust regimen as indicated by results.  

## 2017-04-07 NOTE — Assessment & Plan Note (Signed)
Well controlled with PPI and PRN sucralfate.

## 2017-04-07 NOTE — Telephone Encounter (Signed)
Reordered. Pt made aware.

## 2017-04-07 NOTE — Assessment & Plan Note (Signed)
Follow-up in 5 years (03/2019) per GI.

## 2017-05-27 ENCOUNTER — Encounter: Payer: BLUE CROSS/BLUE SHIELD | Admitting: Physician Assistant

## 2017-07-01 ENCOUNTER — Other Ambulatory Visit: Payer: Self-pay

## 2017-07-01 ENCOUNTER — Encounter: Payer: Self-pay | Admitting: Physician Assistant

## 2017-07-01 ENCOUNTER — Ambulatory Visit: Payer: BLUE CROSS/BLUE SHIELD | Admitting: Physician Assistant

## 2017-07-01 VITALS — BP 138/94 | HR 82 | Temp 98.5°F | Resp 18 | Ht 67.0 in | Wt 170.2 lb

## 2017-07-01 DIAGNOSIS — K219 Gastro-esophageal reflux disease without esophagitis: Secondary | ICD-10-CM

## 2017-07-01 DIAGNOSIS — R062 Wheezing: Secondary | ICD-10-CM

## 2017-07-01 DIAGNOSIS — I10 Essential (primary) hypertension: Secondary | ICD-10-CM | POA: Diagnosis not present

## 2017-07-01 DIAGNOSIS — E785 Hyperlipidemia, unspecified: Secondary | ICD-10-CM | POA: Diagnosis not present

## 2017-07-01 DIAGNOSIS — E010 Iodine-deficiency related diffuse (endemic) goiter: Secondary | ICD-10-CM

## 2017-07-01 MED ORDER — OMEPRAZOLE 40 MG PO CPDR: 40.0000 mg | DELAYED_RELEASE_CAPSULE | Freq: Every day | ORAL | 3 refills | Status: DC

## 2017-07-01 MED ORDER — ALBUTEROL SULFATE HFA 108 (90 BASE) MCG/ACT IN AERS: INHALATION_SPRAY | RESPIRATORY_TRACT | 0 refills | Status: DC

## 2017-07-01 NOTE — Assessment & Plan Note (Signed)
Has been well controlled.  Labs not updated today.

## 2017-07-01 NOTE — Progress Notes (Signed)
Subjective:    Patient ID: Christina [name], female    DOB: [birth year], 72 y.o.   MRN: [id number]  Patient was last seen on 04/02/17 for her annual wellness visit. Patient's HTN, HL, and GERD were well controlled at this visit. CBC, CMP, TSH, U/A, and Lipids were drawn at this visit. All levels were within normal limits.   She monitors her blood pressure every morning. It usually runs from 120-128/80. She denies any adverse effects from her medications. She denies swelling, muscle pain, nausea, vomiting, diarrhea, dizziness, or headache.   Her GERD is well controlled with Prilosec. Extra spices/seasonings and pinto beans are triggers.  Diet consists of vegetables. She tries to stay away from fried foods. She stays active at her job. She works 3 hours a day and stays on her feet. She is a former smoker and quit in 2015.Marland Kitchen   She has no complaints at today's visit and feels well.    Review of Systems As above  Patient Active Problem List   Diagnosis Date Noted  . Thyromegaly 07/01/2017  . Colon polyps 04/02/2017  . Former smoker 09/27/2016  . Cough 04/25/2016  . 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 GERD (gastroesophageal reflux disease) 08/10/2013  . Hyperlipidemia 06/23/2013  . HTN (hypertension) 07/23/2012   Prior to Admission medications   Medication Sig Start Date End Date Taking? Authorizing Provider  albuterol (PROAIR HFA) 108 (90 Base) MCG/ACT inhaler INHALE 2 PUFFS INTO THE LUNGS EVERY 6 HOURS AS NEEDED FOR WHEEZING OR SHORTNESS OF BREATH 07/01/17  Yes Jeffery, Chelle, PA-C  amLODipine (NORVASC) 10 MG tablet Take 1 tablet (10 mg total) by mouth daily. 12/07/16  Yes McVey, Gelene Mink, PA-C  aspirin 81 MG tablet Take 81 mg by mouth daily.   Yes [provider]  atorvastatin (LIPITOR) 40 MG tablet TAKE 1 TABLET(40 MG) BY MOUTH DAILY 12/07/16  Yes McVey, Gelene Mink, PA-C  omeprazole (PRILOSEC) 40 MG capsule Take 1 capsule (40 mg total) by mouth  daily. 07/01/17  Yes Jeffery, Chelle, PA-C  sucralfate (CARAFATE) 1 g tablet TAKE 1 TABLET BY MOUTH 1 HOUR BEFORE MEALS AND AT BEDTIME AS NEEDED 04/02/17  Yes Jeffery, Chelle, PA-C  Azelastine HCl 0.15 % SOLN Place 2 sprays into both nostrils 2 (two) times daily. Patient not taking: Reported on 04/02/2017 09/27/16   Harrison Mons, PA-C  meloxicam (MOBIC) 15 MG tablet Take 1 tablet (15 mg total) by mouth daily. Patient not taking: Reported on 04/02/2017 12/07/16   McVey, Gelene Mink, PA-C   Allergies  Allergen Reactions  . Coconut Oil     HIVES  . Codeine Other (See Comments)    Upsets stomach really bad  . Shrimp [Shellfish Allergy] Hives  . Trandolapril Swelling    Swelling of the face/lips 07/2014      Objective:   Physical Exam  Constitutional: She is oriented to person, place, and time. She appears well-developed and well-nourished.  HENT:  Mouth/Throat: Oropharynx is clear and moist.  Neck: Thyromegaly present.  Cardiovascular: Normal rate, regular rhythm, normal heart sounds and intact distal pulses.  Pulses:      Radial pulses are 2+ on the left side.       Posterior tibial pulses are 2+ on the right side, and 2+ on the left side.  Pulmonary/Chest: Effort normal and breath sounds normal.  Musculoskeletal: She exhibits no edema.  Neurological: She is alert and oriented to person, place, and time.  Psychiatric: She has a normal mood and affect.  Her behavior is normal.      Vitals:   07/01/17 0836 07/01/17 0910  BP: (!) 142/88 (!) 138/94  Pulse: 82   Resp: 18   Temp: 98.5 F (36.9 C)   SpO2: 98%     Assessment & Plan:  1. Essential hypertension Well controlled with Amlodipine.  2. Hyperlipidemia, unspecified hyperlipidemia type Well controlled on Atorvastatin.   3. Gastroesophageal reflux disease without esophagitis Well controlled.  - omeprazole (PRILOSEC) 40 MG capsule; Take 1 capsule (40 mg total) by mouth daily.  Dispense: 90 capsule; Refill: 3  4.  Thyromegaly Enlargement of thyroid felt on palpation. Patient states this is "normal" for her. TSH level on 04/02/17 within normal limits. We will pull patient records to see if there has been a previous ultrasound of her thyroid. If not, plan to order ultrasound.  5. Expiratory wheezing Continue Albuterol PRN.  - albuterol (PROAIR HFA) 108 (90 Base) MCG/ACT inhaler; INHALE 2 PUFFS INTO THE LUNGS EVERY 6 HOURS AS NEEDED FOR WHEEZING OR SHORTNESS OF BREATH  Dispense: 8.5 g; Refill: 0  Return in about 3 months (around 09/29/2017) for re-evaluation of blood pressure.  Noemi Chapel, PA-S

## 2017-07-01 NOTE — Patient Instructions (Addendum)
We will pull your paper record, and see if you have ever had an ultrasound of your thyroid. If not, I'll plan to order one.    IF you received an x-ray today, you will receive an invoice from [facility]. Please contact [facility] at [phone] with questions or concerns regarding your invoice.   IF you received labwork today, you will receive an invoice from [facility]. Please contact [facility] at [phone] with questions or concerns regarding your invoice.   Our billing staff will not be able to assist you with questions regarding bills from these companies.  You will be contacted with the lab results as soon as they are available. The fastest way to get your results is to activate your My Chart account. Instructions are located on the last page of this paperwork. If you have not heard from [place] regarding the results in 2 weeks, please contact this office.

## 2017-07-01 NOTE — Progress Notes (Signed)
Patient ID: Christina [name], female    DOB: [birth year], 72 y.o.   MRN: [id number]  PCP: Harrison Mons, PA-C  Chief Complaint  Patient presents with  . Hypertension  . Glucose Check  . Follow-up    Subjective:   Presents for evaluation of hypertension.  This was also indicated to be a glucose recheck though review of her chart reveals no abnormal glucose readings.  She reports she is doing well, feeling well.  Remains a non-smoker.  Tolerating her current medications without difficulty.  Review of Systems  Constitutional: Negative.   HENT: Negative for sore throat.   Eyes: Negative for visual disturbance.  Respiratory: Negative for cough, chest tightness, shortness of breath and wheezing.   Cardiovascular: Negative for chest pain and palpitations.  Gastrointestinal: Negative for abdominal pain, diarrhea, nausea and vomiting.  Genitourinary: Negative for dysuria, frequency, hematuria and urgency.  Musculoskeletal: Negative for arthralgias and myalgias.  Skin: Negative for rash.  Neurological: Negative for dizziness, weakness and headaches.  Psychiatric/Behavioral: Negative for decreased concentration. The patient is not nervous/anxious.        Patient Active Problem List   Diagnosis Date Noted  . Colon polyps 04/02/2017  . Former smoker 09/27/2016  . Cough 04/25/2016  . 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 GERD (gastroesophageal reflux disease) 08/10/2013  . Hyperlipidemia 06/23/2013  . HTN (hypertension) 07/23/2012     Prior to Admission medications   Medication Sig Start Date End Date Taking? Authorizing Provider  albuterol (PROAIR HFA) 108 (90 Base) MCG/ACT inhaler INHALE 2 PUFFS INTO THE LUNGS EVERY 6 HOURS AS NEEDED FOR WHEEZING OR SHORTNESS OF BREATH 12/07/16  Yes McVey, Gelene Mink, PA-C  amLODipine (NORVASC) 10 MG tablet Take 1 tablet (10 mg total) by mouth daily. 12/07/16  Yes McVey, Gelene Mink, PA-C  aspirin 81 MG tablet Take  81 mg by mouth daily.   Yes [provider]  atorvastatin (LIPITOR) 40 MG tablet TAKE 1 TABLET(40 MG) BY MOUTH DAILY 12/07/16  Yes McVey, Gelene Mink, PA-C  omeprazole (PRILOSEC) 40 MG capsule Take 1 capsule (40 mg total) by mouth daily. 04/07/17  Yes Jamespaul Secrist, PA-C  sucralfate (CARAFATE) 1 g tablet TAKE 1 TABLET BY MOUTH 1 HOUR BEFORE MEALS AND AT BEDTIME AS NEEDED 04/02/17  Yes Wayne Brunker, PA-C  Azelastine HCl 0.15 % SOLN Place 2 sprays into both nostrils 2 (two) times daily. Patient not taking: Reported on 04/02/2017 09/27/16   Harrison Mons, PA-C  meloxicam (MOBIC) 15 MG tablet Take 1 tablet (15 mg total) by mouth daily. Patient not taking: Reported on 04/02/2017 12/07/16   McVey, Gelene Mink, PA-C     Allergies  Allergen Reactions  . Coconut Oil     HIVES  . Codeine Other (See Comments)    Upsets stomach really bad  . Shrimp [Shellfish Allergy] Hives  . Trandolapril Swelling    Swelling of the face/lips 07/2014       Objective:  Physical Exam  Constitutional: She is oriented to person, place, and time. She appears well-developed and well-nourished. She is active and cooperative. No distress.  BP (!) 138/94   Pulse 82   Temp 98.5 F (36.9 C) (Oral)   Resp 18   Ht 5\' 7"  (1.702 m)   Wt 170 lb 3.2 oz (77.2 kg)   SpO2 98%   BMI 26.66 kg/m   HENT:  Head: Normocephalic and atraumatic.  Right Ear: Hearing normal.  Left Ear: Hearing normal.  Eyes: Conjunctivae are normal.  No scleral icterus.  Neck: Normal range of motion. Neck supple. Thyromegaly (LEFT lobe) present.  Cardiovascular: Normal rate, regular rhythm and normal heart sounds.  Pulses:      Radial pulses are 2+ on the right side, and 2+ on the left side.  Pulmonary/Chest: Effort normal and breath sounds normal.  Lymphadenopathy:       Head (right side): No tonsillar, no preauricular, no posterior auricular and no occipital adenopathy present.       Head (left side): No tonsillar, no  preauricular, no posterior auricular and no occipital adenopathy present.    She has no cervical adenopathy.       Right: No supraclavicular adenopathy present.       Left: No supraclavicular adenopathy present.  Neurological: She is alert and oriented to person, place, and time. No sensory deficit.  Skin: Skin is warm, dry and intact. No rash noted. No cyanosis or erythema. Nails show no clubbing.  Psychiatric: She has a normal mood and affect. Her speech is normal and behavior is normal.           Assessment & Plan:   Problem List Items Addressed This Visit    HTN (hypertension) - Primary    Slightly above goal systolically initially, then slightly above goal diastolically on recheck.  Elected to make no changes today.  Reevaluate in 3 months.      Hyperlipidemia    Has been well controlled.  Labs not updated today.      GERD (gastroesophageal reflux disease)    Well-controlled on current PPI regimen.  Continue.      Relevant Medications   omeprazole (PRILOSEC) 40 MG capsule   Thyromegaly    Long-standing, per patient.  We will pull her paper record to see if we have a thyroid ultrasound documented.  TSH has been normal.       Other Visit Diagnoses    Expiratory wheezing       Relevant Medications   albuterol (PROAIR HFA) 108 (90 Base) MCG/ACT inhaler       Return in about 3 months (around 09/29/2017) for re-evaluation of blood pressure.   Fara Chute, PA-C [facility]

## 2017-07-01 NOTE — Assessment & Plan Note (Signed)
Long-standing, per patient.  We will pull her paper record to see if we have a thyroid ultrasound documented.  TSH has been normal.

## 2017-07-01 NOTE — Assessment & Plan Note (Signed)
Well-controlled on current PPI regimen.  Continue.

## 2017-07-01 NOTE — Assessment & Plan Note (Signed)
Slightly above goal systolically initially, then slightly above goal diastolically on recheck.  Elected to make no changes today.  Reevaluate in 3 months.

## 2017-07-04 NOTE — Progress Notes (Signed)
Chart pulled, first volume has been shredded but the second is on your desk in front of your laptop.

## 2017-08-22 ENCOUNTER — Ambulatory Visit: Payer: BLUE CROSS/BLUE SHIELD | Admitting: Physician Assistant

## 2017-08-22 ENCOUNTER — Other Ambulatory Visit: Payer: Self-pay

## 2017-08-22 ENCOUNTER — Encounter: Payer: Self-pay | Admitting: Physician Assistant

## 2017-08-22 VITALS — BP 124/72 | HR 93 | Temp 98.8°F | Resp 18 | Ht 67.0 in | Wt 169.8 lb

## 2017-08-22 DIAGNOSIS — R05 Cough: Secondary | ICD-10-CM

## 2017-08-22 DIAGNOSIS — J069 Acute upper respiratory infection, unspecified: Secondary | ICD-10-CM

## 2017-08-22 DIAGNOSIS — R059 Cough, unspecified: Secondary | ICD-10-CM

## 2017-08-22 MED ORDER — IPRATROPIUM BROMIDE 0.06 % NA SOLN: 2.0000 | Freq: Three times a day (TID) | NASAL | 0 refills | Status: DC

## 2017-08-22 MED ORDER — BENZONATATE 100 MG PO CAPS: ORAL_CAPSULE | ORAL | 0 refills | Status: AC

## 2017-08-22 MED ORDER — HYDROCODONE-HOMATROPINE 5-1.5 MG/5ML PO SYRP: 5.0000 mL | ORAL_SOLUTION | Freq: Three times a day (TID) | ORAL | 0 refills | Status: DC | PRN

## 2017-08-22 NOTE — Patient Instructions (Addendum)
Keep taking the Mucinex, it will not improve your symptoms, but it will prevent a future sinus infection! We have added a daytime cough medication and nighttime cough medication to help you help. Use a humidifier and drink plenty of fluids.  Get as much rest as possible.    IF you received an x-ray today, you will receive an invoice from [facility]. Please contact [facility] at [phone] with questions or concerns regarding your invoice.   IF you received labwork today, you will receive an invoice from [facility]. Please contact [facility] at [phone] with questions or concerns regarding your invoice.   Our billing staff will not be able to assist you with questions regarding bills from these companies.  You will be contacted with the lab results as soon as they are available. The fastest way to get your results is to activate your My Chart account. Instructions are located on the last page of this paperwork. If you have not heard from [place] regarding the results in 2 weeks, please contact this office.

## 2017-08-22 NOTE — Progress Notes (Signed)
Subjective:    Patient ID: Christina [name], female    DOB: [birth year], 72 y.o.   MRN: [id number]   Chief Complaint  Patient presents with  . Cough    pt states coughing started two days ago and was cold last night     HPI   Patient began having congestion, cough, fatigue,chills, and hoarseness x 2 days ago. Patient states that cough is non-productive. Patient's grandchildren have also been sick. Patient states that "one of them got antibiotics" for their illness, but she is not sure what her grandchild was was diagnosed with. Pertinent negatives include throat pain, dizziness, body aches, or appetite change. Patient has been taking Mucinex since symptoms on x 2 days ago. Patient states that it has not alleviated symptoms. Patients states that there are no aggravating factors. Patient reports that symptoms are worse at night.  Patient has had influenza vaccination 04/02/2017. Patient is up to date on Pneumococcal vaccination (08/14/2015.)  Patient Active Problem List   Diagnosis Date Noted  . Thyromegaly 07/01/2017  . Colon polyps 04/02/2017  . Former smoker 09/27/2016  . Cough 04/25/2016  . 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 GERD (gastroesophageal reflux disease) 08/10/2013  . Hyperlipidemia 06/23/2013  . HTN (hypertension) 07/23/2012    Allergies  Allergen Reactions  . Coconut Oil     HIVES  . Codeine Other (See Comments)    Upsets stomach really bad  . Shrimp [Shellfish Allergy] Hives  . Trandolapril Swelling    Swelling of the face/lips 07/2014   Prior to Admission medications   Medication Sig Start Date End Date Taking? Authorizing Provider  albuterol (PROAIR HFA) 108 (90 Base) MCG/ACT inhaler INHALE 2 PUFFS INTO THE LUNGS EVERY 6 HOURS AS NEEDED FOR WHEEZING OR SHORTNESS OF BREATH 07/01/17  Yes Jeffery, Chelle, PA-C  amLODipine (NORVASC) 10 MG tablet Take 1 tablet (10 mg total) by mouth daily. 12/07/16  Yes McVey, Gelene Mink, PA-C  aspirin 81 MG  tablet Take 81 mg by mouth daily.   Yes [provider]  atorvastatin (LIPITOR) 40 MG tablet TAKE 1 TABLET(40 MG) BY MOUTH DAILY 12/07/16  Yes McVey, Gelene Mink, PA-C  omeprazole (PRILOSEC) 40 MG capsule Take 1 capsule (40 mg total) by mouth daily. 07/01/17  Yes Jeffery, Chelle, PA-C  sucralfate (CARAFATE) 1 g tablet TAKE 1 TABLET BY MOUTH 1 HOUR BEFORE MEALS AND AT BEDTIME AS NEEDED 04/02/17  Yes Harrison Mons, PA-C   Past Medical History:  Diagnosis Date  . Allergy   . GERD (gastroesophageal reflux disease)   . Hyperlipidemia   . Hypertension   . Reflux   . Tubulovillous adenoma of colon 2003   Social History   Socioeconomic History  . Marital status: Married    Spouse name: [name]  . Number of children: 1  . Years of education: 12th grade  . Highest education level: Not on file  Social Needs  . Financial resource strain: Not on file  . Food insecurity - worry: Not on file  . Food insecurity - inability: Not on file  . Transportation needs - medical: Not on file  . Transportation needs - non-medical: Not on file  Occupational History  . Occupation: school housekeeping    Comment: part-time  Tobacco Use  . Smoking status: Former Smoker    Packs/day: 0.50    Years: 4.00    Pack years: 2.00    Types: Cigarettes  . Smokeless tobacco: Never Used  . Tobacco comment: Previously quit in 2015  but has started smoking again   Substance and Sexual Activity  . Alcohol use: No    Alcohol/week: 0.0 oz  . Drug use: No  . Sexual activity: No  Other Topics Concern  . Not on file  Social History Narrative   Lives with her husband. Her daughter also lives in [place] with her husband and 3 children.   Family History  Problem Relation Age of Onset  . Hypertension Mother   . Heart disease Mother   . Leukemia Father   . Cancer Father   . Hypertension Sister   . Hypertension Sister    Past Surgical History:  Procedure Laterality Date  . CESAREAN SECTION     1  time  . COLONOSCOPY      Review of Systems  Constitutional: Positive for chills and fatigue. Negative for activity change, appetite change, diaphoresis and unexpected weight change.  HENT: Positive for congestion, rhinorrhea (Clear nasal discharge.) and voice change (Hoarseness.). Negative for ear discharge, ear pain, sinus pressure, sinus pain, sore throat and tinnitus.   Eyes: Negative.  Negative for discharge and redness.  Respiratory: Positive for cough. Negative for chest tightness and shortness of breath.   Cardiovascular: Negative.  Negative for chest pain and palpitations.  Gastrointestinal: Negative.  Negative for constipation, diarrhea, nausea and vomiting.  Genitourinary: Negative.  Negative for difficulty urinating, dysuria, frequency and urgency.  Musculoskeletal: Negative.  Negative for arthralgias, back pain, myalgias and neck pain.  Skin: Negative.  Negative for color change and rash.  Allergic/Immunologic: Negative.  Negative for environmental allergies.  Neurological: Positive for weakness (generalized) and headaches. Negative for dizziness and light-headedness.  Psychiatric/Behavioral: Positive for sleep disturbance (Sleep disturbed by cough.).       Objective:   Physical Exam  Constitutional: She is oriented to person, place, and time. She appears well-developed and well-nourished.  BP 124/72   Pulse 93   Temp 98.8 F (37.1 C) (Oral)   Resp 18   Ht 5\' 7"  (1.702 m)   Wt 169 lb 12.8 oz (77 kg)   SpO2 97%   BMI 26.59 kg/m   HENT:  Head: Normocephalic and atraumatic.  Right Ear: External ear normal.  Left Ear: External ear normal.  Mucus noted between injected nasal turbinates. Throat slightly erythematous.   Eyes: Conjunctivae and EOM are normal. Pupils are equal, round, and reactive to light.  Eyes watery.  Neck: Normal range of motion. Neck supple. Thyromegaly present.  Cardiovascular: Normal rate, regular rhythm, normal heart sounds and intact distal  pulses. Exam reveals no gallop and no friction rub.  No murmur heard. Pulmonary/Chest: Effort normal and breath sounds normal.  Neurological: She is alert and oriented to person, place, and time. She has normal reflexes.  Skin: Skin is warm and dry. No rash noted. No erythema. No pallor.  Psychiatric: She has a normal mood and affect. Her behavior is normal. Judgment and thought content normal.      Assessment & Plan:    Cough - Plan: benzonatate (TESSALON) 100 MG capsule, HYDROcodone-homatropine (HYCODAN) 5-1.5 MG/5ML syrup  Acute upper respiratory infection - Plan: ipratropium (ATROVENT) 0.06 % nasal spray   Symptomatic control of symptoms d/w pt.  Add above medications to help.  Windell Hummingbird PA-C  [facility] 08/22/2017 12:41 PM

## 2017-09-22 ENCOUNTER — Encounter: Payer: Self-pay | Admitting: Physician Assistant

## 2017-09-30 ENCOUNTER — Ambulatory Visit: Payer: BLUE CROSS/BLUE SHIELD | Admitting: Physician Assistant

## 2017-10-06 ENCOUNTER — Encounter: Payer: Self-pay | Admitting: Physician Assistant

## 2017-10-06 ENCOUNTER — Ambulatory Visit: Payer: BLUE CROSS/BLUE SHIELD | Admitting: Physician Assistant

## 2017-10-06 ENCOUNTER — Other Ambulatory Visit: Payer: Self-pay

## 2017-10-06 VITALS — BP 130/90 | HR 73 | Temp 97.4°F | Resp 16 | Ht 67.0 in | Wt 169.2 lb

## 2017-10-06 DIAGNOSIS — J302 Other seasonal allergic rhinitis: Secondary | ICD-10-CM

## 2017-10-06 DIAGNOSIS — E010 Iodine-deficiency related diffuse (endemic) goiter: Secondary | ICD-10-CM | POA: Diagnosis not present

## 2017-10-06 DIAGNOSIS — R062 Wheezing: Secondary | ICD-10-CM

## 2017-10-06 DIAGNOSIS — I10 Essential (primary) hypertension: Secondary | ICD-10-CM

## 2017-10-06 DIAGNOSIS — E785 Hyperlipidemia, unspecified: Secondary | ICD-10-CM

## 2017-10-06 MED ORDER — ALBUTEROL SULFATE HFA 108 (90 BASE) MCG/ACT IN AERS: INHALATION_SPRAY | RESPIRATORY_TRACT | 0 refills | Status: DC

## 2017-10-06 NOTE — Assessment & Plan Note (Signed)
Well controlled. Continue amlodipine. Encouraged smoking cessation.

## 2017-10-06 NOTE — Assessment & Plan Note (Signed)
Update thyroid US.

## 2017-10-06 NOTE — Assessment & Plan Note (Addendum)
Has been well controlled on atorvastatin 40 mg. Update labs today and adjust if needed.

## 2017-10-06 NOTE — Progress Notes (Signed)
Patient ID: Christina [name], female    DOB: [birth year], 72 y.o.   MRN: [id number]  PCP: Harrison Mons, PA-C  Chief Complaint  Patient presents with  . Hypertension    Subjective:   Presents for evaluation of HTN.  She is doing well, no problems or concerns. Continues to smoke. Increased allergy symptoms with the weather change.  No CP, SOB, HA, dizziness. Cough is unchanged.  Paper record reviewed regarding thyromegaly. Last [place] 2007 revealed a dominant nodule in the LEFT lobe, slightly increased since previous study. Biopsy recommended only if the nodule was palpable. She was referred to endocrinology.   Review of Systems  Constitutional: Negative.   HENT: Positive for postnasal drip, rhinorrhea and sneezing. Negative for sore throat.   Eyes: Positive for itching. Negative for visual disturbance.  Respiratory: Negative for cough, chest tightness, shortness of breath and wheezing.   Cardiovascular: Negative for chest pain and palpitations.  Gastrointestinal: Negative for abdominal pain, diarrhea, nausea and vomiting.  Endocrine: Negative for cold intolerance, heat intolerance, polydipsia, polyphagia and polyuria.  Genitourinary: Negative for dysuria, frequency, hematuria and urgency.  Musculoskeletal: Negative for arthralgias and myalgias.  Skin: Negative for rash.  Allergic/Immunologic: Positive for environmental allergies.  Neurological: Negative for dizziness, weakness and headaches.  Psychiatric/Behavioral: Negative for decreased concentration. The patient is not nervous/anxious.        Patient Active Problem List   Diagnosis Date Noted  . Thyromegaly 07/01/2017  . Colon polyps 04/02/2017  . Former smoker 09/27/2016  . Cough 04/25/2016  . 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 GERD (gastroesophageal reflux disease) 08/10/2013  . Hyperlipidemia 06/23/2013  . HTN (hypertension) 07/23/2012     Prior to Admission medications   Medication Sig  Start Date End Date Taking? Authorizing Provider  albuterol (PROAIR HFA) 108 (90 Base) MCG/ACT inhaler INHALE 2 PUFFS INTO THE LUNGS EVERY 6 HOURS AS NEEDED FOR WHEEZING OR SHORTNESS OF BREATH 07/01/17   Laiyla Slagel, PA-C  amLODipine (NORVASC) 10 MG tablet Take 1 tablet (10 mg total) by mouth daily. 12/07/16   McVey, Gelene Mink, PA-C  aspirin 81 MG tablet Take 81 mg by mouth daily.    [provider]  atorvastatin (LIPITOR) 40 MG tablet TAKE 1 TABLET(40 MG) BY MOUTH DAILY 12/07/16   McVey, Gelene Mink, PA-C  HYDROcodone-homatropine [facility]) 5-1.5 MG/5ML syrup Take 5 mLs by mouth every 8 (eight) hours as needed for cough. Patient not taking: Reported on 10/06/2017 08/22/17   Windell Hummingbird L, PA-C  ipratropium (ATROVENT) 0.06 % nasal spray Place 2 sprays into the nose 3 (three) times daily. 08/22/17   Weber, Damaris Hippo, PA-C  omeprazole (PRILOSEC) 40 MG capsule Take 1 capsule (40 mg total) by mouth daily. 07/01/17   Harrison Mons, PA-C  sucralfate (CARAFATE) 1 g tablet TAKE 1 TABLET BY MOUTH 1 HOUR BEFORE MEALS AND AT BEDTIME AS NEEDED 04/02/17   Harrison Mons, PA-C     Allergies  Allergen Reactions  . Coconut Oil     HIVES  . Codeine Other (See Comments)    Upsets stomach really bad  . Shrimp [Shellfish Allergy] Hives  . Trandolapril Swelling    Swelling of the face/lips 07/2014       Objective:  Physical Exam  Constitutional: She is oriented to person, place, and time. She appears well-developed and well-nourished. She is active and cooperative. No distress.  BP 130/90   Pulse 73   Temp (!) 97.4 F (36.3 C)   Resp 16  Ht 5\' 7"  (1.702 m)   Wt 169 lb 3.2 oz (76.7 kg)   SpO2 96%   BMI 26.50 kg/m   HENT:  Head: Normocephalic and atraumatic.  Right Ear: Hearing normal.  Left Ear: Hearing normal.  Eyes: Conjunctivae are normal. No scleral icterus.  Neck: Normal range of motion and phonation normal. Neck supple. No thyroid mass and no thyromegaly present.    Cardiovascular: Normal rate, regular rhythm and normal heart sounds.  Pulses:      Radial pulses are 2+ on the right side, and 2+ on the left side.  Pulmonary/Chest: Effort normal and breath sounds normal.  Lymphadenopathy:       Head (right side): No tonsillar, no preauricular, no posterior auricular and no occipital adenopathy present.       Head (left side): No tonsillar, no preauricular, no posterior auricular and no occipital adenopathy present.    She has no cervical adenopathy.       Right: No supraclavicular adenopathy present.       Left: No supraclavicular adenopathy present.  Neurological: She is alert and oriented to person, place, and time. No sensory deficit.  Skin: Skin is warm, dry and intact. No rash noted. No cyanosis or erythema. Nails show no clubbing.  Psychiatric: She has a normal mood and affect. Her speech is normal and behavior is normal.    Wt Readings from Last 3 Encounters:  10/06/17 169 lb 3.2 oz (76.7 kg)  08/22/17 169 lb 12.8 oz (77 kg)  07/01/17 170 lb 3.2 oz (77.2 kg)       Assessment & Plan:   Problem List Items Addressed This Visit    HTN (hypertension) - Primary    Well controlled. Continue amlodipine. Encouraged smoking cessation.      Hyperlipidemia    Has been well controlled on atorvastatin 40 mg. Update labs today and adjust if needed.      Thyromegaly    Update thyroid US.      Relevant Orders   US THYROID   Seasonal allergies    Other Visit Diagnoses    Expiratory wheezing       Asymptomatic. Uses albuterol PRN.   Relevant Medications   albuterol (PROAIR HFA) 108 (90 Base) MCG/ACT inhaler       Return in about 6 months (around 04/07/2018) for blood pressure, cholesterol and thyroid re-evaluation.   Fara Chute, PA-C [facility]

## 2017-10-06 NOTE — Patient Instructions (Signed)
     IF you received an x-ray today, you will receive an invoice from [facility]. Please contact [facility] at [phone] with questions or concerns regarding your invoice.   IF you received labwork today, you will receive an invoice from [facility]. Please contact [facility] at [phone] with questions or concerns regarding your invoice.   Our billing staff will not be able to assist you with questions regarding bills from these companies.  You will be contacted with the lab results as soon as they are available. The fastest way to get your results is to activate your My Chart account. Instructions are located on the last page of this paperwork. If you have not heard from us regarding the results in 2 weeks, please contact this office.     

## 2017-10-10 ENCOUNTER — Telehealth: Payer: Self-pay | Admitting: Physician Assistant

## 2017-10-10 NOTE — Telephone Encounter (Signed)
Copied from [place] [id number]. Topic: Quick Communication - See Telephone Encounter >> Oct 10, 2017 11:12 AM Ether Griffins B wrote: CRM for notification. See Telephone encounter for: 10/10/17.  Pt states she went to pharmacy to pick up her albuterol (PROAIR HFA) 108 (90 Base) MCG/ACT inhaler. Her insurance will not cover a 30 day supply and it will cost her $95.00  for the inhaler. But they will cover a 90 day supply. She is hoping she can have a 90 day supply called in.

## 2017-10-10 NOTE — Telephone Encounter (Signed)
Ok to change

## 2017-10-11 NOTE — Telephone Encounter (Signed)
Yes, ok to change to 90-day supply

## 2017-10-13 NOTE — Telephone Encounter (Signed)
Phone call to [facility] on [address], 90 day supply of albuterol authorized.   Phone call to patient, notified of the above.

## 2017-10-17 ENCOUNTER — Other Ambulatory Visit: Payer: BLUE CROSS/BLUE SHIELD

## 2017-11-03 ENCOUNTER — Other Ambulatory Visit: Payer: BLUE CROSS/BLUE SHIELD

## 2017-11-14 ENCOUNTER — Other Ambulatory Visit: Payer: BLUE CROSS/BLUE SHIELD

## 2018-01-30 ENCOUNTER — Other Ambulatory Visit: Payer: Self-pay | Admitting: Physician Assistant

## 2018-01-30 DIAGNOSIS — E785 Hyperlipidemia, unspecified: Secondary | ICD-10-CM

## 2018-01-30 NOTE — Telephone Encounter (Signed)
Lipitor  refill Last Refill:12/07/16 # 90 Last OV: 10/06/17 with Harrison Mons PCP: Nolon Rod

## 2018-02-03 ENCOUNTER — Other Ambulatory Visit: Payer: Self-pay | Admitting: *Deleted

## 2018-02-03 ENCOUNTER — Telehealth: Payer: Self-pay | Admitting: Family Medicine

## 2018-02-03 DIAGNOSIS — E785 Hyperlipidemia, unspecified: Secondary | ICD-10-CM

## 2018-02-03 MED ORDER — ATORVASTATIN CALCIUM 40 MG PO TABS: ORAL_TABLET | ORAL | 0 refills | Status: DC

## 2018-02-03 NOTE — Telephone Encounter (Unsigned)
Copied from [place] [id number]. Topic: General - Other >> Feb 03, 2018 10:10 AM Judyann Munson wrote: Reason for CRM: patient is calling to advise she needs a 90 day supply of atorvastatin (LIPITOR) 40 MG tablet in order for her insurance to cover the medication. She has a appt schedule with Dr.stalling on 02-24-18. Please advise

## 2018-02-03 NOTE — Telephone Encounter (Signed)
Prescription sent

## 2018-02-06 NOTE — Telephone Encounter (Signed)
If at all possible pt may need a phone call to have explained why she cannot have the full 90 day supply until she can come in for an OV; if not the case pt is needing a 90 day supply in order for insurance to cover medication refill; contact to advise

## 2018-02-09 ENCOUNTER — Ambulatory Visit: Payer: BLUE CROSS/BLUE SHIELD | Admitting: Family Medicine

## 2018-02-09 ENCOUNTER — Other Ambulatory Visit: Payer: Self-pay | Admitting: *Deleted

## 2018-02-09 DIAGNOSIS — E785 Hyperlipidemia, unspecified: Secondary | ICD-10-CM

## 2018-02-09 MED ORDER — ATORVASTATIN CALCIUM 40 MG PO TABS: ORAL_TABLET | ORAL | 0 refills | Status: DC

## 2018-02-09 NOTE — Progress Notes (Deleted)
No chief complaint on file.   HPI  4 review of systems  Past Medical History:  Diagnosis Date  . Allergy   . GERD (gastroesophageal reflux disease)   . Hyperlipidemia   . Hypertension   . Reflux   . Tubulovillous adenoma of colon 2003    Current Outpatient Medications  Medication Sig Dispense Refill  . albuterol (PROAIR HFA) 108 (90 Base) MCG/ACT inhaler INHALE 2 PUFFS INTO THE LUNGS EVERY 6 HOURS AS NEEDED FOR WHEEZING OR SHORTNESS OF BREATH 8.5 g 0  . amLODipine (NORVASC) 10 MG tablet Take 1 tablet (10 mg total) by mouth daily. 90 tablet 4  . aspirin 81 MG tablet Take 81 mg by mouth daily.    Marland Kitchen atorvastatin (LIPITOR) 40 MG tablet Take 1 tablet 40 mg by mouth daily 30 tablet 0  . ipratropium (ATROVENT) 0.06 % nasal spray Place 2 sprays into the nose 3 (three) times daily. 15 mL 0  . omeprazole (PRILOSEC) 40 MG capsule Take 1 capsule (40 mg total) by mouth daily. 90 capsule 3  . sucralfate (CARAFATE) 1 g tablet TAKE 1 TABLET BY MOUTH 1 HOUR BEFORE MEALS AND AT BEDTIME AS NEEDED 120 tablet prn   No current facility-administered medications for this visit.     Allergies:  Allergies  Allergen Reactions  . Coconut Oil     HIVES  . Codeine Other (See Comments)    Upsets stomach really bad  . Shrimp [Shellfish Allergy] Hives  . Trandolapril Swelling    Swelling of the face/lips 07/2014    Past Surgical History:  Procedure Laterality Date  . CESAREAN SECTION     1 time  . COLONOSCOPY      Social History   Socioeconomic History  . Marital status: Married    Spouse name: [name]  . Number of children: 1  . Years of education: 12th grade  . Highest education level: Not on file  Occupational History  . Occupation: school housekeeping    Comment: part-time  Social Needs  . Financial resource strain: Not on file  . Food insecurity:    Worry: Not on file    Inability: Not on file  . Transportation needs:    Medical: Not on file    Non-medical: Not on file    Tobacco Use  . Smoking status: Former Smoker    Packs/day: 0.50    Years: 4.00    Pack years: 2.00    Types: Cigarettes  . Smokeless tobacco: Never Used  . Tobacco comment: Previously quit in 2015 but has started smoking again   Substance and Sexual Activity  . Alcohol use: No    Alcohol/week: 0.0 standard drinks  . Drug use: No  . Sexual activity: Never  Lifestyle  . Physical activity:    Days per week: Not on file    Minutes per session: Not on file  . Stress: Not on file  Relationships  . Social connections:    Talks on phone: Not on file    Gets together: Not on file    Attends religious service: Not on file    Active member of club or organization: Not on file    Attends meetings of clubs or organizations: Not on file    Relationship status: Not on file  Other Topics Concern  . Not on file  Social History Narrative   Lives with her husband. Her daughter also lives in [place] with her husband and 3 children.    Family History  Problem Relation Age of Onset  . Hypertension Mother   . Heart disease Mother   . Leukemia Father   . Cancer Father   . Hypertension Sister   . Hypertension Sister      ROS Review of Systems See HPI Constitution: No fevers or chills No malaise No diaphoresis Skin: No rash or itching Eyes: no blurry vision, no double vision GU: no dysuria or hematuria Neuro: no dizziness or headaches * all others reviewed and negative   Objective: There were no vitals filed for this visit.  Physical Exam  Assessment and Plan There are no diagnoses linked to this encounter.   Christina [name] [employer]

## 2018-02-11 ENCOUNTER — Other Ambulatory Visit: Payer: Self-pay

## 2018-02-11 ENCOUNTER — Ambulatory Visit: Payer: BLUE CROSS/BLUE SHIELD | Admitting: Family Medicine

## 2018-02-11 ENCOUNTER — Encounter: Payer: Self-pay | Admitting: Family Medicine

## 2018-02-11 VITALS — BP 142/70 | HR 92 | Temp 98.6°F | Ht 68.0 in | Wt 174.2 lb

## 2018-02-11 DIAGNOSIS — Z131 Encounter for screening for diabetes mellitus: Secondary | ICD-10-CM

## 2018-02-11 DIAGNOSIS — I10 Essential (primary) hypertension: Secondary | ICD-10-CM | POA: Diagnosis not present

## 2018-02-11 DIAGNOSIS — E7800 Pure hypercholesterolemia, unspecified: Secondary | ICD-10-CM

## 2018-02-11 NOTE — Patient Instructions (Addendum)
If you have lab work done today you will be contacted with your lab results within the next 2 weeks.  If you have not heard from [place] then please contact us. The fastest way to get your results is to register for My Chart.   IF you received an x-ray today, you will receive an invoice from [facility]. Please contact [facility] at [phone] with questions or concerns regarding your invoice.   IF you received labwork today, you will receive an invoice from [place]. Please contact [facility] at [phone] with questions or concerns regarding your invoice.   Our billing staff will not be able to assist you with questions regarding bills from these companies.  You will be contacted with the lab results as soon as they are available. The fastest way to get your results is to activate your My Chart account. Instructions are located on the last page of this paperwork. If you have not heard from [place] regarding the results in 2 weeks, please contact this office.      Heart Disease Prevention Heart disease is a leading cause of death. There are many things you can do to help prevent heart disease. Be physically active Physical activity is good for your heart. It helps control your blood pressure, cholesterol levels, and weight. Try to be physically active every day. Ask your health care provider what activities are best for you. Be a healthy weight Extra weight can strain your heart and affect your blood pressure and cholesterol levels. Lose weight with diet and exercise if recommended by your health care provider. Eat heart-healthy foods Follow a healthy eating plan as recommended by your health care provider or dietitian. Heart-healthy foods include:  High-fiber foods. These include oat bran, oatmeal, and whole-grain breads and cereals.  Fruits and vegetables.  Avoid:  Alcohol.  Fried foods.  Foods high in saturated fat. These include meats, butter, whole dairy  products, shortening, and coconut or palm oil.  Salty foods. These include canned food, luncheon meat, salty snacks, and fast food.  Keep your cholesterol levels under control Cholesterol is a substance that is used for many important functions. When your cholesterol levels are high, cholesterol can stick to the insides of your blood vessels, making them narrow or clog. This can lead to chest pain (angina) and a heart attack. Keep your cholesterol levels under control as recommended by your health care provider. Have your cholesterol checked at least once a year. Target cholesterol levels (in mg/dL) for most people are:  Total cholesterol below 200.  LDL cholesterol below 100.  HDL cholesterol above 40 in men and above 50 in women.  Triglycerides below 150.  Keep your blood pressure under control Having high blood pressure (hypertension) puts you at risk for stroke and other forms of heart disease. Keep your blood pressure under control as recommended by your health care provider. Ask your health care provider if you need treatment to lower your blood pressure. If you are 17-110 years of age, have your blood pressure checked every 3-5 years. If you are 10 years of age or older, have your blood pressure checked every year. Do not use tobacco products Tobacco smoke can damage your heart and blood vessels. Do not use any tobacco products including cigarettes, chewing tobacco, or electronic cigarettes. If you need help quitting, ask your health care provider. Take medicines as directed Take medicines only as directed by your health care provider. Ask your health care provider whether you  should take an aspirin every day. Taking aspirin can help reduce your risk of heart disease and stroke. Where to find more information: To find out more about heart disease, visit the American Heart Association's website at www.americanheart.org This information is not intended to replace advice given to you by  your health care provider. Make sure you discuss any questions you have with your health care provider. Document Released: 01/16/2004 Document Revised: 11/01/2015 Document Reviewed: 07/28/2013 Elsevier Interactive Patient Education  [employer].

## 2018-02-11 NOTE — Progress Notes (Signed)
Chief Complaint  Patient presents with  . Hypertension  . Medication Refill    BP meds    HPI   Asthma Pt has well controlled asthma Her triggers are pollens and dust She has not had an asthma in years She does not smoke She takes her flu vaccines  Hypertension: Patient here for follow-up of elevated blood pressure. She is exercising and is adherent to low salt diet.  Blood pressure is well controlled at home. Cardiac symptoms none. Patient denies chest pain, chest pressure/discomfort, claudication, exertional chest pressure/discomfort, fatigue, irregular heart beat, lower extremity edema and near-syncope.  Cardiovascular risk factors: dyslipidemia and hypertension. Use of agents associated with hypertension: none. History of target organ damage: none. BP Readings from Last 3 Encounters:  02/11/18 (!) 142/70  10/06/17 130/90  08/22/17 124/72    Hyperlipidemia: Patient presents with hyperlipidemia. She eats well and exercises. She is a nonsmoker. There is a family history of early ischemia heart disease. Her mother had a aneurysm.   The 10-year ASCVD risk score Mikey Bussing DC Jr., et al., 2013) is: 11.5%   Values used to calculate the score:     Age: 72 years     Sex: Female     Is Non-Hispanic African American: Yes     Diabetic: No     Tobacco smoker: No     Systolic Blood Pressure: 338 mmHg     Is BP treated: Yes     HDL Cholesterol: 56 mg/dL     Total Cholesterol: 154 mg/dL  Lab Results  Component Value Date   CHOL 154 04/02/2017   CHOL 170 12/07/2016   CHOL 152 03/22/2016   Lab Results  Component Value Date   HDL 56 04/02/2017   HDL 54 12/07/2016   HDL 55 03/22/2016   Lab Results  Component Value Date   LDLCALC 83 04/02/2017   LDLCALC 97 12/07/2016   LDLCALC 77 03/22/2016   Lab Results  Component Value Date   TRIG 76 04/02/2017   TRIG 93 12/07/2016   TRIG 98 03/22/2016   Lab Results  Component Value Date   CHOLHDL 2.8 04/02/2017   CHOLHDL 3.1 12/07/2016    CHOLHDL 2.8 03/22/2016     Past Medical History:  Diagnosis Date  . Allergy   . GERD (gastroesophageal reflux disease)   . Hyperlipidemia   . Hypertension   . Reflux   . Tubulovillous adenoma of colon 2003    Current Outpatient Medications  Medication Sig Dispense Refill  . albuterol (PROAIR HFA) 108 (90 Base) MCG/ACT inhaler INHALE 2 PUFFS INTO THE LUNGS EVERY 6 HOURS AS NEEDED FOR WHEEZING OR SHORTNESS OF BREATH 8.5 g 0  . amLODipine (NORVASC) 10 MG tablet Take 1 tablet (10 mg total) by mouth daily. 90 tablet 4  . aspirin 81 MG tablet Take 81 mg by mouth daily.    Marland Kitchen atorvastatin (LIPITOR) 40 MG tablet Take 1 tablet 40 mg by mouth daily 90 tablet 0  . omeprazole (PRILOSEC) 40 MG capsule Take 1 capsule (40 mg total) by mouth daily. 90 capsule 3  . sucralfate (CARAFATE) 1 g tablet TAKE 1 TABLET BY MOUTH 1 HOUR BEFORE MEALS AND AT BEDTIME AS NEEDED 120 tablet prn   No current facility-administered medications for this visit.     Allergies:  Allergies  Allergen Reactions  . Coconut Oil     HIVES  . Codeine Other (See Comments)    Upsets stomach really bad  . Shrimp [Shellfish Allergy] Hives  .  Trandolapril Swelling    Swelling of the face/lips 07/2014    Past Surgical History:  Procedure Laterality Date  . CESAREAN SECTION     1 time  . COLONOSCOPY      Social History   Socioeconomic History  . Marital status: Married    Spouse name: [name]  . Number of children: 1  . Years of education: 12th grade  . Highest education level: Not on file  Occupational History  . Occupation: school housekeeping    Comment: part-time  Social Needs  . Financial resource strain: Not on file  . Food insecurity:    Worry: Not on file    Inability: Not on file  . Transportation needs:    Medical: Not on file    Non-medical: Not on file  Tobacco Use  . Smoking status: Former Smoker    Packs/day: 0.50    Years: 4.00    Pack years: 2.00    Types: Cigarettes  . Smokeless  tobacco: Never Used  . Tobacco comment: Previously quit in 2015 but has started smoking again   Substance and Sexual Activity  . Alcohol use: No    Alcohol/week: 0.0 standard drinks  . Drug use: No  . Sexual activity: Never  Lifestyle  . Physical activity:    Days per week: Not on file    Minutes per session: Not on file  . Stress: Not on file  Relationships  . Social connections:    Talks on phone: Not on file    Gets together: Not on file    Attends religious service: Not on file    Active member of club or organization: Not on file    Attends meetings of clubs or organizations: Not on file    Relationship status: Not on file  Other Topics Concern  . Not on file  Social History Narrative   Lives with her husband. Her daughter also lives in [place] with her husband and 3 children.    Family History  Problem Relation Age of Onset  . Hypertension Mother   . Heart disease Mother   . Leukemia Father   . Cancer Father   . Hypertension Sister   . Hypertension Sister      ROS Review of Systems See HPI Constitution: No fevers or chills No malaise No diaphoresis Skin: No rash or itching Eyes: no blurry vision, no double vision GU: no dysuria or hematuria Neuro: no dizziness or headaches all others reviewed and negative   Objective: Vitals:   02/11/18 0956  BP: (!) 142/70  Pulse: 92  Temp: 98.6 F (37 C)  TempSrc: Oral  SpO2: 96%  Weight: 174 lb 3.2 oz (79 kg)  Height: 5\' 8"  (1.727 m)    Physical Exam  Constitutional: She is oriented to person, place, and time. She appears well-developed and well-nourished.  HENT:  Head: Normocephalic and atraumatic.  Eyes: Conjunctivae and EOM are normal.  Neck: Normal range of motion. Neck supple.  Cardiovascular: Normal rate, regular rhythm and normal heart sounds.  No murmur heard. Pulmonary/Chest: Effort normal and breath sounds normal. No stridor. No respiratory distress.  Abdominal: Soft. Bowel sounds are  normal. She exhibits no distension and no mass. There is no tenderness. There is no guarding.  Neurological: She is alert and oriented to person, place, and time.  Skin: Skin is warm. Capillary refill takes less than 2 seconds.  Psychiatric: She has a normal mood and affect. Her behavior is normal. Judgment and thought content  normal.    Assessment and Plan Christina [name] was seen today for hypertension and medication refill.  Diagnoses and all orders for this visit:  Essential hypertension- Patient's blood pressure is at goal of 139/89 or less. Condition is stable. Continue current medications and treatment plan. I recommend that you exercise for 30-45 minutes 5 days a week. I also recommend a balanced diet with fruits and vegetables every day, lean meats, and little fried foods. The DASH diet (you can find this online) is a good example of this.  -     Lipid panel; Future -     Comprehensive metabolic panel; Future  Pure hypercholesterolemia- discussed cholesterol mgmt, will check levels today -     Lipid panel; Future -     Comprehensive metabolic panel; Future  Screening for diabetes mellitus -     Hemoglobin A1c; Future     Euell Schiff A Nolon Rod

## 2018-02-16 ENCOUNTER — Other Ambulatory Visit: Payer: Self-pay | Admitting: Physician Assistant

## 2018-02-16 DIAGNOSIS — I10 Essential (primary) hypertension: Secondary | ICD-10-CM

## 2018-02-24 ENCOUNTER — Ambulatory Visit: Payer: BLUE CROSS/BLUE SHIELD | Admitting: Family Medicine

## 2018-05-19 ENCOUNTER — Ambulatory Visit: Payer: BLUE CROSS/BLUE SHIELD | Admitting: Family Medicine

## 2018-05-19 ENCOUNTER — Other Ambulatory Visit: Payer: Self-pay | Admitting: Family Medicine

## 2018-05-19 DIAGNOSIS — K219 Gastro-esophageal reflux disease without esophagitis: Secondary | ICD-10-CM

## 2018-05-20 ENCOUNTER — Other Ambulatory Visit: Payer: Self-pay | Admitting: *Deleted

## 2018-05-20 DIAGNOSIS — K219 Gastro-esophageal reflux disease without esophagitis: Secondary | ICD-10-CM

## 2018-05-20 MED ORDER — SUCRALFATE 1 G PO TABS: ORAL_TABLET | ORAL | 99 refills | Status: DC

## 2018-05-20 NOTE — Progress Notes (Signed)
Requested Prescriptions  Pending Prescriptions Disp Refills  . sucralfate (CARAFATE) 1 g tablet 120 tablet prn    Sig: TAKE 1 TABLET BY MOUTH 1 HOUR BEFORE MEALS AND AT BEDTIME AS NEEDED     There is no refill protocol information for this order

## 2018-05-25 ENCOUNTER — Ambulatory Visit: Payer: BLUE CROSS/BLUE SHIELD | Admitting: Family Medicine

## 2018-05-25 ENCOUNTER — Other Ambulatory Visit: Payer: Self-pay

## 2018-05-25 ENCOUNTER — Encounter: Payer: Self-pay | Admitting: Family Medicine

## 2018-05-25 VITALS — BP 143/89 | HR 90 | Temp 98.6°F | Resp 17 | Ht 68.0 in | Wt 175.8 lb

## 2018-05-25 DIAGNOSIS — J069 Acute upper respiratory infection, unspecified: Secondary | ICD-10-CM | POA: Diagnosis not present

## 2018-05-25 DIAGNOSIS — I10 Essential (primary) hypertension: Secondary | ICD-10-CM | POA: Diagnosis not present

## 2018-05-25 MED ORDER — GUAIFENESIN ER 600 MG PO TB12: 1200.0000 mg | ORAL_TABLET | Freq: Two times a day (BID) | ORAL | 3 refills | Status: DC

## 2018-05-25 MED ORDER — FLUTICASONE PROPIONATE 50 MCG/ACT NA SUSP: 2.0000 | Freq: Every day | NASAL | 6 refills | Status: DC

## 2018-05-25 NOTE — Progress Notes (Signed)
Established Patient Office Visit  Subjective:  Patient ID: Christina [name], female    DOB: 1945-09-24  Age: 72 y.o. MRN: [id number]  CC:  Chief Complaint  Patient presents with  . cough/head and nasal congestiong x 2 weeks, no mucus drainag    Tried sudafed with no help    HPI Christina [name] presents for 2 weeks history of cough Reports that she has been feeling like her head is light  She took sudafed  She has nasal congestion She has no mucus drainage Her cough is nonproductive She states that she also has nasal congestion She also tried chloracetin She denies nausea, vomiting She has been having diarrhea, starting yesterday with 2 episodes in a day  Pt has history of hypertension She reports that she is taking her amlodipine She is sticking to her low sodium diet She denies chest pains or palpitations BP Readings from Last 3 Encounters:  05/25/18 (!) 143/89  02/11/18 (!) 142/70  10/06/17 130/90      Past Medical History:  Diagnosis Date  . Allergy   . GERD (gastroesophageal reflux disease)   . Hyperlipidemia   . Hypertension   . Reflux   . Tubulovillous adenoma of colon 2003    Past Surgical History:  Procedure Laterality Date  . CESAREAN SECTION     1 time  . COLONOSCOPY      Family History  Problem Relation Age of Onset  . Hypertension Mother   . Heart disease Mother   . Leukemia Father   . Cancer Father   . Hypertension Sister   . Hypertension Sister     Social History   Socioeconomic History  . Marital status: Married    Spouse name: [name]  . Number of children: 1  . Years of education: 12th grade  . Highest education level: Not on file  Occupational History  . Occupation: school housekeeping    Comment: part-time  Social Needs  . Financial resource strain: Not on file  . Food insecurity:    Worry: Not on file    Inability: Not on file  . Transportation needs:    Medical: Not on file    Non-medical: Not on file  Tobacco Use   . Smoking status: Former Smoker    Packs/day: 0.50    Years: 4.00    Pack years: 2.00    Types: Cigarettes  . Smokeless tobacco: Never Used  . Tobacco comment: Previously quit in 2015 but has started smoking again   Substance and Sexual Activity  . Alcohol use: No    Alcohol/week: 0.0 standard drinks  . Drug use: No  . Sexual activity: Never  Lifestyle  . Physical activity:    Days per week: Not on file    Minutes per session: Not on file  . Stress: Not on file  Relationships  . Social connections:    Talks on phone: Not on file    Gets together: Not on file    Attends religious service: Not on file    Active member of club or organization: Not on file    Attends meetings of clubs or organizations: Not on file    Relationship status: Not on file  . Intimate partner violence:    Fear of current or ex partner: Not on file    Emotionally abused: Not on file    Physically abused: Not on file    Forced sexual activity: Not on file  Other Topics Concern  .  Not on file  Social History Narrative   Lives with her husband. Her daughter also lives in [place] with her husband and 3 children.    Outpatient Medications Prior to Visit  Medication Sig Dispense Refill  . albuterol (PROAIR HFA) 108 (90 Base) MCG/ACT inhaler INHALE 2 PUFFS INTO THE LUNGS EVERY 6 HOURS AS NEEDED FOR WHEEZING OR SHORTNESS OF BREATH 8.5 g 0  . amLODipine (NORVASC) 10 MG tablet TAKE 1 TABLET(10 MG) BY MOUTH DAILY 90 tablet 1  . aspirin 81 MG tablet Take 81 mg by mouth daily.    Marland Kitchen atorvastatin (LIPITOR) 40 MG tablet Take 1 tablet 40 mg by mouth daily 90 tablet 0  . omeprazole (PRILOSEC) 40 MG capsule Take 1 capsule (40 mg total) by mouth daily. 90 capsule 3  . sucralfate (CARAFATE) 1 g tablet TAKE 1 TABLET BY MOUTH 1 HOUR BEFORE MEALS AND AT BEDTIME AS NEEDED 120 tablet prn   No facility-administered medications prior to visit.     Allergies  Allergen Reactions  . Coconut Oil     HIVES  . Codeine  Other (See Comments)    Upsets stomach really bad  . Shrimp [Shellfish Allergy] Hives  . Trandolapril Swelling    Swelling of the face/lips 07/2014    ROS Review of Systems Review of Systems  Constitutional: Negative for activity change, appetite change, chills and fever.  HENT: Negative for congestion, nosebleeds, trouble swallowing and voice change.   Respiratory: see hpi  Gastrointestinal: see hpi Genitourinary: Negative for difficulty urinating, dysuria, flank pain and hematuria.  Musculoskeletal: Negative for back pain, joint swelling and neck pain.  Neurological: Negative for dizziness, speech difficulty, light-headedness and numbness.  See HPI. All other review of systems negative.     Objective:    Physical Exam  BP (!) 143/89 (BP Location: Right Arm, Patient Position: Sitting, Cuff Size: Large)   Pulse 90   Temp 98.6 F (37 C) (Oral)   Resp 17   Ht 5\' 8"  (1.727 m)   Wt 175 lb 12.8 oz (79.7 kg)   SpO2 99%   BMI 26.73 kg/m  Wt Readings from Last 3 Encounters:  05/25/18 175 lb 12.8 oz (79.7 kg)  02/11/18 174 lb 3.2 oz (79 kg)  10/06/17 169 lb 3.2 oz (76.7 kg)   General: alert, oriented, in NAD Head: normocephalic, atraumatic, no sinus tenderness Eyes: EOM intact, no scleral icterus or conjunctival injection Ears: TM clear bilaterally Nose: mucosa nonerythematous, nonedematous Throat: no pharyngeal exudate or erythema Lymph: no posterior auricular, submental or cervical lymph adenopathy Heart: normal rate, normal sinus rhythm, no murmurs Lungs: clear to auscultation bilaterally, no wheezing    Health Maintenance Due  Topic Date Due  . INFLUENZA VACCINE  01/15/2018    There are no preventive care reminders to display for this patient.  Lab Results  Component Value Date   TSH 1.240 04/02/2017   Lab Results  Component Value Date   WBC 5.9 04/02/2017   HGB 12.9 04/02/2017   HCT 39.7 04/02/2017   MCV 89 04/02/2017   PLT 336 04/02/2017   Lab Results   Component Value Date   NA 144 04/02/2017   K 3.7 04/02/2017   CO2 25 04/02/2017   GLUCOSE 87 04/02/2017   BUN 11 04/02/2017   CREATININE 0.86 04/02/2017   BILITOT 0.4 04/02/2017   ALKPHOS 95 04/02/2017   AST 25 04/02/2017   ALT 21 04/02/2017   PROT 6.8 04/02/2017   ALBUMIN 4.2 04/02/2017  CALCIUM 9.4 04/02/2017   ANIONGAP 5 08/05/2014   Lab Results  Component Value Date   CHOL 154 04/02/2017   Lab Results  Component Value Date   HDL 56 04/02/2017   Lab Results  Component Value Date   LDLCALC 83 04/02/2017   Lab Results  Component Value Date   TRIG 76 04/02/2017   Lab Results  Component Value Date   CHOLHDL 2.8 04/02/2017   No results found for: HGBA1C    Assessment & Plan:   Problem List Items Addressed This Visit      Cardiovascular and Mediastinum   Essential hypertension    Continue amlodipine Avoid dextromorphan and phenylepherine in cough meds         Respiratory   Acute URI - Primary    Discussed viral etiology Discussed otc decongestant Advised flonase for congestion  Increase hydration Vitamin C and zinc lozenges suggested Return to clinic if symptoms worse          Meds ordered this encounter  Medications  . fluticasone (FLONASE) 50 MCG/ACT nasal spray    Sig: Place 2 sprays into both nostrils daily.    Dispense:  16 g    Refill:  6  . guaiFENesin (MUCINEX) 600 MG 12 hr tablet    Sig: Take 2 tablets (1,200 mg total) by mouth 2 (two) times daily.    Dispense:  30 tablet    Refill:  3    Follow-up: Return if symptoms worsen or fail to improve.    [name], MD

## 2018-05-25 NOTE — Assessment & Plan Note (Signed)
Discussed viral etiology Discussed otc decongestant Advised flonase for congestion  Increase hydration Vitamin C and zinc lozenges suggested Return to clinic if symptoms worse

## 2018-05-25 NOTE — Assessment & Plan Note (Signed)
Continue amlodipine Avoid dextromorphan and phenylepherine in cough meds

## 2018-05-25 NOTE — Patient Instructions (Addendum)
  If you have thyroid disease, diabetes, hypertension, tachycardia or seizure disorder If you take heart meds or ADD meds   You should not take Dextromorphan or Phenylephrine These are common found in decongestant and cold medications This can cause very high heart rate and high blood pressure.  Ask you pharmacist to make sure this ingredient is not present    If you have lab work done today you will be contacted with your lab results within the next 2 weeks.  If you have not heard from [place] then please contact us. The fastest way to get your results is to register for My Chart.   IF you received an x-ray today, you will receive an invoice from [facility] Rad[facility]. Please contact [facility]iology at [phone] with questions or concerns regarding your invoice.   IF you received labwork today, you will receive an invoice from [facility]. Please contact [facility] at [phone] with questions or concerns regarding your invoice.   Our billing staff will not be able to assist you with questions regarding bills from these companies.  You will be contacted with the lab results as soon as they are available. The fastest way to get your results is to activate your My Chart account. Instructions are located on the last page of this paperwork. If you have not heard from [place] regarding the results in 2 weeks, please contact this office.    We recommend that you schedule a mammogram for breast cancer screening. Typically, you do not need a referral to do this. Please contact a local imaging center to schedule your mammogram.  [facility] - [phone]  *ask for the Radiology Department [facility] ([place]) - [phone] or [phone]  [facility] - [phone] [place] [phone] [facility]  - [phone]  *ask for the [facility] - [phone]  *ask for the Radiology  Department [facility] - [phone]  *ask for the [facility] - [phone]) [phone] Mist Vaporizer A cool mist vaporizer is a device that releases a cool mist into the air. If you have a cough or a cold, using a vaporizer may help relieve your symptoms. The mist adds moisture to the air, which may help thin your mucus and make it less sticky. When your mucus is thin and less sticky, it easier for you to breathe and to cough up secretions. Do not use a vaporizer if you are allergic to mold. Follow these instructions at home:  Follow the instructions that come with the vaporizer.  Do not use anything other than distilled water in the vaporizer.  Do not run the vaporizer all of the time. Doing that can cause mold or bacteria to grow in the vaporizer.  Clean the vaporizer after each time that you use it.  Clean and dry the vaporizer well before storing it.  Stop using the vaporizer if your breathing symptoms get worse. This information is not intended to replace advice given to you by your health care provider. Make sure you discuss any questions you have with your health care provider. Document Released: 02/29/2004 Document Revised: 12/22/2015 Document Reviewed: 09/02/2015 Elsevier Interactive Patient Education  [employer].

## 2018-05-27 ENCOUNTER — Ambulatory Visit (INDEPENDENT_AMBULATORY_CARE_PROVIDER_SITE_OTHER): Payer: BLUE CROSS/BLUE SHIELD

## 2018-05-27 ENCOUNTER — Telehealth: Payer: Self-pay | Admitting: Family Medicine

## 2018-05-27 ENCOUNTER — Ambulatory Visit: Payer: BLUE CROSS/BLUE SHIELD | Admitting: Family Medicine

## 2018-05-27 ENCOUNTER — Other Ambulatory Visit: Payer: Self-pay

## 2018-05-27 ENCOUNTER — Encounter: Payer: Self-pay | Admitting: Family Medicine

## 2018-05-27 VITALS — BP 133/82 | HR 81 | Temp 97.8°F | Resp 20 | Ht 67.17 in | Wt 174.2 lb

## 2018-05-27 DIAGNOSIS — J069 Acute upper respiratory infection, unspecified: Secondary | ICD-10-CM

## 2018-05-27 DIAGNOSIS — R059 Cough, unspecified: Secondary | ICD-10-CM

## 2018-05-27 DIAGNOSIS — R05 Cough: Secondary | ICD-10-CM | POA: Diagnosis not present

## 2018-05-27 DIAGNOSIS — R911 Solitary pulmonary nodule: Secondary | ICD-10-CM | POA: Diagnosis not present

## 2018-05-27 LAB — POCT CBC
Granulocyte percent: 64 %G (ref 37–80)
HEMATOCRIT: 36.8 % (ref 29–41)
Hemoglobin: 12.6 g/dL (ref 9.5–13.5)
LYMPH, POC: 2.4 (ref 0.6–3.4)
MCH: 28.6 pg (ref 27–31.2)
MCHC: 34.1 g/dL (ref 31.8–35.4)
MCV: 83.9 fL (ref 76–111)
MID (cbc): 0.3 (ref 0–0.9)
MPV: 7.8 fL (ref 0–99.8)
POC Granulocyte: 4.7 (ref 2–6.9)
POC LYMPH PERCENT: 32.5 %L (ref 10–50)
POC MID %: 3.5 %M (ref 0–12)
Platelet Count, POC: 415 10*3/uL (ref 142–424)
RBC: 4.39 M/uL (ref 4.04–5.48)
RDW, POC: 14.8 %
WBC: 7.3 10*3/uL (ref 4.6–10.2)

## 2018-05-27 NOTE — Progress Notes (Addendum)
Patient ID: Christina [name], female    DOB: May 15, 1946  Age: 72 y.o. MRN: [id number]  Chief Complaint  Patient presents with  . Nasal Congestion    X f/u  seen by Dr. Nolon Rod 2 days ago - pt states not better  . Cough    X f/u- head congestion    Subjective:   Patient was here to go and seen for nasal congestion and cough.  The cough is continuing on so she came back in May.  She is not febrile.  Minimal head congestion.  Mostly the cough.  The cough bothers her more in the daytime than at night, though sometimes has been up for an hour or so at night with a cough.  She does not smoke, having quit 3 years ago.  No weight loss or change in appetite or generalized malaise particularly.  Current allergies, medications, problem list, past/family and social histories reviewed.  Objective:  BP 133/82   Pulse 81   Temp 97.8 F (36.6 C) (Oral)   Resp 20   Ht 5' 7.17" (1.706 m)   Wt 174 lb 3.2 oz (79 kg)   SpO2 96%   BMI 27.15 kg/m   Chest is clear to auscultation.  Heart regular without murmur.  Without nodes.  Throat clear.  TMs normal.  Assessment & Plan:   Assessment: 1. Acute upper respiratory infection   2. Solitary pulmonary nodule   3. Cough       Plan: Pulmonary mass lesion is probably the cause of her cough and it needs to be evaluated.  I talked to Dr. Nolon Rod who will follow up on the CT scan.  It is ordered for tomorrow believe.  Orders Placed This Encounter  Procedures  . DG Chest 2 View    Standing Status:   Future    Number of Occurrences:   1    Standing Expiration Date:   05/27/2019    Order Specific Question:   Reason for Exam (SYMPTOM  OR DIAGNOSIS REQUIRED)    Answer:   cough, not improving    Order Specific Question:   Preferred imaging location?    Answer:   External  . CT Chest W Contrast    Standing Status:   Future    Standing Expiration Date:   07/29/2019    Order Specific Question:   If indicated for the ordered procedure, I authorize the  administration of contrast media per Radiology protocol    Answer:   Yes    Order Specific Question:   Preferred imaging location?    Answer:   External    Order Specific Question:   Call Results- Best Contact Number?    Answer:   Pleas call to Dr. Nolon Rod [phone]    Order Specific Question:   Radiology Contrast Protocol - do NOT remove file path    Answer:   \\[email or web address]\Radiant\CTProtocols.pdf  . POCT CBC    No orders of the defined types were placed in this encounter.   06/05/18  I have tried several times to reach patient since receiving the CT report.  I spoke to nurse at [facility] and they will keep trying to reach her to schedule the PET/CT and oncology referral     Patient Instructions   Your chest x-ray shows a mass in the right lung.  This is suspicious for a tumor of some sort.  That is probably what is causing your cough.  Cough syrups will help some, but a definite diagnosis  is necessary.  I doubt that infection is the issue.  You are being scheduled for a CT scan of your chest, and will need to communicate with Dr. Nolon Rod after the results are available to decide on what referrals are necessary next.  Please go to [facility] Radiology at 9:00am they will do labs .  CT  scheduled for 10;00am  Liquids only 4 hours prior to exam. [address] [place], [place] [address]   If you have lab work done today you will be contacted with your lab results within the next 2 weeks.  If you have not heard from [place] then please contact us. The fastest way to get your results is to register for My Chart.   IF you received an x-ray today, you will receive an invoice from [facility] Rad[facility]. Please contact [facility] Radiology at [phone] with questions or concerns regarding your invoice.   IF you received labwork today, you will receive an invoice from [facility]. Please contact [facility] at [phone] with questions or concerns regarding your invoice.   Our billing  staff will not be able to assist you with questions regarding bills from these companies.  You will be contacted with the lab results as soon as they are available. The fastest way to get your results is to activate your My Chart account. Instructions are located on the last page of this paperwork. If you have not heard from [place] regarding the results in 2 weeks, please contact this office.        No follow-ups on file.   Ruben Reason, MD 05/27/2018

## 2018-05-27 NOTE — Telephone Encounter (Signed)
TC to PCP. Hassan Rowan made aware of stat results for [facility].

## 2018-05-27 NOTE — Telephone Encounter (Signed)
Right upper lobe mass lesion with evidence of right hilar adenopathy. CT of chest with contrast is recommended.

## 2018-05-27 NOTE — Patient Instructions (Addendum)
Your chest x-ray shows a mass in the right lung.  This is suspicious for a tumor of some sort.  That is probably what is causing your cough.  Cough syrups will help some, but a definite diagnosis is necessary.  I doubt that infection is the issue.  You are being scheduled for a CT scan of your chest, and will need to communicate with Dr. Nolon Rod after the results are available to decide on what referrals are necessary next.  Please go to [facility] Radiology at 9:00am they will do labs .  CT  scheduled for 10;00am  Liquids only 4 hours prior to exam. [address] [place], [place] [address]   If you have lab work done today you will be contacted with your lab results within the next 2 weeks.  If you have not heard from [place] then please contact us. The fastest way to get your results is to register for My Chart.   IF you received an x-ray today, you will receive an invoice from [facility] Radiology. Please contact [facility] Radiology at [phone] with questions or concerns regarding your invoice.   IF you received labwork today, you will receive an invoice from [facility]. Please contact [facility] at [phone] with questions or concerns regarding your invoice.   Our billing staff will not be able to assist you with questions regarding bills from these companies.  You will be contacted with the lab results as soon as they are available. The fastest way to get your results is to activate your My Chart account. Instructions are located on the last page of this paperwork. If you have not heard from [place] regarding the results in 2 weeks, please contact this office.

## 2018-05-28 ENCOUNTER — Ambulatory Visit (HOSPITAL_COMMUNITY): Payer: BLUE CROSS/BLUE SHIELD

## 2018-05-28 LAB — SPECIMEN STATUS

## 2018-05-29 NOTE — Telephone Encounter (Signed)
FYI

## 2018-05-30 LAB — SPECIMEN STATUS REPORT

## 2018-06-01 ENCOUNTER — Telehealth: Payer: Self-pay | Admitting: Family Medicine

## 2018-06-01 NOTE — Telephone Encounter (Signed)
Called ameribend pt insurance and left a message to call back to see what is the update status on pt ct approval. We sent over pt notes,labs etc over twice.

## 2018-06-02 ENCOUNTER — Ambulatory Visit (HOSPITAL_COMMUNITY)
Admission: RE | Admit: 2018-06-02 | Discharge: 2018-06-02 | Disposition: A | Discharge status: Home / Self Care | Payer: BLUE CROSS/BLUE SHIELD | Source: Ambulatory Visit | Attending: Family Medicine | Admitting: Family Medicine

## 2018-06-02 ENCOUNTER — Telehealth: Payer: Self-pay | Admitting: *Deleted

## 2018-06-02 DIAGNOSIS — R911 Solitary pulmonary nodule: Secondary | ICD-10-CM | POA: Diagnosis not present

## 2018-06-02 DIAGNOSIS — J209 Acute bronchitis, unspecified: Secondary | ICD-10-CM | POA: Diagnosis not present

## 2018-06-02 LAB — POCT I-STAT CREATININE: Creatinine, Ser: 0.9 mg/dL (ref 0.44–1.00)

## 2018-06-02 MED ORDER — IOHEXOL 300 MG/ML  SOLN
75.0000 mL | Freq: Once | INTRAMUSCULAR | Status: AC | PRN
  Administered 2018-06-02: 75 mL via INTRAVENOUS

## 2018-06-02 NOTE — Telephone Encounter (Signed)
CT chest call report- making sure that report is showing in chart and that the provider is aware that it is resulted.

## 2018-06-04 ENCOUNTER — Telehealth: Payer: Self-pay | Admitting: Family Medicine

## 2018-06-04 DIAGNOSIS — C799 Secondary malignant neoplasm of unspecified site: Secondary | ICD-10-CM

## 2018-06-04 NOTE — Telephone Encounter (Signed)
Dr. Linna Darner will be discussing the CT results with the patient

## 2018-06-05 ENCOUNTER — Telehealth: Payer: Self-pay

## 2018-06-05 ENCOUNTER — Telehealth: Payer: Self-pay | Admitting: *Deleted

## 2018-06-05 NOTE — Telephone Encounter (Signed)
Call from Dr. Linna Darner.  Has been unable to reach pt.  Pt needs to be advised of results and referral.  Attempted to contact patient.  L/m on home and mobile numbers to c/b.

## 2018-06-05 NOTE — Telephone Encounter (Signed)
Oncology Nurse Navigator Documentation  Oncology Nurse Navigator Flowsheets 06/05/2018  Navigator Location [facility]-  Navigator Encounter Type Telephone/I called Ms. Longley but was unable to reach her.  I left my name and phone number to call.   Telephone Outgoing Call  Treatment Phase Abnormal Scans  Barriers/Navigation Needs Education  Education Other  Interventions Education  Acuity Level 1  Time Spent with Patient 15

## 2018-06-05 NOTE — Telephone Encounter (Signed)
Oncology Nurse Navigator Documentation  Oncology Nurse Navigator Flowsheets 06/05/2018  Navigator Location [facility]  Referral date to RadOnc/MedOnc 06/04/2018  Navigator Encounter Type Telephone/I received referral on Christina [name] and I called her to schedule. I was unable to reach her but did leave a vm message for her to call me with my name and phone number.   Telephone Outgoing Call  Abnormal Finding Date 06/02/2018  Treatment Phase Abnormal Scans  Barriers/Navigation Needs Education;Coordination of Care  Education Other  Interventions Coordination of Care;Education  Coordination of Care Other  Education Method Verbal  Acuity Level 1  Time Spent with Patient 15

## 2018-06-06 ENCOUNTER — Encounter: Payer: Self-pay | Admitting: Family Medicine

## 2018-06-06 NOTE — Telephone Encounter (Signed)
Left message for patient to call back for more details. Oncology referral was placed because the CT shows cancer mets.

## 2018-06-06 NOTE — Telephone Encounter (Signed)
Left message to check the mychart and to call or come in for some information about the scan. Her daughter is listed on her chart.

## 2018-06-06 NOTE — Telephone Encounter (Signed)
Reviewed the CT chest with the patient as well as the referral to Oncology. Patient listened and after the explanation of the findings and that this is cancer but not sure if it is a primary lung cancer or a metastatic disease and a PET scan would be necessary. At the end the patient asked "should I be worried". Discussed that the oncologist will help with staging but that this is likely cancer. Discussed that she will hear from Oncology in 1 week since the referral was placed on 06/04/18 but that the holidays might slow down the process.

## 2018-06-08 ENCOUNTER — Encounter: Payer: Self-pay | Admitting: *Deleted

## 2018-06-08 ENCOUNTER — Telehealth: Payer: Self-pay | Admitting: *Deleted

## 2018-06-08 DIAGNOSIS — R918 Other nonspecific abnormal finding of lung field: Secondary | ICD-10-CM

## 2018-06-08 NOTE — Telephone Encounter (Signed)
Spoke with pt and she stated she spoke with someone at [facility] and that she is already sch with them for 06/18/18

## 2018-06-08 NOTE — Telephone Encounter (Signed)
Oncology Nurse Navigator Documentation  Oncology Nurse Navigator Flowsheets 06/08/2018  Navigator Location [facility]  Navigator Encounter Type Telephone/I received a message from patient and I called her back. I updated her on appt for [facility] next week. She verbalized understanding of appt time and place.   Telephone Outgoing Call  Treatment Phase Abnormal Scans  Barriers/Navigation Needs Education  Education Other  Interventions Education  Acuity Level 1  Time Spent with Patient 15

## 2018-06-18 ENCOUNTER — Other Ambulatory Visit: Payer: Self-pay | Admitting: *Deleted

## 2018-06-18 ENCOUNTER — Encounter: Payer: Self-pay | Admitting: Thoracic Surgery (Cardiothoracic Vascular Surgery)

## 2018-06-18 ENCOUNTER — Telehealth: Payer: Self-pay

## 2018-06-18 ENCOUNTER — Ambulatory Visit
Admission: RE | Admit: 2018-06-18 | Discharge: 2018-06-18 | Disposition: A | Discharge status: Home / Self Care | Payer: BLUE CROSS/BLUE SHIELD | Source: Ambulatory Visit | Attending: Radiation Oncology | Admitting: Radiation Oncology

## 2018-06-18 ENCOUNTER — Encounter: Payer: Self-pay | Admitting: *Deleted

## 2018-06-18 ENCOUNTER — Inpatient Hospital Stay: Payer: BLUE CROSS/BLUE SHIELD | Attending: Internal Medicine

## 2018-06-18 ENCOUNTER — Ambulatory Visit (INDEPENDENT_AMBULATORY_CARE_PROVIDER_SITE_OTHER): Payer: BLUE CROSS/BLUE SHIELD | Admitting: Thoracic Surgery (Cardiothoracic Vascular Surgery)

## 2018-06-18 ENCOUNTER — Telehealth: Payer: Self-pay | Admitting: Internal Medicine

## 2018-06-18 ENCOUNTER — Inpatient Hospital Stay (HOSPITAL_BASED_OUTPATIENT_CLINIC_OR_DEPARTMENT_OTHER): Payer: BLUE CROSS/BLUE SHIELD | Admitting: Internal Medicine

## 2018-06-18 VITALS — BP 156/90 | HR 87 | Temp 98.3°F | Resp 18 | Wt 178.2 lb

## 2018-06-18 DIAGNOSIS — C3490 Malignant neoplasm of unspecified part of unspecified bronchus or lung: Secondary | ICD-10-CM

## 2018-06-18 DIAGNOSIS — D491 Neoplasm of unspecified behavior of respiratory system: Secondary | ICD-10-CM | POA: Diagnosis not present

## 2018-06-18 DIAGNOSIS — R918 Other nonspecific abnormal finding of lung field: Secondary | ICD-10-CM

## 2018-06-18 DIAGNOSIS — R590 Localized enlarged lymph nodes: Secondary | ICD-10-CM

## 2018-06-18 DIAGNOSIS — Z87891 Personal history of nicotine dependence: Secondary | ICD-10-CM | POA: Diagnosis not present

## 2018-06-18 DIAGNOSIS — C3411 Malignant neoplasm of upper lobe, right bronchus or lung: Secondary | ICD-10-CM | POA: Insufficient documentation

## 2018-06-18 DIAGNOSIS — R599 Enlarged lymph nodes, unspecified: Secondary | ICD-10-CM

## 2018-06-18 DIAGNOSIS — R591 Generalized enlarged lymph nodes: Secondary | ICD-10-CM

## 2018-06-18 DIAGNOSIS — R911 Solitary pulmonary nodule: Secondary | ICD-10-CM | POA: Diagnosis not present

## 2018-06-18 LAB — CMP (CANCER CENTER ONLY)
ALT: 13 U/L (ref 0–44)
AST: 16 U/L (ref 15–41)
Albumin: 3.3 g/dL — ABNORMAL LOW (ref 3.5–5.0)
Alkaline Phosphatase: 98 U/L (ref 38–126)
Anion gap: 8 (ref 5–15)
BUN: 12 mg/dL (ref 8–23)
CO2: 26 mmol/L (ref 22–32)
Calcium: 9.3 mg/dL (ref 8.9–10.3)
Chloride: 106 mmol/L (ref 98–111)
Creatinine: 0.84 mg/dL (ref 0.44–1.00)
GFR, Estimated: >60 mL/min (ref >60)
Glucose, Bld: 103 mg/dL — ABNORMAL HIGH (ref 70–99)
Potassium: 3.6 mmol/L (ref 3.5–5.1)
Sodium: 140 mmol/L (ref 135–145)
Total Bilirubin: 0.4 mg/dL (ref 0.3–1.2)
Total Protein: 7.3 g/dL (ref 6.5–8.1)

## 2018-06-18 LAB — CBC WITH DIFFERENTIAL (CANCER CENTER ONLY)
Abs Immature Granulocytes: 0.02 10*3/uL (ref 0.00–0.07)
Basophils Absolute: 0.1 10*3/uL (ref 0.0–0.1)
Basophils Relative: 1 %
Eosinophils Absolute: 0.1 10*3/uL (ref 0.0–0.5)
Eosinophils Relative: 2 %
HCT: 40 % (ref 36.0–46.0)
Hemoglobin: 12.5 g/dL (ref 12.0–15.0)
Immature Granulocytes: 0 %
Lymphocytes Relative: 46 %
Lymphs Abs: 3 10*3/uL (ref 0.7–4.0)
MCH: 27.4 pg (ref 26.0–34.0)
MCHC: 31.3 g/dL (ref 30.0–36.0)
MCV: 87.7 fL (ref 80.0–100.0)
MONO ABS: 0.6 10*3/uL (ref 0.1–1.0)
Monocytes Relative: 8 %
Neutro Abs: 2.8 10*3/uL (ref 1.7–7.7)
Neutrophils Relative %: 43 %
PLATELETS: 373 10*3/uL (ref 150–400)
RBC: 4.56 MIL/uL (ref 3.87–5.11)
RDW: 15.5 % (ref 11.5–15.5)
WBC Count: 6.6 10*3/uL (ref 4.0–10.5)
nRBC: 0 % (ref 0.0–0.2)

## 2018-06-18 NOTE — H&P (View-Only) (Signed)
PCP is [name], MD Referring Provider is Hendricks Limes, MD   HPI: Christina [name] is sent to our multidisciplinary thoracic oncology clinic for evaluation of a right upper lobe lung mass with hilar and mediastinal adenopathy.  Christina [name] is a 73 year old woman with a history of tobacco abuse dating back to age 6.  She quit smoking about 2 years ago.  Her past medical history is also significant for hypertension, hyperlipidemia, and a tubulovillous adenoma of the colon.  She was in her usual state of health until a few weeks ago when she developed cough and congestion.  She thought she had a cold and went to urgent care.  She was treated with antibiotic but her symptoms did not improve.  She went back and the chest x-ray showed a right upper lobe lung mass.  A CT of the chest was done which confirmed a 7 cm right upper lobe lung mass.  There was bulky hilar adenopathy and borderline mediastinal adenopathy.  Her cough is resolved.  She denies any hemoptysis.  She denies any chest pain, pressure, or tightness.  She denies shortness of breath with exertion.  She has not had any change in appetite or weight loss.  She denies headaches or visual changes.  Zubrod Score: At the time of surgery this patient's most appropriate activity status/level should be described as: [x]     0    Normal activity, no symptoms []     1    Restricted in physical strenuous activity but ambulatory, able to do out light work []     2    Ambulatory and capable of self care, unable to do work activities, up and about >50 % of waking hours                              []     3    Only limited self care, in bed greater than 50% of waking hours []     4    Completely disabled, no self care, confined to bed or chair []     5    Moribund   Past Medical History:  Diagnosis Date  . Allergy   . GERD (gastroesophageal reflux disease)   . Hyperlipidemia   . Hypertension   . Reflux   . Tubulovillous adenoma of colon 2003     Past Surgical History:  Procedure Laterality Date  . CESAREAN SECTION     1 time  . COLONOSCOPY      Family History  Problem Relation Age of Onset  . Hypertension Mother   . Heart disease Mother   . Leukemia Father   . Cancer Father   . Hypertension Sister   . Hypertension Sister     Social History Social History   Tobacco Use  . Smoking status: Former Smoker    Packs/day: 0.50    Years: 50.00    Pack years: 25.00    Types: Cigarettes  . Smokeless tobacco: Never Used  . Tobacco comment: Previously quit in 2015 but has started smoking again   Substance Use Topics  . Alcohol use: No    Alcohol/week: 0.0 standard drinks  . Drug use: No    Current Outpatient Medications  Medication Sig Dispense Refill  . albuterol (PROAIR HFA) 108 (90 Base) MCG/ACT inhaler INHALE 2 PUFFS INTO THE LUNGS EVERY 6 HOURS AS NEEDED FOR WHEEZING OR SHORTNESS OF BREATH (Patient not taking: Reported on 06/18/2018)  8.5 g 0  . amLODipine (NORVASC) 10 MG tablet TAKE 1 TABLET(10 MG) BY MOUTH DAILY 90 tablet 1  . aspirin 81 MG tablet Take 81 mg by mouth daily.    Marland Kitchen atorvastatin (LIPITOR) 40 MG tablet Take 1 tablet 40 mg by mouth daily 90 tablet 0  . fluticasone (FLONASE) 50 MCG/ACT nasal spray Place 2 sprays into both nostrils daily. (Patient not taking: Reported on 06/18/2018) 16 g 6  . guaiFENesin (MUCINEX) 600 MG 12 hr tablet Take 2 tablets (1,200 mg total) by mouth 2 (two) times daily. (Patient not taking: Reported on 06/18/2018) 30 tablet 3  . omeprazole (PRILOSEC) 40 MG capsule Take 1 capsule (40 mg total) by mouth daily. 90 capsule 3  . sucralfate (CARAFATE) 1 g tablet TAKE 1 TABLET BY MOUTH 1 HOUR BEFORE MEALS AND AT BEDTIME AS NEEDED 120 tablet prn   No current facility-administered medications for this visit.     Allergies  Allergen Reactions  . Coconut Oil     HIVES  . Codeine Other (See Comments)    Upsets stomach really bad  . Shrimp [Shellfish Allergy] Hives  . Trandolapril Swelling     Swelling of the face/lips 07/2014    Review of Systems  Constitutional: Negative for activity change, appetite change and unexpected weight change.  HENT: Negative for trouble swallowing and voice change.   Respiratory: Positive for cough. Negative for chest tightness and shortness of breath.   Cardiovascular: Negative for chest pain.  Gastrointestinal: Negative for abdominal distention and abdominal pain.  Genitourinary: Negative for dysuria.  Neurological: Negative for tremors, seizures and headaches.    BP (!) 156/90   Pulse 87   Temp 98.3 F (36.8 C)   Resp 18   Wt 178 lb 3.2 oz (80.8 kg)   SpO2 100%   BMI 27.77 kg/m  Physical Exam Vitals signs reviewed.  Constitutional:      General: She is not in acute distress.    Appearance: Normal appearance.  HENT:     Head: Normocephalic and atraumatic.  Eyes:     General: No scleral icterus.    Conjunctiva/sclera: Conjunctivae normal.  Neck:     Musculoskeletal: Neck supple.  Cardiovascular:     Rate and Rhythm: Normal rate and regular rhythm.     Heart sounds: No murmur. No friction rub. No gallop.   Pulmonary:     Effort: Pulmonary effort is normal. No respiratory distress.     Breath sounds: Normal breath sounds. No wheezing or rales.  Abdominal:     General: There is no distension.     Palpations: Abdomen is soft.     Tenderness: There is no abdominal tenderness.  Musculoskeletal:     Right lower leg: No edema.     Left lower leg: No edema.  Lymphadenopathy:     Cervical: No cervical adenopathy.  Skin:    General: Skin is warm and dry.  Neurological:     General: No focal deficit present.     Mental Status: She is alert and oriented to person, place, and time.     Cranial Nerves: No cranial nerve deficit.     Motor: No weakness.     Coordination: Coordination normal.    Diagnostic Tests: CT CHEST WITH CONTRAST  TECHNIQUE: Multidetector CT imaging of the chest was performed during intravenous contrast  administration.  CONTRAST:  62mL OMNIPAQUE IOHEXOL 300 MG/ML IV.  COMPARISON:  No prior CT.  Chest x-rays 05/27/2018 and earlier.  FINDINGS:  Cardiovascular: Normal heart size. Severe LAD and RIGHT coronary atherosclerosis. Very small pericardial effusion in the SUPERIOR recess.  Moderate atherosclerosis involving the thoracic and proximal abdominal aorta and their visual below branches without evidence of aneurysm. Maximum diameter ascending thoracic aorta 3.6 cm.  Mediastinum/Nodes: Markedly enlarged RIGHT hilar lymph nodes, the conglomerate nodal mass measuring approximately 4.0 x 2.6 cm (series 3, image 78). This nodal mass extends into the mediastinum adjacent to an enlarged precarinal (station 4R) node which measures approximately 1.5 x 2.1 cm (image 68). An adjacent enlarged station 4R node (image 61) and an enhancing upper normal sized AP window (station 5) node are present (image 62). Small, likely pathologic nodes are present in station 2R (image 40).  Normal appearing esophagus. Heterogeneous enhancing nodule involving the LEFT lobe of the thyroid gland measuring approximately 3.3 cm maximally (coronal series 6, image 82). No other visible thyroid nodules.  Lungs/Pleura: Emphysematous changes throughout both lungs. Enhancing mass which may be demonstrating early central necrosis involving the ANTERIOR RIGHT UPPER LOBE with postobstructive atelectasis involving the RIGHT UPPER LOBE parenchyma distal to the mass. Best estimated measurements of the size of the mass is about 5 x 7 x 6 cm. The mass completely obstructs the ANTERIOR segment RIGHT UPPER LOBE bronchus and extensive the pleural surface, though there is no convincing chest wall invasion. No pulmonary parenchymal nodules or masses elsewhere in either lung. Central airways otherwise patent. No pleural masses or pleural effusions.  Upper Abdomen: Unremarkable for the early portal venous phase  of enhancement.  Musculoskeletal: Osseous demineralization. No acute findings. No evidence of osseous metastatic disease.  IMPRESSION: 1. Large mass involving the ANTERIOR segment RIGHT UPPER LOBE with approximate measurements of 5 x 7 x 6 cm, obstructing the ANTERIOR segment RIGHT UPPER LOBE bronchus with post-obstructive atelectasis distal to the mass. This is highly suspicious for a primary pulmonary malignancy. PET-CT is recommended in further evaluation for appropriate staging. 2. Nodal metastatic disease to the RIGHT hilum and likely mediastinal nodal metastatic disease involving stations 2R, 4R and 5. 3. No evidence of metastatic disease involving the visualized upper abdomen. 4. Approximate 3.3 cm solitary nodule involving the LEFT lobe of the thyroid gland. Non-emergent thyroid ultrasound is recommended in further evaluation. This follows ACR consensus guidelines: Managing Incidental Thyroid Nodules Detected on Imaging: White Paper of the ACR Incidental Thyroid Findings Committee. J Am Coll Radiol 2015; 12:143-150. 5. Normal heart size with LAD and RIGHT coronary artery atherosclerosis.  Aortic Atherosclerosis (ICD10-I70.0) and Emphysema (ICD10-J43.9).  These results will be called to the ordering clinician or representative by the Radiologist Assistant, and communication documented in the PACS or zVision Dashboard.   Electronically Signed   By: Evangeline Dakin M.D.   On: 06/02/2018 14:02 I personally reviewed the CT images and concur with the findings noted above  Impression: Christina [name] is a 73 year old woman with a history of tobacco abuse who presented with cough and congestion.  She was found to have a 7 cm right upper lobe lung mass with bulky hilar adenopathy and mediastinal adenopathy as well.  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  She needs a tissue diagnosis to guide therapy.  I  recommended to her that we proceed with navigational bronchoscopy and endobronchial ultrasound for diagnostic and staging purposes.  She understands this is not therapeutic in any way.  I informed her of the general nature of the procedure.  We would plan  to do it in the operating room under general anesthesia.  We will plan to do it on an outpatient basis.  I informed her of the indications, risk, benefits, and alternatives.  She understands the risk include, but not limited to MI, DVT, PE, bleeding, possible need for transfusion, infection, pneumothorax, as well as the possibility of other unforeseeable complications and in rare instances death.  She understands and accepts the risks and agrees to proceed  Plan: Electromagnetic navigational bronchoscopy and endobronchial ultrasound on Wednesday, 06/24/2018  Melrose Nakayama, MD [facility] [phone]

## 2018-06-18 NOTE — Progress Notes (Signed)
Christina [name]  Clinical Social [name] met with patient/family at [employer] appointment to offer support and assess for psychosocial needs.  Patient was accompanied by her sister, Christina [name].  Christina [name] reported no concerns at this time- shared her faith in God takes away all worries.  Patient needs additional testing before treatment plan is established. Clinical Social [name] briefly discussed Clinical Social [name] role and [employer] support programs/services.  Clinical Social [name] encouraged patient to call with any additional questions or concerns.   Maryjean Morn, MSW, LCSW, OSW-C Clinical Social Worker [facility] [phone]

## 2018-06-18 NOTE — Progress Notes (Signed)
[place] Telephone:([phone]) [phone]   Fax:([phone]) [phone] Multidisciplinary thoracic oncology clinic  CONSULT NOTE  REFERRING PHYSICIAN: Dr. Delia Chimes  REASON FOR CONSULTATION:  73 years old African-American female with suspicious lung cancer.  HPI Christina [name] is a 73 y.o. female with past medical history significant for hypertension, dyslipidemia, GERD as well as long history for smoking.  The patient was seen by her primary care physician complaining of cough and chest congestion.  She was treated with a course of antibiotics with no improvement of her symptoms.  Chest x-ray was performed on May 27, 2018 and that showed a mass lesion emanating from the right hilum which measured at least 7.3 x 6.5 x 5.5 cm.  This was followed by CT scan of the chest on June 02, 2018 and that showed enhancing mass with early central necrosing involving the anterior right upper lobe with postobstructive atelectasis involving the right upper lobe parenchyma distal to the mass.  The mass measured 5.0 x 7.0 x 6.0 cm.  The mass completely obstructs the anterior segment of the right upper lobe bronchus and exten extends into the pleural service but there was no convincing chest wall invasion.  There was also markedly enlarged right hilar lymph nodes, the conglomerate nodal mass measured 4.0 x 2.6 cm.  This nodal mass extends into the mediastinum adjacent to an enlarging precarinal 4R node which measures approximately 1.5 x 2.1 cm.  There was an adjacent enlarged station 4R node and an enhancing upper normal size AP window node as well as a small likely pathologic nodes and station 2R. The patient was referred to the multidisciplinary thoracic oncology clinic today for evaluation and recommendation regarding treatment of her condition. When seen today the patient is feeling fine except for the head congestion.  She denied having any chest pain, shortness breath, cough or hemoptysis.  She  denied having any recent fever or chills.  She has no nausea, vomiting, diarrhea or constipation.  She has no significant weight loss or night sweats.  She has no headache or visual changes. Family history significant for father with leukemia and mother had hypertension and coronary artery disease. The patient is married and has 1 daughter.  She was accompanied today by her Sister Cecelia.  She is currently retired and used to work in a [employer], [employer].  She has a history of smoking 1 pack/day for around 55 years and quit 1 year ago.  She has no history of alcohol or drug abuse.  HPI  Past Medical History:  Diagnosis Date  . Allergy   . GERD (gastroesophageal reflux disease)   . Hyperlipidemia   . Hypertension   . Reflux   . Tubulovillous adenoma of colon 2003    Past Surgical History:  Procedure Laterality Date  . CESAREAN SECTION     1 time  . COLONOSCOPY      Family History  Problem Relation Age of Onset  . Hypertension Mother   . Heart disease Mother   . Leukemia Father   . Cancer Father   . Hypertension Sister   . Hypertension Sister     Social History Social History   Tobacco Use  . Smoking status: Former Smoker    Packs/day: 0.50    Years: 4.00    Pack years: 2.00    Types: Cigarettes  . Smokeless tobacco: Never Used  . Tobacco comment: Previously quit in 2015 but has started smoking again   Substance Use Topics  .  Alcohol use: No    Alcohol/week: 0.0 standard drinks  . Drug use: No    Allergies  Allergen Reactions  . Coconut Oil     HIVES  . Codeine Other (See Comments)    Upsets stomach really bad  . Shrimp [Shellfish Allergy] Hives  . Trandolapril Swelling    Swelling of the face/lips 07/2014    Current Outpatient Medications  Medication Sig Dispense Refill  . albuterol (PROAIR HFA) 108 (90 Base) MCG/ACT inhaler INHALE 2 PUFFS INTO THE LUNGS EVERY 6 HOURS AS NEEDED FOR WHEEZING OR SHORTNESS OF BREATH 8.5 g 0  . amLODipine (NORVASC) 10 MG  tablet TAKE 1 TABLET(10 MG) BY MOUTH DAILY 90 tablet 1  . aspirin 81 MG tablet Take 81 mg by mouth daily.    Marland Kitchen atorvastatin (LIPITOR) 40 MG tablet Take 1 tablet 40 mg by mouth daily 90 tablet 0  . fluticasone (FLONASE) 50 MCG/ACT nasal spray Place 2 sprays into both nostrils daily. 16 g 6  . guaiFENesin (MUCINEX) 600 MG 12 hr tablet Take 2 tablets (1,200 mg total) by mouth 2 (two) times daily. 30 tablet 3  . omeprazole (PRILOSEC) 40 MG capsule Take 1 capsule (40 mg total) by mouth daily. 90 capsule 3  . sucralfate (CARAFATE) 1 g tablet TAKE 1 TABLET BY MOUTH 1 HOUR BEFORE MEALS AND AT BEDTIME AS NEEDED 120 tablet prn   No current facility-administered medications for this visit.     Review of Systems  Constitutional: negative Eyes: negative Ears, nose, mouth, throat, and face: positive for nasal congestion Respiratory: negative Cardiovascular: negative Gastrointestinal: negative Genitourinary:negative Integument/breast: negative Hematologic/lymphatic: negative Musculoskeletal:negative Neurological: negative Behavioral/Psych: negative Endocrine: negative Allergic/Immunologic: negative  Physical Exam  EUM:PNTIR, healthy, no distress, well nourished and well developed SKIN: skin color, texture, turgor are normal, no rashes or significant lesions HEAD: Normocephalic, No masses, lesions, tenderness or abnormalities EYES: normal, PERRLA, Conjunctiva are pink and non-injected EARS: External ears normal, Canals clear OROPHARYNX:no exudate, no erythema and lips, buccal mucosa, and tongue normal  NECK: supple, no adenopathy, no JVD LYMPH:  no palpable lymphadenopathy, no hepatosplenomegaly BREAST:not examined LUNGS: clear to auscultation , and palpation HEART: regular rate & rhythm, no murmurs and no gallops ABDOMEN:abdomen soft, non-tender, normal bowel sounds and no masses or organomegaly BACK: Back symmetric, no curvature., No CVA tenderness EXTREMITIES:no joint deformities,  effusion, or inflammation, no edema  NEURO: alert & oriented x 3 with fluent speech, no focal motor/sensory deficits  PERFORMANCE STATUS: ECOG 1  LABORATORY DATA: Lab Results  Component Value Date   WBC 7.3 05/27/2018   HGB 12.6 05/27/2018   HCT 36.8 05/27/2018   MCV 83.9 05/27/2018   PLT WILL FOLLOW 05/27/2018      Chemistry      Component Value Date/Time   NA 144 04/02/2017 1435   K 3.7 04/02/2017 1435   CL 103 04/02/2017 1435   CO2 25 04/02/2017 1435   BUN 11 04/02/2017 1435   CREATININE 0.90 06/02/2018 0953   CREATININE 0.87 03/22/2016 0908      Component Value Date/Time   CALCIUM 9.4 04/02/2017 1435   ALKPHOS 95 04/02/2017 1435   AST 25 04/02/2017 1435   ALT 21 04/02/2017 1435   BILITOT 0.4 04/02/2017 1435       RADIOGRAPHIC STUDIES: Dg Chest 2 View  Result Date: 05/27/2018 CLINICAL DATA:  Cough without improvement EXAM: CHEST - 2 VIEW COMPARISON:  08/20/2014 FINDINGS: Cardiac shadow is within normal limits. Aortic calcifications are again seen. There is  a mass lesion emanating from the right hilum which measures at least 7.3 x 6.5 cm in greatest transverse and craniocaudad projections. It measures approximately 5.5 cm in AP dimension on the lateral projection. Fullness in the right hilum is noted which contributes to the density in the frontal image. No other focal abnormality is seen. No bony abnormality is noted. IMPRESSION: Right upper lobe mass lesion with evidence of right hilar adenopathy. CT of the chest with contrast material is recommended for further evaluation. These results will be called to the ordering clinician or representative by the Radiologist Assistant, and communication documented in the PACS or zVision Dashboard. Electronically Signed   By: Inez Catalina M.D.   On: 05/27/2018 11:36   Ct Chest W Contrast  Result Date: 06/02/2018 CLINICAL DATA:  73 year old diagnosed with acute bronchitis approximately 1-1/2 months ago after a persistent cough for  several weeks. Patient states that the cough has now resolved. Recent chest x-ray demonstrating a RIGHT UPPER LOBE lung mass and RIGHT hilar lymphadenopathy. Former smoker. EXAM: CT CHEST WITH CONTRAST TECHNIQUE: Multidetector CT imaging of the chest was performed during intravenous contrast administration. CONTRAST:  51mL OMNIPAQUE IOHEXOL 300 MG/ML IV. COMPARISON:  No prior CT.  Chest x-rays 05/27/2018 and earlier. FINDINGS: Cardiovascular: Normal heart size. Severe LAD and RIGHT coronary atherosclerosis. Very small pericardial effusion in the SUPERIOR recess. Moderate atherosclerosis involving the thoracic and proximal abdominal aorta and their visual below branches without evidence of aneurysm. Maximum diameter ascending thoracic aorta 3.6 cm. Mediastinum/Nodes: Markedly enlarged RIGHT hilar lymph nodes, the conglomerate nodal mass measuring approximately 4.0 x 2.6 cm (series 3, image 78). This nodal mass extends into the mediastinum adjacent to an enlarged precarinal (station 4R) node which measures approximately 1.5 x 2.1 cm (image 68). An adjacent enlarged station 4R node (image 61) and an enhancing upper normal sized AP window (station 5) node are present (image 62). Small, likely pathologic nodes are present in station 2R (image 40). Normal appearing esophagus. Heterogeneous enhancing nodule involving the LEFT lobe of the thyroid gland measuring approximately 3.3 cm maximally (coronal series 6, image 82). No other visible thyroid nodules. Lungs/Pleura: Emphysematous changes throughout both lungs. Enhancing mass which may be demonstrating early central necrosis involving the ANTERIOR RIGHT UPPER LOBE with postobstructive atelectasis involving the RIGHT UPPER LOBE parenchyma distal to the mass. Best estimated measurements of the size of the mass is about 5 x 7 x 6 cm. The mass completely obstructs the ANTERIOR segment RIGHT UPPER LOBE bronchus and extensive the pleural surface, though there is no convincing  chest wall invasion. No pulmonary parenchymal nodules or masses elsewhere in either lung. Central airways otherwise patent. No pleural masses or pleural effusions. Upper Abdomen: Unremarkable for the early portal venous phase of enhancement. Musculoskeletal: Osseous demineralization. No acute findings. No evidence of osseous metastatic disease. IMPRESSION: 1. Large mass involving the ANTERIOR segment RIGHT UPPER LOBE with approximate measurements of 5 x 7 x 6 cm, obstructing the ANTERIOR segment RIGHT UPPER LOBE bronchus with post-obstructive atelectasis distal to the mass. This is highly suspicious for a primary pulmonary malignancy. PET-CT is recommended in further evaluation for appropriate staging. 2. Nodal metastatic disease to the RIGHT hilum and likely mediastinal nodal metastatic disease involving stations 2R, 4R and 5. 3. No evidence of metastatic disease involving the visualized upper abdomen. 4. Approximate 3.3 cm solitary nodule involving the LEFT lobe of the thyroid gland. Non-emergent thyroid ultrasound is recommended in further evaluation. This follows ACR consensus guidelines: Managing Incidental Thyroid  Nodules Detected on Imaging: White Paper of the ACR Incidental Thyroid Findings Committee. J Am Coll Radiol 2015; 12:143-150. 5. Normal heart size with LAD and RIGHT coronary artery atherosclerosis. Aortic Atherosclerosis (ICD10-I70.0) and Emphysema (ICD10-J43.9). These results will be called to the ordering clinician or representative by the Radiologist Assistant, and communication documented in the PACS or zVision Dashboard. Electronically Signed   By: Evangeline Dakin M.D.   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  She presented with large right upper lobe lung mass in addition to right hilar and mediastinal  lymphadenopathy.   PLAN: I had a lengthy discussion with the patient and her sister today about her current disease, stage and further investigation to confirm diagnosis and treatment options. I personally and independently reviewed the scan images and discussed the result and showed the images to the patient and her sister today. I recommended for the patient to complete the staging work-up by ordering a PET scan as well as MRI of the brain to rule out any metastatic disease. We will arrange for the patient to see Dr. Roxan Hockey for consideration of bronchoscopy with endobronchial ultrasound and biopsy of the right upper lobe lung mass for tissue diagnosis. If the patient has no concerning findings for disease metastasis, she may benefit from a course of concurrent chemoradiation with weekly carboplatin and paclitaxel.  She was seen today by radiation oncology, Dr. Tammi Klippel. I will arrange for the patient to come back for follow-up visit in 2 weeks or sooner for reevaluation and more detailed discussion of her treatment options based on the final pathology and staging work-up. She was advised to call immediately if she has any concerning symptoms in the interval. The patient voices understanding of current disease status and treatment options and is in agreement with the current care plan.  All questions were answered. The patient knows to call the clinic with any problems, questions or concerns. We can certainly see the patient much sooner if necessary.  Thank you so much for allowing me to participate in the care of Christina [name]. I will continue to follow up the patient with you and assist in her care.  I spent 40 minutes counseling the patient face to face. The total time spent in the appointment was 60 minutes.  Disclaimer: This note was dictated with voice recognition software. Similar sounding words can inadvertently be transcribed and may not be corrected upon review.   Eilleen Kempf June 18, 2018, 1:33 PM

## 2018-06-18 NOTE — Progress Notes (Signed)
The proposed treatment discussion purpose only and is not a binding recommendation.  The patient was not physically examined or present for their treatment options. Therefore, final treatment plans cannot be decided.

## 2018-06-18 NOTE — Telephone Encounter (Signed)
Spoke with patient to confirm appointment for today

## 2018-06-18 NOTE — Progress Notes (Signed)
PCP is [name], MD Referring Provider is Hendricks Limes, MD   HPI: Christina [name] is sent to our multidisciplinary thoracic oncology clinic for evaluation of a right upper lobe lung mass with hilar and mediastinal adenopathy.  Christina [name] is a 73 year old woman with a history of tobacco abuse dating back to age 89.  She quit smoking about 2 years ago.  Her past medical history is also significant for hypertension, hyperlipidemia, and a tubulovillous adenoma of the colon.  She was in her usual state of health until a few weeks ago when she developed cough and congestion.  She thought she had a cold and went to urgent care.  She was treated with antibiotic but her symptoms did not improve.  She went back and the chest x-ray showed a right upper lobe lung mass.  A CT of the chest was done which confirmed a 7 cm right upper lobe lung mass.  There was bulky hilar adenopathy and borderline mediastinal adenopathy.  Her cough is resolved.  She denies any hemoptysis.  She denies any chest pain, pressure, or tightness.  She denies shortness of breath with exertion.  She has not had any change in appetite or weight loss.  She denies headaches or visual changes.  Zubrod Score: At the time of surgery this patient's most appropriate activity status/level should be described as: [x]     0    Normal activity, no symptoms []     1    Restricted in physical strenuous activity but ambulatory, able to do out light work []     2    Ambulatory and capable of self care, unable to do work activities, up and about >50 % of waking hours                              []     3    Only limited self care, in bed greater than 50% of waking hours []     4    Completely disabled, no self care, confined to bed or chair []     5    Moribund   Past Medical History:  Diagnosis Date  . Allergy   . GERD (gastroesophageal reflux disease)   . Hyperlipidemia   . Hypertension   . Reflux   . Tubulovillous adenoma of colon 2003     Past Surgical History:  Procedure Laterality Date  . CESAREAN SECTION     1 time  . COLONOSCOPY      Family History  Problem Relation Age of Onset  . Hypertension Mother   . Heart disease Mother   . Leukemia Father   . Cancer Father   . Hypertension Sister   . Hypertension Sister     Social History Social History   Tobacco Use  . Smoking status: Former Smoker    Packs/day: 0.50    Years: 50.00    Pack years: 25.00    Types: Cigarettes  . Smokeless tobacco: Never Used  . Tobacco comment: Previously quit in 2015 but has started smoking again   Substance Use Topics  . Alcohol use: No    Alcohol/week: 0.0 standard drinks  . Drug use: No    Current Outpatient Medications  Medication Sig Dispense Refill  . albuterol (PROAIR HFA) 108 (90 Base) MCG/ACT inhaler INHALE 2 PUFFS INTO THE LUNGS EVERY 6 HOURS AS NEEDED FOR WHEEZING OR SHORTNESS OF BREATH (Patient not taking: Reported on 06/18/2018)  8.5 g 0  . amLODipine (NORVASC) 10 MG tablet TAKE 1 TABLET(10 MG) BY MOUTH DAILY 90 tablet 1  . aspirin 81 MG tablet Take 81 mg by mouth daily.    Marland Kitchen atorvastatin (LIPITOR) 40 MG tablet Take 1 tablet 40 mg by mouth daily 90 tablet 0  . fluticasone (FLONASE) 50 MCG/ACT nasal spray Place 2 sprays into both nostrils daily. (Patient not taking: Reported on 06/18/2018) 16 g 6  . guaiFENesin (MUCINEX) 600 MG 12 hr tablet Take 2 tablets (1,200 mg total) by mouth 2 (two) times daily. (Patient not taking: Reported on 06/18/2018) 30 tablet 3  . omeprazole (PRILOSEC) 40 MG capsule Take 1 capsule (40 mg total) by mouth daily. 90 capsule 3  . sucralfate (CARAFATE) 1 g tablet TAKE 1 TABLET BY MOUTH 1 HOUR BEFORE MEALS AND AT BEDTIME AS NEEDED 120 tablet prn   No current facility-administered medications for this visit.     Allergies  Allergen Reactions  . Coconut Oil     HIVES  . Codeine Other (See Comments)    Upsets stomach really bad  . Shrimp [Shellfish Allergy] Hives  . Trandolapril Swelling     Swelling of the face/lips 07/2014    Review of Systems  Constitutional: Negative for activity change, appetite change and unexpected weight change.  HENT: Negative for trouble swallowing and voice change.   Respiratory: Positive for cough. Negative for chest tightness and shortness of breath.   Cardiovascular: Negative for chest pain.  Gastrointestinal: Negative for abdominal distention and abdominal pain.  Genitourinary: Negative for dysuria.  Neurological: Negative for tremors, seizures and headaches.    BP (!) 156/90   Pulse 87   Temp 98.3 F (36.8 C)   Resp 18   Wt 178 lb 3.2 oz (80.8 kg)   SpO2 100%   BMI 27.77 kg/m  Physical Exam Vitals signs reviewed.  Constitutional:      General: She is not in acute distress.    Appearance: Normal appearance.  HENT:     Head: Normocephalic and atraumatic.  Eyes:     General: No scleral icterus.    Conjunctiva/sclera: Conjunctivae normal.  Neck:     Musculoskeletal: Neck supple.  Cardiovascular:     Rate and Rhythm: Normal rate and regular rhythm.     Heart sounds: No murmur. No friction rub. No gallop.   Pulmonary:     Effort: Pulmonary effort is normal. No respiratory distress.     Breath sounds: Normal breath sounds. No wheezing or rales.  Abdominal:     General: There is no distension.     Palpations: Abdomen is soft.     Tenderness: There is no abdominal tenderness.  Musculoskeletal:     Right lower leg: No edema.     Left lower leg: No edema.  Lymphadenopathy:     Cervical: No cervical adenopathy.  Skin:    General: Skin is warm and dry.  Neurological:     General: No focal deficit present.     Mental Status: She is alert and oriented to person, place, and time.     Cranial Nerves: No cranial nerve deficit.     Motor: No weakness.     Coordination: Coordination normal.    Diagnostic Tests: CT CHEST WITH CONTRAST  TECHNIQUE: Multidetector CT imaging of the chest was performed during intravenous contrast  administration.  CONTRAST:  69mL OMNIPAQUE IOHEXOL 300 MG/ML IV.  COMPARISON:  No prior CT.  Chest x-rays 05/27/2018 and earlier.  FINDINGS:  Cardiovascular: Normal heart size. Severe LAD and RIGHT coronary atherosclerosis. Very small pericardial effusion in the SUPERIOR recess.  Moderate atherosclerosis involving the thoracic and proximal abdominal aorta and their visual below branches without evidence of aneurysm. Maximum diameter ascending thoracic aorta 3.6 cm.  Mediastinum/Nodes: Markedly enlarged RIGHT hilar lymph nodes, the conglomerate nodal mass measuring approximately 4.0 x 2.6 cm (series 3, image 78). This nodal mass extends into the mediastinum adjacent to an enlarged precarinal (station 4R) node which measures approximately 1.5 x 2.1 cm (image 68). An adjacent enlarged station 4R node (image 61) and an enhancing upper normal sized AP window (station 5) node are present (image 62). Small, likely pathologic nodes are present in station 2R (image 40).  Normal appearing esophagus. Heterogeneous enhancing nodule involving the LEFT lobe of the thyroid gland measuring approximately 3.3 cm maximally (coronal series 6, image 82). No other visible thyroid nodules.  Lungs/Pleura: Emphysematous changes throughout both lungs. Enhancing mass which may be demonstrating early central necrosis involving the ANTERIOR RIGHT UPPER LOBE with postobstructive atelectasis involving the RIGHT UPPER LOBE parenchyma distal to the mass. Best estimated measurements of the size of the mass is about 5 x 7 x 6 cm. The mass completely obstructs the ANTERIOR segment RIGHT UPPER LOBE bronchus and extensive the pleural surface, though there is no convincing chest wall invasion. No pulmonary parenchymal nodules or masses elsewhere in either lung. Central airways otherwise patent. No pleural masses or pleural effusions.  Upper Abdomen: Unremarkable for the early portal venous phase  of enhancement.  Musculoskeletal: Osseous demineralization. No acute findings. No evidence of osseous metastatic disease.  IMPRESSION: 1. Large mass involving the ANTERIOR segment RIGHT UPPER LOBE with approximate measurements of 5 x 7 x 6 cm, obstructing the ANTERIOR segment RIGHT UPPER LOBE bronchus with post-obstructive atelectasis distal to the mass. This is highly suspicious for a primary pulmonary malignancy. PET-CT is recommended in further evaluation for appropriate staging. 2. Nodal metastatic disease to the RIGHT hilum and likely mediastinal nodal metastatic disease involving stations 2R, 4R and 5. 3. No evidence of metastatic disease involving the visualized upper abdomen. 4. Approximate 3.3 cm solitary nodule involving the LEFT lobe of the thyroid gland. Non-emergent thyroid ultrasound is recommended in further evaluation. This follows ACR consensus guidelines: Managing Incidental Thyroid Nodules Detected on Imaging: White Paper of the ACR Incidental Thyroid Findings Committee. J Am Coll Radiol 2015; 12:143-150. 5. Normal heart size with LAD and RIGHT coronary artery atherosclerosis.  Aortic Atherosclerosis (ICD10-I70.0) and Emphysema (ICD10-J43.9).  These results will be called to the ordering clinician or representative by the Radiologist Assistant, and communication documented in the PACS or zVision Dashboard.   Electronically Signed   By: Evangeline Dakin M.D.   On: 06/02/2018 14:02 I personally reviewed the CT images and concur with the findings noted above  Impression: Christina [name] is a 73 year old woman with a history of tobacco abuse who presented with cough and congestion.  She was found to have a 7 cm right upper lobe lung mass with bulky hilar adenopathy and mediastinal adenopathy as well.  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  She needs a tissue diagnosis to guide therapy.  I  recommended to her that we proceed with navigational bronchoscopy and endobronchial ultrasound for diagnostic and staging purposes.  She understands this is not therapeutic in any way.  I informed her of the general nature of the procedure.  We would plan  to do it in the operating room under general anesthesia.  We will plan to do it on an outpatient basis.  I informed her of the indications, risk, benefits, and alternatives.  She understands the risk include, but not limited to MI, DVT, PE, bleeding, possible need for transfusion, infection, pneumothorax, as well as the possibility of other unforeseeable complications and in rare instances death.  She understands and accepts the risks and agrees to proceed  Plan: Electromagnetic navigational bronchoscopy and endobronchial ultrasound on Wednesday, 06/24/2018  Melrose Nakayama, MD [facility] [phone]

## 2018-06-18 NOTE — Progress Notes (Signed)
Oncology Nurse Navigator Documentation  Oncology Nurse Navigator Flowsheets 06/18/2018  Navigator Location [facility]  Navigator Encounter Type Clinic/MDC/I spoke with patient and sister today at thoracic clinic.  Ms. Grandberry treatment plan is scans and tissue DX.  I gave and explained information on lung cancer and next steps.  Scan authorization coordinator was contacted to help get scans authorized quickly.  Patient is scheduled for bronchoscopy on 06/24/18 with Dr. Roxan Hockey.    Abnormal Finding Date 06/01/2018  Multidisiplinary Clinic Date 06/18/2018  Patient Visit Type MedOnc  Treatment Phase Abnormal Scans  Barriers/Navigation Needs Education;Coordination of Care  Education Newly Diagnosed Cancer Education;Other  Interventions Coordination of Care;Education  Coordination of Care Other  Education Method Verbal;Written  Acuity Level 2  Time Spent with Patient 25

## 2018-06-18 NOTE — Progress Notes (Signed)
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 Primary cancer of right upper lobe of lung ([place]) C34.11     HISTORY OF PRESENT ILLNESS::Christina [name] is a 73 y.o. female who presented with a cough for 6 weeks without improvement.  Chest x-ray was performed on 05/27/2018.  This study showed a new 7.5 cm mass lesion emanating from the right hilum.   As a result, the patient underwent chest CT on 06/02/2018.  This study confirmed the presence of a 7 cm large mass involving the anterior segment of the right upper lung obstructing the anterior segment with postobstructive atelectasis.  Lymphadenopathy was noted in the right hilum as well as likely mediastinal lymphadenopathy.   PET scan was recommended and the patient was referred to the multidisciplinary thoracic oncology clinic today.  The patient was referred today for presentation in the multidisciplinary conference.  Radiology studies were presented there for review and discussion of treatment options.  A consensus was discussed regarding potential next steps.  PREVIOUS RADIATION THERAPY: No  PAST MEDICAL HISTORY:  has a past medical history of Allergy, GERD (gastroesophageal reflux disease), Hyperlipidemia, Hypertension, Reflux, and Tubulovillous adenoma of colon (2003).    PAST SURGICAL HISTORY: Past Surgical History:  Procedure Laterality Date  . CESAREAN SECTION     1 time  . COLONOSCOPY      FAMILY HISTORY: family history includes Cancer in her father; Heart disease in her mother; Hypertension in her mother, sister,  and sister; Leukemia in her father.  SOCIAL HISTORY:  reports that she has quit smoking. Her smoking use included cigarettes. She has a 25.00 pack-year smoking history. She has never used smokeless tobacco. She reports that she does not drink alcohol or use drugs.  ALLERGIES: Coconut oil; Codeine; Shrimp [shellfish allergy]; and Trandolapril  MEDICATIONS:  Current Outpatient Medications  Medication Sig Dispense Refill  . albuterol (PROAIR HFA) 108 (90 Base) MCG/ACT inhaler INHALE 2 PUFFS INTO THE LUNGS EVERY 6 HOURS AS NEEDED FOR WHEEZING OR SHORTNESS OF BREATH (Patient not taking: Reported on 06/18/2018) 8.5 g 0  . amLODipine (NORVASC) 10 MG tablet TAKE 1 TABLET(10 MG) BY MOUTH DAILY 90 tablet 1  . aspirin 81 MG tablet Take 81 mg by mouth daily.    Marland Kitchen atorvastatin (LIPITOR) 40 MG tablet Take 1 tablet 40 mg by mouth daily 90 tablet 0  . fluticasone (FLONASE) 50 MCG/ACT nasal spray Place 2 sprays into both nostrils daily. (Patient not taking: Reported on 06/18/2018) 16 g 6  . guaiFENesin (MUCINEX) 600 MG 12 hr tablet Take 2 tablets (1,200 mg total) by mouth 2 (two) times daily. (Patient not taking: Reported on 06/18/2018) 30 tablet 3  . omeprazole (PRILOSEC) 40 MG capsule Take 1 capsule (40 mg total) by mouth daily. 90 capsule 3  . sucralfate (CARAFATE) 1 g tablet TAKE 1 TABLET BY MOUTH 1 HOUR BEFORE MEALS AND AT BEDTIME AS NEEDED 120 tablet prn   No current facility-administered medications for this encounter.     REVIEW OF SYSTEMS:  On review of systems, the patient reports that she is doing well overall.  She  denies any chest pain, shortness of breath, cough, hemoptysis, fevers, chills, night sweats, or unintended weight changes.  She denies any bowel or bladder disturbances, and denies abdominal pain, nausea or vomiting.  She denies any new musculoskeletal or joint aches or pains. A complete review of systems is obtained and is otherwise negative.   PHYSICAL EXAM:  weight is 178 lb 3.2 oz (80.8  kg). Her temperature is 98.3 F (36.8 C). Her blood pressure is 156/90 (abnormal) and her pulse is 87. Her respiration is 18 and oxygen saturation is 100%.   In general this is a well appearing African-American female in no acute distress.  She is alert and oriented x4 and appropriate throughout the examination. HEENT reveals that the patient is normocephalic, atraumatic. EOMs are intact. PERRLA. Skin is intact without any evidence of gross lesions. Cardiovascular exam reveals a regular rate and rhythm, no clicks rubs or murmurs are auscultated. Chest is clear to auscultation bilaterally. Lymphatic assessment is performed and does not reveal any adenopathy in the cervical, supraclavicular, axillary, or inguinal chains. Abdomen has active bowel sounds in all quadrants and is intact. The abdomen is soft, non tender, non distended. Lower extremities are negative for pretibial pitting edema, deep calf tenderness, cyanosis or clubbing.  KPS = 100  100 - Normal; no complaints; no evidence of disease. 90   - Able to carry on normal activity; minor signs or symptoms of disease. 80   - Normal activity with effort; some signs or symptoms of disease. 42   - Cares for self; unable to carry on normal activity or to do active work. 60   - Requires occasional assistance, but is able to care for most of his personal needs. 50   - Requires considerable assistance and frequent medical care. 81   - Disabled; requires special care and assistance. 54   - Severely disabled; hospital admission is indicated although death not imminent. 23   - Very sick; hospital admission necessary; active supportive treatment necessary. 10   - Moribund; fatal processes progressing rapidly. 0     - Dead  Karnofsky DA, Abelmann Kosse, Craver LS and Burchenal JH [phone]) The use of the nitrogen mustards in the palliative treatment of carcinoma: with particular reference to bronchogenic carcinoma Cancer 1 634-56  LABORATORY DATA:  Lab Results    Component Value Date   WBC 6.6 06/18/2018   HGB 12.5 06/18/2018   HCT 40.0 06/18/2018   MCV 87.7 06/18/2018   PLT 373 06/18/2018   Lab Results  Component Value Date   NA 140 06/18/2018   K 3.6 06/18/2018   CL 106 06/18/2018   CO2 26 06/18/2018   Lab Results  Component Value Date   ALT 13 06/18/2018   AST 16 06/18/2018   ALKPHOS 98 06/18/2018   BILITOT 0.4 06/18/2018    PULMONARY FUNCTION TEST:   Recent Review Flowsheet Data    There is no flowsheet data to display.      RADIOGRAPHY: Dg Chest 2 View  Result Date: 05/27/2018 CLINICAL DATA:  Cough without improvement EXAM: CHEST - 2 VIEW COMPARISON:  08/20/2014 FINDINGS: Cardiac shadow is within normal limits. Aortic calcifications are again seen. There is a mass lesion emanating from the right hilum which measures at least 7.3 x 6.5 cm in greatest transverse and craniocaudad projections. It measures approximately 5.5 cm in AP dimension on the lateral projection. Fullness in the right hilum is noted which contributes to the density in the frontal image. No other  focal abnormality is seen. No bony abnormality is noted. IMPRESSION: Right upper lobe mass lesion with evidence of right hilar adenopathy. CT of the chest with contrast material is recommended for further evaluation. These results will be called to the ordering clinician or representative by the Radiologist Assistant, and communication documented in the PACS or zVision Dashboard. Electronically Signed   By: Inez Catalina M.D.   On: 05/27/2018 11:36   Ct Chest W Contrast  Result Date: 06/02/2018 CLINICAL DATA:  73 year old diagnosed with acute bronchitis approximately 1-1/2 months ago after a persistent cough for several weeks. Patient states that the cough has now resolved. Recent chest x-ray demonstrating a RIGHT UPPER LOBE lung mass and RIGHT hilar lymphadenopathy. Former smoker. EXAM: CT CHEST WITH CONTRAST TECHNIQUE: Multidetector CT imaging of the chest was performed  during intravenous contrast administration. CONTRAST:  23mL OMNIPAQUE IOHEXOL 300 MG/ML IV. COMPARISON:  No prior CT.  Chest x-rays 05/27/2018 and earlier. FINDINGS: Cardiovascular: Normal heart size. Severe LAD and RIGHT coronary atherosclerosis. Very small pericardial effusion in the SUPERIOR recess. Moderate atherosclerosis involving the thoracic and proximal abdominal aorta and their visual below branches without evidence of aneurysm. Maximum diameter ascending thoracic aorta 3.6 cm. Mediastinum/Nodes: Markedly enlarged RIGHT hilar lymph nodes, the conglomerate nodal mass measuring approximately 4.0 x 2.6 cm (series 3, image 78). This nodal mass extends into the mediastinum adjacent to an enlarged precarinal (station 4R) node which measures approximately 1.5 x 2.1 cm (image 68). An adjacent enlarged station 4R node (image 61) and an enhancing upper normal sized AP window (station 5) node are present (image 62). Small, likely pathologic nodes are present in station 2R (image 40). Normal appearing esophagus. Heterogeneous enhancing nodule involving the LEFT lobe of the thyroid gland measuring approximately 3.3 cm maximally (coronal series 6, image 82). No other visible thyroid nodules. Lungs/Pleura: Emphysematous changes throughout both lungs. Enhancing mass which may be demonstrating early central necrosis involving the ANTERIOR RIGHT UPPER LOBE with postobstructive atelectasis involving the RIGHT UPPER LOBE parenchyma distal to the mass. Best estimated measurements of the size of the mass is about 5 x 7 x 6 cm. The mass completely obstructs the ANTERIOR segment RIGHT UPPER LOBE bronchus and extensive the pleural surface, though there is no convincing chest wall invasion. No pulmonary parenchymal nodules or masses elsewhere in either lung. Central airways otherwise patent. No pleural masses or pleural effusions. Upper Abdomen: Unremarkable for the early portal venous phase of enhancement. Musculoskeletal: Osseous  demineralization. No acute findings. No evidence of osseous metastatic disease. IMPRESSION: 1. Large mass involving the ANTERIOR segment RIGHT UPPER LOBE with approximate measurements of 5 x 7 x 6 cm, obstructing the ANTERIOR segment RIGHT UPPER LOBE bronchus with post-obstructive atelectasis distal to the mass. This is highly suspicious for a primary pulmonary malignancy. PET-CT is recommended in further evaluation for appropriate staging. 2. Nodal metastatic disease to the RIGHT hilum and likely mediastinal nodal metastatic disease involving stations 2R, 4R and 5. 3. No evidence of metastatic disease involving the visualized upper abdomen. 4. Approximate 3.3 cm solitary nodule involving the LEFT lobe of the thyroid gland. Non-emergent thyroid ultrasound is recommended in further evaluation. This follows ACR consensus guidelines: Managing Incidental Thyroid Nodules Detected on Imaging: White Paper of the ACR Incidental Thyroid Findings Committee. J Am Coll Radiol 2015; 12:143-150. 5. Normal heart size with LAD and RIGHT coronary artery atherosclerosis. Aortic Atherosclerosis (ICD10-I70.0) and Emphysema (ICD10-J43.9). These results will be called to the ordering clinician or representative by the Radiologist Assistant, and  communication documented in the PACS or zVision Dashboard. Electronically Signed   By: Evangeline Dakin M.D.   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 Today, we talked to the patient and her sister about the findings and workup thus far.  We discussed the need for additional studies to complete her disease staging which will inform our final treatment recommendations.  She will be scheduled for PET scan, MRI brain and bronchoscopy for tissue confirmation.  We discussed the natural history of lung carcinoma and general treatment, highlighting the role of  radiotherapy in the management. We discussed the available radiation techniques, and focused on the details of logistics and delivery.  Pending findings on upcoming studies, we anticipate the recommendation to proceed with concurrent chemoradiotherapy with an expected 6-week course of daily radiation treatments.  We reviewed the anticipated acute and late sequelae associated with radiation in this setting. The patient was encouraged to ask questions that were answered to her satisfaction.  She understands that findings on upcoming studies may impact/change our final treatment recommendation.  Once we have this additional information we will follow-up with the patient and proceed with treatment planning accordingly.  She appears to have a good understanding of her disease and our recommendations and is in agreement with the stated plan.    Nicholos Johns, PA-C    Tyler Pita, MD  [facility] Direct Dial: [phone]  Fax: [phone] [email or web address]  Skype  LinkedIn

## 2018-06-18 NOTE — Telephone Encounter (Signed)
Printed avs and calender of upcoming appointment. Per 1/2 los 

## 2018-06-20 ENCOUNTER — Encounter: Payer: Self-pay | Admitting: Internal Medicine

## 2018-06-22 ENCOUNTER — Encounter (HOSPITAL_COMMUNITY): Payer: Self-pay | Admitting: *Deleted

## 2018-06-22 ENCOUNTER — Other Ambulatory Visit: Payer: Self-pay

## 2018-06-22 NOTE — Progress Notes (Signed)
Spoke with pt for pre-op call. Pt denies cardiac history or Diabetes. Does have hx of HTN.

## 2018-06-23 ENCOUNTER — Telehealth: Payer: Self-pay | Admitting: Medical Oncology

## 2018-06-23 NOTE — Telephone Encounter (Signed)
Claustrophobic w/ Panic attacks-PET and MRI scheduled on 07/02/2018

## 2018-06-23 NOTE — Telephone Encounter (Signed)
Okay to order 0.5 mg Ativan p.o. before the procedure.  Thank you

## 2018-06-24 ENCOUNTER — Ambulatory Visit (HOSPITAL_COMMUNITY)
Admission: RE | Admit: 2018-06-24 | Discharge: 2018-06-24 | Disposition: A | Discharge status: Home / Self Care | Payer: BLUE CROSS/BLUE SHIELD | Attending: Thoracic Surgery (Cardiothoracic Vascular Surgery) | Admitting: Thoracic Surgery (Cardiothoracic Vascular Surgery)

## 2018-06-24 ENCOUNTER — Ambulatory Visit (HOSPITAL_COMMUNITY): Payer: BLUE CROSS/BLUE SHIELD | Admitting: Anesthesiology

## 2018-06-24 ENCOUNTER — Encounter (HOSPITAL_COMMUNITY): Payer: Self-pay

## 2018-06-24 ENCOUNTER — Ambulatory Visit (HOSPITAL_COMMUNITY): Payer: BLUE CROSS/BLUE SHIELD

## 2018-06-24 ENCOUNTER — Encounter (HOSPITAL_COMMUNITY)
Admission: RE | Disposition: A | Discharge status: Home / Self Care | Payer: Self-pay | Source: Home / Self Care | Attending: Thoracic Surgery (Cardiothoracic Vascular Surgery)

## 2018-06-24 ENCOUNTER — Other Ambulatory Visit: Payer: Self-pay | Admitting: Medical Oncology

## 2018-06-24 ENCOUNTER — Other Ambulatory Visit: Payer: Self-pay

## 2018-06-24 DIAGNOSIS — L049 Acute lymphadenitis, unspecified: Secondary | ICD-10-CM | POA: Diagnosis not present

## 2018-06-24 DIAGNOSIS — Z79899 Other long term (current) drug therapy: Secondary | ICD-10-CM | POA: Insufficient documentation

## 2018-06-24 DIAGNOSIS — Z419 Encounter for procedure for purposes other than remedying health state, unspecified: Secondary | ICD-10-CM

## 2018-06-24 DIAGNOSIS — J439 Emphysema, unspecified: Secondary | ICD-10-CM | POA: Diagnosis not present

## 2018-06-24 DIAGNOSIS — R918 Other nonspecific abnormal finding of lung field: Secondary | ICD-10-CM

## 2018-06-24 DIAGNOSIS — Z91013 Allergy to seafood: Secondary | ICD-10-CM | POA: Insufficient documentation

## 2018-06-24 DIAGNOSIS — Z01818 Encounter for other preprocedural examination: Secondary | ICD-10-CM

## 2018-06-24 DIAGNOSIS — I700 Atherosclerosis of aorta: Secondary | ICD-10-CM | POA: Diagnosis not present

## 2018-06-24 DIAGNOSIS — C3411 Malignant neoplasm of upper lobe, right bronchus or lung: Secondary | ICD-10-CM | POA: Diagnosis not present

## 2018-06-24 DIAGNOSIS — Z87891 Personal history of nicotine dependence: Secondary | ICD-10-CM | POA: Insufficient documentation

## 2018-06-24 DIAGNOSIS — I10 Essential (primary) hypertension: Secondary | ICD-10-CM | POA: Insufficient documentation

## 2018-06-24 DIAGNOSIS — R222 Localized swelling, mass and lump, trunk: Secondary | ICD-10-CM | POA: Diagnosis not present

## 2018-06-24 DIAGNOSIS — E785 Hyperlipidemia, unspecified: Secondary | ICD-10-CM | POA: Diagnosis not present

## 2018-06-24 DIAGNOSIS — Z7982 Long term (current) use of aspirin: Secondary | ICD-10-CM | POA: Insufficient documentation

## 2018-06-24 DIAGNOSIS — C779 Secondary and unspecified malignant neoplasm of lymph node, unspecified: Secondary | ICD-10-CM | POA: Insufficient documentation

## 2018-06-24 DIAGNOSIS — F40240 Claustrophobia: Secondary | ICD-10-CM

## 2018-06-24 DIAGNOSIS — Z86010 Personal history of colonic polyps: Secondary | ICD-10-CM | POA: Diagnosis not present

## 2018-06-24 DIAGNOSIS — E042 Nontoxic multinodular goiter: Secondary | ICD-10-CM | POA: Insufficient documentation

## 2018-06-24 DIAGNOSIS — R590 Localized enlarged lymph nodes: Secondary | ICD-10-CM | POA: Diagnosis present

## 2018-06-24 DIAGNOSIS — Z885 Allergy status to narcotic agent status: Secondary | ICD-10-CM | POA: Diagnosis not present

## 2018-06-24 DIAGNOSIS — Z7951 Long term (current) use of inhaled steroids: Secondary | ICD-10-CM | POA: Diagnosis not present

## 2018-06-24 DIAGNOSIS — R591 Generalized enlarged lymph nodes: Secondary | ICD-10-CM

## 2018-06-24 DIAGNOSIS — Z91018 Allergy to other foods: Secondary | ICD-10-CM | POA: Diagnosis not present

## 2018-06-24 DIAGNOSIS — K219 Gastro-esophageal reflux disease without esophagitis: Secondary | ICD-10-CM | POA: Insufficient documentation

## 2018-06-24 DIAGNOSIS — Z888 Allergy status to other drugs, medicaments and biological substances status: Secondary | ICD-10-CM | POA: Insufficient documentation

## 2018-06-24 DIAGNOSIS — C771 Secondary and unspecified malignant neoplasm of intrathoracic lymph nodes: Secondary | ICD-10-CM | POA: Diagnosis not present

## 2018-06-24 DIAGNOSIS — R599 Enlarged lymph nodes, unspecified: Secondary | ICD-10-CM

## 2018-06-24 HISTORY — PX: VIDEO BRONCHOSCOPY WITH ENDOBRONCHIAL ULTRASOUND: SHX6177

## 2018-06-24 HISTORY — DX: Malignant (primary) neoplasm, unspecified: C80.1

## 2018-06-24 HISTORY — PX: VIDEO BRONCHOSCOPY WITH ENDOBRONCHIAL NAVIGATION: SHX6175

## 2018-06-24 LAB — CBC
HCT: 38.6 % (ref 36.0–46.0)
Hemoglobin: 12.1 g/dL (ref 12.0–15.0)
MCH: 27.1 pg (ref 26.0–34.0)
MCHC: 31.3 g/dL (ref 30.0–36.0)
MCV: 86.5 fL (ref 80.0–100.0)
Platelets: 376 10*3/uL (ref 150–400)
RBC: 4.46 MIL/uL (ref 3.87–5.11)
RDW: 14.9 % (ref 11.5–15.5)
WBC: 7.8 10*3/uL (ref 4.0–10.5)
nRBC: 0 % (ref 0.0–0.2)

## 2018-06-24 LAB — COMPREHENSIVE METABOLIC PANEL
ALT: 11 U/L (ref 0–44)
ANION GAP: 8 (ref 5–15)
AST: 16 U/L (ref 15–41)
Albumin: 3.2 g/dL — ABNORMAL LOW (ref 3.5–5.0)
Alkaline Phosphatase: 83 U/L (ref 38–126)
BUN: 10 mg/dL (ref 8–23)
CO2: 25 mmol/L (ref 22–32)
Calcium: 9.2 mg/dL (ref 8.9–10.3)
Chloride: 107 mmol/L (ref 98–111)
Creatinine, Ser: 0.86 mg/dL (ref 0.44–1.00)
GFR calc Af Amer: >60 mL/min (ref >60)
GFR calc non Af Amer: >60 mL/min (ref >60)
Glucose, Bld: 94 mg/dL (ref 70–99)
Potassium: 3.2 mmol/L — ABNORMAL LOW (ref 3.5–5.1)
Sodium: 140 mmol/L (ref 135–145)
TOTAL PROTEIN: 6.7 g/dL (ref 6.5–8.1)
Total Bilirubin: 0.9 mg/dL (ref 0.3–1.2)

## 2018-06-24 LAB — PROTIME-INR
INR: 0.98
Prothrombin Time: 12.9 seconds (ref 11.4–15.2)

## 2018-06-24 LAB — APTT: APTT: 29 s (ref 24–36)

## 2018-06-24 SURGERY — VIDEO BRONCHOSCOPY WITH ENDOBRONCHIAL NAVIGATION
Anesthesia: General | Site: Chest

## 2018-06-24 MED ORDER — ROCURONIUM BROMIDE 10 MG/ML (PF) SYRINGE
PREFILLED_SYRINGE | INTRAVENOUS | Status: DC | PRN
  Administered 2018-06-24: 50 mg via INTRAVENOUS

## 2018-06-24 MED ORDER — LACTATED RINGERS IV SOLN
INTRAVENOUS | Status: DC | PRN
  Administered 2018-06-24: 12:00:00 via INTRAVENOUS

## 2018-06-24 MED ORDER — EPINEPHRINE PF 1 MG/ML IJ SOLN
INTRAMUSCULAR | Status: DC | PRN
  Administered 2018-06-24: 1 mg

## 2018-06-24 MED ORDER — LORAZEPAM 0.5 MG PO TABS: 0.5000 mg | ORAL_TABLET | Freq: Once | ORAL | 0 refills | Status: AC

## 2018-06-24 MED ORDER — SODIUM CHLORIDE 0.9 % IV SOLN
INTRAVENOUS | Status: DC | PRN
  Administered 2018-06-24: 15 ug/min via INTRAVENOUS

## 2018-06-24 MED ORDER — MEPERIDINE HCL 50 MG/ML IJ SOLN: 6.2500 mg | INTRAMUSCULAR | Status: DC | PRN

## 2018-06-24 MED ORDER — MIDAZOLAM HCL 2 MG/2ML IJ SOLN: 0.5000 mg | Freq: Once | INTRAMUSCULAR | Status: DC | PRN

## 2018-06-24 MED ORDER — LIDOCAINE 2% (20 MG/ML) 5 ML SYRINGE
INTRAMUSCULAR | Status: AC
  Filled 2018-06-24: qty 5

## 2018-06-24 MED ORDER — FENTANYL CITRATE (PF) 250 MCG/5ML IJ SOLN
INTRAMUSCULAR | Status: DC | PRN
  Administered 2018-06-24: 100 ug via INTRAVENOUS

## 2018-06-24 MED ORDER — DEXAMETHASONE SODIUM PHOSPHATE 10 MG/ML IJ SOLN
INTRAMUSCULAR | Status: DC | PRN
  Administered 2018-06-24: 10 mg via INTRAVENOUS

## 2018-06-24 MED ORDER — PROPOFOL 10 MG/ML IV BOLUS
INTRAVENOUS | Status: DC | PRN
  Administered 2018-06-24: 150 mg via INTRAVENOUS

## 2018-06-24 MED ORDER — SUGAMMADEX SODIUM 200 MG/2ML IV SOLN
INTRAVENOUS | Status: DC | PRN
  Administered 2018-06-24: 160 mg via INTRAVENOUS

## 2018-06-24 MED ORDER — FENTANYL CITRATE (PF) 100 MCG/2ML IJ SOLN: 25.0000 ug | INTRAMUSCULAR | Status: DC | PRN

## 2018-06-24 MED ORDER — FENTANYL CITRATE (PF) 250 MCG/5ML IJ SOLN
INTRAMUSCULAR | Status: AC
  Filled 2018-06-24: qty 5

## 2018-06-24 MED ORDER — LIDOCAINE 2% (20 MG/ML) 5 ML SYRINGE
INTRAMUSCULAR | Status: DC | PRN
  Administered 2018-06-24: 100 mg via INTRAVENOUS

## 2018-06-24 MED ORDER — PROMETHAZINE HCL 25 MG/ML IJ SOLN: 6.2500 mg | INTRAMUSCULAR | Status: DC | PRN

## 2018-06-24 MED ORDER — ROCURONIUM BROMIDE 50 MG/5ML IV SOSY
PREFILLED_SYRINGE | INTRAVENOUS | Status: AC
  Filled 2018-06-24: qty 10

## 2018-06-24 MED ORDER — ONDANSETRON HCL 4 MG/2ML IJ SOLN
INTRAMUSCULAR | Status: DC | PRN
  Administered 2018-06-24: 4 mg via INTRAVENOUS

## 2018-06-24 MED ORDER — EPINEPHRINE PF 1 MG/ML IJ SOLN
INTRAMUSCULAR | Status: AC
  Filled 2018-06-24: qty 1

## 2018-06-24 MED ORDER — 0.9 % SODIUM CHLORIDE (POUR BTL) OPTIME
TOPICAL | Status: DC | PRN
  Administered 2018-06-24: 1000 mL

## 2018-06-24 SURGICAL SUPPLY — 56 items
ADAPTER BRONCHOSCOPE OLYMPUS (ADAPTER) ×3 IMPLANT
ADAPTER VALVE BIOPSY EBUS (MISCELLANEOUS) IMPLANT
ADPTR VALVE BIOPSY EBUS (MISCELLANEOUS) ×2
BRUSH BIOPSY BRONCH 10 SDTNB (MISCELLANEOUS) IMPLANT
BRUSH BIOPSY BRONCH 10MM SDTNB (MISCELLANEOUS)
BRUSH CYTOL CELLEBRITY 1.5X140 (MISCELLANEOUS) IMPLANT
BRUSH SUPERTRAX BIOPSY (INSTRUMENTS) ×2 IMPLANT
BRUSH SUPERTRAX NDL-TIP CYTO (INSTRUMENTS) ×3 IMPLANT
CANISTER SUCT 3000ML PPV (MISCELLANEOUS) ×6 IMPLANT
CHANNEL WORK EXTEND EDGE 180 (KITS) IMPLANT
CHANNEL WORK EXTEND EDGE 90 (KITS) IMPLANT
CONT SPEC 4OZ CLIKSEAL STRL BL (MISCELLANEOUS) ×5 IMPLANT
COVER BACK TABLE 60X90IN (DRAPES) ×6 IMPLANT
COVER WAND RF STERILE (DRAPES) ×1 IMPLANT
FILTER STRAW FLUID ASPIR (MISCELLANEOUS) ×2 IMPLANT
FORCEPS BIOP RJ4 1.8 (CUTTING FORCEPS) IMPLANT
FORCEPS BIOP SUPERTRX PREMAR (INSTRUMENTS) IMPLANT
FORCEPS RADIAL JAW LRG 4 PULM (INSTRUMENTS) IMPLANT
GAUZE SPONGE 4X4 12PLY STRL (GAUZE/BANDAGES/DRESSINGS) ×3 IMPLANT
GLOVE SURG SIGNA 7.5 PF LTX (GLOVE) ×6 IMPLANT
GOWN STRL REUS W/ TWL XL LVL3 (GOWN DISPOSABLE) ×2 IMPLANT
GOWN STRL REUS W/TWL XL LVL3 (GOWN DISPOSABLE) ×4
KIT CLEAN ENDO COMPLIANCE (KITS) ×7 IMPLANT
KIT PROCEDURE EDGE 180 (KITS) IMPLANT
KIT PROCEDURE EDGE 90 (KITS) ×2 IMPLANT
KIT TURNOVER KIT B (KITS) ×6 IMPLANT
MARKER SKIN DUAL TIP RULER LAB (MISCELLANEOUS) ×6 IMPLANT
NDL ASPIRATION VIZISHOT 19G (NEEDLE) IMPLANT
NDL ASPIRATION VIZISHOT 21G (NEEDLE) ×1 IMPLANT
NDL BLUNT 18X1 FOR OR ONLY (NEEDLE) IMPLANT
NDL SUPERTRX PREMARK BIOPSY (NEEDLE) IMPLANT
NEEDLE ASPIRATION VIZISHOT 19G (NEEDLE) ×3 IMPLANT
NEEDLE ASPIRATION VIZISHOT 21G (NEEDLE) IMPLANT
NEEDLE BLUNT 18X1 FOR OR ONLY (NEEDLE) IMPLANT
NEEDLE SUPERTRX PREMARK BIOPSY (NEEDLE) IMPLANT
NS IRRIG 1000ML POUR BTL (IV SOLUTION) ×6 IMPLANT
OIL SILICONE PENTAX (PARTS (SERVICE/REPAIRS)) ×6 IMPLANT
PAD ARMBOARD 7.5X6 YLW CONV (MISCELLANEOUS) ×12 IMPLANT
PATCHES PATIENT (LABEL) ×9 IMPLANT
RADIAL JAW LRG 4 PULMONARY (INSTRUMENTS)
SYR 20CC LL (SYRINGE) ×4 IMPLANT
SYR 20ML ECCENTRIC (SYRINGE) ×4 IMPLANT
SYR 30ML LL (SYRINGE) ×1 IMPLANT
SYR 3ML LL SCALE MARK (SYRINGE) ×2 IMPLANT
SYR 5ML LL (SYRINGE) ×2 IMPLANT
SYR 5ML LUER SLIP (SYRINGE) ×1 IMPLANT
TOWEL GREEN STERILE (TOWEL DISPOSABLE) ×6 IMPLANT
TOWEL GREEN STERILE FF (TOWEL DISPOSABLE) ×6 IMPLANT
TRAP SPECIMEN MUCOUS 40CC (MISCELLANEOUS) ×4 IMPLANT
TUBE CONNECTING 20'X1/4 (TUBING) ×1
TUBE CONNECTING 20X1/4 (TUBING) ×2 IMPLANT
UNDERPAD 30X30 (UNDERPADS AND DIAPERS) ×3 IMPLANT
VALVE BIOPSY  SINGLE USE (MISCELLANEOUS) ×4
VALVE BIOPSY SINGLE USE (MISCELLANEOUS) ×2 IMPLANT
VALVE SUCTION BRONCHIO DISP (MISCELLANEOUS) ×8 IMPLANT
WATER STERILE IRR 1000ML POUR (IV SOLUTION) ×4 IMPLANT

## 2018-06-24 NOTE — Anesthesia Postprocedure Evaluation (Signed)
Anesthesia Post Note  Patient: Christina [name]  Procedure(s) Performed: VIDEO BRONCHOSCOPY WITH ENDOBRONCHIAL NAVIGATION with biopsies, right upper lung lobe (N/A Chest) VIDEO BRONCHOSCOPY WITH ENDOBRONCHIAL ULTRASOUND, right upper lung lobe (N/A Chest)     Patient location during evaluation: PACU Anesthesia Type: General Level of consciousness: awake and alert, patient cooperative and oriented Pain management: pain level controlled Vital Signs Assessment: post-procedure vital signs reviewed and stable Respiratory status: spontaneous breathing, nonlabored ventilation, respiratory function stable and patient connected to nasal cannula oxygen Cardiovascular status: blood pressure returned to baseline and stable Postop Assessment: no apparent nausea or vomiting Anesthetic complications: no    Last Vitals:  Vitals:   06/24/18 1415 06/24/18 1440  BP: 122/82 122/79  Pulse: 87 85  Resp: 17 16  Temp: 36.6 C   SpO2: 92% 99%    Last Pain:  Vitals:   06/24/18 1440  TempSrc:   PainSc: 0-No pain                 Renold Kozar,E. Iraida Cragin

## 2018-06-24 NOTE — Anesthesia Preprocedure Evaluation (Addendum)
Anesthesia Evaluation  Patient identified by MRN, date of birth, ID band Patient awake    Reviewed: Allergy & Precautions, NPO status , Patient's Chart, lab work & pertinent test results  History of Anesthesia Complications Negative for: history of anesthetic complications  Airway Mallampati: II  TM Distance: >3 FB Neck ROM: Full    Dental  (+) Edentulous Upper, Missing, Chipped, Poor Dentition, Dental Advisory Given   Pulmonary COPD (last needed inhaler 2 weeks ago),  COPD inhaler, former smoker,  Lung cancer   breath sounds clear to auscultation       Cardiovascular hypertension, Pt. on medications (-) angina Rhythm:Regular Rate:Normal     Neuro/Psych negative neurological ROS     GI/Hepatic Neg liver ROS, GERD  Controlled and Medicated,  Endo/Other  negative endocrine ROS  Renal/GU negative Renal ROS     Musculoskeletal   Abdominal   Peds  Hematology negative hematology ROS (+)   Anesthesia Other Findings   Reproductive/Obstetrics                            Anesthesia Physical Anesthesia Plan  ASA: III  Anesthesia Plan: General   Post-op Pain Management:    Induction: Intravenous  PONV Risk Score and Plan: 3 and Dexamethasone and Ondansetron  Airway Management Planned: Oral ETT  Additional Equipment:   Intra-op Plan:   Post-operative Plan: Extubation in OR  Informed Consent: I have reviewed the patients History and Physical, chart, labs and discussed the procedure including the risks, benefits and alternatives for the proposed anesthesia with the patient or authorized representative who has indicated his/her understanding and acceptance.   Dental advisory given  Plan Discussed with: CRNA and Surgeon  Anesthesia Plan Comments: (Plan routine monitors, GETA)        Anesthesia Quick Evaluation

## 2018-06-24 NOTE — Transfer of Care (Signed)
Immediate Anesthesia Transfer of Care Note  Patient: Christina [name]  Procedure(s) Performed: VIDEO BRONCHOSCOPY WITH ENDOBRONCHIAL NAVIGATION with biopsies, right upper lung lobe (N/A Chest) VIDEO BRONCHOSCOPY WITH ENDOBRONCHIAL ULTRASOUND, right upper lung lobe (N/A Chest)  Patient Location: PACU  Anesthesia Type:General  Level of Consciousness: awake, alert , oriented and patient cooperative  Airway & Oxygen Therapy: Patient Spontanous Breathing  Post-op Assessment: Report given to RN, Post -op Vital signs reviewed and stable and Patient moving all extremities X 4  Post vital signs: Reviewed and stable  Last Vitals:  Vitals Value Taken Time  BP 146/87 06/24/2018  1:46 PM  Temp    Pulse 98 06/24/2018  1:49 PM  Resp 16 06/24/2018  1:49 PM  SpO2 94 % 06/24/2018  1:49 PM  Vitals shown include unvalidated device data.  Last Pain:  Vitals:   06/24/18 1052  TempSrc:   PainSc: 0-No pain      Patients Stated Pain Goal: 0 (50/27/74 1287)  Complications: No apparent anesthesia complications

## 2018-06-24 NOTE — Op Note (Signed)
NAME: Christina [name], AVEDISIAN MEDICAL RECORD [id number] ACCOUNT [id number] DATE OF BIRTH:Nov 21, 1945 FACILITY: [facility] LOCATION: [facility] PHYSICIAN:Tenleigh Byer C. Brendalee Matthies, MD  OPERATIVE REPORT  DATE OF PROCEDURE:  06/24/2018  PREOPERATIVE DIAGNOSIS:  Right upper lobe lung mass with hilar and mediastinal adenopathy.  POSTOPERATIVE DIAGNOSIS:  Lung cancer, clinical stage IIIB- T3, N2.  PROCEDURE:  Electromagnetic navigational bronchoscopy with brushings and transbronchial biopsies and endobronchial ultrasound with mediastinal and hilar lymph node aspirations.  SURGEON:  Modesto Charon, MD  ASSISTANT:  None.  ANESTHESIA:  General.  FINDINGS:  Satisfactory specimens from level 7 node and brushings- malignant cells seen.  Extensive necrosis.  Bleeding with node aspirations and biopsies.  CLINICAL NOTE:  The patient is a 73 year old woman with a history of tobacco abuse who presented with a cough and congestion.  Workup showed a 7 cm right upper lobe lung mass.  There was also bulky hilar and borderline mediastinal adenopathy.  She was  advised to undergo navigational bronchoscopy and endobronchial ultrasound for diagnostic purposes.  The indications, risks, benefits, and alternatives were discussed in detail with the patient.  She understood and accepted the risks and agreed to  proceed.  OPERATIVE NOTE:  The patient was brought to the operating room on 06/24/2018.  The planning for navigational bronchoscopy was done prior to the patient's arrival in the operating room.  She was anesthetized and intubated.  Sequential compression devices  were placed on the calves for DVT prophylaxis.  A timeout was performed.  Flexible fiberoptic bronchoscopy was performed via the endotracheal tube.  It revealed normal endobronchial anatomy with the exception of the anterior segmental bronchus of the  right upper lobe and the right upper lobe carina.  Both were relatively blunted.  There was no endobronchial  lesion, but there was extrinsic deformation in these areas.  There were moderate clear secretions that cleared with saline.  The bronchoscope was removed.  Endobronchial ultrasound probe was placed.  It was advanced to the subcarinal area.  An enlarged node was noted.  A 19-gauge needle was used for aspirations.  Aspirations were performed both with and without suction applied.  With each aspiration, the  needle was advanced into the lymph node with ultrasound visualization, and 15-20 passes were made with the needle in the node.  Next, the probe was moved to the origin of the right upper lobe bronchus, and a markedly enlarged level 11 node was  identified.  Aspiration was performed from this node without suction applied.  There was significant bleeding after the aspiration which required replacement of the regular bronchoscope and irrigation with saline and dilute epinephrine.  The bleeding  ultimately stopped.  By this point, the initial evaluation of the level 7 slides showed adequate specimen with malignant cells present.  Therefore, no additional lymph node aspirations were performed.  The locatable guide for navigation was placed  through the bronchoscope, and registration was performed.  There was good correlation of the video and virtual bronchoscopy.  The bronchoscope was advanced through the right upper lobe bronchus, and the locatable guide was advanced into the appropriate  subsegmental bronchus of the anterior segmental airway.  The locatable guide was directed to the center of the mass within 1.5 cm of the center.  Position was confirmed with fluoroscopy.  Needle brushings then were performed, and these were immediately  evaluated as well.  While awaiting the cytology prep of those slides, multiple biopsies were obtained.  There was some bleeding with the biopsies.  The brushings did again show  malignant cells.  There was significant necrosis.  Multiple additional  biopsies were taken.  The  catheter was withdrawn slightly to get closer to the edge of the tumor, and multiple additional biopsies were taken.  All of the sampling of the right upper lobe mass was done with fluoroscopy visualization.  The total  fluoroscopy time was 3 minutes and 18 seconds.  The sheath then was removed.  There was blood in the airway.  This cleared with irrigation with saline.  There was no ongoing bleeding.  The bronchoscope was removed.  The patient was extubated in the  operating room and taken to the [facility] in good condition.  LN/NUANCE  D:06/24/2018 T:06/24/2018 JOB:[id number]

## 2018-06-24 NOTE — Telephone Encounter (Signed)
Called family member to pick up ativan to take before MRI .

## 2018-06-24 NOTE — Discharge Instructions (Addendum)
Do not drive or engage in heavy physical activity for 24 hours. You may resume normal activities tomorrow  You may cough up small amounts of blood over the next few days.  You may use acetaminophen (Tylenol) if needed for discomfort.  Call [phone] if you develop chest pain, shortness of breath, fever > 101 F or cough up more than 2 tablespoons of blood  Follow up with Dr. Julien Nordmann as scheduled   Post Anesthesia Home Care Instructions  Activity: Get plenty of rest for the remainder of the day. A responsible individual must stay with you for 24 hours following the procedure.  For the next 24 hours, DO NOT: -Drive a car -Paediatric nurse -Drink alcoholic beverages -Take any medication unless instructed by your physician -Make any legal decisions or sign important papers.  Meals: Start with liquid foods such as gelatin or soup. Progress to regular foods as tolerated. Avoid greasy, spicy, heavy foods. If nausea and/or vomiting occur, drink only clear liquids until the nausea and/or vomiting subsides. Call your physician if vomiting continues.  Special Instructions/Symptoms: Your throat may feel dry or sore from the anesthesia or the breathing tube placed in your throat during surgery. If this causes discomfort, gargle with warm salt water. The discomfort should disappear within 24 hours.  If you had a scopolamine patch placed behind your ear for the management of post- operative nausea and/or vomiting:  1. The medication in the patch is effective for 72 hours, after which it should be removed.  Wrap patch in a tissue and discard in the trash. Wash hands thoroughly with soap and water. 2. You may remove the patch earlier than 72 hours if you experience unpleasant side effects which may include dry mouth, dizziness or visual disturbances. 3. Avoid touching the patch. Wash your hands with soap and water after contact with the patch.     Flexible Bronchoscopy, Care After This sheet  gives you information about how to care for yourself after your test. Your doctor may also give you more specific instructions. If you have problems or questions, contact your doctor. Follow these instructions at home: Eating and drinking  Do not eat or drink anything (not even water) for 2 hours after your test, or until your numbing medicine (local anesthetic) wears off.  When your numbness is gone and your cough and gag reflexes have come back, you may: ? Eat only soft foods. ? Slowly drink liquids.  The day after the test, go back to your normal diet. Driving  Do not drive for 24 hours if you were given a medicine to help you relax (sedative).  Do not drive or use heavy machinery while taking prescription pain medicine. General instructions   Take over-the-counter and prescription medicines only as told by your doctor.  Return to your normal activities as told. Ask what activities are safe for you.  Do not use any products that have nicotine or tobacco in them. This includes cigarettes and e-cigarettes. If you need help quitting, ask your doctor.  Keep all follow-up visits as told by your doctor. This is important. It is very important if you had a tissue sample (biopsy) taken. Get help right away if:  You have shortness of breath that gets worse.  You get light-headed.  You feel like you are going to pass out (faint).  You have chest pain.  You cough up: ? More than a little blood. ? More blood than before. Summary  Do not eat or drink  anything (not even water) for 2 hours after your test, or until your numbing medicine wears off.  Do not use cigarettes. Do not use e-cigarettes.  Get help right away if you have chest pain. This information is not intended to replace advice given to you by your health care provider. Make sure you discuss any questions you have with your health care provider. Document Released: 03/31/2009 Document Revised: 06/21/2016 Document  Reviewed: 06/21/2016 Elsevier Interactive Patient Education  2019 [employer].

## 2018-06-24 NOTE — Interval H&P Note (Signed)
History and Physical Interval Note:  06/24/2018 12:19 PM  Christina [name]  has presented today for surgery, with the diagnosis of RIGHT LUNG MASS ADENOPATHY  The various methods of treatment have been discussed with the patient and family. After consideration of risks, benefits and other options for treatment, the patient has consented to  Procedure(s): VIDEO BRONCHOSCOPY WITH ENDOBRONCHIAL NAVIGATION (N/A) VIDEO BRONCHOSCOPY WITH ENDOBRONCHIAL ULTRASOUND (N/A) as a surgical intervention .  The patient's history has been reviewed, patient examined, no change in status, stable for surgery.  I have reviewed the patient's chart and labs.  Questions were answered to the patient's satisfaction.     Melrose Nakayama

## 2018-06-24 NOTE — Brief Op Note (Signed)
06/24/2018  2:03 PM  PATIENT:  Christina [name]  73 y.o. female  PRE-OPERATIVE DIAGNOSIS:  RIGHT LUNG MASS ADENOPATHY  POST-OPERATIVE DIAGNOSIS:  RIGHT LUNG MASS ADENOPATHY  PROCEDURE:  Procedure(s): VIDEO BRONCHOSCOPY WITH ENDOBRONCHIAL NAVIGATION with biopsies, right upper lung lobe (N/A) VIDEO BRONCHOSCOPY WITH ENDOBRONCHIAL ULTRASOUND, right upper lung lobe (N/A)  SURGEON:  Surgeon(s) and Role:    * Melrose Nakayama, MD - Primary  PHYSICIAN ASSISTANT:   ASSISTANTS: none   ANESTHESIA:   general  EBL: minimal  BLOOD ADMINISTERED:none  DRAINS: none   LOCAL MEDICATIONS USED:  NONE  SPECIMEN:  Source of Specimen:  Lymph node aspirations, right upper lobe mass  DISPOSITION OF SPECIMEN:  PATHOLOGY  COUNTS:  NO endo  TOURNIQUET:  * No tourniquets in log *  DICTATION: .Other Dictation: Dictation Number -  PLAN OF CARE: Discharge to home after PACU  PATIENT DISPOSITION:  PACU - hemodynamically stable.   Delay start of Pharmacological VTE agent (>24hrs) due to surgical blood loss or risk of bleeding: not applicable

## 2018-06-24 NOTE — Anesthesia Procedure Notes (Addendum)
Procedure Name: Intubation Date/Time: 06/24/2018 12:51 PM Performed by: Julieta Bellini, CRNA Pre-anesthesia Checklist: Patient identified, Emergency Drugs available, Suction available and Patient being monitored Patient Re-evaluated:Patient Re-evaluated prior to induction Oxygen Delivery Method: Circle system utilized Preoxygenation: Pre-oxygenation with 100% oxygen Induction Type: IV induction Ventilation: Mask ventilation without difficulty and Oral airway inserted - appropriate to patient size Laryngoscope Size: Mac and 3 Grade View: Grade I Tube type: Oral Tube size: 8.0 mm Number of attempts: 1 Airway Equipment and Method: Stylet Placement Confirmation: ETT inserted through vocal cords under direct vision,  positive ETCO2 and breath sounds checked- equal and bilateral Secured at: 21 cm Tube secured with: Tape Dental Injury: Teeth and Oropharynx as per pre-operative assessment

## 2018-06-25 ENCOUNTER — Encounter (HOSPITAL_COMMUNITY): Payer: Self-pay | Admitting: Thoracic Surgery (Cardiothoracic Vascular Surgery)

## 2018-07-01 ENCOUNTER — Other Ambulatory Visit: Payer: Self-pay | Admitting: Family Medicine

## 2018-07-01 DIAGNOSIS — K219 Gastro-esophageal reflux disease without esophagitis: Secondary | ICD-10-CM

## 2018-07-01 NOTE — Telephone Encounter (Signed)
Copied from [place] [phone]. Topic: Quick Communication - Rx Refill/Question >> Jul 01, 2018  8:14 AM Rayann Heman wrote: Medication:omeprazole ([place]) 40 MG capsule [[id number]]   Has the patient contacted their pharmacy?yes / says the pharmacy sent in rx request  Preferred Pharmacy (with phone number or street name): [facility], [place] [phone] (Phone) [phone] (Fax)  Agent: Please be advised that RX refills may take up to 3 business days. We ask that you follow-up with your pharmacy.

## 2018-07-01 NOTE — Telephone Encounter (Signed)
Requested medication (s) are due for refill today -yes  Requested medication (s) are on the active medication list -yes  Future visit scheduled -yes  Last refill: 07/01/17  Notes to clinic: Patient passes protocol -but Rx was written by former provider- sent for review by new PCP   Requested Prescriptions  Pending Prescriptions Disp Refills   omeprazole (PRILOSEC) 40 MG capsule 90 capsule 3    Sig: Take 1 capsule (40 mg total) by mouth daily.     Gastroenterology: Proton Pump Inhibitors Passed - 07/01/2018  8:21 AM      Passed - Valid encounter within last 12 months    Recent Outpatient Visits          1 month ago Acute upper respiratory infection   Primary Care at [facility], Fenton Malling, MD   1 month ago Acute URI   Primary Care at [facility], Arlie Solomons, MD   4 months ago Essential hypertension   Primary Care at [facility], Arlie Solomons, MD   8 months ago Essential hypertension   Primary Care at [facility], [place], [place]   10 months ago Cough   [facility] at [facility], Damaris Hippo, PA-C      Future Appointments            In 1 month [name], MD Primary Care at [place], [facility]            Requested Prescriptions  Pending Prescriptions Disp Refills   omeprazole (PRILOSEC) 40 MG capsule 90 capsule 3    Sig: Take 1 capsule (40 mg total) by mouth daily.     Gastroenterology: Proton Pump Inhibitors Passed - 07/01/2018  8:21 AM      Passed - Valid encounter within last 12 months    Recent Outpatient Visits          1 month ago Acute upper respiratory infection   Primary Care at [facility], Fenton Malling, MD   1 month ago Acute URI   Primary Care at [facility], Arlie Solomons, MD   4 months ago Essential hypertension   Primary Care at [facility], Arlie Solomons, MD   8 months ago Essential hypertension   Primary Care at [facility], [place], [place]   10 months ago Cough   Primary Care at [place], PA-C      Future Appointments            In 1 month  [name], MD Primary Care at [place], [facility]

## 2018-07-02 ENCOUNTER — Ambulatory Visit (HOSPITAL_COMMUNITY)
Admission: RE | Admit: 2018-07-02 | Discharge: 2018-07-02 | Disposition: A | Discharge status: Home / Self Care | Payer: BLUE CROSS/BLUE SHIELD | Source: Ambulatory Visit | Attending: Internal Medicine | Admitting: Internal Medicine

## 2018-07-02 ENCOUNTER — Other Ambulatory Visit: Payer: Self-pay | Admitting: *Deleted

## 2018-07-02 DIAGNOSIS — C3411 Malignant neoplasm of upper lobe, right bronchus or lung: Secondary | ICD-10-CM | POA: Diagnosis not present

## 2018-07-02 DIAGNOSIS — C3490 Malignant neoplasm of unspecified part of unspecified bronchus or lung: Secondary | ICD-10-CM | POA: Insufficient documentation

## 2018-07-02 DIAGNOSIS — C3491 Malignant neoplasm of unspecified part of right bronchus or lung: Secondary | ICD-10-CM | POA: Diagnosis not present

## 2018-07-02 DIAGNOSIS — R911 Solitary pulmonary nodule: Secondary | ICD-10-CM | POA: Diagnosis not present

## 2018-07-02 DIAGNOSIS — C770 Secondary and unspecified malignant neoplasm of lymph nodes of head, face and neck: Secondary | ICD-10-CM | POA: Diagnosis not present

## 2018-07-02 DIAGNOSIS — K219 Gastro-esophageal reflux disease without esophagitis: Secondary | ICD-10-CM

## 2018-07-02 LAB — GLUCOSE, CAPILLARY: Glucose-Capillary: 93 mg/dL (ref 70–99)

## 2018-07-02 MED ORDER — FLUDEOXYGLUCOSE F - 18 (FDG) INJECTION
10.1000 | Freq: Once | INTRAVENOUS | Status: AC
  Administered 2018-07-02: 10.1 via INTRAVENOUS

## 2018-07-02 MED ORDER — GADOBUTROL 1 MMOL/ML IV SOLN
8.0000 mL | Freq: Once | INTRAVENOUS | Status: AC | PRN
  Administered 2018-07-02: 8 mL via INTRAVENOUS

## 2018-07-02 MED ORDER — OMEPRAZOLE 40 MG PO CPDR: 40.0000 mg | DELAYED_RELEASE_CAPSULE | Freq: Every day | ORAL | 0 refills | Status: DC

## 2018-07-02 NOTE — Telephone Encounter (Signed)
Pt called and stated that insurance wont cover a 30 day supply . Called [facility] and gave verbal order to increase dispense 90 tablets.  Dr Nolon Rod called in 11 today. Requested Prescriptions  Pending Prescriptions Disp Refills  . omeprazole (PRILOSEC) 40 MG capsule 90 capsule 0    Sig: Take 1 capsule (40 mg total) by mouth daily.     Gastroenterology: Proton Pump Inhibitors Passed - 07/02/2018 11:47 AM      Passed - Valid encounter within last 12 months    Recent Outpatient Visits          1 month ago Acute upper respiratory infection   Primary Care at [facility], Fenton Malling, MD   1 month ago Acute URI   Primary Care at [facility], Arlie Solomons, MD   4 months ago Essential hypertension   Primary Care at [facility], Arlie Solomons, MD   8 months ago Essential hypertension   Primary Care at [facility], [place], [place]   10 months ago Cough   Primary Care at [place], PA-C      Future Appointments            In 1 month [name], MD Primary Care at [place], [facility]

## 2018-07-02 NOTE — Progress Notes (Signed)
The proposed treatment discussed in cancer conference 07/02/2018 is for discussion purpose only and is not a binding recommendation.  The patient was not physcially examined nor present for their treatment options.  Therefore, final treatment plans cannot be discussed.

## 2018-07-02 NOTE — Telephone Encounter (Signed)
Message from Yvette Rack sent at 07/02/2018 11:27 AM EST   Pt calling stating that insurance want cover a 30 day supply it has to be a 90 day supply on the omeprazole (PRILOSEC) 40 MG capsule   [facility] [place], [place] AT [address] [phone] (Phone) [phone] (Fax)

## 2018-07-06 ENCOUNTER — Encounter: Payer: Self-pay | Admitting: Internal Medicine

## 2018-07-06 ENCOUNTER — Inpatient Hospital Stay (HOSPITAL_BASED_OUTPATIENT_CLINIC_OR_DEPARTMENT_OTHER): Payer: BLUE CROSS/BLUE SHIELD | Admitting: Internal Medicine

## 2018-07-06 ENCOUNTER — Telehealth: Payer: Self-pay | Admitting: Internal Medicine

## 2018-07-06 VITALS — BP 130/80 | HR 80 | Temp 98.4°F | Resp 17 | Ht 67.0 in | Wt 172.5 lb

## 2018-07-06 DIAGNOSIS — C3491 Malignant neoplasm of unspecified part of right bronchus or lung: Secondary | ICD-10-CM | POA: Insufficient documentation

## 2018-07-06 DIAGNOSIS — Z87891 Personal history of nicotine dependence: Secondary | ICD-10-CM

## 2018-07-06 DIAGNOSIS — J910 Malignant pleural effusion: Secondary | ICD-10-CM | POA: Diagnosis not present

## 2018-07-06 DIAGNOSIS — I10 Essential (primary) hypertension: Secondary | ICD-10-CM

## 2018-07-06 DIAGNOSIS — R590 Localized enlarged lymph nodes: Secondary | ICD-10-CM | POA: Diagnosis not present

## 2018-07-06 DIAGNOSIS — C3411 Malignant neoplasm of upper lobe, right bronchus or lung: Secondary | ICD-10-CM

## 2018-07-06 DIAGNOSIS — B1920 Unspecified viral hepatitis C without hepatic coma: Secondary | ICD-10-CM

## 2018-07-06 DIAGNOSIS — J449 Chronic obstructive pulmonary disease, unspecified: Secondary | ICD-10-CM | POA: Diagnosis not present

## 2018-07-06 DIAGNOSIS — R911 Solitary pulmonary nodule: Secondary | ICD-10-CM | POA: Diagnosis not present

## 2018-07-06 DIAGNOSIS — Z5111 Encounter for antineoplastic chemotherapy: Secondary | ICD-10-CM

## 2018-07-06 DIAGNOSIS — Z7189 Other specified counseling: Secondary | ICD-10-CM

## 2018-07-06 MED ORDER — PROCHLORPERAZINE MALEATE 10 MG PO TABS: 10.0000 mg | ORAL_TABLET | Freq: Four times a day (QID) | ORAL | 0 refills | Status: DC | PRN

## 2018-07-06 NOTE — Progress Notes (Signed)
[place] [phone]   [phone]  OFFICE PROGRESS NOTE  [name], MD [place] [address] [place] [address]  DIAGNOSIS: stage IIIB (T4, N2, M0) non-small cell lung cancer, poorly differentiated squamous cell carcinoma.  She presented with large right upper lobe lung mass in addition to right hilar and mediastinal lymphadenopathy diagnosed in January 2020.  PRIOR THERAPY: None  CURRENT THERAPY: Concurrent chemoradiation with weekly carboplatin for AUC of 2 and paclitaxel 45 mg/M2.  First dose July 13, 2018.  INTERVAL HISTORY: Christina [name] 73 y.o. female returns to the clinic today for follow-up visit accompanied by her husband.  The patient is feeling fine today with no concerning complaints.  She denied having any current chest pain but has shortness of breath with exertion with mild cough and no hemoptysis.  She denied having any fever or chills.  She has no nausea, vomiting, diarrhea or constipation.  She denied having any headache or visual changes.  She underwent several studies recently including a PET scan as well as MRI of the brain.  She also had bronchoscopy with endobronchial biopsy by Dr. Roxan Hockey and the final pathology was consistent with poorly differentiated squamous cell carcinoma.  The patient is here today for evaluation and discussion of her imaging studies as well as treatment options.  MEDICAL HISTORY: Past Medical History:  Diagnosis Date  . Allergy   . Cancer ([place])    lung cancer  . GERD (gastroesophageal reflux disease)   . Hyperlipidemia   . Hypertension   . Reflux   . Tubulovillous adenoma of colon 2003    ALLERGIES:  is allergic to coconut oil; codeine; shrimp [shellfish allergy]; and trandolapril.  MEDICATIONS:  Current Outpatient Medications  Medication Sig Dispense Refill  . albuterol (PROAIR HFA) 108 (90 Base) MCG/ACT inhaler INHALE 2 PUFFS INTO THE LUNGS EVERY 6  HOURS AS NEEDED FOR WHEEZING OR SHORTNESS OF BREATH (Patient taking differently: Inhale 2 puffs into the lungs every 6 (six) hours as needed for wheezing or shortness of breath. ) 8.5 g 0  . amLODipine (NORVASC) 10 MG tablet TAKE 1 TABLET(10 MG) BY MOUTH DAILY (Patient taking differently: Take 10 mg by mouth daily. ) 90 tablet 1  . aspirin EC 81 MG tablet Take 81 mg by mouth daily.    Marland Kitchen atorvastatin (LIPITOR) 40 MG tablet Take 1 tablet 40 mg by mouth daily (Patient taking differently: Take 40 mg by mouth every other day. At night) 90 tablet 0  . omeprazole (PRILOSEC) 40 MG capsule Take 1 capsule (40 mg total) by mouth daily. 90 capsule 0  . sucralfate (CARAFATE) 1 g tablet TAKE 1 TABLET BY MOUTH 1 HOUR BEFORE MEALS AND AT BEDTIME AS NEEDED (Patient taking differently: Take 1 g by mouth 4 (four) times daily as needed (stomach acid). ) 120 tablet prn   No current facility-administered medications for this visit.     SURGICAL HISTORY:  Past Surgical History:  Procedure Laterality Date  . CESAREAN SECTION     1 time  . COLONOSCOPY    . VIDEO BRONCHOSCOPY WITH ENDOBRONCHIAL NAVIGATION N/A 06/24/2018   Procedure: VIDEO BRONCHOSCOPY WITH ENDOBRONCHIAL NAVIGATION with biopsies, right upper lung lobe;  Surgeon: Melrose Nakayama, MD;  Location: [facility] OR;  Service: Thoracic;  Laterality: N/A;  . VIDEO BRONCHOSCOPY WITH ENDOBRONCHIAL ULTRASOUND N/A 06/24/2018   Procedure: VIDEO BRONCHOSCOPY WITH ENDOBRONCHIAL ULTRASOUND, right upper lung lobe;  Surgeon: Melrose Nakayama,  MD;  Location: [facility] OR;  Service: Thoracic;  Laterality: N/A;    REVIEW OF SYSTEMS:  Constitutional: negative Eyes: negative Ears, nose, mouth, throat, and face: negative Respiratory: positive for cough and dyspnea on exertion Cardiovascular: negative Gastrointestinal: negative Genitourinary:negative Integument/breast: negative Hematologic/lymphatic: negative Musculoskeletal:negative Neurological: negative Behavioral/Psych:  negative Endocrine: negative Allergic/Immunologic: negative   PHYSICAL EXAMINATION: General appearance: alert, cooperative, fatigued and no distress Head: Normocephalic, without obvious abnormality, atraumatic Neck: no adenopathy, no JVD, supple, symmetrical, trachea midline and thyroid not enlarged, symmetric, no tenderness/mass/nodules Lymph nodes: Cervical, supraclavicular, and axillary nodes normal. Resp: clear to auscultation bilaterally Back: symmetric, no curvature. ROM normal. No CVA tenderness. Cardio: regular rate and rhythm, S1, S2 normal, no murmur, click, rub or gallop GI: soft, non-tender; bowel sounds normal; no masses,  no organomegaly Extremities: extremities normal, atraumatic, no cyanosis or edema Neurologic: Alert and oriented X 3, normal strength and tone. Normal symmetric reflexes. Normal coordination and gait  ECOG PERFORMANCE STATUS: 1 - Symptomatic but completely ambulatory  Blood pressure 130/80, pulse 80, temperature 98.4 F (36.9 C), temperature source Oral, resp. rate 17, height 5\' 7"  (1.702 m), weight 172 lb 8 oz (78.2 kg), SpO2 100 %.  LABORATORY DATA: Lab Results  Component Value Date   WBC 7.8 06/24/2018   HGB 12.1 06/24/2018   HCT 38.6 06/24/2018   MCV 86.5 06/24/2018   PLT 376 06/24/2018      Chemistry      Component Value Date/Time   NA 140 06/24/2018 1055   NA 144 04/02/2017 1435   K 3.2 (L) 06/24/2018 1055   CL 107 06/24/2018 1055   CO2 25 06/24/2018 1055   BUN 10 06/24/2018 1055   BUN 11 04/02/2017 1435   CREATININE 0.86 06/24/2018 1055   CREATININE 0.84 06/18/2018 1336   CREATININE 0.87 03/22/2016 0908      Component Value Date/Time   CALCIUM 9.2 06/24/2018 1055   ALKPHOS 83 06/24/2018 1055   AST 16 06/24/2018 1055   AST 16 06/18/2018 1336   ALT 11 06/24/2018 1055   ALT 13 06/18/2018 1336   BILITOT 0.9 06/24/2018 1055   BILITOT 0.4 06/18/2018 1336       RADIOGRAPHIC STUDIES: Dg Chest 2 View  Result Date:  06/24/2018 CLINICAL DATA:  Right lung mass.  Preoperative biopsy EXAM: CHEST - 2 VIEW COMPARISON:  Chest radiograph May 27, 2018 and chest CT June 02, 2018 FINDINGS: The mass in the anterior segment right upper lobe with associated right hilar adenopathy is again noted. The mass in the anterior segment of the right upper lobe measures 7.5 x 6.6 x 6.5 cm. Lungs elsewhere are clear. Heart size and pulmonary vascularity normal. No adenopathy. There is aortic atherosclerosis. There is upper thoracic levoscoliosis. IMPRESSION: Persistent large anterior segment right upper lobe mass with right hilar adenopathy. No pneumothorax. Lungs elsewhere clear. Heart size normal. There is aortic atherosclerosis. Aortic Atherosclerosis (ICD10-I70.0). Electronically Signed   By: Lowella Grip III M.D.   On: 06/24/2018 11:22   Mr Jeri Cos NI Contrast  Result Date: 07/02/2018 CLINICAL DATA:  73 year old female recently diagnosed with right lung cancer. Staging. EXAM: MRI HEAD WITHOUT AND WITH CONTRAST TECHNIQUE: Multiplanar, multiecho pulse sequences of the brain and surrounding structures were obtained without and with intravenous contrast. CONTRAST:  8 milliliters Gadavist COMPARISON:  PET-CT 07/02/2018. FINDINGS: Brain: No abnormal enhancement identified. No midline shift, mass effect, or evidence of intracranial mass lesion. No dural thickening. No restricted diffusion to suggest acute infarction. No ventriculomegaly, extra-axial  collection or acute intracranial hemorrhage. Cervicomedullary junction and pituitary are within normal limits. Patchy and scattered bilateral cerebral white matter T2 and FLAIR hyperintensity in a nonspecific configuration. Subcortical involvement predominates. No cortical encephalomalacia or chronic cerebral blood products. Mild perivascular spaces in the deep gray matter. Mild patchy T2 and FLAIR hyperintensity in the pons. Vascular: Major intracranial vascular flow voids are preserved. The  major dural venous sinuses are enhancing and appear to be patent. Skull and upper cervical spine: Negative visible cervical spine and spinal cord. Sinuses/Orbits: Normal orbits. Trace paranasal sinus mucosal thickening. Other: Visible internal auditory structures appear normal. Scalp and face soft tissues appear negative. IMPRESSION: 1.  No metastatic disease or acute intracranial abnormality. 2. Moderate nonspecific signal changes in the brain most commonly due to chronic small vessel disease. Electronically Signed   By: Genevie Ann M.D.   On: 07/02/2018 21:30   Nm Pet Image Initial (pi) Skull Base To Thigh  Result Date: 07/02/2018 CLINICAL DATA:  Initial treatment strategy for staging of right upper lobe lung mass. EXAM: NUCLEAR MEDICINE PET SKULL BASE TO THIGH TECHNIQUE: 10.1 mCi F-18 FDG was injected intravenously. Full-ring PET imaging was performed from the skull base to thigh after the radiotracer. CT data was obtained and used for attenuation correction and anatomic localization. Fasting blood glucose: 93 mg/dl COMPARISON:  Chest CT of 06/02/2018 FINDINGS: Mediastinal blood pool activity: SUV max 2.5 NECK: No areas of abnormal hypermetabolism. Incidental CT findings: Bilateral carotid atherosclerosis. No cervical adenopathy. CHEST: Hypermetabolism corresponding to the anterior right upper lobe lung mass. Measures on the order of 9.9 x 5.6 cm and a S.U.V. max of 24.7 including on image 34/8. This is felt to have enlarged compared to 06/02/2018 where it measured 8.1 x 5.0 cm (when remeasured). No gross chest wall invasion or osseous destruction. Mediastinal nodal metastasis, including a high right paratracheal node of 8 mm and a S.U.V. max of 5.9 on image 52/4. A low right paratracheal/precarinal node measures 1.7 cm and a S.U.V. max of 8.6 on image 70/4. Hypermetabolic right hilar adenopathy at 3.5 x 2.5 cm and a S.U.V. max of 11.3. Incidental CT findings: Aortic and coronary artery atherosclerosis.  Left-sided thyroid mass, without significant hypermetabolism, as on prior CT. Mild centrilobular emphysema. ABDOMEN/PELVIS: No abdominopelvic parenchymal or nodal hypermetabolism. Incidental CT findings: Mild bilateral adrenal thickening. Abdominal aortic atherosclerosis. Right common iliac artery ectasia at 1.9 cm. Globular anterior uterus is likely related to a small underlying fibroid. Moderate pelvic floor laxity. SKELETON: No abnormal marrow activity. Incidental CT findings: none IMPRESSION: 1. Right upper lobe primary bronchogenic carcinoma with ipsilateral mediastinal and hilar nodal metastasis. Presuming non-small-cell histology, T4N2M0 or stage IIIB. 2. Aortic atherosclerosis (ICD10-I70.0), coronary artery atherosclerosis and emphysema (ICD10-J43.9). 3. Incidental findings, including right common iliac artery ectasia, probable uterine fibroid, left thyroid lesion, and pelvic floor laxity. Electronically Signed   By: Abigail Miyamoto M.D.   On: 07/02/2018 15:07   Dg C-arm Bronchoscopy  Result Date: 06/24/2018 C-ARM BRONCHOSCOPY: Fluoroscopy was utilized by the requesting physician.  No radiographic interpretation.    ASSESSMENT AND PLAN: This is a very pleasant 73 years old African-American female recently diagnosed with a stage IIIB (T4, N2, M0) non-small cell lung cancer, poorly differentiated squamous cell carcinoma presented with right upper lobe lung mass in addition to right hilar and mediastinal lymphadenopathy. I personally and independently reviewed her scan images and discussed the results with the patient and her husband. I recommended for the patient a course of concurrent chemoradiation  with weekly carboplatin for AUC of 2 and paclitaxel 45 mg/M2.  This will be followed by consolidation immunotherapy if the patient has no evidence for disease progression after the induction phase. I discussed with the patient the adverse effect of this treatment including but not limited to alopecia,  myelosuppression, nausea and vomiting, peripheral neuropathy, liver or renal dysfunction. The patient is expected to start the first dose of this treatment on July 13, 2018. I will arrange for the patient to have a chemotherapy education class before her first dose of treatment. I will send prescription of Compazine to her pharmacy to be used as needed for nausea. The patient will come back for follow-up visit in 2 weeks for evaluation before starting cycle #2. She was advised to call immediately if she has any concerning symptoms in the interval. The patient voices understanding of current disease status and treatment options and is in agreement with the current care plan.  All questions were answered. The patient knows to call the clinic with any problems, questions or concerns. We can certainly see the patient much sooner if necessary.  Disclaimer: This note was dictated with voice recognition software. Similar sounding words can inadvertently be transcribed and may not be corrected upon review.

## 2018-07-06 NOTE — Telephone Encounter (Signed)
Printed calendar and avs.  Added first treatment to the books, for approval.

## 2018-07-06 NOTE — Progress Notes (Signed)
START ON PATHWAY REGIMEN - Non-Small Cell Lung     Administer weekly:     Paclitaxel      Carboplatin   **Always confirm dose/schedule in your pharmacy ordering system**  Patient Characteristics: Stage III - Unresectable, PS = 0, 1 AJCC T Category: T4 Current Disease Status: No Distant Mets or Local Recurrence AJCC N Category: N2 AJCC M Category: M0 AJCC 8 Stage Grouping: IIIB Performance Status: PS = 0, 1 Intent of Therapy: Curative Intent, Discussed with Patient

## 2018-07-07 ENCOUNTER — Encounter: Payer: Self-pay | Admitting: Urology

## 2018-07-07 NOTE — Progress Notes (Signed)
Final pathology was reviewed from recent electromagnetic navigational bronchoscopy with brushings and transbronchial biopsies and endobronchial ultrasound with mediastinal and hilar lymph node aspirations performed by Dr. 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  Her recent PET imaging from 07/02/2018 was consistent with locally advanced disease only, no evidence for distant metastatic disease.  She also had an MRI of the brain on 07/02/2018 which was negative for any metastatic disease or acute intracranial abnormalities.  She has met with Dr. Earlie Server on 07/06/2018 to review all of her studies to date and the decision is to move forward with concurrent chemoradiation as initially discussed at the time of lung clinic on 06/18/2018.  Her first dose of chemotherapy is scheduled for 07/21/2018, therefore, we will reach out to the patient to get her scheduled for CT simulation/treatment planning this week in anticipation of beginning radiotherapy concurrent with chemotherapy on 07/21/2018.  Nicholos Johns, MMS, PA-C [facility] [place]: [phone]  Fax: [phone]

## 2018-07-08 ENCOUNTER — Telehealth: Payer: Self-pay | Admitting: Internal Medicine

## 2018-07-08 NOTE — Telephone Encounter (Signed)
Called patient to let her know that her treatment for 01/27 has been added.  Patient aware of appt, and stated she will get a print out when she comes to her chemo edu class on 01/24.

## 2018-07-10 ENCOUNTER — Ambulatory Visit
Admission: RE | Admit: 2018-07-10 | Discharge: 2018-07-10 | Disposition: A | Discharge status: Home / Self Care | Payer: BLUE CROSS/BLUE SHIELD | Source: Ambulatory Visit | Attending: Radiation Oncology | Admitting: Radiation Oncology

## 2018-07-10 ENCOUNTER — Inpatient Hospital Stay: Payer: BLUE CROSS/BLUE SHIELD

## 2018-07-10 DIAGNOSIS — Z510 Encounter for antineoplastic radiation therapy: Secondary | ICD-10-CM | POA: Diagnosis not present

## 2018-07-10 DIAGNOSIS — C3491 Malignant neoplasm of unspecified part of right bronchus or lung: Secondary | ICD-10-CM | POA: Insufficient documentation

## 2018-07-10 DIAGNOSIS — C3411 Malignant neoplasm of upper lobe, right bronchus or lung: Secondary | ICD-10-CM | POA: Diagnosis not present

## 2018-07-10 LAB — CBC WITH DIFFERENTIAL (CANCER CENTER ONLY)
Abs Immature Granulocytes: 0.03 10*3/uL (ref 0.00–0.07)
Basophils Absolute: 0.1 10*3/uL (ref 0.0–0.1)
Basophils Relative: 1 %
Eosinophils Absolute: 0.2 10*3/uL (ref 0.0–0.5)
Eosinophils Relative: 2 %
HCT: 36.9 % (ref 36.0–46.0)
Hemoglobin: 12 g/dL (ref 12.0–15.0)
Immature Granulocytes: 0 %
Lymphocytes Relative: 27 %
Lymphs Abs: 2 10*3/uL (ref 0.7–4.0)
MCH: 28 pg (ref 26.0–34.0)
MCHC: 32.5 g/dL (ref 30.0–36.0)
MCV: 86 fL (ref 80.0–100.0)
MONOS PCT: 9 %
Monocytes Absolute: 0.6 10*3/uL (ref 0.1–1.0)
NEUTROS PCT: 61 %
Neutro Abs: 4.4 10*3/uL (ref 1.7–7.7)
Platelet Count: 403 10*3/uL — ABNORMAL HIGH (ref 150–400)
RBC: 4.29 MIL/uL (ref 3.87–5.11)
RDW: 15 % (ref 11.5–15.5)
WBC Count: 7.3 10*3/uL (ref 4.0–10.5)
nRBC: 0 % (ref 0.0–0.2)

## 2018-07-10 LAB — CMP (CANCER CENTER ONLY)
ALT: 9 U/L (ref 0–44)
ANION GAP: 8 (ref 5–15)
AST: 14 U/L — ABNORMAL LOW (ref 15–41)
Albumin: 3.1 g/dL — ABNORMAL LOW (ref 3.5–5.0)
Alkaline Phosphatase: 101 U/L (ref 38–126)
BUN: 15 mg/dL (ref 8–23)
CO2: 26 mmol/L (ref 22–32)
Calcium: 9.2 mg/dL (ref 8.9–10.3)
Chloride: 108 mmol/L (ref 98–111)
Creatinine: 0.87 mg/dL (ref 0.44–1.00)
GFR, Est AFR Am: >60 mL/min (ref >60)
GFR, Estimated: >60 mL/min (ref >60)
GLUCOSE: 96 mg/dL (ref 70–99)
Potassium: 3.5 mmol/L (ref 3.5–5.1)
Sodium: 142 mmol/L (ref 135–145)
Total Bilirubin: 0.5 mg/dL (ref 0.3–1.2)
Total Protein: 7.1 g/dL (ref 6.5–8.1)

## 2018-07-10 NOTE — Progress Notes (Signed)
  Radiation Oncology         [phone] ________________________________  Name: MIKINZIE MACIEJEWSKI MRN: [id number]  Date: 07/10/2018  DOB: 04-Dec-1945  SIMULATION AND TREATMENT PLANNING NOTE    ICD-10-CM   1. 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 woman with stage IIIB, pT4N2 NSCLC  NARRATIVE:  The patient was brought to the [place].  Identity was confirmed.  All relevant records and images related to the planned course of therapy were reviewed.  The patient freely provided informed written consent to proceed with treatment after reviewing the details related to the planned course of therapy. The consent form was witnessed and verified by the simulation staff.  Then, the patient was set-up in a stable reproducible  supine position for radiation therapy.  CT images were obtained.  Surface markings were placed.  The CT images were loaded into the planning software.  Then the target and avoidance structures were contoured.  Treatment planning then occurred.  The radiation prescription was entered and confirmed.  Then, I designed and supervised the construction of a total of 6 medically necessary complex treatment devices, including a BodyFix immobilization mold custom fitted to the patient along with 5 multileaf collimators conformally shaped radiation around the treatment target while shielding critical structures such as the heart and spinal cord maximally.  I have requested : 3D Simulation  I have requested a DVH of the following structures: Left lung, right lung, spinal cord, heart, esophagus, and target.  I have ordered:Nutrition Consult  SPECIAL TREATMENT PROCEDURE:  The planned course of therapy using radiation constitutes a special treatment procedure. Special care is required in the management of this patient for the following reasons.  The patient will be receiving concurrent chemotherapy requiring careful monitoring for increased  toxicities of treatment including periodic laboratory values.  The special nature of the planned course of radiotherapy will require increased physician supervision and oversight to ensure patient's safety with optimal treatment outcomes.  PLAN:  The patient will receive 66 Gy in 33 fractions.  ________________________________  Sheral Apley Tammi Klippel, M.D.  This document serves as a record of services personally performed by Tyler Pita, MD. It was created on his behalf by Wilburn Mylar, a trained medical scribe. The creation of this record is based on the scribe's personal observations and the provider's statements to them. This document has been checked and approved by the attending provider.

## 2018-07-13 ENCOUNTER — Inpatient Hospital Stay: Payer: BLUE CROSS/BLUE SHIELD

## 2018-07-13 ENCOUNTER — Encounter: Payer: Self-pay | Admitting: Internal Medicine

## 2018-07-13 VITALS — BP 135/91 | HR 70 | Temp 98.6°F | Resp 16

## 2018-07-13 DIAGNOSIS — C3491 Malignant neoplasm of unspecified part of right bronchus or lung: Secondary | ICD-10-CM | POA: Diagnosis not present

## 2018-07-13 DIAGNOSIS — Z510 Encounter for antineoplastic radiation therapy: Secondary | ICD-10-CM | POA: Diagnosis not present

## 2018-07-13 MED ORDER — SODIUM CHLORIDE 0.9 % IV SOLN
179.2000 mg | Freq: Once | INTRAVENOUS | Status: AC
  Administered 2018-07-13: 180 mg via INTRAVENOUS
  Filled 2018-07-13: qty 18

## 2018-07-13 MED ORDER — SODIUM CHLORIDE 0.9 % IV SOLN
45.0000 mg/m2 | Freq: Once | INTRAVENOUS | Status: AC
  Administered 2018-07-13: 84 mg via INTRAVENOUS
  Filled 2018-07-13: qty 14

## 2018-07-13 MED ORDER — SODIUM CHLORIDE 0.9 % IV SOLN
Freq: Once | INTRAVENOUS | Status: AC
  Administered 2018-07-13: 09:00:00 via INTRAVENOUS
  Filled 2018-07-13: qty 250

## 2018-07-13 MED ORDER — FAMOTIDINE IN NACL 20-0.9 MG/50ML-% IV SOLN
20.0000 mg | Freq: Once | INTRAVENOUS | Status: AC
  Administered 2018-07-13: 20 mg via INTRAVENOUS

## 2018-07-13 MED ORDER — DIPHENHYDRAMINE HCL 50 MG/ML IJ SOLN
INTRAMUSCULAR | Status: AC
  Filled 2018-07-13: qty 1

## 2018-07-13 MED ORDER — SODIUM CHLORIDE 0.9 % IV SOLN
20.0000 mg | Freq: Once | INTRAVENOUS | Status: AC
  Administered 2018-07-13: 20 mg via INTRAVENOUS
  Filled 2018-07-13: qty 2

## 2018-07-13 MED ORDER — FAMOTIDINE IN NACL 20-0.9 MG/50ML-% IV SOLN
INTRAVENOUS | Status: AC
  Filled 2018-07-13: qty 50

## 2018-07-13 MED ORDER — PALONOSETRON HCL INJECTION 0.25 MG/5ML
0.2500 mg | Freq: Once | INTRAVENOUS | Status: AC
  Administered 2018-07-13: 0.25 mg via INTRAVENOUS

## 2018-07-13 MED ORDER — PALONOSETRON HCL INJECTION 0.25 MG/5ML
INTRAVENOUS | Status: AC
  Filled 2018-07-13: qty 5

## 2018-07-13 MED ORDER — DIPHENHYDRAMINE HCL 50 MG/ML IJ SOLN
50.0000 mg | Freq: Once | INTRAMUSCULAR | Status: AC
  Administered 2018-07-13: 50 mg via INTRAVENOUS

## 2018-07-13 NOTE — Patient Instructions (Addendum)
[facility] Discharge Instructions for Patients Receiving Chemotherapy  Today you received the following chemotherapy agents :  Taxol, Carboplatin.  To help prevent nausea and vomiting after your treatment, we encourage you to take your nausea medication as prescribed.  TAKE COMPAZINE 10 MG EVERY 6 HOURS AS NEEDED FOR NAUSEA.  DO NOT DRIVE AFTER TAKING THIS MEDICATION AS IT CAN CAUSE DROWSINESS. DO NOT EAT GREASY NOR SPICY FOODS.  DO DRINK LOTS OF FLUIDS AS TOLERATED.   If you develop nausea and vomiting that is not controlled by your nausea medication, call the clinic.   BELOW ARE SYMPTOMS THAT SHOULD BE REPORTED IMMEDIATELY:  *FEVER GREATER THAN 100.5 F  *CHILLS WITH OR WITHOUT FEVER  NAUSEA AND VOMITING THAT IS NOT CONTROLLED WITH YOUR NAUSEA MEDICATION  *UNUSUAL SHORTNESS OF BREATH  *UNUSUAL BRUISING OR BLEEDING  TENDERNESS IN MOUTH AND THROAT WITH OR WITHOUT PRESENCE OF ULCERS  *URINARY PROBLEMS  *BOWEL PROBLEMS  UNUSUAL RASH Items with * indicate a potential emergency and should be followed up as soon as possible.  Feel free to call the clinic should you have any questions or concerns. The clinic phone number is [phone].  Please show the [facility] at check-in to the Emergency Department and triage nurse.   Paclitaxel injection What is this medicine? PACLITAXEL (PAK li TAX el) is a chemotherapy drug. It targets fast dividing cells, like cancer cells, and causes these cells to die. This medicine is used to treat ovarian cancer, breast cancer, lung cancer, Kaposi's sarcoma, and other cancers. This medicine may be used for other purposes; ask your health care provider or pharmacist if you have questions. COMMON BRAND NAME(S): Onxol, Taxol What should I tell my health care provider before I take this medicine? They need to know if you have any of these conditions: -history of irregular heartbeat -liver disease -low blood counts, like low  white cell, platelet, or red cell counts -lung or breathing disease, like asthma -tingling of the fingers or toes, or other nerve disorder -an unusual or allergic reaction to paclitaxel, alcohol, polyoxyethylated castor oil, other chemotherapy, other medicines, foods, dyes, or preservatives -pregnant or trying to get pregnant -breast-feeding How should I use this medicine? This drug is given as an infusion into a vein. It is administered in a hospital or clinic by a specially trained health care professional. Talk to your pediatrician regarding the use of this medicine in children. Special care may be needed. Overdosage: If you think you have taken too much of this medicine contact a poison control center or emergency room at once. NOTE: This medicine is only for you. Do not share this medicine with others. What if I miss a dose? It is important not to miss your dose. Call your doctor or health care professional if you are unable to keep an appointment. What may interact with this medicine? Do not take this medicine with any of the following medications: -disulfiram -metronidazole This medicine may also interact with the following medications: -antiviral medicines for hepatitis, HIV or AIDS -certain antibiotics like erythromycin and clarithromycin -certain medicines for fungal infections like ketoconazole and itraconazole -certain medicines for seizures like carbamazepine, phenobarbital, phenytoin -gemfibrozil -nefazodone -rifampin -St. John's wort This list may not describe all possible interactions. Give your health care provider a list of all the medicines, herbs, non-prescription drugs, or dietary supplements you use. Also tell them if you smoke, drink alcohol, or use illegal drugs. Some items may interact with your medicine. What  should I watch for while using this medicine? Your condition will be monitored carefully while you are receiving this medicine. You will need important  blood work done while you are taking this medicine. This medicine can cause serious allergic reactions. To reduce your risk you will need to take other medicine(s) before treatment with this medicine. If you experience allergic reactions like skin rash, itching or hives, swelling of the face, lips, or tongue, tell your doctor or health care professional right away. In some cases, you may be given additional medicines to help with side effects. Follow all directions for their use. This drug may make you feel generally unwell. This is not uncommon, as chemotherapy can affect healthy cells as well as cancer cells. Report any side effects. Continue your course of treatment even though you feel ill unless your doctor tells you to stop. Call your doctor or health care professional for advice if you get a fever, chills or sore throat, or other symptoms of a cold or flu. Do not treat yourself. This drug decreases your body's ability to fight infections. Try to avoid being around people who are sick. This medicine may increase your risk to bruise or bleed. Call your doctor or health care professional if you notice any unusual bleeding. Be careful brushing and flossing your teeth or using a toothpick because you may get an infection or bleed more easily. If you have any dental work done, tell your dentist you are receiving this medicine. Avoid taking products that contain aspirin, acetaminophen, ibuprofen, naproxen, or ketoprofen unless instructed by your doctor. These medicines may hide a fever. Do not become pregnant while taking this medicine. Women should inform their doctor if they wish to become pregnant or think they might be pregnant. There is a potential for serious side effects to an unborn child. Talk to your health care professional or pharmacist for more information. Do not breast-feed an infant while taking this medicine. Men are advised not to father a child while receiving this medicine. This product  may contain alcohol. Ask your pharmacist or healthcare provider if this medicine contains alcohol. Be sure to tell all healthcare providers you are taking this medicine. Certain medicines, like metronidazole and disulfiram, can cause an unpleasant reaction when taken with alcohol. The reaction includes flushing, headache, nausea, vomiting, sweating, and increased thirst. The reaction can last from 30 minutes to several hours. What side effects may I notice from receiving this medicine? Side effects that you should report to your doctor or health care professional as soon as possible: -allergic reactions like skin rash, itching or hives, swelling of the face, lips, or tongue -breathing problems -changes in vision -fast, irregular heartbeat -high or low blood pressure -mouth sores -pain, tingling, numbness in the hands or feet -signs of decreased platelets or bleeding - bruising, pinpoint red spots on the skin, black, tarry stools, blood in the urine -signs of decreased red blood cells - unusually weak or tired, feeling faint or lightheaded, falls -signs of infection - fever or chills, cough, sore throat, pain or difficulty passing urine -signs and symptoms of liver injury like dark yellow or brown urine; general ill feeling or flu-like symptoms; light-colored stools; loss of appetite; nausea; right upper belly pain; unusually weak or tired; yellowing of the eyes or skin -swelling of the ankles, feet, hands -unusually slow heartbeat Side effects that usually do not require medical attention (report to your doctor or health care professional if they continue or are bothersome): -diarrhea -hair  loss -loss of appetite -muscle or joint pain -nausea, vomiting -pain, redness, or irritation at site where injected -tiredness This list may not describe all possible side effects. Call your doctor for medical advice about side effects. You may report side effects to FDA at 1-800-FDA-1088. Where should I  keep my medicine? This drug is given in a hospital or clinic and will not be stored at home. NOTE: This sheet is a summary. It may not cover all possible information. If you have questions about this medicine, talk to your doctor, pharmacist, or health care provider.  2019 Elsevier/Gold Standard (2017-02-04 13:14:55)

## 2018-07-13 NOTE — Progress Notes (Signed)
Went to infusion to introduce myself as Arboriculturist and to offer available resources.  Discussed one-time $63 Engineer, drilling to assist with personal expenses while going through treatment. Gave her my card for any additional financial questions or concerns.

## 2018-07-15 ENCOUNTER — Telehealth: Payer: Self-pay | Admitting: *Deleted

## 2018-07-15 NOTE — Telephone Encounter (Signed)
-----   Message from Jarvis Morgan, RN sent at 07/13/2018  9:28 AM EST ----- Regarding: Chemo follow up [facility] time Taxol, Carboplatin, Dr. Vista Mink, TB, RN.

## 2018-07-15 NOTE — Telephone Encounter (Signed)
TCT patient to follow up with her after her 1st Taxol/Carbo treatment on the 27th.Christina [name] with patient.  She states she is feeling well. Denies any fever, nausea ect.  Eating and drinking fluids well.  She is aware of her upcoming appts. No further questions or concerns.

## 2018-07-18 DIAGNOSIS — C3411 Malignant neoplasm of upper lobe, right bronchus or lung: Secondary | ICD-10-CM | POA: Insufficient documentation

## 2018-07-18 DIAGNOSIS — Z510 Encounter for antineoplastic radiation therapy: Secondary | ICD-10-CM | POA: Insufficient documentation

## 2018-07-21 ENCOUNTER — Ambulatory Visit
Admission: RE | Admit: 2018-07-21 | Discharge: 2018-07-21 | Disposition: A | Discharge status: Home / Self Care | Payer: BLUE CROSS/BLUE SHIELD | Source: Ambulatory Visit | Attending: Radiation Oncology | Admitting: Radiation Oncology

## 2018-07-21 ENCOUNTER — Inpatient Hospital Stay (HOSPITAL_BASED_OUTPATIENT_CLINIC_OR_DEPARTMENT_OTHER): Payer: BLUE CROSS/BLUE SHIELD | Admitting: Oncology

## 2018-07-21 ENCOUNTER — Inpatient Hospital Stay: Payer: BLUE CROSS/BLUE SHIELD

## 2018-07-21 ENCOUNTER — Encounter: Payer: Self-pay | Admitting: *Deleted

## 2018-07-21 ENCOUNTER — Encounter: Payer: Self-pay | Admitting: Oncology

## 2018-07-21 VITALS — BP 151/91 | HR 70 | Temp 97.8°F | Resp 18 | Ht 67.0 in | Wt 177.4 lb

## 2018-07-21 DIAGNOSIS — Z510 Encounter for antineoplastic radiation therapy: Secondary | ICD-10-CM | POA: Diagnosis not present

## 2018-07-21 DIAGNOSIS — Z7982 Long term (current) use of aspirin: Secondary | ICD-10-CM

## 2018-07-21 DIAGNOSIS — I6523 Occlusion and stenosis of bilateral carotid arteries: Secondary | ICD-10-CM

## 2018-07-21 DIAGNOSIS — C771 Secondary and unspecified malignant neoplasm of intrathoracic lymph nodes: Secondary | ICD-10-CM

## 2018-07-21 DIAGNOSIS — Z79899 Other long term (current) drug therapy: Secondary | ICD-10-CM | POA: Insufficient documentation

## 2018-07-21 DIAGNOSIS — C3491 Malignant neoplasm of unspecified part of right bronchus or lung: Secondary | ICD-10-CM

## 2018-07-21 DIAGNOSIS — K219 Gastro-esophageal reflux disease without esophagitis: Secondary | ICD-10-CM | POA: Insufficient documentation

## 2018-07-21 DIAGNOSIS — Z5111 Encounter for antineoplastic chemotherapy: Secondary | ICD-10-CM

## 2018-07-21 DIAGNOSIS — I2510 Atherosclerotic heart disease of native coronary artery without angina pectoris: Secondary | ICD-10-CM | POA: Insufficient documentation

## 2018-07-21 DIAGNOSIS — E079 Disorder of thyroid, unspecified: Secondary | ICD-10-CM

## 2018-07-21 DIAGNOSIS — C3411 Malignant neoplasm of upper lobe, right bronchus or lung: Secondary | ICD-10-CM | POA: Diagnosis not present

## 2018-07-21 DIAGNOSIS — I10 Essential (primary) hypertension: Secondary | ICD-10-CM | POA: Insufficient documentation

## 2018-07-21 DIAGNOSIS — E785 Hyperlipidemia, unspecified: Secondary | ICD-10-CM

## 2018-07-21 LAB — CMP (CANCER CENTER ONLY)
ALT: 10 U/L (ref 0–44)
ANION GAP: 7 (ref 5–15)
AST: 14 U/L — ABNORMAL LOW (ref 15–41)
Albumin: 3.2 g/dL — ABNORMAL LOW (ref 3.5–5.0)
Alkaline Phosphatase: 94 U/L (ref 38–126)
BUN: 12 mg/dL (ref 8–23)
CO2: 28 mmol/L (ref 22–32)
Calcium: 9 mg/dL (ref 8.9–10.3)
Chloride: 106 mmol/L (ref 98–111)
Creatinine: 0.78 mg/dL (ref 0.44–1.00)
GFR, Est AFR Am: >60 mL/min (ref >60)
GFR, Estimated: >60 mL/min (ref >60)
Glucose, Bld: 81 mg/dL (ref 70–99)
Potassium: 3.8 mmol/L (ref 3.5–5.1)
Sodium: 141 mmol/L (ref 135–145)
Total Bilirubin: 0.3 mg/dL (ref 0.3–1.2)
Total Protein: 6.8 g/dL (ref 6.5–8.1)

## 2018-07-21 LAB — CBC WITH DIFFERENTIAL (CANCER CENTER ONLY)
Abs Immature Granulocytes: 0.03 10*3/uL (ref 0.00–0.07)
Basophils Absolute: 0.1 10*3/uL (ref 0.0–0.1)
Basophils Relative: 1 %
Eosinophils Absolute: 0.2 10*3/uL (ref 0.0–0.5)
Eosinophils Relative: 3 %
HCT: 36.2 % (ref 36.0–46.0)
Hemoglobin: 11.5 g/dL — ABNORMAL LOW (ref 12.0–15.0)
Immature Granulocytes: 0 %
LYMPHS PCT: 43 %
Lymphs Abs: 3 10*3/uL (ref 0.7–4.0)
MCH: 28 pg (ref 26.0–34.0)
MCHC: 31.8 g/dL (ref 30.0–36.0)
MCV: 88.1 fL (ref 80.0–100.0)
Monocytes Absolute: 0.6 10*3/uL (ref 0.1–1.0)
Monocytes Relative: 9 %
Neutro Abs: 3.1 10*3/uL (ref 1.7–7.7)
Neutrophils Relative %: 44 %
Platelet Count: 401 10*3/uL — ABNORMAL HIGH (ref 150–400)
RBC: 4.11 MIL/uL (ref 3.87–5.11)
RDW: 15.9 % — AB (ref 11.5–15.5)
WBC Count: 7 10*3/uL (ref 4.0–10.5)
nRBC: 0 % (ref 0.0–0.2)

## 2018-07-21 MED ORDER — SODIUM CHLORIDE 0.9 % IV SOLN
20.0000 mg | Freq: Once | INTRAVENOUS | Status: AC
  Administered 2018-07-21: 20 mg via INTRAVENOUS
  Filled 2018-07-21: qty 2

## 2018-07-21 MED ORDER — FAMOTIDINE IN NACL 20-0.9 MG/50ML-% IV SOLN
20.0000 mg | Freq: Once | INTRAVENOUS | Status: AC
  Administered 2018-07-21: 20 mg via INTRAVENOUS

## 2018-07-21 MED ORDER — DIPHENHYDRAMINE HCL 50 MG/ML IJ SOLN
INTRAMUSCULAR | Status: AC
  Filled 2018-07-21: qty 1

## 2018-07-21 MED ORDER — PALONOSETRON HCL INJECTION 0.25 MG/5ML
0.2500 mg | Freq: Once | INTRAVENOUS | Status: AC
  Administered 2018-07-21: 0.25 mg via INTRAVENOUS

## 2018-07-21 MED ORDER — FAMOTIDINE IN NACL 20-0.9 MG/50ML-% IV SOLN
INTRAVENOUS | Status: AC
  Filled 2018-07-21: qty 50

## 2018-07-21 MED ORDER — SODIUM CHLORIDE 0.9 % IV SOLN
Freq: Once | INTRAVENOUS | Status: AC
  Administered 2018-07-21: 15:00:00 via INTRAVENOUS
  Filled 2018-07-21: qty 250

## 2018-07-21 MED ORDER — DIPHENHYDRAMINE HCL 50 MG/ML IJ SOLN
50.0000 mg | Freq: Once | INTRAMUSCULAR | Status: AC
  Administered 2018-07-21: 50 mg via INTRAVENOUS

## 2018-07-21 MED ORDER — PALONOSETRON HCL INJECTION 0.25 MG/5ML
INTRAVENOUS | Status: AC
  Filled 2018-07-21: qty 5

## 2018-07-21 MED ORDER — SODIUM CHLORIDE 0.9 % IV SOLN
45.0000 mg/m2 | Freq: Once | INTRAVENOUS | Status: AC
  Administered 2018-07-21: 84 mg via INTRAVENOUS
  Filled 2018-07-21: qty 14

## 2018-07-21 MED ORDER — DEXAMETHASONE SODIUM PHOSPHATE 10 MG/ML IJ SOLN
INTRAMUSCULAR | Status: AC
  Filled 2018-07-21: qty 2

## 2018-07-21 MED ORDER — SODIUM CHLORIDE 0.9 % IV SOLN
179.2000 mg | Freq: Once | INTRAVENOUS | Status: AC
  Administered 2018-07-21: 180 mg via INTRAVENOUS
  Filled 2018-07-21: qty 18

## 2018-07-21 NOTE — Assessment & Plan Note (Signed)
This is a very pleasant 73 year old African-American female recently diagnosed with a stage IIIB (T4, N2, M0) non-small cell lung cancer, poorly differentiated squamous cell carcinoma presented with right upper lobe lung mass in addition to right hilar and mediastinal lymphadenopathy. The patient is currently receiving a course of concurrent chemoradiation with weekly carboplatin for AUC of 2 and paclitaxel 45 mg/M2.    Status post 1 cycle which she tolerated fairly well with no concerning adverse effects.  Labs from today have been reviewed.  Recommend for her to proceed with cycle #2 today as scheduled.  She will follow-up next week for labs and chemotherapy and will have a follow-up visit in 2 weeks for evaluation prior to cycle #4.  She was advised to call immediately if she has any concerning symptoms in the interval. The patient voices understanding of current disease status and treatment options and is in agreement with the current care plan.  All questions were answered. The patient knows to call the clinic with any problems, questions or concerns. We can certainly see the patient much sooner if necessary.

## 2018-07-21 NOTE — Progress Notes (Signed)
[facility] OFFICE PROGRESS NOTE  [name], MD [facility] [address] [place] [address]  DIAGNOSIS: stage IIIB(T4, N2, M0) non-small cell lung cancer, poorly differentiated squamous cell carcinoma. She presented with large right upper lobe lung mass in addition to right hilar and mediastinal lymphadenopathy diagnosed in January 2020.  PRIOR THERAPY: None  CURRENT THERAPY: Concurrent chemoradiation with weekly carboplatin for AUC of 2 and paclitaxel 45 mg/M2.  First dose July 13, 2018.  Status post 1 cycle.  INTERVAL HISTORY: NYIAH PIANKA 73 y.o. female returns for routine follow-up visit accompanied by her husband.  The patient is feeling fine today and has no concerning complaints.  She denies fevers and chills.  Denies chest pain, shortness of breath, cough, hemoptysis.  Denies nausea, vomiting, constipation, diarrhea.  Denies recent weight loss or night sweats.  She tolerated her first cycle of chemotherapy well overall.  The patient is here for evaluation prior to cycle #2.  MEDICAL HISTORY: Past Medical History:  Diagnosis Date  . Allergy   . Cancer ([place])    lung cancer  . GERD (gastroesophageal reflux disease)   . Hyperlipidemia   . Hypertension   . Reflux   . Tubulovillous adenoma of colon 2003    ALLERGIES:  is allergic to coconut oil; codeine; shrimp [shellfish allergy]; and trandolapril.  MEDICATIONS:  Current Outpatient Medications  Medication Sig Dispense Refill  . albuterol (PROAIR HFA) 108 (90 Base) MCG/ACT inhaler INHALE 2 PUFFS INTO THE LUNGS EVERY 6 HOURS AS NEEDED FOR WHEEZING OR SHORTNESS OF BREATH (Patient taking differently: Inhale 2 puffs into the lungs every 6 (six) hours as needed for wheezing or shortness of breath. ) 8.5 g 0  . amLODipine (NORVASC) 10 MG tablet TAKE 1 TABLET(10 MG) BY MOUTH DAILY (Patient taking differently: Take 10 mg by mouth daily. ) 90 tablet 1  . aspirin EC 81 MG tablet Take 81 mg  by mouth daily.    Marland Kitchen atorvastatin (LIPITOR) 40 MG tablet Take 1 tablet 40 mg by mouth daily (Patient taking differently: Take 40 mg by mouth every other day. At night) 90 tablet 0  . omeprazole (PRILOSEC) 40 MG capsule Take 1 capsule (40 mg total) by mouth daily. 90 capsule 0  . prochlorperazine (COMPAZINE) 10 MG tablet Take 1 tablet (10 mg total) by mouth every 6 (six) hours as needed for nausea or vomiting. 30 tablet 0  . sucralfate (CARAFATE) 1 g tablet TAKE 1 TABLET BY MOUTH 1 HOUR BEFORE MEALS AND AT BEDTIME AS NEEDED (Patient taking differently: Take 1 g by mouth 4 (four) times daily as needed (stomach acid). ) 120 tablet prn   No current facility-administered medications for this visit.    Facility-Administered Medications Ordered in Other Visits  Medication Dose Route Frequency Provider Last Rate Last Dose  . CARBOplatin (PARAPLATIN) 180 mg in sodium chloride 0.9 % 250 mL chemo infusion  180 mg Intravenous Once Curt Bears, MD      . dexamethasone (DECADRON) 20 mg in sodium chloride 0.9 % 50 mL IVPB  20 mg Intravenous Once Curt Bears, MD 208 mL/hr at 07/21/18 1518 20 mg at 07/21/18 1518  . PACLitaxel (TAXOL) 84 mg in sodium chloride 0.9 % 250 mL chemo infusion (</= 80mg /m2)  45 mg/m2 (Treatment Plan Recorded) Intravenous Once Curt Bears, MD        SURGICAL HISTORY:  Past Surgical History:  Procedure Laterality Date  . CESAREAN SECTION     1  time  . COLONOSCOPY    . VIDEO BRONCHOSCOPY WITH ENDOBRONCHIAL NAVIGATION N/A 06/24/2018   Procedure: VIDEO BRONCHOSCOPY WITH ENDOBRONCHIAL NAVIGATION with biopsies, right upper lung lobe;  Surgeon: Melrose Nakayama, MD;  Location: [facility] OR;  Service: Thoracic;  Laterality: N/A;  . VIDEO BRONCHOSCOPY WITH ENDOBRONCHIAL ULTRASOUND N/A 06/24/2018   Procedure: VIDEO BRONCHOSCOPY WITH ENDOBRONCHIAL ULTRASOUND, right upper lung lobe;  Surgeon: Melrose Nakayama, MD;  Location: [facility] OR;  Service: Thoracic;  Laterality: N/A;    REVIEW  OF SYSTEMS:   Review of Systems  Constitutional: Negative for appetite change, chills, fatigue, fever and unexpected weight change.  HENT:   Negative for mouth sores, nosebleeds, sore throat and trouble swallowing.   Eyes: Negative for eye problems and icterus.  Respiratory: Negative for cough, hemoptysis, shortness of breath and wheezing.   Cardiovascular: Negative for chest pain and leg swelling.  Gastrointestinal: Negative for abdominal pain, constipation, diarrhea, nausea and vomiting.  Genitourinary: Negative for bladder incontinence, difficulty urinating, dysuria, frequency and hematuria.   Musculoskeletal: Negative for back pain, gait problem, neck pain and neck stiffness.  Skin: Negative for itching and rash.  Neurological: Negative for dizziness, extremity weakness, gait problem, headaches, light-headedness and seizures.  Hematological: Negative for adenopathy. Does not bruise/bleed easily.  Psychiatric/Behavioral: Negative for confusion, depression and sleep disturbance. The patient is not nervous/anxious.     PHYSICAL EXAMINATION:  Blood pressure (!) 151/91, pulse 70, temperature 97.8 F (36.6 C), temperature source Oral, resp. rate 18, height 5\' 7"  (1.702 m), weight 177 lb 6.4 oz (80.5 kg), SpO2 97 %.  ECOG PERFORMANCE STATUS: 1 - Symptomatic but completely ambulatory  Physical Exam  Constitutional: Oriented to person, place, and time and well-developed, well-nourished, and in no distress. No distress.  HENT:  Head: Normocephalic and atraumatic.  Mouth/Throat: Oropharynx is clear and moist. No oropharyngeal exudate.  Eyes: Conjunctivae are normal. Right eye exhibits no discharge. Left eye exhibits no discharge. No scleral icterus.  Neck: Normal range of motion. Neck supple.  Cardiovascular: Normal rate, regular rhythm, normal heart sounds and intact distal pulses.   Pulmonary/Chest: Effort normal and breath sounds normal. No respiratory distress. No wheezes. No rales.   Abdominal: Soft. Bowel sounds are normal. Exhibits no distension and no mass. There is no tenderness.  Musculoskeletal: Normal range of motion. Exhibits no edema.  Lymphadenopathy:    No cervical adenopathy.  Neurological: Alert and oriented to person, place, and time. Exhibits normal muscle tone. Gait normal. Coordination normal.  Skin: Skin is warm and dry. No rash noted. Not diaphoretic. No erythema. No pallor.  Psychiatric: Mood, memory and judgment normal.  Vitals reviewed.  LABORATORY DATA: Lab Results  Component Value Date   WBC 7.0 07/21/2018   HGB 11.5 (L) 07/21/2018   HCT 36.2 07/21/2018   MCV 88.1 07/21/2018   PLT 401 (H) 07/21/2018      Chemistry      Component Value Date/Time   NA 141 07/21/2018 1335   NA 144 04/02/2017 1435   K 3.8 07/21/2018 1335   CL 106 07/21/2018 1335   CO2 28 07/21/2018 1335   BUN 12 07/21/2018 1335   BUN 11 04/02/2017 1435   CREATININE 0.78 07/21/2018 1335   CREATININE 0.87 03/22/2016 0908      Component Value Date/Time   CALCIUM 9.0 07/21/2018 1335   ALKPHOS 94 07/21/2018 1335   AST 14 (L) 07/21/2018 1335   ALT 10 07/21/2018 1335   BILITOT 0.3 07/21/2018 1335  RADIOGRAPHIC STUDIES:  Dg Chest 2 View  Result Date: 06/24/2018 CLINICAL DATA:  Right lung mass.  Preoperative biopsy EXAM: CHEST - 2 VIEW COMPARISON:  Chest radiograph May 27, 2018 and chest CT June 02, 2018 FINDINGS: The mass in the anterior segment right upper lobe with associated right hilar adenopathy is again noted. The mass in the anterior segment of the right upper lobe measures 7.5 x 6.6 x 6.5 cm. Lungs elsewhere are clear. Heart size and pulmonary vascularity normal. No adenopathy. There is aortic atherosclerosis. There is upper thoracic levoscoliosis. IMPRESSION: Persistent large anterior segment right upper lobe mass with right hilar adenopathy. No pneumothorax. Lungs elsewhere clear. Heart size normal. There is aortic atherosclerosis. Aortic  Atherosclerosis (ICD10-I70.0). Electronically Signed   By: Lowella Grip III M.D.   On: 06/24/2018 11:22   Mr Jeri Cos XB Contrast  Result Date: 07/02/2018 CLINICAL DATA:  73 year old female recently diagnosed with right lung cancer. Staging. EXAM: MRI HEAD WITHOUT AND WITH CONTRAST TECHNIQUE: Multiplanar, multiecho pulse sequences of the brain and surrounding structures were obtained without and with intravenous contrast. CONTRAST:  8 milliliters Gadavist COMPARISON:  PET-CT 07/02/2018. FINDINGS: Brain: No abnormal enhancement identified. No midline shift, mass effect, or evidence of intracranial mass lesion. No dural thickening. No restricted diffusion to suggest acute infarction. No ventriculomegaly, extra-axial collection or acute intracranial hemorrhage. Cervicomedullary junction and pituitary are within normal limits. Patchy and scattered bilateral cerebral white matter T2 and FLAIR hyperintensity in a nonspecific configuration. Subcortical involvement predominates. No cortical encephalomalacia or chronic cerebral blood products. Mild perivascular spaces in the deep gray matter. Mild patchy T2 and FLAIR hyperintensity in the pons. Vascular: Major intracranial vascular flow voids are preserved. The major dural venous sinuses are enhancing and appear to be patent. Skull and upper cervical spine: Negative visible cervical spine and spinal cord. Sinuses/Orbits: Normal orbits. Trace paranasal sinus mucosal thickening. Other: Visible internal auditory structures appear normal. Scalp and face soft tissues appear negative. IMPRESSION: 1.  No metastatic disease or acute intracranial abnormality. 2. Moderate nonspecific signal changes in the brain most commonly due to chronic small vessel disease. Electronically Signed   By: Genevie Ann M.D.   On: 07/02/2018 21:30   Nm Pet Image Initial (pi) Skull Base To Thigh  Result Date: 07/02/2018 CLINICAL DATA:  Initial treatment strategy for staging of right upper lobe  lung mass. EXAM: NUCLEAR MEDICINE PET SKULL BASE TO THIGH TECHNIQUE: 10.1 mCi F-18 FDG was injected intravenously. Full-ring PET imaging was performed from the skull base to thigh after the radiotracer. CT data was obtained and used for attenuation correction and anatomic localization. Fasting blood glucose: 93 mg/dl COMPARISON:  Chest CT of 06/02/2018 FINDINGS: Mediastinal blood pool activity: SUV max 2.5 NECK: No areas of abnormal hypermetabolism. Incidental CT findings: Bilateral carotid atherosclerosis. No cervical adenopathy. CHEST: Hypermetabolism corresponding to the anterior right upper lobe lung mass. Measures on the order of 9.9 x 5.6 cm and a S.U.V. max of 24.7 including on image 34/8. This is felt to have enlarged compared to 06/02/2018 where it measured 8.1 x 5.0 cm (when remeasured). No gross chest wall invasion or osseous destruction. Mediastinal nodal metastasis, including a high right paratracheal node of 8 mm and a S.U.V. max of 5.9 on image 52/4. A low right paratracheal/precarinal node measures 1.7 cm and a S.U.V. max of 8.6 on image 70/4. Hypermetabolic right hilar adenopathy at 3.5 x 2.5 cm and a S.U.V. max of 11.3. Incidental CT findings: Aortic and coronary artery atherosclerosis. Left-sided  thyroid mass, without significant hypermetabolism, as on prior CT. Mild centrilobular emphysema. ABDOMEN/PELVIS: No abdominopelvic parenchymal or nodal hypermetabolism. Incidental CT findings: Mild bilateral adrenal thickening. Abdominal aortic atherosclerosis. Right common iliac artery ectasia at 1.9 cm. Globular anterior uterus is likely related to a small underlying fibroid. Moderate pelvic floor laxity. SKELETON: No abnormal marrow activity. Incidental CT findings: none IMPRESSION: 1. Right upper lobe primary bronchogenic carcinoma with ipsilateral mediastinal and hilar nodal metastasis. Presuming non-small-cell histology, T4N2M0 or stage IIIB. 2. Aortic atherosclerosis (ICD10-I70.0), coronary artery  atherosclerosis and emphysema (ICD10-J43.9). 3. Incidental findings, including right common iliac artery ectasia, probable uterine fibroid, left thyroid lesion, and pelvic floor laxity. Electronically Signed   By: Abigail Miyamoto M.D.   On: 07/02/2018 15:07   Dg C-arm Bronchoscopy  Result Date: 06/24/2018 C-ARM BRONCHOSCOPY: Fluoroscopy was utilized by the requesting physician.  No radiographic interpretation.     ASSESSMENT/PLAN:  Stage III squamous cell carcinoma of right lung ([place]) This is a very pleasant 73 year old African-American female recently diagnosed with a stage IIIB (T4, N2, M0) non-small cell lung cancer, poorly differentiated squamous cell carcinoma presented with right upper lobe lung mass in addition to right hilar and mediastinal lymphadenopathy. The patient is currently receiving a course of concurrent chemoradiation with weekly carboplatin for AUC of 2 and paclitaxel 45 mg/M2.    Status post 1 cycle which she tolerated fairly well with no concerning adverse effects.  Labs from today have been reviewed.  Recommend for her to proceed with cycle #2 today as scheduled.  She will follow-up next week for labs and chemotherapy and will have a follow-up visit in 2 weeks for evaluation prior to cycle #4.  She was advised to call immediately if she has any concerning symptoms in the interval. The patient voices understanding of current disease status and treatment options and is in agreement with the current care plan.  All questions were answered. The patient knows to call the clinic with any problems, questions or concerns. We can certainly see the patient much sooner if necessary.   No orders of the defined types were placed in this encounter.    Mikey Bussing, DNP, AGPCNP-BC, AOCNP 07/21/18

## 2018-07-21 NOTE — Patient Instructions (Signed)
[facility] Discharge Instructions for Patients Receiving Chemotherapy  Today you received the following chemotherapy agents :  Taxol, Carboplatin.  To help prevent nausea and vomiting after your treatment, we encourage you to take your nausea medication as prescribed.  TAKE COMPAZINE 10 MG EVERY 6 HOURS AS NEEDED FOR NAUSEA.  DO NOT DRIVE AFTER TAKING THIS MEDICATION AS IT CAN CAUSE DROWSINESS. DO NOT EAT GREASY NOR SPICY FOODS.  DO DRINK LOTS OF FLUIDS AS TOLERATED.   If you develop nausea and vomiting that is not controlled by your nausea medication, call the clinic.   BELOW ARE SYMPTOMS THAT SHOULD BE REPORTED IMMEDIATELY:  *FEVER GREATER THAN 100.5 F  *CHILLS WITH OR WITHOUT FEVER  NAUSEA AND VOMITING THAT IS NOT CONTROLLED WITH YOUR NAUSEA MEDICATION  *UNUSUAL SHORTNESS OF BREATH  *UNUSUAL BRUISING OR BLEEDING  TENDERNESS IN MOUTH AND THROAT WITH OR WITHOUT PRESENCE OF ULCERS  *URINARY PROBLEMS  *BOWEL PROBLEMS  UNUSUAL RASH Items with * indicate a potential emergency and should be followed up as soon as possible.  Feel free to call the clinic should you have any questions or concerns. The clinic phone number is [phone].  Please show the [facility] at check-in to the Emergency Department and triage nurse.   Paclitaxel injection What is this medicine? PACLITAXEL (PAK li TAX el) is a chemotherapy drug. It targets fast dividing cells, like cancer cells, and causes these cells to die. This medicine is used to treat ovarian cancer, breast cancer, lung cancer, Kaposi's sarcoma, and other cancers. This medicine may be used for other purposes; ask your health care provider or pharmacist if you have questions. COMMON BRAND NAME(S): Onxol, Taxol What should I tell my health care provider before I take this medicine? They need to know if you have any of these conditions: -history of irregular heartbeat -liver disease -low blood counts, like low  white cell, platelet, or red cell counts -lung or breathing disease, like asthma -tingling of the fingers or toes, or other nerve disorder -an unusual or allergic reaction to paclitaxel, alcohol, polyoxyethylated castor oil, other chemotherapy, other medicines, foods, dyes, or preservatives -pregnant or trying to get pregnant -breast-feeding How should I use this medicine? This drug is given as an infusion into a vein. It is administered in a hospital or clinic by a specially trained health care professional. Talk to your pediatrician regarding the use of this medicine in children. Special care may be needed. Overdosage: If you think you have taken too much of this medicine contact a poison control center or emergency room at once. NOTE: This medicine is only for you. Do not share this medicine with others. What if I miss a dose? It is important not to miss your dose. Call your doctor or health care professional if you are unable to keep an appointment. What may interact with this medicine? Do not take this medicine with any of the following medications: -disulfiram -metronidazole This medicine may also interact with the following medications: -antiviral medicines for hepatitis, HIV or AIDS -certain antibiotics like erythromycin and clarithromycin -certain medicines for fungal infections like ketoconazole and itraconazole -certain medicines for seizures like carbamazepine, phenobarbital, phenytoin -gemfibrozil -nefazodone -rifampin -St. John's wort This list may not describe all possible interactions. Give your health care provider a list of all the medicines, herbs, non-prescription drugs, or dietary supplements you use. Also tell them if you smoke, drink alcohol, or use illegal drugs. Some items may interact with your medicine. What  should I watch for while using this medicine? Your condition will be monitored carefully while you are receiving this medicine. You will need important  blood work done while you are taking this medicine. This medicine can cause serious allergic reactions. To reduce your risk you will need to take other medicine(s) before treatment with this medicine. If you experience allergic reactions like skin rash, itching or hives, swelling of the face, lips, or tongue, tell your doctor or health care professional right away. In some cases, you may be given additional medicines to help with side effects. Follow all directions for their use. This drug may make you feel generally unwell. This is not uncommon, as chemotherapy can affect healthy cells as well as cancer cells. Report any side effects. Continue your course of treatment even though you feel ill unless your doctor tells you to stop. Call your doctor or health care professional for advice if you get a fever, chills or sore throat, or other symptoms of a cold or flu. Do not treat yourself. This drug decreases your body's ability to fight infections. Try to avoid being around people who are sick. This medicine may increase your risk to bruise or bleed. Call your doctor or health care professional if you notice any unusual bleeding. Be careful brushing and flossing your teeth or using a toothpick because you may get an infection or bleed more easily. If you have any dental work done, tell your dentist you are receiving this medicine. Avoid taking products that contain aspirin, acetaminophen, ibuprofen, naproxen, or ketoprofen unless instructed by your doctor. These medicines may hide a fever. Do not become pregnant while taking this medicine. Women should inform their doctor if they wish to become pregnant or think they might be pregnant. There is a potential for serious side effects to an unborn child. Talk to your health care professional or pharmacist for more information. Do not breast-feed an infant while taking this medicine. Men are advised not to father a child while receiving this medicine. This product  may contain alcohol. Ask your pharmacist or healthcare provider if this medicine contains alcohol. Be sure to tell all healthcare providers you are taking this medicine. Certain medicines, like metronidazole and disulfiram, can cause an unpleasant reaction when taken with alcohol. The reaction includes flushing, headache, nausea, vomiting, sweating, and increased thirst. The reaction can last from 30 minutes to several hours. What side effects may I notice from receiving this medicine? Side effects that you should report to your doctor or health care professional as soon as possible: -allergic reactions like skin rash, itching or hives, swelling of the face, lips, or tongue -breathing problems -changes in vision -fast, irregular heartbeat -high or low blood pressure -mouth sores -pain, tingling, numbness in the hands or feet -signs of decreased platelets or bleeding - bruising, pinpoint red spots on the skin, black, tarry stools, blood in the urine -signs of decreased red blood cells - unusually weak or tired, feeling faint or lightheaded, falls -signs of infection - fever or chills, cough, sore throat, pain or difficulty passing urine -signs and symptoms of liver injury like dark yellow or brown urine; general ill feeling or flu-like symptoms; light-colored stools; loss of appetite; nausea; right upper belly pain; unusually weak or tired; yellowing of the eyes or skin -swelling of the ankles, feet, hands -unusually slow heartbeat Side effects that usually do not require medical attention (report to your doctor or health care professional if they continue or are bothersome): -diarrhea -hair  loss -loss of appetite -muscle or joint pain -nausea, vomiting -pain, redness, or irritation at site where injected -tiredness This list may not describe all possible side effects. Call your doctor for medical advice about side effects. You may report side effects to FDA at 1-800-FDA-1088. Where should I  keep my medicine? This drug is given in a hospital or clinic and will not be stored at home. NOTE: This sheet is a summary. It may not cover all possible information. If you have questions about this medicine, talk to your doctor, pharmacist, or health care provider.  2019 Elsevier/Gold Standard (2017-02-04 13:14:55)

## 2018-07-21 NOTE — Research (Signed)
Human Biospecimens for the Discovery and Validation of Biomarkers for the Prediction, Diagnosis and Management of Disease  ADX01 - [id number] PI: Bobbye Riggs, MD Attending:  Visit: Consent  X Study was introduced by the attending physician.  X Patient had the opportunity to ask questions.  X Consenting personnel has reviewed consent in entirety with patient.  Discussion included, but not limited to: protocol expectations/evaluations, side effects, risks and benefits of the procedures associated with the study.  X Patient verbalizes good understanding of all instructions and information in consent, including the voluntary nature of participation, and agrees to comply with all protocol requirements.  X The patient wishes to participate in this clinical trial and willingly signed the consent document.  X Copy of signed consent was given to the patient.  The original was scanned to medial records and the original will be placed in the subject's research chart.  X No study-specific procedures were done prior to consent.  Any study specific tests/procedures will now be scheduled and completed per protocol for screening purposes.  X  The patient is aware that their insurance will not be billed as this is not a pharmaceutical trial and we are usually just collecting labs at an already scheduled lab visit.    X Patient signed the ICF (IRB approved 03/28/2017), HIPPA (IRB approved 01/03/2017) forms on 07/21/2018 at 2:20 pm  X The patient was provided with the contact information for the PI and the IRB if they have any research related questions  X The PI was notified that the patient consented to the study   Patient will provide blood on 07/28/2018 to complete study enrollment.  A VISA debit card will be provided as specified by the protocol. [facility] 07/21/18

## 2018-07-22 ENCOUNTER — Other Ambulatory Visit: Payer: Self-pay | Admitting: *Deleted

## 2018-07-22 ENCOUNTER — Ambulatory Visit
Admission: RE | Admit: 2018-07-22 | Discharge: 2018-07-22 | Disposition: A | Discharge status: Home / Self Care | Payer: BLUE CROSS/BLUE SHIELD | Source: Ambulatory Visit | Attending: Radiation Oncology | Admitting: Radiation Oncology

## 2018-07-22 ENCOUNTER — Telehealth: Payer: Self-pay | Admitting: Oncology

## 2018-07-22 DIAGNOSIS — Z006 Encounter for examination for normal comparison and control in clinical research program: Secondary | ICD-10-CM

## 2018-07-22 DIAGNOSIS — C3411 Malignant neoplasm of upper lobe, right bronchus or lung: Secondary | ICD-10-CM | POA: Diagnosis not present

## 2018-07-22 DIAGNOSIS — Z510 Encounter for antineoplastic radiation therapy: Secondary | ICD-10-CM | POA: Diagnosis not present

## 2018-07-22 NOTE — Telephone Encounter (Signed)
Scheduled appt per 2/4 los - pt to get an updated schedule next visit.

## 2018-07-23 ENCOUNTER — Ambulatory Visit
Admission: RE | Admit: 2018-07-23 | Discharge: 2018-07-23 | Disposition: A | Discharge status: Home / Self Care | Payer: BLUE CROSS/BLUE SHIELD | Source: Ambulatory Visit | Attending: Radiation Oncology | Admitting: Radiation Oncology

## 2018-07-23 DIAGNOSIS — Z510 Encounter for antineoplastic radiation therapy: Secondary | ICD-10-CM | POA: Diagnosis not present

## 2018-07-23 DIAGNOSIS — C3411 Malignant neoplasm of upper lobe, right bronchus or lung: Secondary | ICD-10-CM | POA: Diagnosis not present

## 2018-07-24 ENCOUNTER — Ambulatory Visit
Admission: RE | Admit: 2018-07-24 | Discharge: 2018-07-24 | Disposition: A | Discharge status: Home / Self Care | Payer: BLUE CROSS/BLUE SHIELD | Source: Ambulatory Visit | Attending: Radiation Oncology | Admitting: Radiation Oncology

## 2018-07-24 DIAGNOSIS — C3411 Malignant neoplasm of upper lobe, right bronchus or lung: Secondary | ICD-10-CM | POA: Diagnosis not present

## 2018-07-24 DIAGNOSIS — Z510 Encounter for antineoplastic radiation therapy: Secondary | ICD-10-CM | POA: Diagnosis not present

## 2018-07-24 MED ORDER — RADIAPLEXRX EX GEL
Freq: Two times a day (BID) | CUTANEOUS | Status: DC
  Administered 2018-07-24: 17:00:00 via TOPICAL

## 2018-07-27 ENCOUNTER — Ambulatory Visit
Admission: RE | Admit: 2018-07-27 | Discharge: 2018-07-27 | Disposition: A | Discharge status: Home / Self Care | Payer: BLUE CROSS/BLUE SHIELD | Source: Ambulatory Visit | Attending: Radiation Oncology | Admitting: Radiation Oncology

## 2018-07-27 DIAGNOSIS — Z510 Encounter for antineoplastic radiation therapy: Secondary | ICD-10-CM | POA: Diagnosis not present

## 2018-07-27 DIAGNOSIS — C3411 Malignant neoplasm of upper lobe, right bronchus or lung: Secondary | ICD-10-CM | POA: Diagnosis not present

## 2018-07-28 ENCOUNTER — Ambulatory Visit
Admission: RE | Admit: 2018-07-28 | Discharge: 2018-07-28 | Disposition: A | Discharge status: Home / Self Care | Payer: BLUE CROSS/BLUE SHIELD | Source: Ambulatory Visit | Attending: Radiation Oncology | Admitting: Radiation Oncology

## 2018-07-28 ENCOUNTER — Inpatient Hospital Stay: Payer: BLUE CROSS/BLUE SHIELD

## 2018-07-28 ENCOUNTER — Encounter: Payer: Self-pay | Admitting: *Deleted

## 2018-07-28 VITALS — BP 135/86 | HR 72 | Temp 98.3°F | Resp 17

## 2018-07-28 DIAGNOSIS — C3491 Malignant neoplasm of unspecified part of right bronchus or lung: Secondary | ICD-10-CM

## 2018-07-28 DIAGNOSIS — Z510 Encounter for antineoplastic radiation therapy: Secondary | ICD-10-CM | POA: Diagnosis not present

## 2018-07-28 DIAGNOSIS — C3411 Malignant neoplasm of upper lobe, right bronchus or lung: Secondary | ICD-10-CM | POA: Diagnosis not present

## 2018-07-28 DIAGNOSIS — Z006 Encounter for examination for normal comparison and control in clinical research program: Secondary | ICD-10-CM

## 2018-07-28 LAB — CBC WITH DIFFERENTIAL (CANCER CENTER ONLY)
Abs Immature Granulocytes: 0.03 10*3/uL (ref 0.00–0.07)
BASOS PCT: 1 %
Basophils Absolute: 0.1 10*3/uL (ref 0.0–0.1)
Eosinophils Absolute: 0.2 10*3/uL (ref 0.0–0.5)
Eosinophils Relative: 3 %
HCT: 36.3 % (ref 36.0–46.0)
Hemoglobin: 11.7 g/dL — ABNORMAL LOW (ref 12.0–15.0)
Immature Granulocytes: 1 %
Lymphocytes Relative: 30 %
Lymphs Abs: 1.5 10*3/uL (ref 0.7–4.0)
MCH: 27.9 pg (ref 26.0–34.0)
MCHC: 32.2 g/dL (ref 30.0–36.0)
MCV: 86.4 fL (ref 80.0–100.0)
Monocytes Absolute: 0.4 10*3/uL (ref 0.1–1.0)
Monocytes Relative: 9 %
Neutro Abs: 2.7 10*3/uL (ref 1.7–7.7)
Neutrophils Relative %: 56 %
PLATELETS: 358 10*3/uL (ref 150–400)
RBC: 4.2 MIL/uL (ref 3.87–5.11)
RDW: 16.1 % — ABNORMAL HIGH (ref 11.5–15.5)
WBC Count: 4.8 10*3/uL (ref 4.0–10.5)
nRBC: 0 % (ref 0.0–0.2)

## 2018-07-28 LAB — CMP (CANCER CENTER ONLY)
ALT: 11 U/L (ref 0–44)
ANION GAP: 7 (ref 5–15)
AST: 13 U/L — ABNORMAL LOW (ref 15–41)
Albumin: 3.2 g/dL — ABNORMAL LOW (ref 3.5–5.0)
Alkaline Phosphatase: 96 U/L (ref 38–126)
BUN: 11 mg/dL (ref 8–23)
CO2: 29 mmol/L (ref 22–32)
Calcium: 9.5 mg/dL (ref 8.9–10.3)
Chloride: 105 mmol/L (ref 98–111)
Creatinine: 0.84 mg/dL (ref 0.44–1.00)
GFR, Est AFR Am: >60 mL/min (ref >60)
GFR, Estimated: >60 mL/min (ref >60)
Glucose, Bld: 79 mg/dL (ref 70–99)
POTASSIUM: 3.8 mmol/L (ref 3.5–5.1)
Sodium: 141 mmol/L (ref 135–145)
Total Bilirubin: 0.6 mg/dL (ref 0.3–1.2)
Total Protein: 6.7 g/dL (ref 6.5–8.1)

## 2018-07-28 MED ORDER — PALONOSETRON HCL INJECTION 0.25 MG/5ML
0.2500 mg | Freq: Once | INTRAVENOUS | Status: AC
  Administered 2018-07-28: 0.25 mg via INTRAVENOUS

## 2018-07-28 MED ORDER — FAMOTIDINE IN NACL 20-0.9 MG/50ML-% IV SOLN
INTRAVENOUS | Status: AC
  Filled 2018-07-28: qty 50

## 2018-07-28 MED ORDER — SODIUM CHLORIDE 0.9 % IV SOLN
20.0000 mg | Freq: Once | INTRAVENOUS | Status: AC
  Administered 2018-07-28: 20 mg via INTRAVENOUS
  Filled 2018-07-28: qty 20

## 2018-07-28 MED ORDER — FAMOTIDINE IN NACL 20-0.9 MG/50ML-% IV SOLN
20.0000 mg | Freq: Once | INTRAVENOUS | Status: AC
  Administered 2018-07-28: 20 mg via INTRAVENOUS

## 2018-07-28 MED ORDER — PALONOSETRON HCL INJECTION 0.25 MG/5ML
INTRAVENOUS | Status: AC
  Filled 2018-07-28: qty 5

## 2018-07-28 MED ORDER — SODIUM CHLORIDE 0.9 % IV SOLN
45.0000 mg/m2 | Freq: Once | INTRAVENOUS | Status: AC
  Administered 2018-07-28: 84 mg via INTRAVENOUS
  Filled 2018-07-28: qty 14

## 2018-07-28 MED ORDER — DIPHENHYDRAMINE HCL 50 MG/ML IJ SOLN
INTRAMUSCULAR | Status: AC
  Filled 2018-07-28: qty 1

## 2018-07-28 MED ORDER — SODIUM CHLORIDE 0.9 % IV SOLN
179.2000 mg | Freq: Once | INTRAVENOUS | Status: AC
  Administered 2018-07-28: 180 mg via INTRAVENOUS
  Filled 2018-07-28: qty 18

## 2018-07-28 MED ORDER — SODIUM CHLORIDE 0.9 % IV SOLN
Freq: Once | INTRAVENOUS | Status: AC
  Administered 2018-07-28: 15:00:00 via INTRAVENOUS
  Filled 2018-07-28: qty 250

## 2018-07-28 MED ORDER — DIPHENHYDRAMINE HCL 50 MG/ML IJ SOLN
50.0000 mg | Freq: Once | INTRAMUSCULAR | Status: AC
  Administered 2018-07-28: 50 mg via INTRAVENOUS

## 2018-07-28 NOTE — Progress Notes (Signed)
Patient in clinic today,labs drawn per [id number] protocol. Patient was given a $50 debit card for participating in the research study. Patient was thanked for her time and contribution  to the study.

## 2018-07-28 NOTE — Patient Instructions (Signed)
[facility] Discharge Instructions for Patients Receiving Chemotherapy  Today you received the following chemotherapy agents :  Taxol, Carboplatin.  To help prevent nausea and vomiting after your treatment, we encourage you to take your nausea medication as prescribed.  TAKE COMPAZINE 10 MG EVERY 6 HOURS AS NEEDED FOR NAUSEA.  DO NOT DRIVE AFTER TAKING THIS MEDICATION AS IT CAN CAUSE DROWSINESS. DO NOT EAT GREASY NOR SPICY FOODS.  DO DRINK LOTS OF FLUIDS AS TOLERATED.   If you develop nausea and vomiting that is not controlled by your nausea medication, call the clinic.   BELOW ARE SYMPTOMS THAT SHOULD BE REPORTED IMMEDIATELY:  *FEVER GREATER THAN 100.5 F  *CHILLS WITH OR WITHOUT FEVER  NAUSEA AND VOMITING THAT IS NOT CONTROLLED WITH YOUR NAUSEA MEDICATION  *UNUSUAL SHORTNESS OF BREATH  *UNUSUAL BRUISING OR BLEEDING  TENDERNESS IN MOUTH AND THROAT WITH OR WITHOUT PRESENCE OF ULCERS  *URINARY PROBLEMS  *BOWEL PROBLEMS  UNUSUAL RASH Items with * indicate a potential emergency and should be followed up as soon as possible.  Feel free to call the clinic should you have any questions or concerns. The clinic phone number is [phone].  Please show the [facility] at check-in to the Emergency Department and triage nurse.

## 2018-07-29 ENCOUNTER — Ambulatory Visit
Admission: RE | Admit: 2018-07-29 | Discharge: 2018-07-29 | Disposition: A | Discharge status: Home / Self Care | Payer: BLUE CROSS/BLUE SHIELD | Source: Ambulatory Visit | Attending: Radiation Oncology | Admitting: Radiation Oncology

## 2018-07-29 DIAGNOSIS — Z510 Encounter for antineoplastic radiation therapy: Secondary | ICD-10-CM | POA: Diagnosis not present

## 2018-07-29 DIAGNOSIS — C3411 Malignant neoplasm of upper lobe, right bronchus or lung: Secondary | ICD-10-CM | POA: Diagnosis not present

## 2018-07-29 LAB — RESEARCH LABS

## 2018-07-30 ENCOUNTER — Ambulatory Visit
Admission: RE | Admit: 2018-07-30 | Discharge: 2018-07-30 | Disposition: A | Discharge status: Home / Self Care | Payer: BLUE CROSS/BLUE SHIELD | Source: Ambulatory Visit | Attending: Radiation Oncology | Admitting: Radiation Oncology

## 2018-07-30 DIAGNOSIS — C3411 Malignant neoplasm of upper lobe, right bronchus or lung: Secondary | ICD-10-CM | POA: Diagnosis not present

## 2018-07-30 DIAGNOSIS — Z510 Encounter for antineoplastic radiation therapy: Secondary | ICD-10-CM | POA: Diagnosis not present

## 2018-07-31 ENCOUNTER — Ambulatory Visit
Admission: RE | Admit: 2018-07-31 | Discharge: 2018-07-31 | Disposition: A | Discharge status: Home / Self Care | Payer: BLUE CROSS/BLUE SHIELD | Source: Ambulatory Visit | Attending: Radiation Oncology | Admitting: Radiation Oncology

## 2018-07-31 DIAGNOSIS — Z510 Encounter for antineoplastic radiation therapy: Secondary | ICD-10-CM | POA: Diagnosis not present

## 2018-07-31 DIAGNOSIS — C3411 Malignant neoplasm of upper lobe, right bronchus or lung: Secondary | ICD-10-CM | POA: Diagnosis not present

## 2018-08-03 ENCOUNTER — Ambulatory Visit
Admission: RE | Admit: 2018-08-03 | Discharge: 2018-08-03 | Disposition: A | Discharge status: Home / Self Care | Payer: BLUE CROSS/BLUE SHIELD | Source: Ambulatory Visit | Attending: Radiation Oncology | Admitting: Radiation Oncology

## 2018-08-03 ENCOUNTER — Encounter: Payer: Self-pay | Admitting: Physician Assistant

## 2018-08-03 ENCOUNTER — Inpatient Hospital Stay: Payer: BLUE CROSS/BLUE SHIELD

## 2018-08-03 ENCOUNTER — Inpatient Hospital Stay (HOSPITAL_BASED_OUTPATIENT_CLINIC_OR_DEPARTMENT_OTHER): Payer: BLUE CROSS/BLUE SHIELD | Admitting: Physician Assistant

## 2018-08-03 VITALS — BP 133/91 | HR 71 | Temp 98.3°F | Resp 18 | Ht 67.0 in | Wt 175.7 lb

## 2018-08-03 DIAGNOSIS — C3411 Malignant neoplasm of upper lobe, right bronchus or lung: Secondary | ICD-10-CM | POA: Diagnosis not present

## 2018-08-03 DIAGNOSIS — C3491 Malignant neoplasm of unspecified part of right bronchus or lung: Secondary | ICD-10-CM

## 2018-08-03 DIAGNOSIS — Z79899 Other long term (current) drug therapy: Secondary | ICD-10-CM | POA: Diagnosis not present

## 2018-08-03 DIAGNOSIS — C771 Secondary and unspecified malignant neoplasm of intrathoracic lymph nodes: Secondary | ICD-10-CM

## 2018-08-03 DIAGNOSIS — Z5111 Encounter for antineoplastic chemotherapy: Secondary | ICD-10-CM

## 2018-08-03 DIAGNOSIS — Z510 Encounter for antineoplastic radiation therapy: Secondary | ICD-10-CM | POA: Diagnosis not present

## 2018-08-03 LAB — CBC WITH DIFFERENTIAL (CANCER CENTER ONLY)
Abs Immature Granulocytes: 0.02 10*3/uL (ref 0.00–0.07)
Basophils Absolute: 0 10*3/uL (ref 0.0–0.1)
Basophils Relative: 1 %
Eosinophils Absolute: 0.1 10*3/uL (ref 0.0–0.5)
Eosinophils Relative: 4 %
HCT: 36 % (ref 36.0–46.0)
Hemoglobin: 11.5 g/dL — ABNORMAL LOW (ref 12.0–15.0)
IMMATURE GRANULOCYTES: 1 %
Lymphocytes Relative: 23 %
Lymphs Abs: 0.7 10*3/uL (ref 0.7–4.0)
MCH: 28 pg (ref 26.0–34.0)
MCHC: 31.9 g/dL (ref 30.0–36.0)
MCV: 87.6 fL (ref 80.0–100.0)
Monocytes Absolute: 0.3 10*3/uL (ref 0.1–1.0)
Monocytes Relative: 9 %
NEUTROS ABS: 2 10*3/uL (ref 1.7–7.7)
Neutrophils Relative %: 62 %
Platelet Count: 299 10*3/uL (ref 150–400)
RBC: 4.11 MIL/uL (ref 3.87–5.11)
RDW: 16.5 % — ABNORMAL HIGH (ref 11.5–15.5)
WBC Count: 3.2 10*3/uL — ABNORMAL LOW (ref 4.0–10.5)
nRBC: 0 % (ref 0.0–0.2)

## 2018-08-03 LAB — CMP (CANCER CENTER ONLY)
ALK PHOS: 87 U/L (ref 38–126)
ALT: 14 U/L (ref 0–44)
ANION GAP: 8 (ref 5–15)
AST: 15 U/L (ref 15–41)
Albumin: 3.2 g/dL — ABNORMAL LOW (ref 3.5–5.0)
BUN: 11 mg/dL (ref 8–23)
CO2: 28 mmol/L (ref 22–32)
Calcium: 9.2 mg/dL (ref 8.9–10.3)
Chloride: 106 mmol/L (ref 98–111)
Creatinine: 0.78 mg/dL (ref 0.44–1.00)
GFR, Est AFR Am: >60 mL/min (ref >60)
GFR, Estimated: >60 mL/min (ref >60)
Glucose, Bld: 82 mg/dL (ref 70–99)
POTASSIUM: 4 mmol/L (ref 3.5–5.1)
Sodium: 142 mmol/L (ref 135–145)
Total Bilirubin: 0.7 mg/dL (ref 0.3–1.2)
Total Protein: 6.5 g/dL (ref 6.5–8.1)

## 2018-08-03 NOTE — Patient Instructions (Signed)

## 2018-08-03 NOTE — Progress Notes (Signed)
[facility] OFFICE PROGRESS NOTE  [name], MD [address] [facility] [place] [address]  DIAGNOSIS: stage IIIB(T4, N2, M0)non-small celllung cancer,poorly differentiated squamous cell carcinoma. She presented with large right upper lobe lung mass in addition to right hilar and mediastinal lymphadenopathydiagnosed in January 2020.  PRIOR THERAPY: None  CURRENT THERAPY: Concurrent chemoradiation with weekly carboplatin for AUC of 2 and paclitaxel 45 mg/M2. First dose July 13, 2018.  Status post 3 cycles.  INTERVAL HISTORY: Christina [name] 73 y.o. female returns to the clinic today accompanied by her husband.  The patient is feeling well today with no concerning complaints.  She denies any fever, chills, weight loss, or night sweats.  She denies any chest pain, shortness of breath, cough, or hemoptysis.  She denies any nausea, vomiting or diarrhea but endorses mild constipation.  She denies any headaches or visual changes. She tolerated her first 3 cycles of treatment well without any adverse effects.  She is here today for evaluation prior to starting cycle #4 tomorrow.  MEDICAL HISTORY: Past Medical History:  Diagnosis Date  . Allergy   . Cancer ([place])    lung cancer  . GERD (gastroesophageal reflux disease)   . Hyperlipidemia   . Hypertension   . Reflux   . Tubulovillous adenoma of colon 2003    ALLERGIES:  is allergic to coconut oil; codeine; shrimp [shellfish allergy]; and trandolapril.  MEDICATIONS:  Current Outpatient Medications  Medication Sig Dispense Refill  . albuterol (PROAIR HFA) 108 (90 Base) MCG/ACT inhaler INHALE 2 PUFFS INTO THE LUNGS EVERY 6 HOURS AS NEEDED FOR WHEEZING OR SHORTNESS OF BREATH (Patient taking differently: Inhale 2 puffs into the lungs every 6 (six) hours as needed for wheezing or shortness of breath. ) 8.5 g 0  . amLODipine (NORVASC) 10 MG tablet TAKE 1 TABLET(10 MG) BY MOUTH DAILY (Patient taking  differently: Take 10 mg by mouth daily. ) 90 tablet 1  . aspirin EC 81 MG tablet Take 81 mg by mouth daily.    Marland Kitchen atorvastatin (LIPITOR) 40 MG tablet Take 1 tablet 40 mg by mouth daily (Patient taking differently: Take 40 mg by mouth every other day. At night) 90 tablet 0  . omeprazole (PRILOSEC) 40 MG capsule Take 1 capsule (40 mg total) by mouth daily. 90 capsule 0  . prochlorperazine (COMPAZINE) 10 MG tablet Take 1 tablet (10 mg total) by mouth every 6 (six) hours as needed for nausea or vomiting. 30 tablet 0  . sucralfate (CARAFATE) 1 g tablet TAKE 1 TABLET BY MOUTH 1 HOUR BEFORE MEALS AND AT BEDTIME AS NEEDED (Patient taking differently: Take 1 g by mouth 4 (four) times daily as needed (stomach acid). ) 120 tablet prn   No current facility-administered medications for this visit.     SURGICAL HISTORY:  Past Surgical History:  Procedure Laterality Date  . CESAREAN SECTION     1 time  . COLONOSCOPY    . VIDEO BRONCHOSCOPY WITH ENDOBRONCHIAL NAVIGATION N/A 06/24/2018   Procedure: VIDEO BRONCHOSCOPY WITH ENDOBRONCHIAL NAVIGATION with biopsies, right upper lung lobe;  Surgeon: Melrose Nakayama, MD;  Location: [facility] OR;  Service: Thoracic;  Laterality: N/A;  . VIDEO BRONCHOSCOPY WITH ENDOBRONCHIAL ULTRASOUND N/A 06/24/2018   Procedure: VIDEO BRONCHOSCOPY WITH ENDOBRONCHIAL ULTRASOUND, right upper lung lobe;  Surgeon: Melrose Nakayama, MD;  Location: [facility] OR;  Service: Thoracic;  Laterality: N/A;    REVIEW OF SYSTEMS:   Review of Systems  Constitutional: Negative  for appetite change, chills, fatigue, fever and unexpected weight change.  HENT:   Negative for mouth sores, nosebleeds, sore throat and trouble swallowing.   Eyes: Negative for eye problems and icterus.  Respiratory: Negative for cough, hemoptysis, shortness of breath and wheezing.   Cardiovascular: Negative for chest pain and leg swelling.  Gastrointestinal: Positive for mild constipation. Negative for abdominal pain,  constipation, diarrhea, nausea and vomiting.  Genitourinary: Negative for bladder incontinence, difficulty urinating, dysuria, frequency and hematuria.   Musculoskeletal: Negative for back pain, gait problem, neck pain and neck stiffness.  Skin: Negative for itching and rash.  Neurological: Negative for dizziness, extremity weakness, gait problem, headaches, light-headedness and seizures.  Hematological: Negative for adenopathy. Does not bruise/bleed easily.  Psychiatric/Behavioral: Negative for confusion, depression and sleep disturbance. The patient is not nervous/anxious.     PHYSICAL EXAMINATION:  Blood pressure (!) 133/91, pulse 71, temperature 98.3 F (36.8 C), temperature source Oral, resp. rate 18, height 5\' 7"  (1.702 m), weight 175 lb 11.2 oz (79.7 kg), SpO2 97 %.  ECOG PERFORMANCE STATUS: 1 - Symptomatic but completely ambulatory  Physical Exam  Constitutional: Oriented to person, place, and time and well-developed, well-nourished, and in no distress. No distress.  HENT:  Head: Normocephalic and atraumatic.  Mouth/Throat: Oropharynx is clear and moist. No oropharyngeal exudate.  Eyes: Conjunctivae are normal. Right eye exhibits no discharge. Left eye exhibits no discharge. No scleral icterus.  Neck: Normal range of motion. Neck supple.  Cardiovascular: Normal rate, regular rhythm, normal heart sounds and intact distal pulses.   Pulmonary/Chest: Effort normal and breath sounds normal. No respiratory distress. No wheezes. No rales.  Abdominal: Soft. Bowel sounds are normal. Exhibits no distension and no mass. There is no tenderness.  Musculoskeletal: Normal range of motion. Exhibits no edema.  Lymphadenopathy:    No cervical adenopathy.  Neurological: Alert and oriented to person, place, and time. Exhibits normal muscle tone. Gait normal. Coordination normal.  Skin: Skin is warm and dry. No rash noted. Not diaphoretic. No erythema. No pallor.  Psychiatric: Mood, memory and  judgment normal.  Vitals reviewed.  LABORATORY DATA: Lab Results  Component Value Date   WBC 3.2 (L) 08/03/2018   HGB 11.5 (L) 08/03/2018   HCT 36.0 08/03/2018   MCV 87.6 08/03/2018   PLT 299 08/03/2018      Chemistry      Component Value Date/Time   NA 142 08/03/2018 0945   NA 144 04/02/2017 1435   K 4.0 08/03/2018 0945   CL 106 08/03/2018 0945   CO2 28 08/03/2018 0945   BUN 11 08/03/2018 0945   BUN 11 04/02/2017 1435   CREATININE 0.78 08/03/2018 0945   CREATININE 0.87 03/22/2016 0908      Component Value Date/Time   CALCIUM 9.2 08/03/2018 0945   ALKPHOS 87 08/03/2018 0945   AST 15 08/03/2018 0945   ALT 14 08/03/2018 0945   BILITOT 0.7 08/03/2018 0945       RADIOGRAPHIC STUDIES:  No results found.   ASSESSMENT/PLAN:  This is a very pleasant 73 year old African-American female diagnosed with stage IIIb (T4, N2, M0) non-small cell lung cancer shown to be poorly differentiated squamous cell carcinoma. She presented with a upper right lobe lung mass in addition to right hilar and mediastinal lymphadenopathy. Diagnosed January 2020.  Patient is currently receiving a course of concurrent chemoradiation with weekly carboplatin for an AUC of 2 and paclitaxel 45mg /m.  She is status post 3 cycles.  The patient was seen today with Dr.  Mohamed. She has tolerated treatment well with no adverse effects.   I recommend for her to proceed with cycle 4 tomorrow as scheduled.   She notes she has some constipation. She was given a AVS regarding constipation management including to increase fluid intake and to increase her intake of fruits and vegetables.   I will see her back for follow-up in 2 weeks prior to starting cycle # 6.   The patient was advised to call immediately if she has any concerning symptoms in the interval. The patient voices understanding of current disease status and treatment options and is in agreement with the current care plan. All questions were  answered. The patient knows to call the clinic with any problems, questions or concerns. We can certainly see the patient much sooner if necessary   No orders of the defined types were placed in this encounter.    Cassandra L Heilingoetter, PA-C 08/03/18   ADDENDUM: Hematology/Oncology Attending: I had a face-to-face encounter with the patient today.  I recommended her care plan.  This is a very pleasant 73 years old African-American female with a stage IIIb non-small cell lung cancer, squamous cell carcinoma.  She is currently undergoing a course of concurrent chemoradiation with weekly carboplatin and paclitaxel status post 3 cycles.  The patient is tolerating this treatment well with no concerning adverse effects. I recommended for her to proceed with cycle #4 today as scheduled. I will see the patient back for follow-up visit in 2 weeks for evaluation before starting cycle #6. She was advised to call immediately if she has any concerning symptoms in the interval. Disclaimer: This note was dictated with voice recognition software. Similar sounding words can inadvertently be transcribed and may be missed upon review. Eilleen Kempf, MD 08/03/18

## 2018-08-04 ENCOUNTER — Inpatient Hospital Stay: Payer: BLUE CROSS/BLUE SHIELD

## 2018-08-04 ENCOUNTER — Ambulatory Visit
Admission: RE | Admit: 2018-08-04 | Discharge: 2018-08-04 | Disposition: A | Discharge status: Home / Self Care | Payer: BLUE CROSS/BLUE SHIELD | Source: Ambulatory Visit | Attending: Radiation Oncology | Admitting: Radiation Oncology

## 2018-08-04 VITALS — BP 122/77 | HR 87 | Temp 98.2°F | Resp 18

## 2018-08-04 DIAGNOSIS — C3411 Malignant neoplasm of upper lobe, right bronchus or lung: Secondary | ICD-10-CM | POA: Diagnosis not present

## 2018-08-04 DIAGNOSIS — C3491 Malignant neoplasm of unspecified part of right bronchus or lung: Secondary | ICD-10-CM

## 2018-08-04 DIAGNOSIS — Z510 Encounter for antineoplastic radiation therapy: Secondary | ICD-10-CM | POA: Diagnosis not present

## 2018-08-04 MED ORDER — DIPHENHYDRAMINE HCL 50 MG/ML IJ SOLN
50.0000 mg | Freq: Once | INTRAMUSCULAR | Status: AC
  Administered 2018-08-04: 50 mg via INTRAVENOUS

## 2018-08-04 MED ORDER — SODIUM CHLORIDE 0.9 % IV SOLN
179.2000 mg | Freq: Once | INTRAVENOUS | Status: AC
  Administered 2018-08-04: 180 mg via INTRAVENOUS
  Filled 2018-08-04: qty 18

## 2018-08-04 MED ORDER — FAMOTIDINE IN NACL 20-0.9 MG/50ML-% IV SOLN
20.0000 mg | Freq: Once | INTRAVENOUS | Status: AC
  Administered 2018-08-04: 20 mg via INTRAVENOUS

## 2018-08-04 MED ORDER — SODIUM CHLORIDE 0.9 % IV SOLN
Freq: Once | INTRAVENOUS | Status: AC
  Administered 2018-08-04: 10:00:00 via INTRAVENOUS
  Filled 2018-08-04: qty 250

## 2018-08-04 MED ORDER — SODIUM CHLORIDE 0.9 % IV SOLN
45.0000 mg/m2 | Freq: Once | INTRAVENOUS | Status: AC
  Administered 2018-08-04: 84 mg via INTRAVENOUS
  Filled 2018-08-04: qty 14

## 2018-08-04 MED ORDER — FAMOTIDINE IN NACL 20-0.9 MG/50ML-% IV SOLN
INTRAVENOUS | Status: AC
  Filled 2018-08-04: qty 50

## 2018-08-04 MED ORDER — PALONOSETRON HCL INJECTION 0.25 MG/5ML
0.2500 mg | Freq: Once | INTRAVENOUS | Status: AC
  Administered 2018-08-04: 0.25 mg via INTRAVENOUS

## 2018-08-04 MED ORDER — SODIUM CHLORIDE 0.9 % IV SOLN
20.0000 mg | Freq: Once | INTRAVENOUS | Status: AC
  Administered 2018-08-04: 20 mg via INTRAVENOUS
  Filled 2018-08-04: qty 20

## 2018-08-04 MED ORDER — DIPHENHYDRAMINE HCL 50 MG/ML IJ SOLN
INTRAMUSCULAR | Status: AC
  Filled 2018-08-04: qty 1

## 2018-08-04 MED ORDER — PALONOSETRON HCL INJECTION 0.25 MG/5ML
INTRAVENOUS | Status: AC
  Filled 2018-08-04: qty 5

## 2018-08-04 NOTE — Patient Instructions (Signed)
   [facility] Discharge Instructions for Patients Receiving Chemotherapy  Today you received the following chemotherapy agents Taxol and Carboplatin   To help prevent nausea and vomiting after your treatment, we encourage you to take your nausea medication as directed.    If you develop nausea and vomiting that is not controlled by your nausea medication, call the clinic.   BELOW ARE SYMPTOMS THAT SHOULD BE REPORTED IMMEDIATELY:  *FEVER GREATER THAN 100.5 F  *CHILLS WITH OR WITHOUT FEVER  NAUSEA AND VOMITING THAT IS NOT CONTROLLED WITH YOUR NAUSEA MEDICATION  *UNUSUAL SHORTNESS OF BREATH  *UNUSUAL BRUISING OR BLEEDING  TENDERNESS IN MOUTH AND THROAT WITH OR WITHOUT PRESENCE OF ULCERS  *URINARY PROBLEMS  *BOWEL PROBLEMS  UNUSUAL RASH Items with * indicate a potential emergency and should be followed up as soon as possible.  Feel free to call the clinic should you have any questions or concerns. The clinic phone number is [phone].  Please show the CHEMO ALERT CARD at check-in to the Emergency Department and triage nurse.   

## 2018-08-05 ENCOUNTER — Ambulatory Visit
Admission: RE | Admit: 2018-08-05 | Discharge: 2018-08-05 | Disposition: A | Discharge status: Home / Self Care | Payer: BLUE CROSS/BLUE SHIELD | Source: Ambulatory Visit | Attending: Radiation Oncology | Admitting: Radiation Oncology

## 2018-08-05 DIAGNOSIS — Z510 Encounter for antineoplastic radiation therapy: Secondary | ICD-10-CM | POA: Diagnosis not present

## 2018-08-05 DIAGNOSIS — C3411 Malignant neoplasm of upper lobe, right bronchus or lung: Secondary | ICD-10-CM | POA: Diagnosis not present

## 2018-08-06 ENCOUNTER — Ambulatory Visit
Admission: RE | Admit: 2018-08-06 | Discharge: 2018-08-06 | Disposition: A | Discharge status: Home / Self Care | Payer: BLUE CROSS/BLUE SHIELD | Source: Ambulatory Visit | Attending: Radiation Oncology | Admitting: Radiation Oncology

## 2018-08-06 DIAGNOSIS — C3411 Malignant neoplasm of upper lobe, right bronchus or lung: Secondary | ICD-10-CM | POA: Diagnosis not present

## 2018-08-06 DIAGNOSIS — Z510 Encounter for antineoplastic radiation therapy: Secondary | ICD-10-CM | POA: Diagnosis not present

## 2018-08-07 ENCOUNTER — Ambulatory Visit
Admission: RE | Admit: 2018-08-07 | Discharge: 2018-08-07 | Disposition: A | Discharge status: Home / Self Care | Payer: BLUE CROSS/BLUE SHIELD | Source: Ambulatory Visit | Attending: Radiation Oncology | Admitting: Radiation Oncology

## 2018-08-07 DIAGNOSIS — Z510 Encounter for antineoplastic radiation therapy: Secondary | ICD-10-CM | POA: Diagnosis not present

## 2018-08-07 DIAGNOSIS — C3411 Malignant neoplasm of upper lobe, right bronchus or lung: Secondary | ICD-10-CM | POA: Diagnosis not present

## 2018-08-10 ENCOUNTER — Ambulatory Visit
Admission: RE | Admit: 2018-08-10 | Discharge: 2018-08-10 | Disposition: A | Discharge status: Home / Self Care | Payer: BLUE CROSS/BLUE SHIELD | Source: Ambulatory Visit | Attending: Radiation Oncology | Admitting: Radiation Oncology

## 2018-08-10 ENCOUNTER — Inpatient Hospital Stay: Payer: BLUE CROSS/BLUE SHIELD

## 2018-08-10 VITALS — BP 116/84 | HR 81 | Temp 98.4°F | Resp 17

## 2018-08-10 DIAGNOSIS — C3491 Malignant neoplasm of unspecified part of right bronchus or lung: Secondary | ICD-10-CM

## 2018-08-10 DIAGNOSIS — C3411 Malignant neoplasm of upper lobe, right bronchus or lung: Secondary | ICD-10-CM | POA: Diagnosis not present

## 2018-08-10 DIAGNOSIS — Z510 Encounter for antineoplastic radiation therapy: Secondary | ICD-10-CM | POA: Diagnosis not present

## 2018-08-10 LAB — CMP (CANCER CENTER ONLY)
ALT: 13 U/L (ref 0–44)
ANION GAP: 7 (ref 5–15)
AST: 13 U/L — ABNORMAL LOW (ref 15–41)
Albumin: 3.2 g/dL — ABNORMAL LOW (ref 3.5–5.0)
Alkaline Phosphatase: 84 U/L (ref 38–126)
BUN: 11 mg/dL (ref 8–23)
CO2: 26 mmol/L (ref 22–32)
Calcium: 8.7 mg/dL — ABNORMAL LOW (ref 8.9–10.3)
Chloride: 105 mmol/L (ref 98–111)
Creatinine: 0.84 mg/dL (ref 0.44–1.00)
GFR, Est AFR Am: >60 mL/min (ref >60)
GFR, Estimated: >60 mL/min (ref >60)
Glucose, Bld: 103 mg/dL — ABNORMAL HIGH (ref 70–99)
Potassium: 3.7 mmol/L (ref 3.5–5.1)
Sodium: 138 mmol/L (ref 135–145)
Total Bilirubin: 0.6 mg/dL (ref 0.3–1.2)
Total Protein: 6.4 g/dL — ABNORMAL LOW (ref 6.5–8.1)

## 2018-08-10 LAB — CBC WITH DIFFERENTIAL (CANCER CENTER ONLY)
Abs Immature Granulocytes: 0.03 10*3/uL (ref 0.00–0.07)
Basophils Absolute: 0 10*3/uL (ref 0.0–0.1)
Basophils Relative: 1 %
Eosinophils Absolute: 0.1 10*3/uL (ref 0.0–0.5)
Eosinophils Relative: 4 %
HEMATOCRIT: 36.8 % (ref 36.0–46.0)
Hemoglobin: 11.7 g/dL — ABNORMAL LOW (ref 12.0–15.0)
Immature Granulocytes: 1 %
Lymphocytes Relative: 19 %
Lymphs Abs: 0.5 10*3/uL — ABNORMAL LOW (ref 0.7–4.0)
MCH: 28.1 pg (ref 26.0–34.0)
MCHC: 31.8 g/dL (ref 30.0–36.0)
MCV: 88.2 fL (ref 80.0–100.0)
Monocytes Absolute: 0.3 10*3/uL (ref 0.1–1.0)
Monocytes Relative: 10 %
Neutro Abs: 1.7 10*3/uL (ref 1.7–7.7)
Neutrophils Relative %: 65 %
Platelet Count: 257 10*3/uL (ref 150–400)
RBC: 4.17 MIL/uL (ref 3.87–5.11)
RDW: 17.1 % — AB (ref 11.5–15.5)
WBC Count: 2.6 10*3/uL — ABNORMAL LOW (ref 4.0–10.5)
nRBC: 0 % (ref 0.0–0.2)

## 2018-08-10 MED ORDER — SODIUM CHLORIDE 0.9 % IV SOLN
180.0000 mg | Freq: Once | INTRAVENOUS | Status: AC
  Administered 2018-08-10: 180 mg via INTRAVENOUS
  Filled 2018-08-10: qty 18

## 2018-08-10 MED ORDER — SODIUM CHLORIDE 0.9 % IV SOLN
45.0000 mg/m2 | Freq: Once | INTRAVENOUS | Status: AC
  Administered 2018-08-10: 84 mg via INTRAVENOUS
  Filled 2018-08-10: qty 14

## 2018-08-10 MED ORDER — HEPARIN SOD (PORK) LOCK FLUSH 100 UNIT/ML IV SOLN
500.0000 [IU] | Freq: Once | INTRAVENOUS | Status: DC | PRN
  Filled 2018-08-10: qty 5

## 2018-08-10 MED ORDER — SODIUM CHLORIDE 0.9% FLUSH
10.0000 mL | INTRAVENOUS | Status: DC | PRN
  Filled 2018-08-10: qty 10

## 2018-08-10 MED ORDER — DIPHENHYDRAMINE HCL 50 MG/ML IJ SOLN
50.0000 mg | Freq: Once | INTRAMUSCULAR | Status: AC
  Administered 2018-08-10: 50 mg via INTRAVENOUS

## 2018-08-10 MED ORDER — SODIUM CHLORIDE 0.9 % IV SOLN
20.0000 mg | Freq: Once | INTRAVENOUS | Status: AC
  Administered 2018-08-10: 20 mg via INTRAVENOUS
  Filled 2018-08-10: qty 20

## 2018-08-10 MED ORDER — PALONOSETRON HCL INJECTION 0.25 MG/5ML
INTRAVENOUS | Status: AC
  Filled 2018-08-10: qty 5

## 2018-08-10 MED ORDER — PALONOSETRON HCL INJECTION 0.25 MG/5ML
0.2500 mg | Freq: Once | INTRAVENOUS | Status: AC
  Administered 2018-08-10: 0.25 mg via INTRAVENOUS

## 2018-08-10 MED ORDER — DIPHENHYDRAMINE HCL 50 MG/ML IJ SOLN
INTRAMUSCULAR | Status: AC
  Filled 2018-08-10: qty 1

## 2018-08-10 MED ORDER — SODIUM CHLORIDE 0.9 % IV SOLN
20.0000 mg | Freq: Once | INTRAVENOUS | Status: AC
  Administered 2018-08-10: 20 mg via INTRAVENOUS
  Filled 2018-08-10: qty 2

## 2018-08-10 MED ORDER — SODIUM CHLORIDE 0.9 % IV SOLN
Freq: Once | INTRAVENOUS | Status: AC
  Administered 2018-08-10: 10:00:00 via INTRAVENOUS
  Filled 2018-08-10: qty 250

## 2018-08-10 MED ORDER — FAMOTIDINE IN NACL 20-0.9 MG/50ML-% IV SOLN: 20.0000 mg | Freq: Once | INTRAVENOUS | Status: DC

## 2018-08-10 NOTE — Patient Instructions (Signed)
[facility] Discharge Instructions for Patients Receiving Chemotherapy  Today you received the following chemotherapy agents: Taxol, carboplatin  To help prevent nausea and vomiting after your treatment, we encourage you to take your nausea medication as directed.   If you develop nausea and vomiting that is not controlled by your nausea medication, call the clinic.   BELOW ARE SYMPTOMS THAT SHOULD BE REPORTED IMMEDIATELY:  *FEVER GREATER THAN 100.5 F  *CHILLS WITH OR WITHOUT FEVER  NAUSEA AND VOMITING THAT IS NOT CONTROLLED WITH YOUR NAUSEA MEDICATION  *UNUSUAL SHORTNESS OF BREATH  *UNUSUAL BRUISING OR BLEEDING  TENDERNESS IN MOUTH AND THROAT WITH OR WITHOUT PRESENCE OF ULCERS  *URINARY PROBLEMS  *BOWEL PROBLEMS  UNUSUAL RASH Items with * indicate a potential emergency and should be followed up as soon as possible.  Feel free to call the clinic should you have any questions or concerns. The clinic phone number is [phone].  Please show the [place] at check-in to the Emergency Department and triage nurse.

## 2018-08-11 ENCOUNTER — Ambulatory Visit
Admission: RE | Admit: 2018-08-11 | Discharge: 2018-08-11 | Disposition: A | Discharge status: Home / Self Care | Payer: BLUE CROSS/BLUE SHIELD | Source: Ambulatory Visit | Attending: Radiation Oncology | Admitting: Radiation Oncology

## 2018-08-11 DIAGNOSIS — Z510 Encounter for antineoplastic radiation therapy: Secondary | ICD-10-CM | POA: Diagnosis not present

## 2018-08-11 DIAGNOSIS — C3411 Malignant neoplasm of upper lobe, right bronchus or lung: Secondary | ICD-10-CM | POA: Diagnosis not present

## 2018-08-12 ENCOUNTER — Ambulatory Visit
Admission: RE | Admit: 2018-08-12 | Discharge: 2018-08-12 | Disposition: A | Discharge status: Home / Self Care | Payer: BLUE CROSS/BLUE SHIELD | Source: Ambulatory Visit | Attending: Radiation Oncology | Admitting: Radiation Oncology

## 2018-08-12 DIAGNOSIS — C3411 Malignant neoplasm of upper lobe, right bronchus or lung: Secondary | ICD-10-CM | POA: Diagnosis not present

## 2018-08-12 DIAGNOSIS — Z510 Encounter for antineoplastic radiation therapy: Secondary | ICD-10-CM | POA: Diagnosis not present

## 2018-08-13 ENCOUNTER — Other Ambulatory Visit: Payer: Self-pay | Admitting: Physician Assistant

## 2018-08-13 ENCOUNTER — Ambulatory Visit: Payer: BLUE CROSS/BLUE SHIELD | Admitting: Family Medicine

## 2018-08-13 ENCOUNTER — Ambulatory Visit
Admission: RE | Admit: 2018-08-13 | Discharge: 2018-08-13 | Disposition: A | Discharge status: Home / Self Care | Payer: BLUE CROSS/BLUE SHIELD | Source: Ambulatory Visit | Attending: Radiation Oncology | Admitting: Radiation Oncology

## 2018-08-13 DIAGNOSIS — I10 Essential (primary) hypertension: Secondary | ICD-10-CM

## 2018-08-13 DIAGNOSIS — Z510 Encounter for antineoplastic radiation therapy: Secondary | ICD-10-CM | POA: Diagnosis not present

## 2018-08-13 DIAGNOSIS — C3411 Malignant neoplasm of upper lobe, right bronchus or lung: Secondary | ICD-10-CM | POA: Diagnosis not present

## 2018-08-14 ENCOUNTER — Other Ambulatory Visit: Payer: Self-pay | Admitting: Family Medicine

## 2018-08-14 ENCOUNTER — Ambulatory Visit
Admission: RE | Admit: 2018-08-14 | Discharge: 2018-08-14 | Disposition: A | Discharge status: Home / Self Care | Payer: BLUE CROSS/BLUE SHIELD | Source: Ambulatory Visit | Attending: Radiation Oncology | Admitting: Radiation Oncology

## 2018-08-14 DIAGNOSIS — E785 Hyperlipidemia, unspecified: Secondary | ICD-10-CM

## 2018-08-14 DIAGNOSIS — C3411 Malignant neoplasm of upper lobe, right bronchus or lung: Secondary | ICD-10-CM | POA: Diagnosis not present

## 2018-08-14 DIAGNOSIS — Z510 Encounter for antineoplastic radiation therapy: Secondary | ICD-10-CM | POA: Diagnosis not present

## 2018-08-14 NOTE — Telephone Encounter (Signed)
Requested medication (s) are due for refill today: yes  Requested medication (s) are on the active medication list: yes  Last refill:  02/09/18  Future visit scheduled: no  Notes to clinic:  Pt is due for lipid panel- last lipid panel 04/02/17   Requested Prescriptions  Pending Prescriptions Disp Refills   atorvastatin (LIPITOR) 40 MG tablet [Pharmacy Med Name: ATORVASTATIN 40MG  TABLETS] 90 tablet 0    Sig: TAKE 1 TABLET BY MOUTH DAILY     Cardiovascular:  Antilipid - Statins Failed - 08/14/2018  9:45 AM      Failed - Total Cholesterol in normal range and within 360 days    Cholesterol, Total  Date Value Ref Range Status  04/02/2017 154 100 - 199 mg/dL Final         Failed - LDL in normal range and within 360 days    LDL Calculated  Date Value Ref Range Status  04/02/2017 83 0 - 99 mg/dL Final         Failed - HDL in normal range and within 360 days    HDL  Date Value Ref Range Status  04/02/2017 56 >39 mg/dL Final         Failed - Triglycerides in normal range and within 360 days    Triglycerides  Date Value Ref Range Status  04/02/2017 76 0 - 149 mg/dL Final         Passed - Patient is not pregnant      Passed - Valid encounter within last 12 months    Recent Outpatient Visits          2 months ago Acute upper respiratory infection   [facility] at [facility], Fenton Malling, MD   2 months ago Acute URI   Primary Care at [facility], Arlie Solomons, MD   6 months ago Essential hypertension   Primary Care at [facility], Arlie Solomons, MD   10 months ago Essential hypertension   Primary Care at [place], [place], [place]   11 months ago Cough   [facility] at [facility], Damaris Hippo, PA-C

## 2018-08-17 ENCOUNTER — Ambulatory Visit
Admission: RE | Admit: 2018-08-17 | Discharge: 2018-08-17 | Disposition: A | Discharge status: Home / Self Care | Payer: BLUE CROSS/BLUE SHIELD | Source: Ambulatory Visit | Attending: Radiation Oncology | Admitting: Radiation Oncology

## 2018-08-17 DIAGNOSIS — C3411 Malignant neoplasm of upper lobe, right bronchus or lung: Secondary | ICD-10-CM | POA: Diagnosis not present

## 2018-08-17 DIAGNOSIS — Z510 Encounter for antineoplastic radiation therapy: Secondary | ICD-10-CM | POA: Insufficient documentation

## 2018-08-18 ENCOUNTER — Other Ambulatory Visit: Payer: Self-pay

## 2018-08-18 ENCOUNTER — Encounter: Payer: Self-pay | Admitting: Physician Assistant

## 2018-08-18 ENCOUNTER — Ambulatory Visit
Admission: RE | Admit: 2018-08-18 | Discharge: 2018-08-18 | Disposition: A | Discharge status: Home / Self Care | Payer: BLUE CROSS/BLUE SHIELD | Source: Ambulatory Visit | Attending: Radiation Oncology | Admitting: Radiation Oncology

## 2018-08-18 ENCOUNTER — Inpatient Hospital Stay (HOSPITAL_BASED_OUTPATIENT_CLINIC_OR_DEPARTMENT_OTHER): Payer: BLUE CROSS/BLUE SHIELD | Admitting: Physician Assistant

## 2018-08-18 ENCOUNTER — Inpatient Hospital Stay: Payer: BLUE CROSS/BLUE SHIELD

## 2018-08-18 VITALS — BP 105/73 | HR 85 | Temp 98.5°F | Resp 18 | Ht 67.0 in | Wt 171.1 lb

## 2018-08-18 DIAGNOSIS — I10 Essential (primary) hypertension: Secondary | ICD-10-CM

## 2018-08-18 DIAGNOSIS — C3491 Malignant neoplasm of unspecified part of right bronchus or lung: Secondary | ICD-10-CM

## 2018-08-18 DIAGNOSIS — C3411 Malignant neoplasm of upper lobe, right bronchus or lung: Secondary | ICD-10-CM

## 2018-08-18 DIAGNOSIS — K219 Gastro-esophageal reflux disease without esophagitis: Secondary | ICD-10-CM

## 2018-08-18 DIAGNOSIS — Z5111 Encounter for antineoplastic chemotherapy: Secondary | ICD-10-CM

## 2018-08-18 DIAGNOSIS — Z79899 Other long term (current) drug therapy: Secondary | ICD-10-CM

## 2018-08-18 DIAGNOSIS — Z7982 Long term (current) use of aspirin: Secondary | ICD-10-CM

## 2018-08-18 DIAGNOSIS — Z510 Encounter for antineoplastic radiation therapy: Secondary | ICD-10-CM | POA: Diagnosis not present

## 2018-08-18 DIAGNOSIS — E785 Hyperlipidemia, unspecified: Secondary | ICD-10-CM | POA: Insufficient documentation

## 2018-08-18 LAB — CBC WITH DIFFERENTIAL (CANCER CENTER ONLY)
Abs Immature Granulocytes: 0.03 10*3/uL (ref 0.00–0.07)
Basophils Absolute: 0 10*3/uL (ref 0.0–0.1)
Basophils Relative: 1 %
Eosinophils Absolute: 0.1 10*3/uL (ref 0.0–0.5)
Eosinophils Relative: 2 %
HCT: 36.4 % (ref 36.0–46.0)
Hemoglobin: 12 g/dL (ref 12.0–15.0)
Immature Granulocytes: 1 %
Lymphocytes Relative: 13 %
Lymphs Abs: 0.4 10*3/uL — ABNORMAL LOW (ref 0.7–4.0)
MCH: 29.3 pg (ref 26.0–34.0)
MCHC: 33 g/dL (ref 30.0–36.0)
MCV: 88.8 fL (ref 80.0–100.0)
MONOS PCT: 15 %
Monocytes Absolute: 0.5 10*3/uL (ref 0.1–1.0)
NEUTROS ABS: 2.2 10*3/uL (ref 1.7–7.7)
NEUTROS PCT: 68 %
Platelet Count: 272 10*3/uL (ref 150–400)
RBC: 4.1 MIL/uL (ref 3.87–5.11)
RDW: 17.8 % — ABNORMAL HIGH (ref 11.5–15.5)
WBC Count: 3.3 10*3/uL — ABNORMAL LOW (ref 4.0–10.5)
nRBC: 0 % (ref 0.0–0.2)

## 2018-08-18 LAB — CMP (CANCER CENTER ONLY)
ALT: 12 U/L (ref 0–44)
AST: 14 U/L — ABNORMAL LOW (ref 15–41)
Albumin: 3.4 g/dL — ABNORMAL LOW (ref 3.5–5.0)
Alkaline Phosphatase: 90 U/L (ref 38–126)
Anion gap: 9 (ref 5–15)
BUN: 12 mg/dL (ref 8–23)
CO2: 25 mmol/L (ref 22–32)
Calcium: 9 mg/dL (ref 8.9–10.3)
Chloride: 104 mmol/L (ref 98–111)
Creatinine: 1.05 mg/dL — ABNORMAL HIGH (ref 0.44–1.00)
GFR, EST NON AFRICAN AMERICAN: 53 mL/min — AB (ref >60)
GFR, Est AFR Am: >60 mL/min (ref >60)
Glucose, Bld: 77 mg/dL (ref 70–99)
Potassium: 4.1 mmol/L (ref 3.5–5.1)
Sodium: 138 mmol/L (ref 135–145)
Total Bilirubin: 0.6 mg/dL (ref 0.3–1.2)
Total Protein: 6.9 g/dL (ref 6.5–8.1)

## 2018-08-18 MED ORDER — SODIUM CHLORIDE 0.9 % IV SOLN
179.2000 mg | Freq: Once | INTRAVENOUS | Status: AC
  Administered 2018-08-18: 180 mg via INTRAVENOUS
  Filled 2018-08-18: qty 18

## 2018-08-18 MED ORDER — SODIUM CHLORIDE 0.9 % IV SOLN
Freq: Once | INTRAVENOUS | Status: AC
  Administered 2018-08-18: 10:00:00 via INTRAVENOUS
  Filled 2018-08-18: qty 250

## 2018-08-18 MED ORDER — SODIUM CHLORIDE 0.9 % IV SOLN
20.0000 mg | Freq: Once | INTRAVENOUS | Status: AC
  Administered 2018-08-18: 20 mg via INTRAVENOUS
  Filled 2018-08-18: qty 2

## 2018-08-18 MED ORDER — SODIUM CHLORIDE 0.9 % IV SOLN
20.0000 mg | Freq: Once | INTRAVENOUS | Status: AC
  Administered 2018-08-18: 20 mg via INTRAVENOUS
  Filled 2018-08-18: qty 20

## 2018-08-18 MED ORDER — DIPHENHYDRAMINE HCL 50 MG/ML IJ SOLN
50.0000 mg | Freq: Once | INTRAMUSCULAR | Status: AC
  Administered 2018-08-18: 50 mg via INTRAVENOUS

## 2018-08-18 MED ORDER — PALONOSETRON HCL INJECTION 0.25 MG/5ML
0.2500 mg | Freq: Once | INTRAVENOUS | Status: AC
  Administered 2018-08-18: 0.25 mg via INTRAVENOUS

## 2018-08-18 MED ORDER — DIPHENHYDRAMINE HCL 50 MG/ML IJ SOLN
INTRAMUSCULAR | Status: AC
  Filled 2018-08-18: qty 1

## 2018-08-18 MED ORDER — SODIUM CHLORIDE 0.9 % IV SOLN
45.0000 mg/m2 | Freq: Once | INTRAVENOUS | Status: AC
  Administered 2018-08-18: 84 mg via INTRAVENOUS
  Filled 2018-08-18: qty 14

## 2018-08-18 MED ORDER — DEXAMETHASONE SODIUM PHOSPHATE 10 MG/ML IJ SOLN
INTRAMUSCULAR | Status: AC
  Filled 2018-08-18: qty 1

## 2018-08-18 MED ORDER — PALONOSETRON HCL INJECTION 0.25 MG/5ML
INTRAVENOUS | Status: AC
  Filled 2018-08-18: qty 5

## 2018-08-18 MED ORDER — SODIUM CHLORIDE 0.9% FLUSH
10.0000 mL | INTRAVENOUS | Status: DC | PRN
  Filled 2018-08-18: qty 10

## 2018-08-18 MED ORDER — FAMOTIDINE IN NACL 20-0.9 MG/50ML-% IV SOLN: 20.0000 mg | Freq: Once | INTRAVENOUS | Status: DC

## 2018-08-18 MED ORDER — HEPARIN SOD (PORK) LOCK FLUSH 100 UNIT/ML IV SOLN
500.0000 [IU] | Freq: Once | INTRAVENOUS | Status: DC | PRN
  Filled 2018-08-18: qty 5

## 2018-08-18 NOTE — Progress Notes (Signed)
[place] OFFICE PROGRESS NOTE  [name], MD [address] [place] [id number]  DIAGNOSIS: stage IIIB(T4, N2, M0)non-small celllung cancer,poorly differentiated squamous cell carcinoma. She presented with large right upper lobe lung mass in addition to right hilar and mediastinal lymphadenopathydiagnosed in January 2020.  PRIOR THERAPY: None  CURRENT THERAPY: Concurrent chemoradiation with weekly carboplatin for AUC of 2 and paclitaxel 45 mg/M2. First dose July 13, 2018.Status post 5 cycles  INTERVAL HISTORY: Christina [name] 73 y.o. female returns to the clinic today accompanied by her husband.  The patient is feeling well today without any concerning complaints except for odynophagia secondary to her radiation treatment.  Presently she manages this with Carafate and by eating soft foods. She has not lost any weight. She has an appointment with a radiation oncology provider later this week. Her last radiation treatment is scheduled on 09/03/2018.  Otherwise the patient has tolerated her chemotherapy treatment well without any adverse effects.  She denies any fevers, chills, or night sweats.  She denies any chest pain, shortness of breath, cough, or hemoptysis.  She denies any nausea, vomiting, diarrhea, or constipation.  She denies any headaches or visual changes.  She is here today for evaluation before starting cycle #6.   MEDICAL HISTORY: Past Medical History:  Diagnosis Date  . Allergy   . Cancer ([place])    lung cancer  . GERD (gastroesophageal reflux disease)   . Hyperlipidemia   . Hypertension   . Reflux   . Tubulovillous adenoma of colon 2003    ALLERGIES:  is allergic to coconut oil; codeine; shrimp [shellfish allergy]; and trandolapril.  MEDICATIONS:  Current Outpatient Medications  Medication Sig Dispense Refill  . albuterol (PROAIR HFA) 108 (90 Base) MCG/ACT inhaler INHALE 2 PUFFS INTO THE LUNGS EVERY 6 HOURS AS  NEEDED FOR WHEEZING OR SHORTNESS OF BREATH (Patient taking differently: Inhale 2 puffs into the lungs every 6 (six) hours as needed for wheezing or shortness of breath. ) 8.5 g 0  . amLODipine (NORVASC) 10 MG tablet TAKE 1 TABLET(10 MG) BY MOUTH DAILY 90 tablet 0  . aspirin EC 81 MG tablet Take 81 mg by mouth daily.    Marland Kitchen atorvastatin (LIPITOR) 40 MG tablet TAKE 1 TABLET BY MOUTH DAILY 30 tablet 0  . omeprazole (PRILOSEC) 40 MG capsule Take 1 capsule (40 mg total) by mouth daily. 90 capsule 0  . prochlorperazine (COMPAZINE) 10 MG tablet Take 1 tablet (10 mg total) by mouth every 6 (six) hours as needed for nausea or vomiting. 30 tablet 0  . sucralfate (CARAFATE) 1 g tablet TAKE 1 TABLET BY MOUTH 1 HOUR BEFORE MEALS AND AT BEDTIME AS NEEDED (Patient taking differently: Take 1 g by mouth 4 (four) times daily as needed (stomach acid). ) 120 tablet prn   No current facility-administered medications for this visit.    Facility-Administered Medications Ordered in Other Visits  Medication Dose Route Frequency Provider Last Rate Last Dose  . 0.9 %  sodium chloride infusion   Intravenous Once Curt Bears, MD      . CARBOplatin (PARAPLATIN) 180 mg in sodium chloride 0.9 % 250 mL chemo infusion  180 mg Intravenous Once Curt Bears, MD      . dexamethasone (DECADRON) 20 mg in sodium chloride 0.9 % 50 mL IVPB  20 mg Intravenous Once Curt Bears, MD      . diphenhydrAMINE (BENADRYL) injection 50 mg  50 mg Intravenous Once Curt Bears,  MD      . famotidine (PEPCID) 20 mg in sodium chloride 0.9 % 100 mL IVPB  20 mg Intravenous Once Curt Bears, MD      . heparin lock flush 100 unit/mL  500 Units Intracatheter Once PRN Curt Bears, MD      . PACLitaxel (TAXOL) 84 mg in sodium chloride 0.9 % 250 mL chemo infusion (</= 80mg /m2)  45 mg/m2 (Treatment Plan Recorded) Intravenous Once Curt Bears, MD      . palonosetron (ALOXI) injection 0.25 mg  0.25 mg Intravenous Once Curt Bears, MD      . sodium chloride flush (NS) 0.9 % injection 10 mL  10 mL Intracatheter PRN Curt Bears, MD        SURGICAL HISTORY:  Past Surgical History:  Procedure Laterality Date  . CESAREAN SECTION     1 time  . COLONOSCOPY    . VIDEO BRONCHOSCOPY WITH ENDOBRONCHIAL NAVIGATION N/A 06/24/2018   Procedure: VIDEO BRONCHOSCOPY WITH ENDOBRONCHIAL NAVIGATION with biopsies, right upper lung lobe;  Surgeon: Melrose Nakayama, MD;  Location: [facility] OR;  Service: Thoracic;  Laterality: N/A;  . VIDEO BRONCHOSCOPY WITH ENDOBRONCHIAL ULTRASOUND N/A 06/24/2018   Procedure: VIDEO BRONCHOSCOPY WITH ENDOBRONCHIAL ULTRASOUND, right upper lung lobe;  Surgeon: Melrose Nakayama, MD;  Location: [facility] OR;  Service: Thoracic;  Laterality: N/A;    REVIEW OF SYSTEMS:   Review of Systems  Constitutional: Negative for appetite change, chills, fatigue, fever and unexpected weight change.  HENT:   Positive for sore throat with eating and mild trouble swallowing. Negative for mouth sores and nosebleeds Eyes: Negative for eye problems and icterus.  Respiratory: Negative for cough, hemoptysis, shortness of breath and wheezing.   Cardiovascular: Negative for chest pain and leg swelling.  Gastrointestinal: Negative for abdominal pain, constipation, diarrhea, nausea and vomiting.  Genitourinary: Negative for bladder incontinence, difficulty urinating, dysuria, frequency and hematuria.   Musculoskeletal: Negative for back pain, gait problem, neck pain and neck stiffness.  Skin: Negative for itching and rash.  Neurological: Negative for dizziness, extremity weakness, gait problem, headaches, light-headedness and seizures.  Hematological: Negative for adenopathy. Does not bruise/bleed easily.  Psychiatric/Behavioral: Negative for confusion, depression and sleep disturbance. The patient is not nervous/anxious.     PHYSICAL EXAMINATION:  Blood pressure 105/73, pulse 85, temperature 98.5 F (36.9 C), temperature  source Oral, resp. rate 18, height 5\' 7"  (1.702 m), weight 171 lb 1.6 oz (77.6 kg), SpO2 99 %.  ECOG PERFORMANCE STATUS: 1 - Symptomatic but completely ambulatory  Physical Exam  Constitutional: Oriented to person, place, and time and well-developed, well-nourished, and in no distress. No distress.  HENT:  Head: Normocephalic and atraumatic.  Mouth/Throat: Oropharynx is clear and moist. No oropharyngeal exudate.  Eyes: Conjunctivae are normal. Right eye exhibits no discharge. Left eye exhibits no discharge. No scleral icterus.  Neck: Normal range of motion. Neck supple.  Cardiovascular: Normal rate, regular rhythm, normal heart sounds and intact distal pulses.   Pulmonary/Chest: Effort normal and breath sounds normal. No respiratory distress. No wheezes. No rales.  Abdominal: Soft. Bowel sounds are normal. Exhibits no distension and no mass. There is no tenderness.  Musculoskeletal: Normal range of motion. Exhibits no edema.  Lymphadenopathy:    No cervical adenopathy.  Neurological: Alert and oriented to person, place, and time. Exhibits normal muscle tone. Gait normal. Coordination normal.  Skin: Skin is warm and dry. No rash noted. Not diaphoretic. No erythema. No pallor.  Psychiatric: Mood, memory and judgment normal.  Vitals  reviewed.  LABORATORY DATA: Lab Results  Component Value Date   WBC 3.3 (L) 08/18/2018   HGB 12.0 08/18/2018   HCT 36.4 08/18/2018   MCV 88.8 08/18/2018   PLT 272 08/18/2018      Chemistry      Component Value Date/Time   NA 138 08/18/2018 0909   NA 144 04/02/2017 1435   K 4.1 08/18/2018 0909   CL 104 08/18/2018 0909   CO2 25 08/18/2018 0909   BUN 12 08/18/2018 0909   BUN 11 04/02/2017 1435   CREATININE 1.05 (H) 08/18/2018 0909   CREATININE 0.87 03/22/2016 0908      Component Value Date/Time   CALCIUM 9.0 08/18/2018 0909   ALKPHOS 90 08/18/2018 0909   AST 14 (L) 08/18/2018 0909   ALT 12 08/18/2018 0909   BILITOT 0.6 08/18/2018 0909        RADIOGRAPHIC STUDIES:  No results found.   ASSESSMENT/PLAN:  This is a very pleasant 73 year old African-American female diagnosed with stage IIIb (T4, N2, M0) non-small cell lung cancer shown to be poorly differentiated squamous cell carcinoma.  She presented with an upper right lobe lung mass in addition to right hilar and mediastinal lymphadenopathy.  She was diagnosed in January 2020. The patient is undergoing concurrent chemoradiation with weekly carboplatin for an AUC of 2 and paclitaxel 45 mg/m.  She is status post 5 cycles. Patient was seen with Dr. Julien Nordmann today.  She tolerated treatment well without any adverse side effects.  Labs were reviewed with the patient.  Recommend she proceed with cycle #6 today as scheduled.  Her last radiation treatment is scheduled for September 03, 2018.  We will plan on adding an additional chemotherapy treatment on September 01, 2018. She will return for follow-up visit in 2 weeks for evaluation and repeat blood work prior to starting cycle number 8.  For the odynophagia, the patient will follow-up with radiation oncology later this week. She has not lost any weight and continues to eat soft foods. She will continue to use Carafate during this time.  The patient was advised to call immediately if she has any concerning symptoms in the interval. The patient voices understanding of current disease status and treatment options and is in agreement with the current care plan. All questions were answered. The patient knows to call the clinic with any problems, questions or concerns. We can certainly see the patient much sooner if necessary   Orders Placed This Encounter  Procedures  . CBC with Differential (Cancer Center Only)    Standing Status:   Future    Standing Expiration Date:   08/18/2019  . CMP ([place] only)    Standing Status:   Future    Standing Expiration Date:   08/18/2019     Christina Sos Keslie Gritz,  PA-C 08/18/18  ADDENDUM: Hematology/Oncology Attending: I had a face-to-face encounter with the patient today.  I recommended her care plan.  This is a very pleasant 73 years old African-American female with a stage IIIb non-small cell lung cancer, poorly differentiated squamous cell carcinoma.  The patient is currently undergoing a course of concurrent chemoradiation with weekly carboplatin and paclitaxel status post 5 cycles.  She has been tolerating this treatment well with no concerning adverse effect except for mild odynophagia.  She was started by Dr. Tammi Klippel on Carafate and tolerating it well. I recommended for the patient to continue her current course of concurrent chemoradiation. We will see her back for follow-up visit in 2 weeks  for evaluation before starting cycle #8. The patient was advised to call immediately if she has any concerning symptoms in the interval.  Disclaimer: This note was dictated with voice recognition software. Similar sounding words can inadvertently be transcribed and may be missed upon review. Christina Kempf, MD 08/18/18

## 2018-08-18 NOTE — Patient Instructions (Signed)
   [facility] Discharge Instructions for Patients Receiving Chemotherapy  Today you received the following chemotherapy agents Taxol and Carboplatin   To help prevent nausea and vomiting after your treatment, we encourage you to take your nausea medication as directed.    If you develop nausea and vomiting that is not controlled by your nausea medication, call the clinic.   BELOW ARE SYMPTOMS THAT SHOULD BE REPORTED IMMEDIATELY:  *FEVER GREATER THAN 100.5 F  *CHILLS WITH OR WITHOUT FEVER  NAUSEA AND VOMITING THAT IS NOT CONTROLLED WITH YOUR NAUSEA MEDICATION  *UNUSUAL SHORTNESS OF BREATH  *UNUSUAL BRUISING OR BLEEDING  TENDERNESS IN MOUTH AND THROAT WITH OR WITHOUT PRESENCE OF ULCERS  *URINARY PROBLEMS  *BOWEL PROBLEMS  UNUSUAL RASH Items with * indicate a potential emergency and should be followed up as soon as possible.  Feel free to call the clinic should you have any questions or concerns. The clinic phone number is [phone].  Please show the CHEMO ALERT CARD at check-in to the Emergency Department and triage nurse.   

## 2018-08-19 ENCOUNTER — Ambulatory Visit
Admission: RE | Admit: 2018-08-19 | Discharge: 2018-08-19 | Disposition: A | Discharge status: Home / Self Care | Payer: BLUE CROSS/BLUE SHIELD | Source: Ambulatory Visit | Attending: Radiation Oncology | Admitting: Radiation Oncology

## 2018-08-19 DIAGNOSIS — Z510 Encounter for antineoplastic radiation therapy: Secondary | ICD-10-CM | POA: Diagnosis not present

## 2018-08-19 DIAGNOSIS — C3411 Malignant neoplasm of upper lobe, right bronchus or lung: Secondary | ICD-10-CM | POA: Diagnosis not present

## 2018-08-20 ENCOUNTER — Ambulatory Visit
Admission: RE | Admit: 2018-08-20 | Discharge: 2018-08-20 | Disposition: A | Discharge status: Home / Self Care | Payer: BLUE CROSS/BLUE SHIELD | Source: Ambulatory Visit | Attending: Radiation Oncology | Admitting: Radiation Oncology

## 2018-08-20 DIAGNOSIS — Z510 Encounter for antineoplastic radiation therapy: Secondary | ICD-10-CM | POA: Diagnosis not present

## 2018-08-20 DIAGNOSIS — C3411 Malignant neoplasm of upper lobe, right bronchus or lung: Secondary | ICD-10-CM | POA: Diagnosis not present

## 2018-08-21 ENCOUNTER — Other Ambulatory Visit: Payer: Self-pay | Admitting: Radiation Oncology

## 2018-08-21 ENCOUNTER — Ambulatory Visit
Admission: RE | Admit: 2018-08-21 | Discharge: 2018-08-21 | Disposition: A | Discharge status: Home / Self Care | Payer: BLUE CROSS/BLUE SHIELD | Source: Ambulatory Visit | Attending: Radiation Oncology | Admitting: Radiation Oncology

## 2018-08-21 DIAGNOSIS — Z510 Encounter for antineoplastic radiation therapy: Secondary | ICD-10-CM | POA: Diagnosis not present

## 2018-08-21 DIAGNOSIS — C3411 Malignant neoplasm of upper lobe, right bronchus or lung: Secondary | ICD-10-CM | POA: Diagnosis not present

## 2018-08-21 MED ORDER — HYDROCODONE-ACETAMINOPHEN 7.5-325 MG/15ML PO SOLN: 15.0000 mL | Freq: Four times a day (QID) | ORAL | 0 refills | Status: DC | PRN

## 2018-08-24 ENCOUNTER — Ambulatory Visit
Admission: RE | Admit: 2018-08-24 | Discharge: 2018-08-24 | Disposition: A | Discharge status: Home / Self Care | Payer: BLUE CROSS/BLUE SHIELD | Source: Ambulatory Visit | Attending: Radiation Oncology | Admitting: Radiation Oncology

## 2018-08-24 DIAGNOSIS — Z510 Encounter for antineoplastic radiation therapy: Secondary | ICD-10-CM | POA: Diagnosis not present

## 2018-08-24 DIAGNOSIS — C3411 Malignant neoplasm of upper lobe, right bronchus or lung: Secondary | ICD-10-CM | POA: Diagnosis not present

## 2018-08-25 ENCOUNTER — Other Ambulatory Visit: Payer: Self-pay

## 2018-08-25 ENCOUNTER — Inpatient Hospital Stay: Payer: BLUE CROSS/BLUE SHIELD

## 2018-08-25 ENCOUNTER — Ambulatory Visit
Admission: RE | Admit: 2018-08-25 | Discharge: 2018-08-25 | Disposition: A | Discharge status: Home / Self Care | Payer: BLUE CROSS/BLUE SHIELD | Source: Ambulatory Visit | Attending: Radiation Oncology | Admitting: Radiation Oncology

## 2018-08-25 VITALS — BP 105/81 | HR 82 | Temp 98.3°F | Resp 18

## 2018-08-25 DIAGNOSIS — C3491 Malignant neoplasm of unspecified part of right bronchus or lung: Secondary | ICD-10-CM

## 2018-08-25 DIAGNOSIS — Z510 Encounter for antineoplastic radiation therapy: Secondary | ICD-10-CM | POA: Diagnosis not present

## 2018-08-25 DIAGNOSIS — C3411 Malignant neoplasm of upper lobe, right bronchus or lung: Secondary | ICD-10-CM | POA: Diagnosis not present

## 2018-08-25 LAB — CMP (CANCER CENTER ONLY)
ALBUMIN: 3.2 g/dL — AB (ref 3.5–5.0)
ALK PHOS: 79 U/L (ref 38–126)
ALT: 11 U/L (ref 0–44)
AST: 12 U/L — ABNORMAL LOW (ref 15–41)
Anion gap: 9 (ref 5–15)
BUN: 10 mg/dL (ref 8–23)
CO2: 25 mmol/L (ref 22–32)
Calcium: 8.8 mg/dL — ABNORMAL LOW (ref 8.9–10.3)
Chloride: 105 mmol/L (ref 98–111)
Creatinine: 0.94 mg/dL (ref 0.44–1.00)
GFR, Est AFR Am: >60 mL/min (ref >60)
GFR, Estimated: >60 mL/min (ref >60)
GLUCOSE: 110 mg/dL — AB (ref 70–99)
Potassium: 4 mmol/L (ref 3.5–5.1)
Sodium: 139 mmol/L (ref 135–145)
Total Bilirubin: 0.5 mg/dL (ref 0.3–1.2)
Total Protein: 6.3 g/dL — ABNORMAL LOW (ref 6.5–8.1)

## 2018-08-25 LAB — CBC WITH DIFFERENTIAL (CANCER CENTER ONLY)
Abs Immature Granulocytes: 0.02 10*3/uL (ref 0.00–0.07)
Basophils Absolute: 0 10*3/uL (ref 0.0–0.1)
Basophils Relative: 1 %
Eosinophils Absolute: 0.1 10*3/uL (ref 0.0–0.5)
Eosinophils Relative: 3 %
HCT: 36.9 % (ref 36.0–46.0)
Hemoglobin: 11.9 g/dL — ABNORMAL LOW (ref 12.0–15.0)
Immature Granulocytes: 1 %
LYMPHS PCT: 19 %
Lymphs Abs: 0.5 10*3/uL — ABNORMAL LOW (ref 0.7–4.0)
MCH: 29 pg (ref 26.0–34.0)
MCHC: 32.2 g/dL (ref 30.0–36.0)
MCV: 90 fL (ref 80.0–100.0)
Monocytes Absolute: 0.3 10*3/uL (ref 0.1–1.0)
Monocytes Relative: 11 %
Neutro Abs: 1.7 10*3/uL (ref 1.7–7.7)
Neutrophils Relative %: 65 %
Platelet Count: 270 10*3/uL (ref 150–400)
RBC: 4.1 MIL/uL (ref 3.87–5.11)
RDW: 18.3 % — ABNORMAL HIGH (ref 11.5–15.5)
WBC Count: 2.6 10*3/uL — ABNORMAL LOW (ref 4.0–10.5)
nRBC: 0 % (ref 0.0–0.2)

## 2018-08-25 MED ORDER — SODIUM CHLORIDE 0.9 % IV SOLN
Freq: Once | INTRAVENOUS | Status: AC
  Administered 2018-08-25: 09:00:00 via INTRAVENOUS
  Filled 2018-08-25: qty 250

## 2018-08-25 MED ORDER — PALONOSETRON HCL INJECTION 0.25 MG/5ML
INTRAVENOUS | Status: AC
  Filled 2018-08-25: qty 5

## 2018-08-25 MED ORDER — DIPHENHYDRAMINE HCL 50 MG/ML IJ SOLN
50.0000 mg | Freq: Once | INTRAMUSCULAR | Status: AC
  Administered 2018-08-25: 50 mg via INTRAVENOUS

## 2018-08-25 MED ORDER — PALONOSETRON HCL INJECTION 0.25 MG/5ML
0.2500 mg | Freq: Once | INTRAVENOUS | Status: AC
  Administered 2018-08-25: 0.25 mg via INTRAVENOUS

## 2018-08-25 MED ORDER — SODIUM CHLORIDE 0.9 % IV SOLN
20.0000 mg | Freq: Once | INTRAVENOUS | Status: AC
  Administered 2018-08-25: 20 mg via INTRAVENOUS
  Filled 2018-08-25: qty 2

## 2018-08-25 MED ORDER — FAMOTIDINE IN NACL 20-0.9 MG/50ML-% IV SOLN: 20.0000 mg | Freq: Once | INTRAVENOUS | Status: DC

## 2018-08-25 MED ORDER — SODIUM CHLORIDE 0.9 % IV SOLN
45.0000 mg/m2 | Freq: Once | INTRAVENOUS | Status: AC
  Administered 2018-08-25: 84 mg via INTRAVENOUS
  Filled 2018-08-25: qty 14

## 2018-08-25 MED ORDER — DIPHENHYDRAMINE HCL 50 MG/ML IJ SOLN
INTRAMUSCULAR | Status: AC
  Filled 2018-08-25: qty 1

## 2018-08-25 MED ORDER — SODIUM CHLORIDE 0.9 % IV SOLN
179.2000 mg | Freq: Once | INTRAVENOUS | Status: AC
  Administered 2018-08-25: 180 mg via INTRAVENOUS
  Filled 2018-08-25: qty 18

## 2018-08-25 NOTE — Patient Instructions (Signed)
   [facility] Discharge Instructions for Patients Receiving Chemotherapy  Today you received the following chemotherapy agents Taxol and Carboplatin   To help prevent nausea and vomiting after your treatment, we encourage you to take your nausea medication as directed.    If you develop nausea and vomiting that is not controlled by your nausea medication, call the clinic.   BELOW ARE SYMPTOMS THAT SHOULD BE REPORTED IMMEDIATELY:  *FEVER GREATER THAN 100.5 F  *CHILLS WITH OR WITHOUT FEVER  NAUSEA AND VOMITING THAT IS NOT CONTROLLED WITH YOUR NAUSEA MEDICATION  *UNUSUAL SHORTNESS OF BREATH  *UNUSUAL BRUISING OR BLEEDING  TENDERNESS IN MOUTH AND THROAT WITH OR WITHOUT PRESENCE OF ULCERS  *URINARY PROBLEMS  *BOWEL PROBLEMS  UNUSUAL RASH Items with * indicate a potential emergency and should be followed up as soon as possible.  Feel free to call the clinic should you have any questions or concerns. The clinic phone number is [phone].  Please show the CHEMO ALERT CARD at check-in to the Emergency Department and triage nurse.   

## 2018-08-26 ENCOUNTER — Ambulatory Visit
Admission: RE | Admit: 2018-08-26 | Discharge: 2018-08-26 | Disposition: A | Discharge status: Home / Self Care | Payer: BLUE CROSS/BLUE SHIELD | Source: Ambulatory Visit | Attending: Radiation Oncology | Admitting: Radiation Oncology

## 2018-08-26 DIAGNOSIS — C3411 Malignant neoplasm of upper lobe, right bronchus or lung: Secondary | ICD-10-CM | POA: Diagnosis not present

## 2018-08-26 DIAGNOSIS — Z510 Encounter for antineoplastic radiation therapy: Secondary | ICD-10-CM | POA: Diagnosis not present

## 2018-08-27 ENCOUNTER — Ambulatory Visit
Admission: RE | Admit: 2018-08-27 | Discharge: 2018-08-27 | Disposition: A | Discharge status: Home / Self Care | Payer: BLUE CROSS/BLUE SHIELD | Source: Ambulatory Visit | Attending: Radiation Oncology | Admitting: Radiation Oncology

## 2018-08-27 DIAGNOSIS — Z510 Encounter for antineoplastic radiation therapy: Secondary | ICD-10-CM | POA: Diagnosis not present

## 2018-08-27 DIAGNOSIS — C3411 Malignant neoplasm of upper lobe, right bronchus or lung: Secondary | ICD-10-CM | POA: Diagnosis not present

## 2018-08-28 ENCOUNTER — Ambulatory Visit
Admission: RE | Admit: 2018-08-28 | Discharge: 2018-08-28 | Disposition: A | Discharge status: Home / Self Care | Payer: BLUE CROSS/BLUE SHIELD | Source: Ambulatory Visit | Attending: Radiation Oncology | Admitting: Radiation Oncology

## 2018-08-28 DIAGNOSIS — Z510 Encounter for antineoplastic radiation therapy: Secondary | ICD-10-CM | POA: Diagnosis not present

## 2018-08-28 DIAGNOSIS — C3411 Malignant neoplasm of upper lobe, right bronchus or lung: Secondary | ICD-10-CM | POA: Diagnosis not present

## 2018-08-31 ENCOUNTER — Inpatient Hospital Stay: Payer: BLUE CROSS/BLUE SHIELD

## 2018-08-31 ENCOUNTER — Inpatient Hospital Stay (HOSPITAL_BASED_OUTPATIENT_CLINIC_OR_DEPARTMENT_OTHER): Payer: BLUE CROSS/BLUE SHIELD | Admitting: Internal Medicine

## 2018-08-31 ENCOUNTER — Encounter: Payer: Self-pay | Admitting: Internal Medicine

## 2018-08-31 ENCOUNTER — Other Ambulatory Visit: Payer: Self-pay

## 2018-08-31 ENCOUNTER — Ambulatory Visit
Admission: RE | Admit: 2018-08-31 | Discharge: 2018-08-31 | Disposition: A | Discharge status: Home / Self Care | Payer: BLUE CROSS/BLUE SHIELD | Source: Ambulatory Visit | Attending: Radiation Oncology | Admitting: Radiation Oncology

## 2018-08-31 VITALS — HR 95

## 2018-08-31 VITALS — BP 116/83 | HR 105 | Temp 98.8°F | Resp 18 | Ht 67.0 in | Wt 170.8 lb

## 2018-08-31 DIAGNOSIS — C3491 Malignant neoplasm of unspecified part of right bronchus or lung: Secondary | ICD-10-CM

## 2018-08-31 DIAGNOSIS — Z79899 Other long term (current) drug therapy: Secondary | ICD-10-CM

## 2018-08-31 DIAGNOSIS — Z510 Encounter for antineoplastic radiation therapy: Secondary | ICD-10-CM | POA: Diagnosis not present

## 2018-08-31 DIAGNOSIS — C3411 Malignant neoplasm of upper lobe, right bronchus or lung: Secondary | ICD-10-CM

## 2018-08-31 DIAGNOSIS — C3490 Malignant neoplasm of unspecified part of unspecified bronchus or lung: Secondary | ICD-10-CM

## 2018-08-31 DIAGNOSIS — Z5111 Encounter for antineoplastic chemotherapy: Secondary | ICD-10-CM

## 2018-08-31 LAB — CBC WITH DIFFERENTIAL (CANCER CENTER ONLY)
Abs Immature Granulocytes: 0.02 10*3/uL (ref 0.00–0.07)
Basophils Absolute: 0 10*3/uL (ref 0.0–0.1)
Basophils Relative: 0 %
Eosinophils Absolute: 0 10*3/uL (ref 0.0–0.5)
Eosinophils Relative: 2 %
HCT: 36.2 % (ref 36.0–46.0)
HEMOGLOBIN: 11.7 g/dL — AB (ref 12.0–15.0)
Immature Granulocytes: 1 %
LYMPHS PCT: 14 %
Lymphs Abs: 0.4 10*3/uL — ABNORMAL LOW (ref 0.7–4.0)
MCH: 29.1 pg (ref 26.0–34.0)
MCHC: 32.3 g/dL (ref 30.0–36.0)
MCV: 90 fL (ref 80.0–100.0)
MONO ABS: 0.3 10*3/uL (ref 0.1–1.0)
Monocytes Relative: 11 %
Neutro Abs: 2 10*3/uL (ref 1.7–7.7)
Neutrophils Relative %: 72 %
Platelet Count: 234 10*3/uL (ref 150–400)
RBC: 4.02 MIL/uL (ref 3.87–5.11)
RDW: 18.7 % — ABNORMAL HIGH (ref 11.5–15.5)
WBC Count: 2.8 10*3/uL — ABNORMAL LOW (ref 4.0–10.5)
nRBC: 0 % (ref 0.0–0.2)

## 2018-08-31 LAB — CMP (CANCER CENTER ONLY)
ALT: 13 U/L (ref 0–44)
AST: 12 U/L — ABNORMAL LOW (ref 15–41)
Albumin: 3.4 g/dL — ABNORMAL LOW (ref 3.5–5.0)
Alkaline Phosphatase: 81 U/L (ref 38–126)
Anion gap: 10 (ref 5–15)
BUN: 12 mg/dL (ref 8–23)
CALCIUM: 9.1 mg/dL (ref 8.9–10.3)
CO2: 24 mmol/L (ref 22–32)
Chloride: 104 mmol/L (ref 98–111)
Creatinine: 1.03 mg/dL — ABNORMAL HIGH (ref 0.44–1.00)
GFR, Est AFR Am: >60 mL/min (ref >60)
GFR, Estimated: 55 mL/min — ABNORMAL LOW (ref >60)
Glucose, Bld: 120 mg/dL — ABNORMAL HIGH (ref 70–99)
Potassium: 3.9 mmol/L (ref 3.5–5.1)
Sodium: 138 mmol/L (ref 135–145)
Total Bilirubin: 0.6 mg/dL (ref 0.3–1.2)
Total Protein: 6.7 g/dL (ref 6.5–8.1)

## 2018-08-31 MED ORDER — PALONOSETRON HCL INJECTION 0.25 MG/5ML
INTRAVENOUS | Status: AC
  Filled 2018-08-31: qty 5

## 2018-08-31 MED ORDER — DIPHENHYDRAMINE HCL 50 MG/ML IJ SOLN
INTRAMUSCULAR | Status: AC
  Filled 2018-08-31: qty 1

## 2018-08-31 MED ORDER — PALONOSETRON HCL INJECTION 0.25 MG/5ML
0.2500 mg | Freq: Once | INTRAVENOUS | Status: AC
  Administered 2018-08-31: 0.25 mg via INTRAVENOUS

## 2018-08-31 MED ORDER — SODIUM CHLORIDE 0.9 % IV SOLN
20.0000 mg | Freq: Once | INTRAVENOUS | Status: AC
  Administered 2018-08-31: 20 mg via INTRAVENOUS
  Filled 2018-08-31: qty 2

## 2018-08-31 MED ORDER — SODIUM CHLORIDE 0.9 % IV SOLN
179.2000 mg | Freq: Once | INTRAVENOUS | Status: AC
  Administered 2018-08-31: 180 mg via INTRAVENOUS
  Filled 2018-08-31: qty 18

## 2018-08-31 MED ORDER — DIPHENHYDRAMINE HCL 50 MG/ML IJ SOLN
50.0000 mg | Freq: Once | INTRAMUSCULAR | Status: AC
  Administered 2018-08-31: 50 mg via INTRAVENOUS

## 2018-08-31 MED ORDER — FAMOTIDINE IN NACL 20-0.9 MG/50ML-% IV SOLN: 20.0000 mg | Freq: Once | INTRAVENOUS | Status: DC

## 2018-08-31 MED ORDER — SODIUM CHLORIDE 0.9 % IV SOLN
Freq: Once | INTRAVENOUS | Status: AC
  Administered 2018-08-31: 13:00:00 via INTRAVENOUS
  Filled 2018-08-31: qty 250

## 2018-08-31 MED ORDER — SODIUM CHLORIDE 0.9 % IV SOLN
45.0000 mg/m2 | Freq: Once | INTRAVENOUS | Status: AC
  Administered 2018-08-31: 84 mg via INTRAVENOUS
  Filled 2018-08-31: qty 14

## 2018-08-31 NOTE — Patient Instructions (Signed)
   [facility] Discharge Instructions for Patients Receiving Chemotherapy  Today you received the following chemotherapy agents Taxol and Carboplatin   To help prevent nausea and vomiting after your treatment, we encourage you to take your nausea medication as directed.    If you develop nausea and vomiting that is not controlled by your nausea medication, call the clinic.   BELOW ARE SYMPTOMS THAT SHOULD BE REPORTED IMMEDIATELY:  *FEVER GREATER THAN 100.5 F  *CHILLS WITH OR WITHOUT FEVER  NAUSEA AND VOMITING THAT IS NOT CONTROLLED WITH YOUR NAUSEA MEDICATION  *UNUSUAL SHORTNESS OF BREATH  *UNUSUAL BRUISING OR BLEEDING  TENDERNESS IN MOUTH AND THROAT WITH OR WITHOUT PRESENCE OF ULCERS  *URINARY PROBLEMS  *BOWEL PROBLEMS  UNUSUAL RASH Items with * indicate a potential emergency and should be followed up as soon as possible.  Feel free to call the clinic should you have any questions or concerns. The clinic phone number is [phone].  Please show the CHEMO ALERT CARD at check-in to the Emergency Department and triage nurse.   

## 2018-08-31 NOTE — Progress Notes (Signed)
[place] [phone]   [phone]  OFFICE PROGRESS NOTE  [name], MD [address] [facility] [place] [address]  DIAGNOSIS: stage IIIB (T4, N2, M0) non-small cell lung cancer, poorly differentiated squamous cell carcinoma.  She presented with large right upper lobe lung mass in addition to right hilar and mediastinal lymphadenopathy diagnosed in January 2020.  PRIOR THERAPY: None  CURRENT THERAPY: Concurrent chemoradiation with weekly carboplatin for AUC of 2 and paclitaxel 45 mg/M2.  First dose July 13, 2018.  Status post 7 cycles.  INTERVAL HISTORY: Christina [name] 73 y.o. female returns to the clinic today for follow-up visit accompanied by her husband.  The patient is feeling fine today with no concerning complaints except for mild odynophagia.  She denied having any chest pain, shortness of breath, cough or hemoptysis.  She denied having any fever or chills.  She has no nausea, vomiting, diarrhea or constipation.  She denied having any headache or visual changes.  She is here today for evaluation before starting cycle #8 of her treatment.  She is expected to complete the course of radiotherapy on September 03, 2018.  MEDICAL HISTORY: Past Medical History:  Diagnosis Date  . Allergy   . Cancer ([place])    lung cancer  . GERD (gastroesophageal reflux disease)   . Hyperlipidemia   . Hypertension   . Reflux   . Tubulovillous adenoma of colon 2003    ALLERGIES:  is allergic to coconut oil; codeine; shrimp [shellfish allergy]; and trandolapril.  MEDICATIONS:  Current Outpatient Medications  Medication Sig Dispense Refill  . albuterol (PROAIR HFA) 108 (90 Base) MCG/ACT inhaler INHALE 2 PUFFS INTO THE LUNGS EVERY 6 HOURS AS NEEDED FOR WHEEZING OR SHORTNESS OF BREATH (Patient taking differently: Inhale 2 puffs into the lungs every 6 (six) hours as needed for wheezing or shortness of breath. ) 8.5 g 0  . amLODipine  (NORVASC) 10 MG tablet TAKE 1 TABLET(10 MG) BY MOUTH DAILY 90 tablet 0  . aspirin EC 81 MG tablet Take 81 mg by mouth daily.    Marland Kitchen atorvastatin (LIPITOR) 40 MG tablet TAKE 1 TABLET BY MOUTH DAILY 30 tablet 0  . HYDROcodone-acetaminophen (HYCET) 7.5-325 mg/15 ml solution Take 15 mLs by mouth 4 (four) times daily as needed. 473 mL 0  . omeprazole (PRILOSEC) 40 MG capsule Take 1 capsule (40 mg total) by mouth daily. 90 capsule 0  . prochlorperazine (COMPAZINE) 10 MG tablet Take 1 tablet (10 mg total) by mouth every 6 (six) hours as needed for nausea or vomiting. 30 tablet 0  . sucralfate (CARAFATE) 1 g tablet TAKE 1 TABLET BY MOUTH 1 HOUR BEFORE MEALS AND AT BEDTIME AS NEEDED (Patient taking differently: Take 1 g by mouth 4 (four) times daily as needed (stomach acid). ) 120 tablet prn   No current facility-administered medications for this visit.     SURGICAL HISTORY:  Past Surgical History:  Procedure Laterality Date  . CESAREAN SECTION     1 time  . COLONOSCOPY    . VIDEO BRONCHOSCOPY WITH ENDOBRONCHIAL NAVIGATION N/A 06/24/2018   Procedure: VIDEO BRONCHOSCOPY WITH ENDOBRONCHIAL NAVIGATION with biopsies, right upper lung lobe;  Surgeon: Melrose Nakayama, MD;  Location: [facility] OR;  Service: Thoracic;  Laterality: N/A;  . VIDEO BRONCHOSCOPY WITH ENDOBRONCHIAL ULTRASOUND N/A 06/24/2018   Procedure: VIDEO BRONCHOSCOPY WITH ENDOBRONCHIAL ULTRASOUND, right upper lung lobe;  Surgeon: Melrose Nakayama, MD;  Location: [place];  Service: Thoracic;  Laterality: N/A;    REVIEW OF SYSTEMS:  A comprehensive review of systems was negative except for: Gastrointestinal: positive for odynophagia   PHYSICAL EXAMINATION: General appearance: alert, cooperative, fatigued and no distress Head: Normocephalic, without obvious abnormality, atraumatic Neck: no adenopathy, no JVD, supple, symmetrical, trachea midline and thyroid not enlarged, symmetric, no tenderness/mass/nodules Lymph nodes: Cervical,  supraclavicular, and axillary nodes normal. Resp: clear to auscultation bilaterally Back: symmetric, no curvature. ROM normal. No CVA tenderness. Cardio: regular rate and rhythm, S1, S2 normal, no murmur, click, rub or gallop GI: soft, non-tender; bowel sounds normal; no masses,  no organomegaly Extremities: extremities normal, atraumatic, no cyanosis or edema  ECOG PERFORMANCE STATUS: 1 - Symptomatic but completely ambulatory  Blood pressure 116/83, pulse (!) 105, temperature 98.8 F (37.1 C), temperature source Oral, resp. rate 18, height 5\' 7"  (1.702 m), weight 170 lb 12.8 oz (77.5 kg), SpO2 100 %.  LABORATORY DATA: Lab Results  Component Value Date   WBC 2.8 (L) 08/31/2018   HGB 11.7 (L) 08/31/2018   HCT 36.2 08/31/2018   MCV 90.0 08/31/2018   PLT 234 08/31/2018      Chemistry      Component Value Date/Time   NA 138 08/31/2018 1059   NA 144 04/02/2017 1435   K 3.9 08/31/2018 1059   CL 104 08/31/2018 1059   CO2 24 08/31/2018 1059   BUN 12 08/31/2018 1059   BUN 11 04/02/2017 1435   CREATININE 1.03 (H) 08/31/2018 1059   CREATININE 0.87 03/22/2016 0908      Component Value Date/Time   CALCIUM 9.1 08/31/2018 1059   ALKPHOS 81 08/31/2018 1059   AST 12 (L) 08/31/2018 1059   ALT 13 08/31/2018 1059   BILITOT 0.6 08/31/2018 1059       RADIOGRAPHIC STUDIES: No results found.  ASSESSMENT AND PLAN: This is a very pleasant 73 years old African-American female recently diagnosed with a stage IIIB (T4, N2, M0) non-small cell lung cancer, poorly differentiated squamous cell carcinoma presented with right upper lobe lung mass in addition to right hilar and mediastinal lymphadenopathy. The patient is currently undergoing concurrent chemoradiation with weekly carboplatin and paclitaxel status post 7 cycles and has been tolerating this treatment fairly well. I recommended for the patient to proceed with cycle #8 today as scheduled. I will see her back for follow-up visit in 4 weeks  for evaluation after repeating CT scan of the chest for restaging of her disease. The patient was advised to call immediately if she has any concerning symptoms in the interval. The patient voices understanding of current disease status and treatment options and is in agreement with the current care plan.  All questions were answered. The patient knows to call the clinic with any problems, questions or concerns. We can certainly see the patient much sooner if necessary.  Disclaimer: This note was dictated with voice recognition software. Similar sounding words can inadvertently be transcribed and may not be corrected upon review.

## 2018-09-01 ENCOUNTER — Telehealth: Payer: Self-pay | Admitting: Family Medicine

## 2018-09-01 ENCOUNTER — Ambulatory Visit
Admission: RE | Admit: 2018-09-01 | Discharge: 2018-09-01 | Disposition: A | Discharge status: Home / Self Care | Payer: BLUE CROSS/BLUE SHIELD | Source: Ambulatory Visit | Attending: Radiation Oncology | Admitting: Radiation Oncology

## 2018-09-01 ENCOUNTER — Other Ambulatory Visit: Payer: Self-pay

## 2018-09-01 DIAGNOSIS — Z510 Encounter for antineoplastic radiation therapy: Secondary | ICD-10-CM | POA: Diagnosis not present

## 2018-09-01 DIAGNOSIS — C3411 Malignant neoplasm of upper lobe, right bronchus or lung: Secondary | ICD-10-CM | POA: Diagnosis not present

## 2018-09-01 DIAGNOSIS — E785 Hyperlipidemia, unspecified: Secondary | ICD-10-CM

## 2018-09-01 MED ORDER — ATORVASTATIN CALCIUM 40 MG PO TABS: ORAL_TABLET | ORAL | 0 refills | Status: DC

## 2018-09-01 NOTE — Telephone Encounter (Signed)
Copied from [place] [id number]. Topic: Quick Communication - Rx Refill/Question >> Sep 01, 2018 11:40 AM Gustavus Messing wrote: Medication: atorvastatin (LIPITOR) 40 MG tablet   Has the patient contacted their pharmacy? Yes.   (Agent: If yes, when and what did the pharmacy advise?) Has no refills.Has been out of this medication for a week  Preferred Pharmacy (with phone number or street name): [facility] [place], [place] [phone] (Phone) [phone] (Fax)    Agent: Please be advised that RX refills may take up to 3 business days. We ask that you follow-up with your pharmacy.

## 2018-09-01 NOTE — Telephone Encounter (Signed)
RX sent to pharmacy  

## 2018-09-02 ENCOUNTER — Telehealth: Payer: Self-pay | Admitting: Internal Medicine

## 2018-09-02 ENCOUNTER — Other Ambulatory Visit: Payer: Self-pay

## 2018-09-02 ENCOUNTER — Ambulatory Visit
Admission: RE | Admit: 2018-09-02 | Discharge: 2018-09-02 | Disposition: A | Discharge status: Home / Self Care | Payer: BLUE CROSS/BLUE SHIELD | Source: Ambulatory Visit | Attending: Radiation Oncology | Admitting: Radiation Oncology

## 2018-09-02 DIAGNOSIS — C3411 Malignant neoplasm of upper lobe, right bronchus or lung: Secondary | ICD-10-CM | POA: Diagnosis not present

## 2018-09-02 DIAGNOSIS — Z510 Encounter for antineoplastic radiation therapy: Secondary | ICD-10-CM | POA: Diagnosis not present

## 2018-09-02 NOTE — Telephone Encounter (Signed)
Spoke with patient re April appointments. Follow up scheduled 4/21 due to no availability the week of 4/13. Central radiology will call re scan.

## 2018-09-03 ENCOUNTER — Other Ambulatory Visit: Payer: Self-pay

## 2018-09-03 ENCOUNTER — Ambulatory Visit
Admission: RE | Admit: 2018-09-03 | Discharge: 2018-09-03 | Disposition: A | Discharge status: Home / Self Care | Payer: BLUE CROSS/BLUE SHIELD | Source: Ambulatory Visit | Attending: Radiation Oncology | Admitting: Radiation Oncology

## 2018-09-03 ENCOUNTER — Encounter: Payer: Self-pay | Admitting: Radiation Oncology

## 2018-09-03 DIAGNOSIS — C3411 Malignant neoplasm of upper lobe, right bronchus or lung: Secondary | ICD-10-CM | POA: Diagnosis not present

## 2018-09-03 DIAGNOSIS — Z510 Encounter for antineoplastic radiation therapy: Secondary | ICD-10-CM | POA: Diagnosis not present

## 2018-09-09 ENCOUNTER — Other Ambulatory Visit: Payer: Self-pay | Admitting: Family Medicine

## 2018-09-09 DIAGNOSIS — K219 Gastro-esophageal reflux disease without esophagitis: Secondary | ICD-10-CM

## 2018-09-10 ENCOUNTER — Telehealth: Payer: Self-pay | Admitting: *Deleted

## 2018-09-10 NOTE — Telephone Encounter (Signed)
CALLED PATIENT TO INFORM THAT Christina [name] WOULD DO HER FU ON 09-30-18 @ 10:40 AM DUE TO COVID 19, PATIENT VERIFIED UNDERSTANDING THIS

## 2018-09-11 ENCOUNTER — Encounter: Payer: Self-pay | Admitting: Radiation Oncology

## 2018-09-11 NOTE — Progress Notes (Signed)
°  Radiation Oncology         [phone] ________________________________  Name: Christina [name] MRN: [id number]  Date: 09/11/2018  DOB: 23-May-1946   End of Treatment Note  Diagnosis:   73 y.o. woman with stage IIIB, pT4N2 NSCLC, squamous cell carcinoma of the RUL     Indication for treatment:  Curative, Chemo-Radiotherapy       Radiation treatment dates:   07/21/2018 - 09/03/2018  Site/dose:   The primary tumor in the RUL lung and involved mediastinal adenopathy were treated to 66 Gy in 33 fractions of 2 Gy.  Beams/energy:   A five field 3D conformal treatment arrangement was used delivering 6 and 10 MV photons.  Daily image-guidance CT was used to align the treatment with the targeted volume  Narrative: The patient tolerated radiation treatment relatively well. She experienced a burning sensation to her chest/distal esophagus and dysphagia, which was relieved with Carafate. She also experienced occasional dry cough that resolved prior to the end of treatment as well as mild fatigue towards the end of treatments.   Plan: The patient has completed radiation treatment. The patient will return to radiation oncology clinic for routine followup in one month. I advised her to call or return sooner if she has any questions or concerns related to her recovery or treatment.  ________________________________  Sheral Apley. Tammi Klippel, M.D.  This document serves as a record of services personally performed by Tyler Pita, MD. It was created on his behalf by Wilburn Mylar, a trained medical scribe. The creation of this record is based on the scribe's personal observations and the provider's statements to them. This document has been checked and approved by the attending provider.

## 2018-09-23 ENCOUNTER — Telehealth: Payer: Self-pay | Admitting: Urology

## 2018-09-23 ENCOUNTER — Ambulatory Visit: Admission: RE | Admit: 2018-09-23 | Payer: BLUE CROSS/BLUE SHIELD | Source: Ambulatory Visit | Admitting: Urology

## 2018-09-23 NOTE — Telephone Encounter (Signed)
Radiation Oncology         [phone] ________________________________  Name: EVGENIA MERRIMAN MRN: [id number]  Date: 09/23/2018  DOB: 1945/07/04  Post Treatment Note  CC: [name], MD  No ref. provider found  Diagnosis:   73 y.o. woman with stage IIIB, pT4N2 NSCLC, squamous cell carcinoma of the RUL     Interval Since Last Radiation:  3 weeks   07/21/2018 - 09/03/2018:  The primary tumor in the RUL lung and involved mediastinal adenopathy were treated to 66 Gy in 33 fractions of 2 Gy, concurrent with chemotherapy.  Narrative:  I spoke with the patient to conduct his routine scheduled 1 month follow up visit via telephone to spare the patient unnecessary potential exposure in the healthcare setting during the current COVID-19 pandemic.  The patient was notified in advance and gave permission to proceed with this visit format. She tolerated radiation treatment relatively well. She experienced a burning sensation to her chest/distal esophagus and dysphagia, which was relieved with Carafate. She also experienced occasional dry cough that resolved prior to the end of treatment as well as mild fatigue towards the end of treatments.                               On review of systems, the patient states that she is doing very well overall.  She reports complete resolution of her esophageal irritation and dysphagia, no longer requiring use of Carafate.  She specifically denies increased productive cough, chest pain, hemoptysis or increased shortness of breath.  She reports a healthy appetite and is maintaining her weight.  She denies abdominal pain, nausea, vomiting, diarrhea or constipation.  She completed her last cycle of chemotherapy on 08/31/2018 and has a scheduled follow-up with Dr. Earlie Server on 10/06/2018.  She will have a repeat CT chest on 09/25/2018.  ALLERGIES:  is allergic to coconut oil; codeine; shrimp [shellfish allergy]; and trandolapril.  Meds: Current Outpatient Medications   Medication Sig Dispense Refill  . albuterol (PROAIR HFA) 108 (90 Base) MCG/ACT inhaler INHALE 2 PUFFS INTO THE LUNGS EVERY 6 HOURS AS NEEDED FOR WHEEZING OR SHORTNESS OF BREATH (Patient taking differently: Inhale 2 puffs into the lungs every 6 (six) hours as needed for wheezing or shortness of breath. ) 8.5 g 0  . amLODipine (NORVASC) 10 MG tablet TAKE 1 TABLET(10 MG) BY MOUTH DAILY 90 tablet 0  . aspirin EC 81 MG tablet Take 81 mg by mouth daily.    Marland Kitchen atorvastatin (LIPITOR) 40 MG tablet TAKE 1 TABLET BY MOUTH DAILY 30 tablet 0  . HYDROcodone-acetaminophen (HYCET) 7.5-325 mg/15 ml solution Take 15 mLs by mouth 4 (four) times daily as needed. 473 mL 0  . omeprazole (PRILOSEC) 40 MG capsule TAKE 1 CAPSULE(40 MG) BY MOUTH DAILY 90 capsule 1  . prochlorperazine (COMPAZINE) 10 MG tablet Take 1 tablet (10 mg total) by mouth every 6 (six) hours as needed for nausea or vomiting. 30 tablet 0  . sucralfate (CARAFATE) 1 g tablet TAKE 1 TABLET BY MOUTH 1 HOUR BEFORE MEALS AND AT BEDTIME AS NEEDED (Patient taking differently: Take 1 g by mouth 4 (four) times daily as needed (stomach acid). ) 120 tablet prn   No current facility-administered medications for this visit.     Physical Findings: Unable to perform due to telephone visit format.  Lab Findings: Lab Results  Component Value Date   WBC 2.8 (L) 08/31/2018   HGB  11.7 (L) 08/31/2018   HCT 36.2 08/31/2018   MCV 90.0 08/31/2018   PLT 234 08/31/2018     Radiographic Findings: No results found.  Impression/Plan: 1. 73 y.o. woman with stage IIIB, pT4N2 NSCLC, squamous cell carcinoma of the RUL. She appears to have recovered well from the effects of her recent radiotherapy.  We discussed that while we are happy to continue to participate in her care if clinically indicated, at this point, we will plan to see her back on an as-needed basis.  She will proceed as scheduled with repeat CT chest on 09/25/2018 and a follow-up visit with Dr. Earlie Server  thereafter on 10/06/2018.  She appears to have a good understanding of her overall disease and our recommendations and is comfortable with the stated plan.  She knows to call at anytime with any questions or concerns related to her previous radiotherapy.      Nicholos Johns, PA-C

## 2018-09-25 ENCOUNTER — Inpatient Hospital Stay: Payer: BLUE CROSS/BLUE SHIELD | Attending: Internal Medicine

## 2018-09-25 ENCOUNTER — Ambulatory Visit (HOSPITAL_COMMUNITY)
Admission: RE | Admit: 2018-09-25 | Discharge: 2018-09-25 | Disposition: A | Discharge status: Home / Self Care | Payer: BLUE CROSS/BLUE SHIELD | Source: Ambulatory Visit | Attending: Internal Medicine | Admitting: Internal Medicine

## 2018-09-25 ENCOUNTER — Encounter (HOSPITAL_COMMUNITY): Payer: Self-pay

## 2018-09-25 ENCOUNTER — Other Ambulatory Visit: Payer: Self-pay

## 2018-09-25 DIAGNOSIS — C3490 Malignant neoplasm of unspecified part of unspecified bronchus or lung: Secondary | ICD-10-CM

## 2018-09-25 DIAGNOSIS — R590 Localized enlarged lymph nodes: Secondary | ICD-10-CM | POA: Diagnosis not present

## 2018-09-25 DIAGNOSIS — Z923 Personal history of irradiation: Secondary | ICD-10-CM | POA: Diagnosis not present

## 2018-09-25 DIAGNOSIS — Z9221 Personal history of antineoplastic chemotherapy: Secondary | ICD-10-CM | POA: Insufficient documentation

## 2018-09-25 DIAGNOSIS — C3411 Malignant neoplasm of upper lobe, right bronchus or lung: Secondary | ICD-10-CM | POA: Diagnosis not present

## 2018-09-25 LAB — CBC WITH DIFFERENTIAL (CANCER CENTER ONLY)
Abs Immature Granulocytes: 0.02 10*3/uL (ref 0.00–0.07)
Basophils Absolute: 0 10*3/uL (ref 0.0–0.1)
Basophils Relative: 1 %
Eosinophils Absolute: 0.1 10*3/uL (ref 0.0–0.5)
Eosinophils Relative: 2 %
HCT: 38.3 % (ref 36.0–46.0)
Hemoglobin: 12.2 g/dL (ref 12.0–15.0)
Immature Granulocytes: 1 %
Lymphocytes Relative: 22 %
Lymphs Abs: 0.8 10*3/uL (ref 0.7–4.0)
MCH: 29.5 pg (ref 26.0–34.0)
MCHC: 31.9 g/dL (ref 30.0–36.0)
MCV: 92.7 fL (ref 80.0–100.0)
Monocytes Absolute: 0.5 10*3/uL (ref 0.1–1.0)
Monocytes Relative: 14 %
Neutro Abs: 2.2 10*3/uL (ref 1.7–7.7)
Neutrophils Relative %: 60 %
Platelet Count: 330 10*3/uL (ref 150–400)
RBC: 4.13 MIL/uL (ref 3.87–5.11)
RDW: 20.6 % — ABNORMAL HIGH (ref 11.5–15.5)
WBC Count: 3.7 10*3/uL — ABNORMAL LOW (ref 4.0–10.5)
nRBC: 0 % (ref 0.0–0.2)

## 2018-09-25 LAB — CMP (CANCER CENTER ONLY)
ALT: 14 U/L (ref 0–44)
AST: 17 U/L (ref 15–41)
Albumin: 3.5 g/dL (ref 3.5–5.0)
Alkaline Phosphatase: 83 U/L (ref 38–126)
Anion gap: 11 (ref 5–15)
BUN: 13 mg/dL (ref 8–23)
CO2: 24 mmol/L (ref 22–32)
Calcium: 9 mg/dL (ref 8.9–10.3)
Chloride: 105 mmol/L (ref 98–111)
Creatinine: 0.9 mg/dL (ref 0.44–1.00)
GFR, Est AFR Am: >60 mL/min (ref >60)
GFR, Estimated: >60 mL/min (ref >60)
Glucose, Bld: 82 mg/dL (ref 70–99)
Potassium: 4.2 mmol/L (ref 3.5–5.1)
Sodium: 140 mmol/L (ref 135–145)
Total Bilirubin: 0.4 mg/dL (ref 0.3–1.2)
Total Protein: 7.3 g/dL (ref 6.5–8.1)

## 2018-09-25 MED ORDER — IOHEXOL 300 MG/ML  SOLN
75.0000 mL | Freq: Once | INTRAMUSCULAR | Status: AC | PRN
  Administered 2018-09-25: 75 mL via INTRAVENOUS

## 2018-09-25 MED ORDER — SODIUM CHLORIDE (PF) 0.9 % IJ SOLN
INTRAMUSCULAR | Status: AC
  Filled 2018-09-25: qty 50

## 2018-10-06 ENCOUNTER — Inpatient Hospital Stay (HOSPITAL_BASED_OUTPATIENT_CLINIC_OR_DEPARTMENT_OTHER): Payer: BLUE CROSS/BLUE SHIELD | Admitting: Internal Medicine

## 2018-10-06 ENCOUNTER — Other Ambulatory Visit: Payer: Self-pay

## 2018-10-06 ENCOUNTER — Encounter: Payer: Self-pay | Admitting: Internal Medicine

## 2018-10-06 VITALS — BP 126/89 | HR 114 | Temp 98.6°F | Resp 20 | Ht 67.0 in | Wt 171.9 lb

## 2018-10-06 DIAGNOSIS — C3411 Malignant neoplasm of upper lobe, right bronchus or lung: Secondary | ICD-10-CM | POA: Diagnosis not present

## 2018-10-06 DIAGNOSIS — C3491 Malignant neoplasm of unspecified part of right bronchus or lung: Secondary | ICD-10-CM

## 2018-10-06 DIAGNOSIS — Z5112 Encounter for antineoplastic immunotherapy: Secondary | ICD-10-CM

## 2018-10-06 DIAGNOSIS — Z7189 Other specified counseling: Secondary | ICD-10-CM

## 2018-10-06 DIAGNOSIS — Z923 Personal history of irradiation: Secondary | ICD-10-CM | POA: Diagnosis not present

## 2018-10-06 DIAGNOSIS — Z9221 Personal history of antineoplastic chemotherapy: Secondary | ICD-10-CM

## 2018-10-06 DIAGNOSIS — R590 Localized enlarged lymph nodes: Secondary | ICD-10-CM

## 2018-10-06 NOTE — Progress Notes (Signed)
[facility] [phone]   [phone]  OFFICE PROGRESS NOTE  [name], MD [address] [facility] [place] [address]  DIAGNOSIS: stage IIIB (T4, N2, M0) non-small cell lung cancer, poorly differentiated squamous cell carcinoma.  She presented with large right upper lobe lung mass in addition to right hilar and mediastinal lymphadenopathy diagnosed in January 2020.  PRIOR THERAPY: Concurrent chemoradiation with weekly carboplatin for AUC of 2 and paclitaxel 45 mg/M2.  First dose July 13, 2018.  Status post 8 cycles.  Last dose was given August 31, 2018.  CURRENT THERAPY: Consolidation treatment with immunotherapy with Imfinzi 10 mg/KG every 2 weeks.  First dose starts October 13, 2018  INTERVAL HISTORY: Christina [name] 73 y.o. female returns to the clinic today for follow-up visit.  The patient is feeling fine today with no concerning complaints.  She denied having any chest pain, shortness of breath, cough or hemoptysis.  She denied having any fever or chills.  She has no nausea, vomiting, diarrhea or constipation.  She tolerated the previous course of concurrent chemoradiation fairly well.  She had repeat CT scan of the chest performed recently and she is here for evaluation and discussion of her scan results.  MEDICAL HISTORY: Past Medical History:  Diagnosis Date   Allergy    Cancer ([place])    lung cancer   GERD (gastroesophageal reflux disease)    Hyperlipidemia    Hypertension    Reflux    Tubulovillous adenoma of colon 2003    ALLERGIES:  is allergic to coconut oil; codeine; shrimp [shellfish allergy]; and trandolapril.  MEDICATIONS:  Current Outpatient Medications  Medication Sig Dispense Refill   albuterol (PROAIR HFA) 108 (90 Base) MCG/ACT inhaler INHALE 2 PUFFS INTO THE LUNGS EVERY 6 HOURS AS NEEDED FOR WHEEZING OR SHORTNESS OF BREATH (Patient taking differently: Inhale 2 puffs into the lungs every  6 (six) hours as needed for wheezing or shortness of breath. ) 8.5 g 0   amLODipine (NORVASC) 10 MG tablet TAKE 1 TABLET(10 MG) BY MOUTH DAILY 90 tablet 0   aspirin EC 81 MG tablet Take 81 mg by mouth daily.     atorvastatin (LIPITOR) 40 MG tablet TAKE 1 TABLET BY MOUTH DAILY 30 tablet 0   HYDROcodone-acetaminophen (HYCET) 7.5-325 mg/15 ml solution Take 15 mLs by mouth 4 (four) times daily as needed. 473 mL 0   omeprazole (PRILOSEC) 40 MG capsule TAKE 1 CAPSULE(40 MG) BY MOUTH DAILY 90 capsule 1   prochlorperazine (COMPAZINE) 10 MG tablet Take 1 tablet (10 mg total) by mouth every 6 (six) hours as needed for nausea or vomiting. 30 tablet 0   sucralfate (CARAFATE) 1 g tablet TAKE 1 TABLET BY MOUTH 1 HOUR BEFORE MEALS AND AT BEDTIME AS NEEDED (Patient taking differently: Take 1 g by mouth 4 (four) times daily as needed (stomach acid). ) 120 tablet prn   No current facility-administered medications for this visit.     SURGICAL HISTORY:  Past Surgical History:  Procedure Laterality Date   CESAREAN SECTION     1 time   COLONOSCOPY     VIDEO BRONCHOSCOPY WITH ENDOBRONCHIAL NAVIGATION N/A 06/24/2018   Procedure: VIDEO BRONCHOSCOPY WITH ENDOBRONCHIAL NAVIGATION with biopsies, right upper lung lobe;  Surgeon: Melrose Nakayama, MD;  Location: [facility] OR;  Service: Thoracic;  Laterality: N/A;   VIDEO BRONCHOSCOPY WITH ENDOBRONCHIAL ULTRASOUND N/A 06/24/2018   Procedure: VIDEO BRONCHOSCOPY WITH ENDOBRONCHIAL ULTRASOUND, right upper  lung lobe;  Surgeon: Melrose Nakayama, MD;  Location: [facility] OR;  Service: Thoracic;  Laterality: N/A;    REVIEW OF SYSTEMS:  Constitutional: negative Eyes: negative Ears, nose, mouth, throat, and face: negative Respiratory: negative Cardiovascular: negative Gastrointestinal: negative Genitourinary:negative Integument/breast: negative Hematologic/lymphatic: negative Musculoskeletal:negative Neurological: negative Behavioral/Psych: negative Endocrine:  negative Allergic/Immunologic: negative   PHYSICAL EXAMINATION: General appearance: alert, cooperative and no distress Head: Normocephalic, without obvious abnormality, atraumatic Neck: no adenopathy, no JVD, supple, symmetrical, trachea midline and thyroid not enlarged, symmetric, no tenderness/mass/nodules Lymph nodes: Cervical, supraclavicular, and axillary nodes normal. Resp: clear to auscultation bilaterally Back: symmetric, no curvature. ROM normal. No CVA tenderness. Cardio: regular rate and rhythm, S1, S2 normal, no murmur, click, rub or gallop GI: soft, non-tender; bowel sounds normal; no masses,  no organomegaly Extremities: extremities normal, atraumatic, no cyanosis or edema Neurologic: Alert and oriented X 3, normal strength and tone. Normal symmetric reflexes. Normal coordination and gait  ECOG PERFORMANCE STATUS: 1 - Symptomatic but completely ambulatory  Blood pressure 126/89, pulse (!) 114, temperature 98.6 F (37 C), temperature source Oral, resp. rate 20, height 5\' 7"  (1.702 m), weight 171 lb 14.4 oz (78 kg), SpO2 100 %.  LABORATORY DATA: Lab Results  Component Value Date   WBC 3.7 (L) 09/25/2018   HGB 12.2 09/25/2018   HCT 38.3 09/25/2018   MCV 92.7 09/25/2018   PLT 330 09/25/2018      Chemistry      Component Value Date/Time   NA 140 09/25/2018 1047   NA 144 04/02/2017 1435   K 4.2 09/25/2018 1047   CL 105 09/25/2018 1047   CO2 24 09/25/2018 1047   BUN 13 09/25/2018 1047   BUN 11 04/02/2017 1435   CREATININE 0.90 09/25/2018 1047   CREATININE 0.87 03/22/2016 0908      Component Value Date/Time   CALCIUM 9.0 09/25/2018 1047   ALKPHOS 83 09/25/2018 1047   AST 17 09/25/2018 1047   ALT 14 09/25/2018 1047   BILITOT 0.4 09/25/2018 1047       RADIOGRAPHIC STUDIES: Ct Chest W Contrast  Result Date: 09/27/2018 CLINICAL DATA:  Non-small-cell lung cancer diagnosed in December 2019. Post chemotherapy and radiation therapy. EXAM: CT CHEST WITH CONTRAST  TECHNIQUE: Multidetector CT imaging of the chest was performed during intravenous contrast administration. CONTRAST:  69mL OMNIPAQUE IOHEXOL 300 MG/ML  SOLN COMPARISON:  CT 06/02/2018.  PET-CT 07/02/2018. FINDINGS: Cardiovascular: Atherosclerosis of the aorta, great vessels and coronary arteries. No acute vascular findings. Stable trace pericardial fluid. The heart size is normal. Mediastinum/Nodes: Interval significant improvement in previously demonstrated mediastinal and right hilar adenopathy. A remaining right hilar node measures 2.0 x 1.3 cm on image 70/2 (on the order of 3.5 x 2.5 cm on PET-CT). There are no residual enlarged mediastinal lymph nodes. Complex left thyroid nodule measuring up to 2.6 cm on image number 7/2 is stable, not hypermetabolic on PET-CT. The trachea and esophagus appear normal. Lungs/Pleura: There is no pleural effusion or pneumothorax. There has been significant interval decrease in size and partial cavitation of the dominant right upper lobe mass which now measures 4.4 x 3.3 cm on image 58/7 (approximately 9.9 x 5.6 cm on PET-CT). This mass abuts and tents the minor fissure. Mild surrounding ground-glass density is likely due to interval therapy. There are no new or enlarging pulmonary nodules. Mild emphysema and central airway thickening noted. Upper abdomen: The visualized upper abdomen appears stable without suspicious findings. There is mild thickening of both adrenal glands, not  hypermetabolic on PET-CT. Musculoskeletal/Chest wall: There is no chest wall mass or suspicious osseous finding. IMPRESSION: 1. Interval significant response to therapy in the right upper lobe mass, mediastinal and right hilar adenopathy. The right upper lobe mass is partially cavitated. 2. No disease progression. 3. Stable left thyroid nodule, coronary artery atherosclerosis, Aortic Atherosclerosis (ICD10-I70.0) and Emphysema (ICD10-J43.9). Electronically Signed   By: Richardean Sale M.D.   On:  09/27/2018 17:15    ASSESSMENT AND PLAN: This is a very pleasant 73 years old African-American female recently diagnosed with a stage IIIB (T4, N2, M0) non-small cell lung cancer, poorly differentiated squamous cell carcinoma presented with right upper lobe lung mass in addition to right hilar and mediastinal lymphadenopathy. The patient underwent concurrent chemoradiation with weekly carboplatin and paclitaxel status post 8 cycles. She tolerated this course of treatment fairly well. The patient had repeat CT scan of the chest performed recently.  I personally and independently reviewed the scans and discussed the results with the patient today.  I showed her the images. Her scan showed significant improvement in her disease. I discussed with the patient the next step in her treatment I recommended for her consolidation treatment with immunotherapy with Imfinzi 10 mg/KG every 2 weeks.  I discussed with the patient the adverse effect of this treatment including but not limited to immunotherapy mediated skin rash, diarrhea, inflammation of the lung, kidney, liver, thyroid or other endocrine dysfunction. The patient is interested in the treatment and she is expected to start the first cycle of this treatment next week. She will come back for follow-up visit in 3 weeks for evaluation before starting cycle #2. She was advised to call immediately if she has any concerning symptoms in the interim The patient voices understanding of current disease status and treatment options and is in agreement with the current care plan.  All questions were answered. The patient knows to call the clinic with any problems, questions or concerns. We can certainly see the patient much sooner if necessary.  Disclaimer: This note was dictated with voice recognition software. Similar sounding words can inadvertently be transcribed and may not be corrected upon review.

## 2018-10-07 ENCOUNTER — Telehealth: Payer: Self-pay | Admitting: Internal Medicine

## 2018-10-07 ENCOUNTER — Ambulatory Visit: Payer: Self-pay | Admitting: Urology

## 2018-10-07 NOTE — Progress Notes (Signed)
DISCONTINUE ON PATHWAY REGIMEN - Non-Small Cell Lung     Administer weekly:     Paclitaxel      Carboplatin   **Always confirm dose/schedule in your pharmacy ordering system**  REASON: Other Reason PRIOR TREATMENT: [id number]: Carboplatin AUC=2 + Paclitaxel 45 mg/m2 Weekly During Radiation TREATMENT RESPONSE: N/A - Adjuvant Therapy  START ON PATHWAY REGIMEN - Non-Small Cell Lung     A cycle is every 14 days:     Durvalumab   **Always confirm dose/schedule in your pharmacy ordering system**  Patient Characteristics: Stage III - Unresectable, PS = 0, 1 AJCC T Category: T4 Current Disease Status: No Distant Mets or Local Recurrence AJCC N Category: N2 AJCC M Category: M0 AJCC 8 Stage Grouping: IIIB ECOG Performance Status: 0 Intent of Therapy: Curative Intent, Discussed with Patient

## 2018-10-07 NOTE — Telephone Encounter (Signed)
Tried to reach regarding schedule told to request printout on next appt

## 2018-10-07 NOTE — Progress Notes (Addendum)
ALERT: Recent Pathways Treatment decision is outdated. Please await next Pathways decision 

## 2018-10-08 ENCOUNTER — Ambulatory Visit: Payer: BLUE CROSS/BLUE SHIELD | Admitting: Urology

## 2018-10-13 ENCOUNTER — Inpatient Hospital Stay: Payer: BLUE CROSS/BLUE SHIELD

## 2018-10-13 ENCOUNTER — Other Ambulatory Visit: Payer: Self-pay

## 2018-10-13 VITALS — BP 125/89 | HR 96 | Temp 98.1°F | Resp 16

## 2018-10-13 DIAGNOSIS — C3491 Malignant neoplasm of unspecified part of right bronchus or lung: Secondary | ICD-10-CM

## 2018-10-13 DIAGNOSIS — C3411 Malignant neoplasm of upper lobe, right bronchus or lung: Secondary | ICD-10-CM | POA: Diagnosis not present

## 2018-10-13 DIAGNOSIS — R590 Localized enlarged lymph nodes: Secondary | ICD-10-CM | POA: Diagnosis not present

## 2018-10-13 DIAGNOSIS — Z923 Personal history of irradiation: Secondary | ICD-10-CM | POA: Diagnosis not present

## 2018-10-13 DIAGNOSIS — Z9221 Personal history of antineoplastic chemotherapy: Secondary | ICD-10-CM | POA: Diagnosis not present

## 2018-10-13 LAB — CMP (CANCER CENTER ONLY)
ALT: 8 U/L (ref 0–44)
AST: 11 U/L — ABNORMAL LOW (ref 15–41)
Albumin: 3.1 g/dL — ABNORMAL LOW (ref 3.5–5.0)
Alkaline Phosphatase: 98 U/L (ref 38–126)
Anion gap: 9 (ref 5–15)
BUN: 12 mg/dL (ref 8–23)
CO2: 28 mmol/L (ref 22–32)
Calcium: 9.2 mg/dL (ref 8.9–10.3)
Chloride: 106 mmol/L (ref 98–111)
Creatinine: 0.85 mg/dL (ref 0.44–1.00)
GFR, Est AFR Am: >60 mL/min (ref >60)
GFR, Estimated: >60 mL/min (ref >60)
Glucose, Bld: 87 mg/dL (ref 70–99)
Potassium: 3.5 mmol/L (ref 3.5–5.1)
Sodium: 143 mmol/L (ref 135–145)
Total Bilirubin: 0.3 mg/dL (ref 0.3–1.2)
Total Protein: 7.3 g/dL (ref 6.5–8.1)

## 2018-10-13 LAB — CBC WITH DIFFERENTIAL (CANCER CENTER ONLY)
Abs Immature Granulocytes: 0.04 10*3/uL (ref 0.00–0.07)
Basophils Absolute: 0.1 10*3/uL (ref 0.0–0.1)
Basophils Relative: 1 %
Eosinophils Absolute: 0.2 10*3/uL (ref 0.0–0.5)
Eosinophils Relative: 4 %
HCT: 37.3 % (ref 36.0–46.0)
Hemoglobin: 12.1 g/dL (ref 12.0–15.0)
Immature Granulocytes: 1 %
Lymphocytes Relative: 18 %
Lymphs Abs: 0.9 10*3/uL (ref 0.7–4.0)
MCH: 29.3 pg (ref 26.0–34.0)
MCHC: 32.4 g/dL (ref 30.0–36.0)
MCV: 90.3 fL (ref 80.0–100.0)
Monocytes Absolute: 0.6 10*3/uL (ref 0.1–1.0)
Monocytes Relative: 11 %
Neutro Abs: 3.2 10*3/uL (ref 1.7–7.7)
Neutrophils Relative %: 65 %
Platelet Count: 404 10*3/uL — ABNORMAL HIGH (ref 150–400)
RBC: 4.13 MIL/uL (ref 3.87–5.11)
RDW: 18.2 % — ABNORMAL HIGH (ref 11.5–15.5)
WBC Count: 5 10*3/uL (ref 4.0–10.5)
nRBC: 0 % (ref 0.0–0.2)

## 2018-10-13 LAB — TSH: TSH: 0.687 u[IU]/mL (ref 0.308–3.960)

## 2018-10-13 MED ORDER — SODIUM CHLORIDE 0.9 % IV SOLN
9.5000 mg/kg | Freq: Once | INTRAVENOUS | Status: AC
  Administered 2018-10-13: 10:00:00 740 mg via INTRAVENOUS
  Filled 2018-10-13: qty 10

## 2018-10-13 MED ORDER — SODIUM CHLORIDE 0.9 % IV SOLN
Freq: Once | INTRAVENOUS | Status: AC
  Administered 2018-10-13: 09:00:00 via INTRAVENOUS
  Filled 2018-10-13: qty 250

## 2018-10-13 MED ORDER — SODIUM CHLORIDE 0.9% FLUSH
10.0000 mL | INTRAVENOUS | Status: DC | PRN
  Filled 2018-10-13: qty 10

## 2018-10-13 NOTE — Patient Instructions (Signed)
[facility] Discharge Instructions for Patients Receiving Chemotherapy  Today you received the following chemotherapy agents:  Imfinzi.  To help prevent nausea and vomiting after your treatment, we encourage you to take your nausea medication as directed.   If you develop nausea and vomiting that is not controlled by your nausea medication, call the clinic.   BELOW ARE SYMPTOMS THAT SHOULD BE REPORTED IMMEDIATELY:  *FEVER GREATER THAN 100.5 F  *CHILLS WITH OR WITHOUT FEVER  NAUSEA AND VOMITING THAT IS NOT CONTROLLED WITH YOUR NAUSEA MEDICATION  *UNUSUAL SHORTNESS OF BREATH  *UNUSUAL BRUISING OR BLEEDING  TENDERNESS IN MOUTH AND THROAT WITH OR WITHOUT PRESENCE OF ULCERS  *URINARY PROBLEMS  *BOWEL PROBLEMS  UNUSUAL RASH Items with * indicate a potential emergency and should be followed up as soon as possible.  Feel free to call the clinic should you have any questions or concerns. The clinic phone number is [phone].  Please show the [facility] at check-in to the Emergency Department and triage nurse.  Durvalumab injection What is this medicine? DURVALUMAB (dur VAL ue mab) is a monoclonal antibody. It is used to treat urothelial cancer and lung cancer. This medicine may be used for other purposes; ask your health care provider or pharmacist if you have questions. COMMON BRAND NAME(S): IMFINZI What should I tell my health care provider before I take this medicine? They need to know if you have any of these conditions: -diabetes -immune system problems -infection -inflammatory bowel disease -kidney disease -liver disease -lung or breathing disease -lupus -organ transplant -stomach or intestine problems -thyroid disease -an unusual or allergic reaction to durvalumab, other medicines, foods, dyes, or preservatives -pregnant or trying to get pregnant -breast-feeding How should I use this medicine? This medicine is for infusion into a vein. It  is given by a health care professional in a hospital or clinic setting. A special MedGuide will be given to you before each treatment. Be sure to read this information carefully each time. Talk to your pediatrician regarding the use of this medicine in children. Special care may be needed. Overdosage: If you think you have taken too much of this medicine contact a poison control center or emergency room at once. NOTE: This medicine is only for you. Do not share this medicine with others. What if I miss a dose? It is important not to miss your dose. Call your doctor or health care professional if you are unable to keep an appointment. What may interact with this medicine? Interactions have not been studied. This list may not describe all possible interactions. Give your health care provider a list of all the medicines, herbs, non-prescription drugs, or dietary supplements you use. Also tell them if you smoke, drink alcohol, or use illegal drugs. Some items may interact with your medicine. What should I watch for while using this medicine? This drug may make you feel generally unwell. Continue your course of treatment even though you feel ill unless your doctor tells you to stop. You may need blood work done while you are taking this medicine. Do not become pregnant while taking this medicine or for 3 months after stopping it. Women should inform their doctor if they wish to become pregnant or think they might be pregnant. There is a potential for serious side effects to an unborn child. Talk to your health care professional or pharmacist for more information. Do not breast-feed an infant while taking this medicine or for 3 months after stopping it.  What side effects may I notice from receiving this medicine? Side effects that you should report to your doctor or health care professional as soon as possible: -allergic reactions like skin rash, itching or hives, swelling of the face, lips, or  tongue -black, tarry stools -bloody or watery diarrhea -breathing problems -change in emotions or moods -change in sex drive -changes in vision -chest pain or chest tightness -chills -confusion -cough -facial flushing -fever -headache -signs and symptoms of high blood sugar such as dizziness; dry mouth; dry skin; fruity breath; nausea; stomach pain; increased hunger or thirst; increased urination -signs and symptoms of liver injury like dark yellow or brown urine; general ill feeling or flu-like symptoms; light-colored stools; loss of appetite; nausea; right upper belly pain; unusually weak or tired; yellowing of the eyes or skin -stomach pain -trouble passing urine or change in the amount of urine -weight gain or weight loss Side effects that usually do not require medical attention (report these to your doctor or health care professional if they continue or are bothersome): -bone pain -constipation -loss of appetite -muscle pain -nausea -swelling of the ankles, feet, hands -tiredness This list may not describe all possible side effects. Call your doctor for medical advice about side effects. You may report side effects to FDA at 1-800-FDA-1088. Where should I keep my medicine? This drug is given in a hospital or clinic and will not be stored at home. NOTE: This sheet is a summary. It may not cover all possible information. If you have questions about this medicine, talk to your doctor, pharmacist, or health care provider.  2019 Elsevier/Gold Standard (2016-08-13 19:25:04)

## 2018-10-15 ENCOUNTER — Telehealth: Payer: Self-pay | Admitting: Medical Oncology

## 2018-10-15 NOTE — Telephone Encounter (Signed)
Doing well after imfinzi. No concerns voiced.

## 2018-10-28 ENCOUNTER — Other Ambulatory Visit: Payer: Self-pay

## 2018-10-28 ENCOUNTER — Encounter: Payer: Self-pay | Admitting: Internal Medicine

## 2018-10-28 ENCOUNTER — Inpatient Hospital Stay: Payer: BLUE CROSS/BLUE SHIELD

## 2018-10-28 ENCOUNTER — Inpatient Hospital Stay (HOSPITAL_BASED_OUTPATIENT_CLINIC_OR_DEPARTMENT_OTHER): Payer: BLUE CROSS/BLUE SHIELD | Admitting: Internal Medicine

## 2018-10-28 ENCOUNTER — Inpatient Hospital Stay: Payer: BLUE CROSS/BLUE SHIELD | Attending: Internal Medicine

## 2018-10-28 VITALS — BP 139/93 | HR 84 | Temp 97.6°F | Resp 20 | Ht 67.0 in | Wt 174.9 lb

## 2018-10-28 VITALS — BP 134/85

## 2018-10-28 DIAGNOSIS — Z923 Personal history of irradiation: Secondary | ICD-10-CM | POA: Diagnosis not present

## 2018-10-28 DIAGNOSIS — Z79899 Other long term (current) drug therapy: Secondary | ICD-10-CM | POA: Diagnosis not present

## 2018-10-28 DIAGNOSIS — Z5112 Encounter for antineoplastic immunotherapy: Secondary | ICD-10-CM | POA: Insufficient documentation

## 2018-10-28 DIAGNOSIS — K219 Gastro-esophageal reflux disease without esophagitis: Secondary | ICD-10-CM | POA: Diagnosis not present

## 2018-10-28 DIAGNOSIS — C3411 Malignant neoplasm of upper lobe, right bronchus or lung: Secondary | ICD-10-CM | POA: Insufficient documentation

## 2018-10-28 DIAGNOSIS — I10 Essential (primary) hypertension: Secondary | ICD-10-CM | POA: Insufficient documentation

## 2018-10-28 DIAGNOSIS — E785 Hyperlipidemia, unspecified: Secondary | ICD-10-CM | POA: Diagnosis not present

## 2018-10-28 DIAGNOSIS — R590 Localized enlarged lymph nodes: Secondary | ICD-10-CM

## 2018-10-28 DIAGNOSIS — Z7982 Long term (current) use of aspirin: Secondary | ICD-10-CM | POA: Insufficient documentation

## 2018-10-28 DIAGNOSIS — Z9221 Personal history of antineoplastic chemotherapy: Secondary | ICD-10-CM | POA: Diagnosis not present

## 2018-10-28 DIAGNOSIS — C3491 Malignant neoplasm of unspecified part of right bronchus or lung: Secondary | ICD-10-CM

## 2018-10-28 LAB — CBC WITH DIFFERENTIAL (CANCER CENTER ONLY)
Abs Immature Granulocytes: 0.02 10*3/uL (ref 0.00–0.07)
Basophils Absolute: 0.1 10*3/uL (ref 0.0–0.1)
Basophils Relative: 1 %
Eosinophils Absolute: 0.1 10*3/uL (ref 0.0–0.5)
Eosinophils Relative: 2 %
HCT: 37.1 % (ref 36.0–46.0)
Hemoglobin: 11.9 g/dL — ABNORMAL LOW (ref 12.0–15.0)
Immature Granulocytes: 0 %
Lymphocytes Relative: 17 %
Lymphs Abs: 0.8 10*3/uL (ref 0.7–4.0)
MCH: 29.4 pg (ref 26.0–34.0)
MCHC: 32.1 g/dL (ref 30.0–36.0)
MCV: 91.6 fL (ref 80.0–100.0)
Monocytes Absolute: 0.5 10*3/uL (ref 0.1–1.0)
Monocytes Relative: 11 %
Neutro Abs: 3.4 10*3/uL (ref 1.7–7.7)
Neutrophils Relative %: 69 %
Platelet Count: 277 10*3/uL (ref 150–400)
RBC: 4.05 MIL/uL (ref 3.87–5.11)
RDW: 16.8 % — ABNORMAL HIGH (ref 11.5–15.5)
WBC Count: 4.9 10*3/uL (ref 4.0–10.5)
nRBC: 0 % (ref 0.0–0.2)

## 2018-10-28 LAB — CMP (CANCER CENTER ONLY)
ALT: 12 U/L (ref 0–44)
AST: 14 U/L — ABNORMAL LOW (ref 15–41)
Albumin: 3.3 g/dL — ABNORMAL LOW (ref 3.5–5.0)
Alkaline Phosphatase: 105 U/L (ref 38–126)
Anion gap: 10 (ref 5–15)
BUN: 12 mg/dL (ref 8–23)
CO2: 27 mmol/L (ref 22–32)
Calcium: 9.2 mg/dL (ref 8.9–10.3)
Chloride: 103 mmol/L (ref 98–111)
Creatinine: 0.9 mg/dL (ref 0.44–1.00)
GFR, Est AFR Am: >60 mL/min (ref >60)
GFR, Estimated: >60 mL/min (ref >60)
Glucose, Bld: 87 mg/dL (ref 70–99)
Potassium: 3.3 mmol/L — ABNORMAL LOW (ref 3.5–5.1)
Sodium: 140 mmol/L (ref 135–145)
Total Bilirubin: 0.4 mg/dL (ref 0.3–1.2)
Total Protein: 7.3 g/dL (ref 6.5–8.1)

## 2018-10-28 MED ORDER — SODIUM CHLORIDE 0.9 % IV SOLN
Freq: Once | INTRAVENOUS | Status: AC
  Administered 2018-10-28: 14:00:00 via INTRAVENOUS
  Filled 2018-10-28: qty 250

## 2018-10-28 MED ORDER — SODIUM CHLORIDE 0.9 % IV SOLN
9.5000 mg/kg | Freq: Once | INTRAVENOUS | Status: AC
  Administered 2018-10-28: 740 mg via INTRAVENOUS
  Filled 2018-10-28: qty 4.8

## 2018-10-28 NOTE — Patient Instructions (Signed)
[place] Discharge Instructions for Patients Receiving Chemotherapy  Today you received the following chemotherapy agents:  Imfinzi.  To help prevent nausea and vomiting after your treatment, we encourage you to take your nausea medication as directed.   If you develop nausea and vomiting that is not controlled by your nausea medication, call the clinic.   BELOW ARE SYMPTOMS THAT SHOULD BE REPORTED IMMEDIATELY:  *FEVER GREATER THAN 100.5 F  *CHILLS WITH OR WITHOUT FEVER  NAUSEA AND VOMITING THAT IS NOT CONTROLLED WITH YOUR NAUSEA MEDICATION  *UNUSUAL SHORTNESS OF BREATH  *UNUSUAL BRUISING OR BLEEDING  TENDERNESS IN MOUTH AND THROAT WITH OR WITHOUT PRESENCE OF ULCERS  *URINARY PROBLEMS  *BOWEL PROBLEMS  UNUSUAL RASH Items with * indicate a potential emergency and should be followed up as soon as possible.  Feel free to call the clinic should you have any questions or concerns. The clinic phone number is [phone].  Please show the [place] at check-in to the Emergency Department and triage nurse.  Durvalumab injection What is this medicine? DURVALUMAB (dur VAL ue mab) is a monoclonal antibody. It is used to treat urothelial cancer and lung cancer. This medicine may be used for other purposes; ask your health care provider or pharmacist if you have questions. COMMON BRAND NAME(S): IMFINZI What should I tell my health care provider before I take this medicine? They need to know if you have any of these conditions: -diabetes -immune system problems -infection -inflammatory bowel disease -kidney disease -liver disease -lung or breathing disease -lupus -organ transplant -stomach or intestine problems -thyroid disease -an unusual or allergic reaction to durvalumab, other medicines, foods, dyes, or preservatives -pregnant or trying to get pregnant -breast-feeding How should I use this medicine? This medicine is for infusion into a vein. It  is given by a health care professional in a hospital or clinic setting. A special MedGuide will be given to you before each treatment. Be sure to read this information carefully each time. Talk to your pediatrician regarding the use of this medicine in children. Special care may be needed. Overdosage: If you think you have taken too much of this medicine contact a poison control center or emergency room at once. NOTE: This medicine is only for you. Do not share this medicine with others. What if I miss a dose? It is important not to miss your dose. Call your doctor or health care professional if you are unable to keep an appointment. What may interact with this medicine? Interactions have not been studied. This list may not describe all possible interactions. Give your health care provider a list of all the medicines, herbs, non-prescription drugs, or dietary supplements you use. Also tell them if you smoke, drink alcohol, or use illegal drugs. Some items may interact with your medicine. What should I watch for while using this medicine? This drug may make you feel generally unwell. Continue your course of treatment even though you feel ill unless your doctor tells you to stop. You may need blood work done while you are taking this medicine. Do not become pregnant while taking this medicine or for 3 months after stopping it. Women should inform their doctor if they wish to become pregnant or think they might be pregnant. There is a potential for serious side effects to an unborn child. Talk to your health care professional or pharmacist for more information. Do not breast-feed an infant while taking this medicine or for 3 months after stopping it.  What side effects may I notice from receiving this medicine? Side effects that you should report to your doctor or health care professional as soon as possible: -allergic reactions like skin rash, itching or hives, swelling of the face, lips, or  tongue -black, tarry stools -bloody or watery diarrhea -breathing problems -change in emotions or moods -change in sex drive -changes in vision -chest pain or chest tightness -chills -confusion -cough -facial flushing -fever -headache -signs and symptoms of high blood sugar such as dizziness; dry mouth; dry skin; fruity breath; nausea; stomach pain; increased hunger or thirst; increased urination -signs and symptoms of liver injury like dark yellow or brown urine; general ill feeling or flu-like symptoms; light-colored stools; loss of appetite; nausea; right upper belly pain; unusually weak or tired; yellowing of the eyes or skin -stomach pain -trouble passing urine or change in the amount of urine -weight gain or weight loss Side effects that usually do not require medical attention (report these to your doctor or health care professional if they continue or are bothersome): -bone pain -constipation -loss of appetite -muscle pain -nausea -swelling of the ankles, feet, hands -tiredness This list may not describe all possible side effects. Call your doctor for medical advice about side effects. You may report side effects to FDA at 1-800-FDA-1088. Where should I keep my medicine? This drug is given in a hospital or clinic and will not be stored at home. NOTE: This sheet is a summary. It may not cover all possible information. If you have questions about this medicine, talk to your doctor, pharmacist, or health care provider.  2019 Elsevier/Gold Standard (2016-08-13 19:25:04)

## 2018-10-28 NOTE — Progress Notes (Signed)
[place] [phone]   [phone]  OFFICE PROGRESS NOTE  [name], MD [place] [address] [facility] [place] [address]  DIAGNOSIS: stage IIIB (T4, N2, M0) non-small cell lung cancer, poorly differentiated squamous cell carcinoma.  She presented with large right upper lobe lung mass in addition to right hilar and mediastinal lymphadenopathy diagnosed in January 2020.  PRIOR THERAPY: Concurrent chemoradiation with weekly carboplatin for AUC of 2 and paclitaxel 45 mg/M2.  First dose July 13, 2018.  Status post 8 cycles.  Last dose was given August 31, 2018.  CURRENT THERAPY: Consolidation treatment with immunotherapy with Imfinzi 10 mg/KG every 2 weeks.  First dose starts October 13, 2018.  Status post 1 cycle.  INTERVAL HISTORY: Christina [name] 72 y.o. female returns to the clinic today for follow-up visit.  The patient is feeling fine today with no concerning complaints.  She tolerated the first cycle of her treatment with immunotherapy fairly well.  She denied having any chest pain, shortness of breath, cough or hemoptysis.  She has no nausea, vomiting, diarrhea or constipation.  She has no headache or visual changes.  She denied having any recent weight loss or night sweats.  The patient is here today for evaluation before starting cycle #2.   MEDICAL HISTORY: Past Medical History:  Diagnosis Date  . Allergy   . Cancer ([place])    lung cancer  . GERD (gastroesophageal reflux disease)   . Hyperlipidemia   . Hypertension   . Reflux   . Tubulovillous adenoma of colon 2003    ALLERGIES:  is allergic to coconut oil; codeine; shrimp [shellfish allergy]; and trandolapril.  MEDICATIONS:  Current Outpatient Medications  Medication Sig Dispense Refill  . albuterol (PROAIR HFA) 108 (90 Base) MCG/ACT inhaler INHALE 2 PUFFS INTO THE LUNGS EVERY 6 HOURS AS NEEDED FOR WHEEZING OR SHORTNESS OF BREATH (Patient taking differently:  Inhale 2 puffs into the lungs every 6 (six) hours as needed for wheezing or shortness of breath. ) 8.5 g 0  . amLODipine (NORVASC) 10 MG tablet TAKE 1 TABLET(10 MG) BY MOUTH DAILY 90 tablet 0  . aspirin EC 81 MG tablet Take 81 mg by mouth daily.    Marland Kitchen atorvastatin (LIPITOR) 40 MG tablet TAKE 1 TABLET BY MOUTH DAILY 30 tablet 0  . HYDROcodone-acetaminophen (HYCET) 7.5-325 mg/15 ml solution Take 15 mLs by mouth 4 (four) times daily as needed. 473 mL 0  . omeprazole (PRILOSEC) 40 MG capsule TAKE 1 CAPSULE(40 MG) BY MOUTH DAILY 90 capsule 1  . prochlorperazine (COMPAZINE) 10 MG tablet Take 1 tablet (10 mg total) by mouth every 6 (six) hours as needed for nausea or vomiting. 30 tablet 0  . sucralfate (CARAFATE) 1 g tablet TAKE 1 TABLET BY MOUTH 1 HOUR BEFORE MEALS AND AT BEDTIME AS NEEDED (Patient taking differently: Take 1 g by mouth 4 (four) times daily as needed (stomach acid). ) 120 tablet prn   No current facility-administered medications for this visit.     SURGICAL HISTORY:  Past Surgical History:  Procedure Laterality Date  . CESAREAN SECTION     1 time  . COLONOSCOPY    . VIDEO BRONCHOSCOPY WITH ENDOBRONCHIAL NAVIGATION N/A 06/24/2018   Procedure: VIDEO BRONCHOSCOPY WITH ENDOBRONCHIAL NAVIGATION with biopsies, right upper lung lobe;  Surgeon: Melrose Nakayama, MD;  Location: [facility] OR;  Service: Thoracic;  Laterality: N/A;  . VIDEO BRONCHOSCOPY WITH ENDOBRONCHIAL ULTRASOUND N/A 06/24/2018  Procedure: VIDEO BRONCHOSCOPY WITH ENDOBRONCHIAL ULTRASOUND, right upper lung lobe;  Surgeon: Melrose Nakayama, MD;  Location: [facility] OR;  Service: Thoracic;  Laterality: N/A;    REVIEW OF SYSTEMS:  A comprehensive review of systems was negative.   PHYSICAL EXAMINATION: General appearance: alert, cooperative and no distress Head: Normocephalic, without obvious abnormality, atraumatic Neck: no adenopathy, no JVD, supple, symmetrical, trachea midline and thyroid not enlarged, symmetric, no  tenderness/mass/nodules Lymph nodes: Cervical, supraclavicular, and axillary nodes normal. Resp: clear to auscultation bilaterally Back: symmetric, no curvature. ROM normal. No CVA tenderness. Cardio: regular rate and rhythm, S1, S2 normal, no murmur, click, rub or gallop GI: soft, non-tender; bowel sounds normal; no masses,  no organomegaly Extremities: extremities normal, atraumatic, no cyanosis or edema  ECOG PERFORMANCE STATUS: 1 - Symptomatic but completely ambulatory  Blood pressure (!) 139/93, pulse 84, temperature 97.6 F (36.4 C), temperature source Oral, resp. rate 20, height 5\' 7"  (1.702 m), weight 174 lb 14.4 oz (79.3 kg), SpO2 100 %.  LABORATORY DATA: Lab Results  Component Value Date   WBC 4.9 10/28/2018   HGB 11.9 (L) 10/28/2018   HCT 37.1 10/28/2018   MCV 91.6 10/28/2018   PLT 277 10/28/2018      Chemistry      Component Value Date/Time   NA 143 10/13/2018 0814   NA 144 04/02/2017 1435   K 3.5 10/13/2018 0814   CL 106 10/13/2018 0814   CO2 28 10/13/2018 0814   BUN 12 10/13/2018 0814   BUN 11 04/02/2017 1435   CREATININE 0.85 10/13/2018 0814   CREATININE 0.87 03/22/2016 0908      Component Value Date/Time   CALCIUM 9.2 10/13/2018 0814   ALKPHOS 98 10/13/2018 0814   AST 11 (L) 10/13/2018 0814   ALT 8 10/13/2018 0814   BILITOT 0.3 10/13/2018 0814       RADIOGRAPHIC STUDIES: No results found.  ASSESSMENT AND PLAN: This is a very pleasant 73 years old African-American female recently diagnosed with a stage IIIB (T4, N2, M0) non-small cell lung cancer, poorly differentiated squamous cell carcinoma presented with right upper lobe lung mass in addition to right hilar and mediastinal lymphadenopathy. The patient underwent concurrent chemoradiation with weekly carboplatin and paclitaxel status post 8 cycles. She has partial response to this treatment. The patient was started on consolidation treatment with immunotherapy with Imfinzi 10 mg/KG every 2 weeks  status post 1 cycle. She tolerated the first cycle of this treatment well. I recommended for her to proceed with cycle #2 today as planned. I will see her back for follow-up visit in 2 weeks for evaluation before the next cycle of her treatment. She was advised to call immediately if she has any concerning symptoms in the interval. The patient voices understanding of current disease status and treatment options and is in agreement with the current care plan.  All questions were answered. The patient knows to call the clinic with any problems, questions or concerns. We can certainly see the patient much sooner if necessary.  Disclaimer: This note was dictated with voice recognition software. Similar sounding words can inadvertently be transcribed and may not be corrected upon review.

## 2018-10-29 ENCOUNTER — Telehealth: Payer: Self-pay | Admitting: Internal Medicine

## 2018-10-29 NOTE — Telephone Encounter (Signed)
Scheduled appt per 5/13 los - added additional cycle to what was already scheduled. Pt to get an updated schedule next visit.

## 2018-11-09 ENCOUNTER — Other Ambulatory Visit: Payer: Self-pay | Admitting: Physician Assistant

## 2018-11-09 DIAGNOSIS — I10 Essential (primary) hypertension: Secondary | ICD-10-CM

## 2018-11-11 ENCOUNTER — Encounter: Payer: Self-pay | Admitting: Internal Medicine

## 2018-11-11 ENCOUNTER — Inpatient Hospital Stay: Payer: BLUE CROSS/BLUE SHIELD

## 2018-11-11 ENCOUNTER — Other Ambulatory Visit: Payer: Self-pay

## 2018-11-11 ENCOUNTER — Inpatient Hospital Stay (HOSPITAL_BASED_OUTPATIENT_CLINIC_OR_DEPARTMENT_OTHER): Payer: BLUE CROSS/BLUE SHIELD | Admitting: Internal Medicine

## 2018-11-11 VITALS — BP 124/85 | HR 91 | Temp 98.2°F | Resp 18 | Ht 67.0 in | Wt 172.9 lb

## 2018-11-11 DIAGNOSIS — E785 Hyperlipidemia, unspecified: Secondary | ICD-10-CM | POA: Diagnosis not present

## 2018-11-11 DIAGNOSIS — Z9221 Personal history of antineoplastic chemotherapy: Secondary | ICD-10-CM | POA: Diagnosis not present

## 2018-11-11 DIAGNOSIS — Z79899 Other long term (current) drug therapy: Secondary | ICD-10-CM

## 2018-11-11 DIAGNOSIS — Z923 Personal history of irradiation: Secondary | ICD-10-CM

## 2018-11-11 DIAGNOSIS — K219 Gastro-esophageal reflux disease without esophagitis: Secondary | ICD-10-CM | POA: Diagnosis not present

## 2018-11-11 DIAGNOSIS — C3491 Malignant neoplasm of unspecified part of right bronchus or lung: Secondary | ICD-10-CM

## 2018-11-11 DIAGNOSIS — C3411 Malignant neoplasm of upper lobe, right bronchus or lung: Secondary | ICD-10-CM | POA: Diagnosis not present

## 2018-11-11 DIAGNOSIS — Z5112 Encounter for antineoplastic immunotherapy: Secondary | ICD-10-CM

## 2018-11-11 DIAGNOSIS — R590 Localized enlarged lymph nodes: Secondary | ICD-10-CM

## 2018-11-11 DIAGNOSIS — Z7982 Long term (current) use of aspirin: Secondary | ICD-10-CM | POA: Diagnosis not present

## 2018-11-11 DIAGNOSIS — I10 Essential (primary) hypertension: Secondary | ICD-10-CM | POA: Diagnosis not present

## 2018-11-11 LAB — CMP (CANCER CENTER ONLY)
ALT: 13 U/L (ref 0–44)
AST: 15 U/L (ref 15–41)
Albumin: 3.3 g/dL — ABNORMAL LOW (ref 3.5–5.0)
Alkaline Phosphatase: 86 U/L (ref 38–126)
Anion gap: 9 (ref 5–15)
BUN: 13 mg/dL (ref 8–23)
CO2: 25 mmol/L (ref 22–32)
Calcium: 9.3 mg/dL (ref 8.9–10.3)
Chloride: 108 mmol/L (ref 98–111)
Creatinine: 0.92 mg/dL (ref 0.44–1.00)
GFR, Est AFR Am: >60 mL/min (ref >60)
GFR, Estimated: >60 mL/min (ref >60)
Glucose, Bld: 127 mg/dL — ABNORMAL HIGH (ref 70–99)
Potassium: 3.3 mmol/L — ABNORMAL LOW (ref 3.5–5.1)
Sodium: 142 mmol/L (ref 135–145)
Total Bilirubin: 0.4 mg/dL (ref 0.3–1.2)
Total Protein: 6.9 g/dL (ref 6.5–8.1)

## 2018-11-11 LAB — CBC WITH DIFFERENTIAL (CANCER CENTER ONLY)
Abs Immature Granulocytes: 0.02 10*3/uL (ref 0.00–0.07)
Basophils Absolute: 0.1 10*3/uL (ref 0.0–0.1)
Basophils Relative: 1 %
Eosinophils Absolute: 0.2 10*3/uL (ref 0.0–0.5)
Eosinophils Relative: 4 %
HCT: 38.4 % (ref 36.0–46.0)
Hemoglobin: 12 g/dL (ref 12.0–15.0)
Immature Granulocytes: 1 %
Lymphocytes Relative: 19 %
Lymphs Abs: 0.8 10*3/uL (ref 0.7–4.0)
MCH: 29.6 pg (ref 26.0–34.0)
MCHC: 31.3 g/dL (ref 30.0–36.0)
MCV: 94.8 fL (ref 80.0–100.0)
Monocytes Absolute: 0.3 10*3/uL (ref 0.1–1.0)
Monocytes Relative: 7 %
Neutro Abs: 2.9 10*3/uL (ref 1.7–7.7)
Neutrophils Relative %: 68 %
Platelet Count: 332 10*3/uL (ref 150–400)
RBC: 4.05 MIL/uL (ref 3.87–5.11)
RDW: 15.2 % (ref 11.5–15.5)
WBC Count: 4.2 10*3/uL (ref 4.0–10.5)
nRBC: 0 % (ref 0.0–0.2)

## 2018-11-11 LAB — TSH: TSH: 1.024 u[IU]/mL (ref 0.308–3.960)

## 2018-11-11 MED ORDER — SODIUM CHLORIDE 0.9 % IV SOLN
9.5000 mg/kg | Freq: Once | INTRAVENOUS | Status: AC
  Administered 2018-11-11: 12:00:00 740 mg via INTRAVENOUS
  Filled 2018-11-11: qty 4.8

## 2018-11-11 MED ORDER — SODIUM CHLORIDE 0.9 % IV SOLN
Freq: Once | INTRAVENOUS | Status: AC
  Administered 2018-11-11: 12:00:00 via INTRAVENOUS
  Filled 2018-11-11: qty 250

## 2018-11-11 NOTE — Progress Notes (Signed)
[place] [phone]   [phone]) [phone]  OFFICE PROGRESS NOTE  [name], MD [place] [address] [facility] [place] [address]  DIAGNOSIS: stage IIIB (T4, N2, M0) non-small cell lung cancer, poorly differentiated squamous cell carcinoma.  She presented with large right upper lobe lung mass in addition to right hilar and mediastinal lymphadenopathy diagnosed in January 2020.  PRIOR THERAPY: Concurrent chemoradiation with weekly carboplatin for AUC of 2 and paclitaxel 45 mg/M2.  First dose July 13, 2018.  Status post 8 cycles.  Last dose was given August 31, 2018.  CURRENT THERAPY: Consolidation treatment with immunotherapy with Imfinzi 10 mg/KG every 2 weeks.  First dose starts October 13, 2018.  Status post 2 cycles.  INTERVAL HISTORY: Christina [name] 73 y.o. female returns to the clinic today for follow-up visit.  The patient is feeling fine today with no concerning complaints.  She denied having any current chest pain, shortness of breath, cough or hemoptysis.  She denied having any fever or chills.  She has no nausea, vomiting, diarrhea or constipation.  She denied having any headache or visual changes.  She continues to tolerate her treatment with Imfinzi fairly well.  The patient is here today for evaluation before starting cycle #3.    MEDICAL HISTORY: Past Medical History:  Diagnosis Date  . Allergy   . Cancer ([place])    lung cancer  . GERD (gastroesophageal reflux disease)   . Hyperlipidemia   . Hypertension   . Reflux   . Tubulovillous adenoma of colon 2003    ALLERGIES:  is allergic to coconut oil; codeine; shrimp [shellfish allergy]; and trandolapril.  MEDICATIONS:  Current Outpatient Medications  Medication Sig Dispense Refill  . albuterol (PROAIR HFA) 108 (90 Base) MCG/ACT inhaler INHALE 2 PUFFS INTO THE LUNGS EVERY 6 HOURS AS NEEDED FOR WHEEZING OR SHORTNESS OF BREATH (Patient taking differently: Inhale 2  puffs into the lungs every 6 (six) hours as needed for wheezing or shortness of breath. ) 8.5 g 0  . amLODipine (NORVASC) 10 MG tablet TAKE 1 TABLET(10 MG) BY MOUTH DAILY 90 tablet 0  . aspirin EC 81 MG tablet Take 81 mg by mouth daily.    Marland Kitchen atorvastatin (LIPITOR) 40 MG tablet TAKE 1 TABLET BY MOUTH DAILY 30 tablet 0  . HYDROcodone-acetaminophen (HYCET) 7.5-325 mg/15 ml solution Take 15 mLs by mouth 4 (four) times daily as needed. 473 mL 0  . omeprazole (PRILOSEC) 40 MG capsule TAKE 1 CAPSULE(40 MG) BY MOUTH DAILY 90 capsule 1  . prochlorperazine (COMPAZINE) 10 MG tablet Take 1 tablet (10 mg total) by mouth every 6 (six) hours as needed for nausea or vomiting. 30 tablet 0  . sucralfate (CARAFATE) 1 g tablet TAKE 1 TABLET BY MOUTH 1 HOUR BEFORE MEALS AND AT BEDTIME AS NEEDED (Patient taking differently: Take 1 g by mouth 4 (four) times daily as needed (stomach acid). ) 120 tablet prn   No current facility-administered medications for this visit.     SURGICAL HISTORY:  Past Surgical History:  Procedure Laterality Date  . CESAREAN SECTION     1 time  . COLONOSCOPY    . VIDEO BRONCHOSCOPY WITH ENDOBRONCHIAL NAVIGATION N/A 06/24/2018   Procedure: VIDEO BRONCHOSCOPY WITH ENDOBRONCHIAL NAVIGATION with biopsies, right upper lung lobe;  Surgeon: Melrose Nakayama, MD;  Location: [facility];  Service: Thoracic;  Laterality: N/A;  . VIDEO BRONCHOSCOPY WITH ENDOBRONCHIAL ULTRASOUND N/A 06/24/2018   Procedure: VIDEO  BRONCHOSCOPY WITH ENDOBRONCHIAL ULTRASOUND, right upper lung lobe;  Surgeon: Melrose Nakayama, MD;  Location: [facility] OR;  Service: Thoracic;  Laterality: N/A;    REVIEW OF SYSTEMS:  A comprehensive review of systems was negative.   PHYSICAL EXAMINATION: General appearance: alert, cooperative and no distress Head: Normocephalic, without obvious abnormality, atraumatic Neck: no adenopathy, no JVD, supple, symmetrical, trachea midline and thyroid not enlarged, symmetric, no  tenderness/mass/nodules Lymph nodes: Cervical, supraclavicular, and axillary nodes normal. Resp: clear to auscultation bilaterally Back: symmetric, no curvature. ROM normal. No CVA tenderness. Cardio: regular rate and rhythm, S1, S2 normal, no murmur, click, rub or gallop GI: soft, non-tender; bowel sounds normal; no masses,  no organomegaly Extremities: extremities normal, atraumatic, no cyanosis or edema  ECOG PERFORMANCE STATUS: 1 - Symptomatic but completely ambulatory  Blood pressure 124/85, pulse 91, temperature 98.2 F (36.8 C), temperature source Oral, resp. rate 18, height 5\' 7"  (1.702 m), weight 172 lb 14.4 oz (78.4 kg), SpO2 100 %.  LABORATORY DATA: Lab Results  Component Value Date   WBC 4.2 11/11/2018   HGB 12.0 11/11/2018   HCT 38.4 11/11/2018   MCV 94.8 11/11/2018   PLT 332 11/11/2018      Chemistry      Component Value Date/Time   NA 142 11/11/2018 0945   NA 144 04/02/2017 1435   K 3.3 (L) 11/11/2018 0945   CL 108 11/11/2018 0945   CO2 25 11/11/2018 0945   BUN 13 11/11/2018 0945   BUN 11 04/02/2017 1435   CREATININE 0.92 11/11/2018 0945   CREATININE 0.87 03/22/2016 0908      Component Value Date/Time   CALCIUM 9.3 11/11/2018 0945   ALKPHOS 86 11/11/2018 0945   AST 15 11/11/2018 0945   ALT 13 11/11/2018 0945   BILITOT 0.4 11/11/2018 0945       RADIOGRAPHIC STUDIES: No results found.  ASSESSMENT AND PLAN: This is a very pleasant 73 years old African-American female recently diagnosed with a stage IIIB (T4, N2, M0) non-small cell lung cancer, poorly differentiated squamous cell carcinoma presented with right upper lobe lung mass in addition to right hilar and mediastinal lymphadenopathy. The patient underwent concurrent chemoradiation with weekly carboplatin and paclitaxel status post 8 cycles. She has partial response to this treatment. The patient was started on consolidation treatment with immunotherapy with Imfinzi 10 mg/KG every 2 weeks status  post 2 cycles. The patient continues to tolerate this treatment well with no concerning adverse effects. I recommended for her to proceed with cycle #3 today as planned. I will see her back for follow-up visit in 2 weeks for evaluation before starting cycle #4. The patient was advised to call immediately if she has any concerning symptoms in the interval. The patient voices understanding of current disease status and treatment options and is in agreement with the current care plan.  All questions were answered. The patient knows to call the clinic with any problems, questions or concerns. We can certainly see the patient much sooner if necessary.  Disclaimer: This note was dictated with voice recognition software. Similar sounding words can inadvertently be transcribed and may not be corrected upon review.

## 2018-11-11 NOTE — Patient Instructions (Signed)
[place] Discharge Instructions for Patients Receiving Chemotherapy  Today you received the following chemotherapy agents Durvalumab (IMFINZI).  To help prevent nausea and vomiting after your treatment, we encourage you to take your nausea medication as prescribed.   If you develop nausea and vomiting that is not controlled by your nausea medication, call the clinic.   BELOW ARE SYMPTOMS THAT SHOULD BE REPORTED IMMEDIATELY:  *FEVER GREATER THAN 100.5 F  *CHILLS WITH OR WITHOUT FEVER  NAUSEA AND VOMITING THAT IS NOT CONTROLLED WITH YOUR NAUSEA MEDICATION  *UNUSUAL SHORTNESS OF BREATH  *UNUSUAL BRUISING OR BLEEDING  TENDERNESS IN MOUTH AND THROAT WITH OR WITHOUT PRESENCE OF ULCERS  *URINARY PROBLEMS  *BOWEL PROBLEMS  UNUSUAL RASH Items with * indicate a potential emergency and should be followed up as soon as possible.  Feel free to call the clinic should you have any questions or concerns. The clinic phone number is [phone].  Please show the [place] at check-in to the Emergency Department and triage nurse.  Coronavirus (COVID-19) Are you at risk?  Are you at risk for the Coronavirus (COVID-19)?  To be considered HIGH RISK for Coronavirus (COVID-19), you have to meet the following criteria:  . Traveled to [place], [place], [place], [place] or [place]; or in the [place] to [place], [place], [place], or [place]; and have fever, cough, and shortness of breath within the last 2 weeks of travel OR . Been in close contact with a person diagnosed with COVID-19 within the last 2 weeks and have fever, cough, and shortness of breath . IF YOU DO NOT MEET THESE CRITERIA, YOU ARE CONSIDERED LOW RISK FOR COVID-19.  What to do if you are HIGH RISK for COVID-19?  Marland Kitchen If you are having a medical emergency, call 911. . Seek medical care right away. Before you go to a doctor's office, urgent care or emergency department, call ahead and tell them  about your recent travel, contact with someone diagnosed with COVID-19, and your symptoms. You should receive instructions from your physician's office regarding next steps of care.  . When you arrive at healthcare provider, tell the healthcare staff immediately you have returned from visiting [place], [place], [place], [place] or [place]; or traveled in the [place] to [place], [place], [place], or [place]; in the last two weeks or you have been in close contact with a person diagnosed with COVID-19 in the last 2 weeks.   . Tell the health care staff about your symptoms: fever, cough and shortness of breath. . After you have been seen by a medical provider, you will be either: o Tested for (COVID-19) and discharged home on quarantine except to seek medical care if symptoms worsen, and asked to  - Stay home and avoid contact with others until you get your results (4-5 days)  - Avoid travel on public transportation if possible (such as bus, train, or airplane) or o Sent to the Emergency Department by EMS for evaluation, COVID-19 testing, and possible admission depending on your condition and test results.  What to do if you are LOW RISK for COVID-19?  Reduce your risk of any infection by using the same precautions used for avoiding the common cold or flu:  Marland Kitchen Wash your hands often with soap and warm water for at least 20 seconds.  If soap and water are not readily available, use an alcohol-based hand sanitizer with at least 60% alcohol.  . If coughing or  sneezing, cover your mouth and nose by coughing or sneezing into the elbow areas of your shirt or coat, into a tissue or into your sleeve (not your hands). . Avoid shaking hands with others and consider head nods or verbal greetings only. . Avoid touching your eyes, nose, or mouth with unwashed hands.  . Avoid close contact with people who are sick. . Avoid places or events with large numbers of people in one location, like concerts or  sporting events. . Carefully consider travel plans you have or are making. . If you are planning any travel outside or inside the [place], visit the CDC's Travelers' Health webpage for the latest health notices. . If you have some symptoms but not all symptoms, continue to monitor at home and seek medical attention if your symptoms worsen. . If you are having a medical emergency, call 911.   [place] / e-Visit: [email or web address]         [facility]: [facility]: [phone]                   [facility]: [phone]

## 2018-11-16 ENCOUNTER — Other Ambulatory Visit: Payer: Self-pay | Admitting: Family Medicine

## 2018-11-16 DIAGNOSIS — R062 Wheezing: Secondary | ICD-10-CM

## 2018-11-24 ENCOUNTER — Inpatient Hospital Stay (HOSPITAL_BASED_OUTPATIENT_CLINIC_OR_DEPARTMENT_OTHER): Payer: BC Managed Care – PPO | Admitting: Internal Medicine

## 2018-11-24 ENCOUNTER — Encounter: Payer: Self-pay | Admitting: Internal Medicine

## 2018-11-24 ENCOUNTER — Inpatient Hospital Stay: Payer: BC Managed Care – PPO

## 2018-11-24 ENCOUNTER — Other Ambulatory Visit: Payer: Self-pay

## 2018-11-24 ENCOUNTER — Inpatient Hospital Stay: Payer: BC Managed Care – PPO | Attending: Internal Medicine

## 2018-11-24 ENCOUNTER — Telehealth: Payer: Self-pay | Admitting: Medical Oncology

## 2018-11-24 VITALS — BP 126/90 | HR 87 | Temp 98.7°F | Resp 18 | Ht 67.0 in | Wt 171.5 lb

## 2018-11-24 DIAGNOSIS — Z923 Personal history of irradiation: Secondary | ICD-10-CM | POA: Diagnosis not present

## 2018-11-24 DIAGNOSIS — C3491 Malignant neoplasm of unspecified part of right bronchus or lung: Secondary | ICD-10-CM

## 2018-11-24 DIAGNOSIS — Z9221 Personal history of antineoplastic chemotherapy: Secondary | ICD-10-CM | POA: Diagnosis not present

## 2018-11-24 DIAGNOSIS — Z5112 Encounter for antineoplastic immunotherapy: Secondary | ICD-10-CM | POA: Diagnosis not present

## 2018-11-24 DIAGNOSIS — C3411 Malignant neoplasm of upper lobe, right bronchus or lung: Secondary | ICD-10-CM | POA: Insufficient documentation

## 2018-11-24 DIAGNOSIS — I10 Essential (primary) hypertension: Secondary | ICD-10-CM

## 2018-11-24 LAB — CMP (CANCER CENTER ONLY)
ALT: 11 U/L (ref 0–44)
AST: 15 U/L (ref 15–41)
Albumin: 3.5 g/dL (ref 3.5–5.0)
Alkaline Phosphatase: 93 U/L (ref 38–126)
Anion gap: 9 (ref 5–15)
BUN: 11 mg/dL (ref 8–23)
CO2: 27 mmol/L (ref 22–32)
Calcium: 9 mg/dL (ref 8.9–10.3)
Chloride: 105 mmol/L (ref 98–111)
Creatinine: 0.93 mg/dL (ref 0.44–1.00)
GFR, Est AFR Am: >60 mL/min (ref >60)
GFR, Estimated: >60 mL/min (ref >60)
Glucose, Bld: 87 mg/dL (ref 70–99)
Potassium: 3.2 mmol/L — ABNORMAL LOW (ref 3.5–5.1)
Sodium: 141 mmol/L (ref 135–145)
Total Bilirubin: 0.6 mg/dL (ref 0.3–1.2)
Total Protein: 7 g/dL (ref 6.5–8.1)

## 2018-11-24 LAB — CBC WITH DIFFERENTIAL (CANCER CENTER ONLY)
Abs Immature Granulocytes: 0.01 10*3/uL (ref 0.00–0.07)
Basophils Absolute: 0.1 10*3/uL (ref 0.0–0.1)
Basophils Relative: 1 %
Eosinophils Absolute: 0.1 10*3/uL (ref 0.0–0.5)
Eosinophils Relative: 3 %
HCT: 39.6 % (ref 36.0–46.0)
Hemoglobin: 12.5 g/dL (ref 12.0–15.0)
Immature Granulocytes: 0 %
Lymphocytes Relative: 23 %
Lymphs Abs: 0.8 10*3/uL (ref 0.7–4.0)
MCH: 29.8 pg (ref 26.0–34.0)
MCHC: 31.6 g/dL (ref 30.0–36.0)
MCV: 94.3 fL (ref 80.0–100.0)
Monocytes Absolute: 0.4 10*3/uL (ref 0.1–1.0)
Monocytes Relative: 12 %
Neutro Abs: 2.1 10*3/uL (ref 1.7–7.7)
Neutrophils Relative %: 61 %
Platelet Count: 336 10*3/uL (ref 150–400)
RBC: 4.2 MIL/uL (ref 3.87–5.11)
RDW: 14.1 % (ref 11.5–15.5)
WBC Count: 3.5 10*3/uL — ABNORMAL LOW (ref 4.0–10.5)
nRBC: 0 % (ref 0.0–0.2)

## 2018-11-24 MED ORDER — SODIUM CHLORIDE 0.9 % IV SOLN
9.5000 mg/kg | Freq: Once | INTRAVENOUS | Status: AC
  Administered 2018-11-24: 12:00:00 740 mg via INTRAVENOUS
  Filled 2018-11-24: qty 10

## 2018-11-24 MED ORDER — SODIUM CHLORIDE 0.9 % IV SOLN
Freq: Once | INTRAVENOUS | Status: AC
  Administered 2018-11-24: 12:00:00 via INTRAVENOUS
  Filled 2018-11-24: qty 250

## 2018-11-24 NOTE — Telephone Encounter (Signed)
Potassium rich diet. -LVM with examples of food to eat.

## 2018-11-24 NOTE — Progress Notes (Signed)
[place] [phone]   [phone]  OFFICE PROGRESS NOTE  [name], MD [place] [address] [facility] [place] [address]  DIAGNOSIS: stage IIIB (T4, N2, M0) non-small cell lung cancer, poorly differentiated squamous cell carcinoma.  She presented with large right upper lobe lung mass in addition to right hilar and mediastinal lymphadenopathy diagnosed in January 2020.  PRIOR THERAPY: Concurrent chemoradiation with weekly carboplatin for AUC of 2 and paclitaxel 45 mg/M2.  First dose July 13, 2018.  Status post 8 cycles.  Last dose was given August 31, 2018.  CURRENT THERAPY: Consolidation treatment with immunotherapy with Imfinzi 10 mg/KG every 2 weeks.  First dose starts October 13, 2018.  Status post 3 cycles.  INTERVAL HISTORY: Christina [name] 73 y.o. female returns to the clinic today for follow-up visit.  The patient is feeling fine today with no concerning complaints.  She denied having any chest pain, shortness of breath, cough or hemoptysis.  She denied having any fever or chills.  She has no nausea, vomiting, diarrhea or constipation.  She continues to tolerate her treatment with Imfinzi.  The patient is here today for evaluation before starting cycle #4 of her treatment. MEDICAL HISTORY: Past Medical History:  Diagnosis Date  . Allergy   . Cancer ([place])    lung cancer  . GERD (gastroesophageal reflux disease)   . Hyperlipidemia   . Hypertension   . Reflux   . Tubulovillous adenoma of colon 2003    ALLERGIES:  is allergic to coconut oil; codeine; shrimp [shellfish allergy]; and trandolapril.  MEDICATIONS:  Current Outpatient Medications  Medication Sig Dispense Refill  . albuterol (PROAIR HFA) 108 (90 Base) MCG/ACT inhaler INHALE 2 PUFFS INTO THE LUNGS EVERY 6 HOURS AS NEEDED FOR WHEEZING OR SHORTNESS OF BREATH (Patient taking differently: Inhale 2 puffs into the lungs every 6 (six) hours as needed for wheezing  or shortness of breath. ) 8.5 g 0  . amLODipine (NORVASC) 10 MG tablet TAKE 1 TABLET(10 MG) BY MOUTH DAILY 90 tablet 0  . aspirin EC 81 MG tablet Take 81 mg by mouth daily.    Marland Kitchen atorvastatin (LIPITOR) 40 MG tablet TAKE 1 TABLET BY MOUTH DAILY 30 tablet 0  . HYDROcodone-acetaminophen (HYCET) 7.5-325 mg/15 ml solution Take 15 mLs by mouth 4 (four) times daily as needed. 473 mL 0  . omeprazole (PRILOSEC) 40 MG capsule TAKE 1 CAPSULE(40 MG) BY MOUTH DAILY 90 capsule 1  . prochlorperazine (COMPAZINE) 10 MG tablet Take 1 tablet (10 mg total) by mouth every 6 (six) hours as needed for nausea or vomiting. 30 tablet 0  . sucralfate (CARAFATE) 1 g tablet TAKE 1 TABLET BY MOUTH 1 HOUR BEFORE MEALS AND AT BEDTIME AS NEEDED (Patient taking differently: Take 1 g by mouth 4 (four) times daily as needed (stomach acid). ) 120 tablet prn   No current facility-administered medications for this visit.     SURGICAL HISTORY:  Past Surgical History:  Procedure Laterality Date  . CESAREAN SECTION     1 time  . COLONOSCOPY    . VIDEO BRONCHOSCOPY WITH ENDOBRONCHIAL NAVIGATION N/A 06/24/2018   Procedure: VIDEO BRONCHOSCOPY WITH ENDOBRONCHIAL NAVIGATION with biopsies, right upper lung lobe;  Surgeon: Melrose Nakayama, MD;  Location: [facility] OR;  Service: Thoracic;  Laterality: N/A;  . VIDEO BRONCHOSCOPY WITH ENDOBRONCHIAL ULTRASOUND N/A 06/24/2018   Procedure: VIDEO BRONCHOSCOPY WITH ENDOBRONCHIAL ULTRASOUND, right upper lung lobe;  Surgeon: Modesto Charon  C, MD;  Location: [facility] OR;  Service: Thoracic;  Laterality: N/A;    REVIEW OF SYSTEMS:  A comprehensive review of systems was negative.   PHYSICAL EXAMINATION: General appearance: alert, cooperative and no distress Head: Normocephalic, without obvious abnormality, atraumatic Neck: no adenopathy, no JVD, supple, symmetrical, trachea midline and thyroid not enlarged, symmetric, no tenderness/mass/nodules Lymph nodes: Cervical, supraclavicular, and axillary nodes  normal. Resp: clear to auscultation bilaterally Back: symmetric, no curvature. ROM normal. No CVA tenderness. Cardio: regular rate and rhythm, S1, S2 normal, no murmur, click, rub or gallop GI: soft, non-tender; bowel sounds normal; no masses,  no organomegaly Extremities: extremities normal, atraumatic, no cyanosis or edema  ECOG PERFORMANCE STATUS: 0 - Asymptomatic  Blood pressure 126/90, pulse 87, temperature 98.7 F (37.1 C), temperature source Oral, resp. rate 18, height 5\' 7"  (1.702 m), weight 171 lb 8 oz (77.8 kg), SpO2 100 %.  LABORATORY DATA: Lab Results  Component Value Date   WBC 3.5 (L) 11/24/2018   HGB 12.5 11/24/2018   HCT 39.6 11/24/2018   MCV 94.3 11/24/2018   PLT 336 11/24/2018      Chemistry      Component Value Date/Time   NA 142 11/11/2018 0945   NA 144 04/02/2017 1435   K 3.3 (L) 11/11/2018 0945   CL 108 11/11/2018 0945   CO2 25 11/11/2018 0945   BUN 13 11/11/2018 0945   BUN 11 04/02/2017 1435   CREATININE 0.92 11/11/2018 0945   CREATININE 0.87 03/22/2016 0908      Component Value Date/Time   CALCIUM 9.3 11/11/2018 0945   ALKPHOS 86 11/11/2018 0945   AST 15 11/11/2018 0945   ALT 13 11/11/2018 0945   BILITOT 0.4 11/11/2018 0945       RADIOGRAPHIC STUDIES: No results found.  ASSESSMENT AND PLAN: This is a very pleasant 73 years old African-American female recently diagnosed with a stage IIIB (T4, N2, M0) non-small cell lung cancer, poorly differentiated squamous cell carcinoma presented with right upper lobe lung mass in addition to right hilar and mediastinal lymphadenopathy. The patient underwent concurrent chemoradiation with weekly carboplatin and paclitaxel status post 8 cycles. She has partial response to this treatment. The patient was started on consolidation treatment with immunotherapy with Imfinzi 10 mg/KG every 2 weeks status post 3 cycles. She has been tolerating this treatment well with no concerning adverse effects. I recommended  for her to proceed with cycle #4 today as planned. The patient will come back for follow-up visit in 2 weeks for evaluation before starting cycle #5. She was advised to call immediately if she has any concerning symptoms in the interval. The patient voices understanding of current disease status and treatment options and is in agreement with the current care plan.  All questions were answered. The patient knows to call the clinic with any problems, questions or concerns. We can certainly see the patient much sooner if necessary.  Disclaimer: This note was dictated with voice recognition software. Similar sounding words can inadvertently be transcribed and may not be corrected upon review.

## 2018-11-24 NOTE — Patient Instructions (Signed)
[facility] Discharge Instructions for Patients Receiving Chemotherapy  Today you received the following chemotherapy agents Durvalumab (IMFINZI).  To help prevent nausea and vomiting after your treatment, we encourage you to take your nausea medication as prescribed.   If you develop nausea and vomiting that is not controlled by your nausea medication, call the clinic.   BELOW ARE SYMPTOMS THAT SHOULD BE REPORTED IMMEDIATELY:  *FEVER GREATER THAN 100.5 F  *CHILLS WITH OR WITHOUT FEVER  NAUSEA AND VOMITING THAT IS NOT CONTROLLED WITH YOUR NAUSEA MEDICATION  *UNUSUAL SHORTNESS OF BREATH  *UNUSUAL BRUISING OR BLEEDING  TENDERNESS IN MOUTH AND THROAT WITH OR WITHOUT PRESENCE OF ULCERS  *URINARY PROBLEMS  *BOWEL PROBLEMS  UNUSUAL RASH Items with * indicate a potential emergency and should be followed up as soon as possible.  Feel free to call the clinic should you have any questions or concerns. The clinic phone number is [phone].  Please show the [facility] at check-in to the Emergency Department and triage nurse.  Coronavirus (COVID-19) Are you at risk?  Are you at risk for the Coronavirus (COVID-19)?  To be considered HIGH RISK for Coronavirus (COVID-19), you have to meet the following criteria:  . Traveled to [place], [place], [place], [place] or [place]; or in the [place] to [place], [place], [place], or [place]; and have fever, cough, and shortness of breath within the last 2 weeks of travel OR . Been in close contact with a person diagnosed with COVID-19 within the last 2 weeks and have fever, cough, and shortness of breath . IF YOU DO NOT MEET THESE CRITERIA, YOU ARE CONSIDERED LOW RISK FOR COVID-19.  What to do if you are HIGH RISK for COVID-19?  Marland Kitchen If you are having a medical emergency, call 911. . Seek medical care right away. Before you go to a doctor's office, urgent care or emergency department, call ahead and tell them  about your recent travel, contact with someone diagnosed with COVID-19, and your symptoms. You should receive instructions from your physician's office regarding next steps of care.  . When you arrive at healthcare provider, tell the healthcare staff immediately you have returned from visiting [place], [place], [place], [place] or [place]; or traveled in the [place] to [place], [place], [place], or [place]; in the last two weeks or you have been in close contact with a person diagnosed with COVID-19 in the last 2 weeks.   . Tell the health care staff about your symptoms: fever, cough and shortness of breath. . After you have been seen by a medical provider, you will be either: o Tested for (COVID-19) and discharged home on quarantine except to seek medical care if symptoms worsen, and asked to  - Stay home and avoid contact with others until you get your results (4-5 days)  - Avoid travel on public transportation if possible (such as bus, train, or airplane) or o Sent to the Emergency Department by EMS for evaluation, COVID-19 testing, and possible admission depending on your condition and test results.  What to do if you are LOW RISK for COVID-19?  Reduce your risk of any infection by using the same precautions used for avoiding the common cold or flu:  Marland Kitchen Wash your hands often with soap and warm water for at least 20 seconds.  If soap and water are not readily available, use an alcohol-based hand sanitizer with at least 60% alcohol.  . If coughing or  sneezing, cover your mouth and nose by coughing or sneezing into the elbow areas of your shirt or coat, into a tissue or into your sleeve (not your hands). . Avoid shaking hands with others and consider head nods or verbal greetings only. . Avoid touching your eyes, nose, or mouth with unwashed hands.  . Avoid close contact with people who are sick. . Avoid places or events with large numbers of people in one location, like concerts or  sporting events. . Carefully consider travel plans you have or are making. . If you are planning any travel outside or inside the [place], visit the CDC's Travelers' Health webpage for the latest health notices. . If you have some symptoms but not all symptoms, continue to monitor at home and seek medical attention if your symptoms worsen. . If you are having a medical emergency, call 911.   [place] / e-Visit: [email or web address]         [facility]: [facility]: [phone]                   [facility]: [phone]

## 2018-11-25 ENCOUNTER — Telehealth (INDEPENDENT_AMBULATORY_CARE_PROVIDER_SITE_OTHER): Payer: BC Managed Care – PPO | Admitting: Family Medicine

## 2018-11-25 ENCOUNTER — Telehealth: Payer: Self-pay | Admitting: Internal Medicine

## 2018-11-25 VITALS — BP 120/90 | Ht 67.0 in | Wt 171.0 lb

## 2018-11-25 DIAGNOSIS — Z5181 Encounter for therapeutic drug level monitoring: Secondary | ICD-10-CM | POA: Diagnosis not present

## 2018-11-25 DIAGNOSIS — R062 Wheezing: Secondary | ICD-10-CM | POA: Diagnosis not present

## 2018-11-25 DIAGNOSIS — K219 Gastro-esophageal reflux disease without esophagitis: Secondary | ICD-10-CM

## 2018-11-25 DIAGNOSIS — I10 Essential (primary) hypertension: Secondary | ICD-10-CM

## 2018-11-25 DIAGNOSIS — C3491 Malignant neoplasm of unspecified part of right bronchus or lung: Secondary | ICD-10-CM

## 2018-11-25 MED ORDER — OMEPRAZOLE 40 MG PO CPDR: DELAYED_RELEASE_CAPSULE | ORAL | 3 refills | Status: DC

## 2018-11-25 MED ORDER — ALBUTEROL SULFATE HFA 108 (90 BASE) MCG/ACT IN AERS: 2.0000 | INHALATION_SPRAY | Freq: Four times a day (QID) | RESPIRATORY_TRACT | 3 refills | Status: DC | PRN

## 2018-11-25 MED ORDER — POTASSIUM CHLORIDE CRYS ER 10 MEQ PO TBCR: 10.0000 meq | EXTENDED_RELEASE_TABLET | Freq: Two times a day (BID) | ORAL | 3 refills | Status: DC

## 2018-11-25 MED ORDER — AMLODIPINE BESYLATE 10 MG PO TABS: ORAL_TABLET | ORAL | 3 refills | Status: DC

## 2018-11-25 MED ORDER — ATORVASTATIN CALCIUM 40 MG PO TABS: ORAL_TABLET | ORAL | 3 refills | Status: DC

## 2018-11-25 NOTE — Progress Notes (Signed)
Telemedicine Encounter- SOAP NOTE Established Patient  This telephone encounter was conducted with the patient's (or proxy's) verbal consent via audio telecommunications: yes/no: Yes Patient was instructed to have this encounter in a suitably private space; and to only have persons present to whom they give permission to participate. In addition, patient identity was confirmed by use of name plus two identifiers (DOB and address).  I discussed the limitations, risks, security and privacy concerns of performing an evaluation and management service by telephone and the availability of in person appointments. I also discussed with the patient that there may be a patient responsible charge related to this service. The patient expressed understanding and agreed to proceed.  I spent a total of TIME; 0 MIN TO 60 MIN: 25 minutes talking with the patient or their proxy.  CC: med refill, hypertension  Subjective   Christina [name] is a 73 y.o. established patient. Telephone visit today for  HPI Hypertension: Patient here for follow-up of elevated blood pressure. She is not exercising and is adherent to low salt diet.  Blood pressure is well controlled at home. Cardiac symptoms none. Patient denies chest pain, chest pressure/discomfort, claudication, exertional chest pressure/discomfort, fatigue and irregular heart beat.  Cardiovascular risk factors: hypertension. Use of agents associated with hypertension: none. History of target organ damage: none. BP Readings from Last 3 Encounters:  11/25/18 120/90  11/24/18 126/90  11/11/18 124/85   Wt Readings from Last 3 Encounters:  11/25/18 171 lb (77.6 kg)  11/24/18 171 lb 8 oz (77.8 kg)  11/11/18 172 lb 14.4 oz (78.4 kg)     Stage III squamous cell carcinoma of right lung She has some wheezing but no cough No weight loss She had an infusion yesterday She coughed up some yellow mucus and thinks it is from her chest She denies fevers or chills She  denies hemoptysis  Wheezing She reports that she gets some wheezing if she goes outside and the grass is cut or the pollen is high She uses her inhaler prn No night time coughing No DOE or SOB Denies wheezing currently  GERD She reports that she has been doing well now that she is eating softer blander foods She takes her omeprazole as instructed She needs refill of her medications   Patient Active Problem List   Diagnosis Date Noted  . Encounter for antineoplastic immunotherapy 10/06/2018  . Stage III squamous cell carcinoma of right lung ([place]) 07/06/2018  . Goals of care, counseling/discussion 07/06/2018  . Encounter for antineoplastic chemotherapy 07/06/2018  . Primary cancer of right upper lobe of lung ([place]) 06/18/2018  . Acute URI 05/25/2018  . Seasonal allergies 10/06/2017  . Thyromegaly 07/01/2017  . Colon polyps 04/02/2017  . Former smoker 09/27/2016  . Cough 04/25/2016  . 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 GERD (gastroesophageal reflux disease) 08/10/2013  . Hyperlipidemia 06/23/2013  . Essential hypertension 07/23/2012    Past Medical History:  Diagnosis Date  . Allergy   . Cancer ([place])    lung cancer  . GERD (gastroesophageal reflux disease)   . Hyperlipidemia   . Hypertension   . Reflux   . Tubulovillous adenoma of colon 2003    Current Outpatient Medications  Medication Sig Dispense Refill  . albuterol (PROAIR HFA) 108 (90 Base) MCG/ACT inhaler Inhale 2 puffs into the lungs every 6 (six) hours as needed for wheezing or shortness of breath. 1 Inhaler 3  . amLODipine (NORVASC) 10 MG tablet TAKE 1 TABLET(10 MG) BY MOUTH  DAILY 90 tablet 3  . aspirin EC 81 MG tablet Take 81 mg by mouth daily.    Marland Kitchen atorvastatin (LIPITOR) 40 MG tablet TAKE 1 TABLET BY MOUTH DAILY 90 tablet 3  . HYDROcodone-acetaminophen (HYCET) 7.5-325 mg/15 ml solution Take 15 mLs by mouth 4 (four) times daily as needed. 473 mL 0  . omeprazole (PRILOSEC) 40 MG capsule TAKE 1  CAPSULE(40 MG) BY MOUTH DAILY 90 capsule 3  . sucralfate (CARAFATE) 1 g tablet TAKE 1 TABLET BY MOUTH 1 HOUR BEFORE MEALS AND AT BEDTIME AS NEEDED (Patient taking differently: Take 1 g by mouth 4 (four) times daily as needed (stomach acid). ) 120 tablet prn  . potassium chloride (K-DUR) 10 MEQ tablet Take 1 tablet (10 mEq total) by mouth 2 (two) times daily. 60 tablet 3  . prochlorperazine (COMPAZINE) 10 MG tablet Take 1 tablet (10 mg total) by mouth every 6 (six) hours as needed for nausea or vomiting. (Patient not taking: Reported on 11/25/2018) 30 tablet 0   No current facility-administered medications for this visit.     Allergies  Allergen Reactions  . Coconut Oil Hives  . Codeine Other (See Comments)    Upsets stomach really bad  . Shrimp [Shellfish Allergy] Hives  . Trandolapril Swelling    Swelling of the face/lips 07/2014    Social History   Socioeconomic History  . Marital status: Married    Spouse name: [name]  . Number of children: 1  . Years of education: 12th grade  . Highest education level: Not on file  Occupational History  . Occupation: school housekeeping    Comment: part-time  Social Needs  . Financial resource strain: Not on file  . Food insecurity:    Worry: Not on file    Inability: Not on file  . Transportation needs:    Medical: Not on file    Non-medical: Not on file  Tobacco Use  . Smoking status: Former Smoker    Packs/day: 0.50    Years: 50.00    Pack years: 25.00    Types: Cigarettes  . Smokeless tobacco: Never Used  . Tobacco comment: Previously quit in 2015 but has started smoking again   Substance and Sexual Activity  . Alcohol use: No    Alcohol/week: 0.0 standard drinks  . Drug use: No  . Sexual activity: Never  Lifestyle  . Physical activity:    Days per week: Not on file    Minutes per session: Not on file  . Stress: Not on file  Relationships  . Social connections:    Talks on phone: Not on file    Gets together: Not on  file    Attends religious service: Not on file    Active member of club or organization: Not on file    Attends meetings of clubs or organizations: Not on file    Relationship status: Not on file  . Intimate partner violence:    Fear of current or ex partner: Not on file    Emotionally abused: Not on file    Physically abused: Not on file    Forced sexual activity: Not on file  Other Topics Concern  . Not on file  Social History Narrative   Lives with her husband. Her daughter also lives in [place] with her husband and 3 children.    ROS Review of Systems  Constitutional: Negative for activity change, appetite change, chills and fever.  HENT: Negative for congestion, nosebleeds, trouble swallowing and voice  change.   Respiratory: see hpi  +wheezing if she is exposed to pollen  Gastrointestinal: Negative for diarrhea, nausea and vomiting.  Genitourinary: Negative for difficulty urinating, dysuria, flank pain and hematuria.  Musculoskeletal: Negative for back pain, joint swelling and neck pain.  Neurological: Negative for dizziness, speech difficulty, light-headedness and numbness.  See HPI. All other review of systems negative.   Objective   Vitals as reported by the patient: Today's Vitals   11/25/18 0853  BP: 120/90  Weight: 171 lb (77.6 kg)  Height: 5\' 7"  (1.702 m)   Normal pulmonary effort Unable to do exam since this is a virtual visit   Diagnoses and all orders for this visit:  Stage III squamous cell carcinoma of right lung [facility])- continue Oncology recs  Essential hypertension- Patient's blood pressure is at goal of 139/89 or less. Condition is stable. Continue current medications and treatment plan. I recommend that you exercise for 30-45 minutes 5 days a week. I also recommend a balanced diet with fruits and vegetables every day, lean meats, and little fried foods. The DASH diet (you can find this online) is a good example of this.  -     amLODipine  (NORVASC) 10 MG tablet; TAKE 1 TABLET(10 MG) BY MOUTH DAILY  Encounter for medication monitoring -     atorvastatin (LIPITOR) 40 MG tablet; TAKE 1 TABLET BY MOUTH DAILY  Gastroesophageal reflux disease without esophagitis -     omeprazole (PRILOSEC) 40 MG capsule; TAKE 1 CAPSULE(40 MG) BY MOUTH DAILY  Expiratory wheezing Comments: Asymptomatic. Uses albuterol PRN. Orders: -     albuterol (PROAIR HFA) 108 (90 Base) MCG/ACT inhaler; Inhale 2 puffs into the lungs every 6 (six) hours as needed for wheezing or shortness of breath.  Other orders -     potassium chloride (K-DUR) 10 MEQ tablet; Take 1 tablet (10 mEq total) by mouth 2 (two) times daily.     I discussed the assessment and treatment plan with the patient. The patient was provided an opportunity to ask questions and all were answered. The patient agreed with the plan and demonstrated an understanding of the instructions.   The patient was advised to call back or seek an in-person evaluation if the symptoms worsen or if the condition fails to improve as anticipated.  I provided 25 minutes of non-face-to-face time during this encounter.  [name], MD  Primary Care at [facility]

## 2018-11-25 NOTE — Progress Notes (Signed)
CC: Patient states that this appointment is for 6 month follow up for hypertension. Pt states she needs a refill on Albuterol.

## 2018-11-25 NOTE — Telephone Encounter (Signed)
Added additional cycle per 6/09los - pt to get an updated schedule next visit.

## 2018-11-27 MED ORDER — DIPHENHYDRAMINE HCL 25 MG PO CAPS
ORAL_CAPSULE | ORAL | Status: AC
  Filled 2018-11-27: qty 2

## 2018-11-27 MED ORDER — ACETAMINOPHEN 325 MG PO TABS
ORAL_TABLET | ORAL | Status: AC
  Filled 2018-11-27: qty 2

## 2018-12-09 ENCOUNTER — Other Ambulatory Visit: Payer: Self-pay

## 2018-12-09 ENCOUNTER — Inpatient Hospital Stay: Payer: BC Managed Care – PPO

## 2018-12-09 ENCOUNTER — Encounter: Payer: Self-pay | Admitting: Internal Medicine

## 2018-12-09 ENCOUNTER — Inpatient Hospital Stay (HOSPITAL_BASED_OUTPATIENT_CLINIC_OR_DEPARTMENT_OTHER): Payer: BC Managed Care – PPO | Admitting: Internal Medicine

## 2018-12-09 VITALS — BP 120/82 | HR 81 | Temp 97.7°F | Resp 18 | Ht 67.0 in | Wt 173.7 lb

## 2018-12-09 DIAGNOSIS — Z5112 Encounter for antineoplastic immunotherapy: Secondary | ICD-10-CM | POA: Diagnosis not present

## 2018-12-09 DIAGNOSIS — I10 Essential (primary) hypertension: Secondary | ICD-10-CM

## 2018-12-09 DIAGNOSIS — Z923 Personal history of irradiation: Secondary | ICD-10-CM

## 2018-12-09 DIAGNOSIS — C3411 Malignant neoplasm of upper lobe, right bronchus or lung: Secondary | ICD-10-CM

## 2018-12-09 DIAGNOSIS — Z9221 Personal history of antineoplastic chemotherapy: Secondary | ICD-10-CM | POA: Diagnosis not present

## 2018-12-09 DIAGNOSIS — C3491 Malignant neoplasm of unspecified part of right bronchus or lung: Secondary | ICD-10-CM

## 2018-12-09 LAB — CMP (CANCER CENTER ONLY)
ALT: 29 U/L (ref 0–44)
AST: 24 U/L (ref 15–41)
Albumin: 3.8 g/dL (ref 3.5–5.0)
Alkaline Phosphatase: 98 U/L (ref 38–126)
Anion gap: 9 (ref 5–15)
BUN: 12 mg/dL (ref 8–23)
CO2: 26 mmol/L (ref 22–32)
Calcium: 9.2 mg/dL (ref 8.9–10.3)
Chloride: 106 mmol/L (ref 98–111)
Creatinine: 0.98 mg/dL (ref 0.44–1.00)
GFR, Est AFR Am: >60 mL/min (ref >60)
GFR, Estimated: 58 mL/min — ABNORMAL LOW (ref >60)
Glucose, Bld: 90 mg/dL (ref 70–99)
Potassium: 3.6 mmol/L (ref 3.5–5.1)
Sodium: 141 mmol/L (ref 135–145)
Total Bilirubin: 0.4 mg/dL (ref 0.3–1.2)
Total Protein: 7.4 g/dL (ref 6.5–8.1)

## 2018-12-09 LAB — CBC WITH DIFFERENTIAL (CANCER CENTER ONLY)
Abs Immature Granulocytes: 0.03 10*3/uL (ref 0.00–0.07)
Basophils Absolute: 0.1 10*3/uL (ref 0.0–0.1)
Basophils Relative: 1 %
Eosinophils Absolute: 0.1 10*3/uL (ref 0.0–0.5)
Eosinophils Relative: 3 %
HCT: 41.9 % (ref 36.0–46.0)
Hemoglobin: 13.2 g/dL (ref 12.0–15.0)
Immature Granulocytes: 1 %
Lymphocytes Relative: 22 %
Lymphs Abs: 0.8 10*3/uL (ref 0.7–4.0)
MCH: 29.8 pg (ref 26.0–34.0)
MCHC: 31.5 g/dL (ref 30.0–36.0)
MCV: 94.6 fL (ref 80.0–100.0)
Monocytes Absolute: 0.3 10*3/uL (ref 0.1–1.0)
Monocytes Relative: 7 %
Neutro Abs: 2.5 10*3/uL (ref 1.7–7.7)
Neutrophils Relative %: 66 %
Platelet Count: 258 10*3/uL (ref 150–400)
RBC: 4.43 MIL/uL (ref 3.87–5.11)
RDW: 13.9 % (ref 11.5–15.5)
WBC Count: 3.8 10*3/uL — ABNORMAL LOW (ref 4.0–10.5)
nRBC: 0 % (ref 0.0–0.2)

## 2018-12-09 LAB — TSH: TSH: 0.285 u[IU]/mL — ABNORMAL LOW (ref 0.308–3.960)

## 2018-12-09 MED ORDER — SODIUM CHLORIDE 0.9 % IV SOLN
Freq: Once | INTRAVENOUS | Status: AC
  Administered 2018-12-09: 14:00:00 via INTRAVENOUS
  Filled 2018-12-09: qty 250

## 2018-12-09 MED ORDER — SODIUM CHLORIDE 0.9 % IV SOLN
9.5000 mg/kg | Freq: Once | INTRAVENOUS | Status: AC
  Administered 2018-12-09: 740 mg via INTRAVENOUS
  Filled 2018-12-09: qty 10

## 2018-12-09 NOTE — Patient Instructions (Signed)
[facility] Discharge Instructions for Patients Receiving Chemotherapy  Today you received the following chemotherapy agents Durvalumab (IMFINZI).  To help prevent nausea and vomiting after your treatment, we encourage you to take your nausea medication as prescribed.   If you develop nausea and vomiting that is not controlled by your nausea medication, call the clinic.   BELOW ARE SYMPTOMS THAT SHOULD BE REPORTED IMMEDIATELY:  *FEVER GREATER THAN 100.5 F  *CHILLS WITH OR WITHOUT FEVER  NAUSEA AND VOMITING THAT IS NOT CONTROLLED WITH YOUR NAUSEA MEDICATION  *UNUSUAL SHORTNESS OF BREATH  *UNUSUAL BRUISING OR BLEEDING  TENDERNESS IN MOUTH AND THROAT WITH OR WITHOUT PRESENCE OF ULCERS  *URINARY PROBLEMS  *BOWEL PROBLEMS  UNUSUAL RASH Items with * indicate a potential emergency and should be followed up as soon as possible.  Feel free to call the clinic should you have any questions or concerns. The clinic phone number is [phone].  Please show the [facility] at check-in to the Emergency Department and triage nurse.  Coronavirus (COVID-19) Are you at risk?  Are you at risk for the Coronavirus (COVID-19)?  To be considered HIGH RISK for Coronavirus (COVID-19), you have to meet the following criteria:  . Traveled to [place], [place], [place], [place] or [place]; or in the [place] to [place], [place], [place], or [place]; and have fever, cough, and shortness of breath within the last 2 weeks of travel OR . Been in close contact with a person diagnosed with COVID-19 within the last 2 weeks and have fever, cough, and shortness of breath . IF YOU DO NOT MEET THESE CRITERIA, YOU ARE CONSIDERED LOW RISK FOR COVID-19.  What to do if you are HIGH RISK for COVID-19?  Marland Kitchen If you are having a medical emergency, call 911. . Seek medical care right away. Before you go to a doctor's office, urgent care or emergency department, call ahead and tell them  about your recent travel, contact with someone diagnosed with COVID-19, and your symptoms. You should receive instructions from your physician's office regarding next steps of care.  . When you arrive at healthcare provider, tell the healthcare staff immediately you have returned from visiting [place], [place], [place], [place] or [place]; or traveled in the [place] to [place], [place], [place], or [place]; in the last two weeks or you have been in close contact with a person diagnosed with COVID-19 in the last 2 weeks.   . Tell the health care staff about your symptoms: fever, cough and shortness of breath. . After you have been seen by a medical provider, you will be either: o Tested for (COVID-19) and discharged home on quarantine except to seek medical care if symptoms worsen, and asked to  - Stay home and avoid contact with others until you get your results (4-5 days)  - Avoid travel on public transportation if possible (such as bus, train, or airplane) or o Sent to the Emergency Department by EMS for evaluation, COVID-19 testing, and possible admission depending on your condition and test results.  What to do if you are LOW RISK for COVID-19?  Reduce your risk of any infection by using the same precautions used for avoiding the common cold or flu:  Marland Kitchen Wash your hands often with soap and warm water for at least 20 seconds.  If soap and water are not readily available, use an alcohol-based hand sanitizer with at least 60% alcohol.  . If coughing or  sneezing, cover your mouth and nose by coughing or sneezing into the elbow areas of your shirt or coat, into a tissue or into your sleeve (not your hands). . Avoid shaking hands with others and consider head nods or verbal greetings only. . Avoid touching your eyes, nose, or mouth with unwashed hands.  . Avoid close contact with people who are sick. . Avoid places or events with large numbers of people in one location, like concerts or  sporting events. . Carefully consider travel plans you have or are making. . If you are planning any travel outside or inside the [place], visit the CDC's Travelers' Health webpage for the latest health notices. . If you have some symptoms but not all symptoms, continue to monitor at home and seek medical attention if your symptoms worsen. . If you are having a medical emergency, call 911.   [place] / e-Visit: [email or web address]         [facility]: [facility]: [phone]                   [facility]: [phone]

## 2018-12-09 NOTE — Progress Notes (Signed)
[facility] Telephone:([phone]   [phone]  OFFICE PROGRESS NOTE  [name], MD [address] [facility] [place] [address]  DIAGNOSIS: stage IIIB (T4, N2, M0) non-small cell lung cancer, poorly differentiated squamous cell carcinoma.  She presented with large right upper lobe lung mass in addition to right hilar and mediastinal lymphadenopathy diagnosed in January 2020.  PRIOR THERAPY: Concurrent chemoradiation with weekly carboplatin for AUC of 2 and paclitaxel 45 mg/M2.  First dose July 13, 2018.  Status post 8 cycles.  Last dose was given August 31, 2018.  CURRENT THERAPY: Consolidation treatment with immunotherapy with Imfinzi 10 mg/KG every 2 weeks.  First dose starts October 13, 2018.  Status post 4 cycles.  INTERVAL HISTORY: Christina [name] 73 y.o. female returns to the clinic today for follow-up visit.  The patient is feeling fine today with no concerning complaints.  She continues to tolerate her consolidation treatment with Imfinzi fairly well.  She denied having any chest pain, shortness of breath, cough or hemoptysis.  She denied having any fever or chills.  She has no nausea, vomiting, diarrhea or constipation.  She denied having any skin rash.  The patient is here today for evaluation before starting cycle #5.  MEDICAL HISTORY: Past Medical History:  Diagnosis Date  . Allergy   . Cancer ([place])    lung cancer  . GERD (gastroesophageal reflux disease)   . Hyperlipidemia   . Hypertension   . Reflux   . Tubulovillous adenoma of colon 2003    ALLERGIES:  is allergic to coconut oil; codeine; shrimp [shellfish allergy]; and trandolapril.  MEDICATIONS:  Current Outpatient Medications  Medication Sig Dispense Refill  . albuterol (PROAIR HFA) 108 (90 Base) MCG/ACT inhaler Inhale 2 puffs into the lungs every 6 (six) hours as needed for wheezing or shortness of breath. 1 Inhaler 3  . amLODipine (NORVASC) 10 MG tablet  TAKE 1 TABLET(10 MG) BY MOUTH DAILY 90 tablet 3  . aspirin EC 81 MG tablet Take 81 mg by mouth daily.    Marland Kitchen atorvastatin (LIPITOR) 40 MG tablet TAKE 1 TABLET BY MOUTH DAILY 90 tablet 3  . HYDROcodone-acetaminophen (HYCET) 7.5-325 mg/15 ml solution Take 15 mLs by mouth 4 (four) times daily as needed. 473 mL 0  . omeprazole (PRILOSEC) 40 MG capsule TAKE 1 CAPSULE(40 MG) BY MOUTH DAILY 90 capsule 3  . potassium chloride (K-DUR) 10 MEQ tablet Take 1 tablet (10 mEq total) by mouth 2 (two) times daily. 60 tablet 3  . prochlorperazine (COMPAZINE) 10 MG tablet Take 1 tablet (10 mg total) by mouth every 6 (six) hours as needed for nausea or vomiting. (Patient not taking: Reported on 11/25/2018) 30 tablet 0  . sucralfate (CARAFATE) 1 g tablet TAKE 1 TABLET BY MOUTH 1 HOUR BEFORE MEALS AND AT BEDTIME AS NEEDED (Patient taking differently: Take 1 g by mouth 4 (four) times daily as needed (stomach acid). ) 120 tablet prn   No current facility-administered medications for this visit.     SURGICAL HISTORY:  Past Surgical History:  Procedure Laterality Date  . CESAREAN SECTION     1 time  . COLONOSCOPY    . VIDEO BRONCHOSCOPY WITH ENDOBRONCHIAL NAVIGATION N/A 06/24/2018   Procedure: VIDEO BRONCHOSCOPY WITH ENDOBRONCHIAL NAVIGATION with biopsies, right upper lung lobe;  Surgeon: Melrose Nakayama, MD;  Location: [facility] OR;  Service: Thoracic;  Laterality: N/A;  . VIDEO BRONCHOSCOPY WITH ENDOBRONCHIAL ULTRASOUND N/A 06/24/2018  Procedure: VIDEO BRONCHOSCOPY WITH ENDOBRONCHIAL ULTRASOUND, right upper lung lobe;  Surgeon: Melrose Nakayama, MD;  Location: [facility] OR;  Service: Thoracic;  Laterality: N/A;    REVIEW OF SYSTEMS:  A comprehensive review of systems was negative.   PHYSICAL EXAMINATION: General appearance: alert, cooperative and no distress Head: Normocephalic, without obvious abnormality, atraumatic Neck: no adenopathy, no JVD, supple, symmetrical, trachea midline and thyroid not enlarged, symmetric,  no tenderness/mass/nodules Lymph nodes: Cervical, supraclavicular, and axillary nodes normal. Resp: clear to auscultation bilaterally Back: symmetric, no curvature. ROM normal. No CVA tenderness. Cardio: regular rate and rhythm, S1, S2 normal, no murmur, click, rub or gallop GI: soft, non-tender; bowel sounds normal; no masses,  no organomegaly Extremities: extremities normal, atraumatic, no cyanosis or edema  ECOG PERFORMANCE STATUS: 0 - Asymptomatic  Blood pressure 120/82, pulse 81, temperature 97.7 F (36.5 C), temperature source Oral, resp. rate 18, height 5\' 7"  (1.702 m), weight 173 lb 11.2 oz (78.8 kg), SpO2 100 %.  LABORATORY DATA: Lab Results  Component Value Date   WBC 3.8 (L) 12/09/2018   HGB 13.2 12/09/2018   HCT 41.9 12/09/2018   MCV 94.6 12/09/2018   PLT 258 12/09/2018      Chemistry      Component Value Date/Time   NA 141 11/24/2018 1018   NA 144 04/02/2017 1435   K 3.2 (L) 11/24/2018 1018   CL 105 11/24/2018 1018   CO2 27 11/24/2018 1018   BUN 11 11/24/2018 1018   BUN 11 04/02/2017 1435   CREATININE 0.93 11/24/2018 1018   CREATININE 0.87 03/22/2016 0908      Component Value Date/Time   CALCIUM 9.0 11/24/2018 1018   ALKPHOS 93 11/24/2018 1018   AST 15 11/24/2018 1018   ALT 11 11/24/2018 1018   BILITOT 0.6 11/24/2018 1018       RADIOGRAPHIC STUDIES: No results found.  ASSESSMENT AND PLAN: This is a very pleasant 73 years old African-American female recently diagnosed with a stage IIIB (T4, N2, M0) non-small cell lung cancer, poorly differentiated squamous cell carcinoma presented with right upper lobe lung mass in addition to right hilar and mediastinal lymphadenopathy. The patient underwent concurrent chemoradiation with weekly carboplatin and paclitaxel status post 8 cycles. She has partial response to this treatment. The patient was started on consolidation treatment with immunotherapy with Imfinzi 10 mg/KG every 2 weeks status post 4 cycles. The  patient continues to tolerate her treatment well with no concerning adverse effects. I recommended for her to proceed with cycle #5 today. She will come back for follow-up visit in 2 weeks for evaluation before cycle #6. She was advised to call immediately if she has any concerning symptoms in the interval. The patient voices understanding of current disease status and treatment options and is in agreement with the current care plan.  All questions were answered. The patient knows to call the clinic with any problems, questions or concerns. We can certainly see the patient much sooner if necessary.  Disclaimer: This note was dictated with voice recognition software. Similar sounding words can inadvertently be transcribed and may not be corrected upon review.

## 2018-12-22 ENCOUNTER — Other Ambulatory Visit: Payer: Self-pay

## 2018-12-22 ENCOUNTER — Inpatient Hospital Stay: Payer: BC Managed Care – PPO

## 2018-12-22 ENCOUNTER — Inpatient Hospital Stay (HOSPITAL_BASED_OUTPATIENT_CLINIC_OR_DEPARTMENT_OTHER): Payer: BC Managed Care – PPO | Admitting: Physician Assistant

## 2018-12-22 ENCOUNTER — Inpatient Hospital Stay: Payer: BC Managed Care – PPO | Attending: Internal Medicine

## 2018-12-22 VITALS — BP 128/82 | HR 95 | Temp 98.7°F | Resp 18

## 2018-12-22 VITALS — BP 121/82 | HR 94 | Temp 98.7°F | Resp 18 | Ht 67.0 in | Wt 173.2 lb

## 2018-12-22 DIAGNOSIS — Z79899 Other long term (current) drug therapy: Secondary | ICD-10-CM | POA: Insufficient documentation

## 2018-12-22 DIAGNOSIS — I10 Essential (primary) hypertension: Secondary | ICD-10-CM

## 2018-12-22 DIAGNOSIS — E785 Hyperlipidemia, unspecified: Secondary | ICD-10-CM

## 2018-12-22 DIAGNOSIS — Z5112 Encounter for antineoplastic immunotherapy: Secondary | ICD-10-CM | POA: Insufficient documentation

## 2018-12-22 DIAGNOSIS — Z923 Personal history of irradiation: Secondary | ICD-10-CM | POA: Insufficient documentation

## 2018-12-22 DIAGNOSIS — C3491 Malignant neoplasm of unspecified part of right bronchus or lung: Secondary | ICD-10-CM

## 2018-12-22 DIAGNOSIS — Z7982 Long term (current) use of aspirin: Secondary | ICD-10-CM | POA: Insufficient documentation

## 2018-12-22 DIAGNOSIS — C3411 Malignant neoplasm of upper lobe, right bronchus or lung: Secondary | ICD-10-CM | POA: Diagnosis not present

## 2018-12-22 DIAGNOSIS — Z87891 Personal history of nicotine dependence: Secondary | ICD-10-CM

## 2018-12-22 LAB — CBC WITH DIFFERENTIAL (CANCER CENTER ONLY)
Abs Immature Granulocytes: 0.02 10*3/uL (ref 0.00–0.07)
Basophils Absolute: 0 10*3/uL (ref 0.0–0.1)
Basophils Relative: 1 %
Eosinophils Absolute: 0.1 10*3/uL (ref 0.0–0.5)
Eosinophils Relative: 2 %
HCT: 39.5 % (ref 36.0–46.0)
Hemoglobin: 12.8 g/dL (ref 12.0–15.0)
Immature Granulocytes: 0 %
Lymphocytes Relative: 21 %
Lymphs Abs: 1 10*3/uL (ref 0.7–4.0)
MCH: 29.5 pg (ref 26.0–34.0)
MCHC: 32.4 g/dL (ref 30.0–36.0)
MCV: 91 fL (ref 80.0–100.0)
Monocytes Absolute: 0.5 10*3/uL (ref 0.1–1.0)
Monocytes Relative: 10 %
Neutro Abs: 3.1 10*3/uL (ref 1.7–7.7)
Neutrophils Relative %: 66 %
Platelet Count: 336 10*3/uL (ref 150–400)
RBC: 4.34 MIL/uL (ref 3.87–5.11)
RDW: 14.1 % (ref 11.5–15.5)
WBC Count: 4.8 10*3/uL (ref 4.0–10.5)
nRBC: 0 % (ref 0.0–0.2)

## 2018-12-22 LAB — CMP (CANCER CENTER ONLY)
ALT: 68 U/L — ABNORMAL HIGH (ref 0–44)
AST: 38 U/L (ref 15–41)
Albumin: 3.6 g/dL (ref 3.5–5.0)
Alkaline Phosphatase: 121 U/L (ref 38–126)
Anion gap: 10 (ref 5–15)
BUN: 14 mg/dL (ref 8–23)
CO2: 23 mmol/L (ref 22–32)
Calcium: 9.2 mg/dL (ref 8.9–10.3)
Chloride: 107 mmol/L (ref 98–111)
Creatinine: 1.03 mg/dL — ABNORMAL HIGH (ref 0.44–1.00)
GFR, Est AFR Am: >60 mL/min (ref >60)
GFR, Estimated: 55 mL/min — ABNORMAL LOW (ref >60)
Glucose, Bld: 95 mg/dL (ref 70–99)
Potassium: 3.7 mmol/L (ref 3.5–5.1)
Sodium: 140 mmol/L (ref 135–145)
Total Bilirubin: 0.3 mg/dL (ref 0.3–1.2)
Total Protein: 7.2 g/dL (ref 6.5–8.1)

## 2018-12-22 MED ORDER — SODIUM CHLORIDE 0.9 % IV SOLN
Freq: Once | INTRAVENOUS | Status: AC
  Administered 2018-12-22: 14:00:00 via INTRAVENOUS
  Filled 2018-12-22: qty 250

## 2018-12-22 MED ORDER — SODIUM CHLORIDE 0.9 % IV SOLN
740.0000 mg | Freq: Once | INTRAVENOUS | Status: AC
  Administered 2018-12-22: 740 mg via INTRAVENOUS
  Filled 2018-12-22: qty 10

## 2018-12-22 NOTE — Patient Instructions (Signed)
Coronavirus (COVID-19) Are you at risk?  Are you at risk for the Coronavirus (COVID-19)?  To be considered HIGH RISK for Coronavirus (COVID-19), you have to meet the following criteria:  . Traveled to [place], [place], [place], [place] or [place]; or in the [place] to [place], [place], [place], or [place]; and have fever, cough, and shortness of breath within the last 2 weeks of travel OR . Been in close contact with a person diagnosed with COVID-19 within the last 2 weeks and have fever, cough, and shortness of breath . IF YOU DO NOT MEET THESE CRITERIA, YOU ARE CONSIDERED LOW RISK FOR COVID-19.  What to do if you are HIGH RISK for COVID-19?  Marland Kitchen If you are having a medical emergency, call 911. . Seek medical care right away. Before you go to a doctor's office, urgent care or emergency department, call ahead and tell them about your recent travel, contact with someone diagnosed with COVID-19, and your symptoms. You should receive instructions from your physician's office regarding next steps of care.  . When you arrive at healthcare provider, tell the healthcare staff immediately you have returned from visiting [place], [place], [place], [place] or [place]; or traveled in the [place] to [place], [place], [place], or [place]; in the last two weeks or you have been in close contact with a person diagnosed with COVID-19 in the last 2 weeks.   . Tell the health care staff about your symptoms: fever, cough and shortness of breath. . After you have been seen by a medical provider, you will be either: o Tested for (COVID-19) and discharged home on quarantine except to seek medical care if symptoms worsen, and asked to  - Stay home and avoid contact with others until you get your results (4-5 days)  - Avoid travel on public transportation if possible (such as bus, train, or airplane) or o Sent to the Emergency Department by EMS for evaluation, COVID-19 testing, and possible  admission depending on your condition and test results.  What to do if you are LOW RISK for COVID-19?  Reduce your risk of any infection by using the same precautions used for avoiding the common cold or flu:  Marland Kitchen Wash your hands often with soap and warm water for at least 20 seconds.  If soap and water are not readily available, use an alcohol-based hand sanitizer with at least 60% alcohol.  . If coughing or sneezing, cover your mouth and nose by coughing or sneezing into the elbow areas of your shirt or coat, into a tissue or into your sleeve (not your hands). . Avoid shaking hands with others and consider head nods or verbal greetings only. . Avoid touching your eyes, nose, or mouth with unwashed hands.  . Avoid close contact with people who are sick. . Avoid places or events with large numbers of people in one location, like concerts or sporting events. . Carefully consider travel plans you have or are making. . If you are planning any travel outside or inside the [place], visit the CDC's Travelers' Health webpage for the latest health notices. . If you have some symptoms but not all symptoms, continue to monitor at home and seek medical attention if your symptoms worsen. . If you are having a medical emergency, call 911.   [place] / e-Visit: [email or web address]         [facility]: [place]  [facility]: [facility] [facility]: [facility] Discharge Instructions for Patients Receiving Chemotherapy  Today you received the following chemotherapy agents Imfinzi  To help prevent nausea and vomiting after your treatment, we encourage you to take your nausea medication as directed.    If you develop nausea and vomiting that is not controlled by your nausea medication, call the clinic.   BELOW ARE  SYMPTOMS THAT SHOULD BE REPORTED IMMEDIATELY:  *FEVER GREATER THAN 100.5 F  *CHILLS WITH OR WITHOUT FEVER  NAUSEA AND VOMITING THAT IS NOT CONTROLLED WITH YOUR NAUSEA MEDICATION  *UNUSUAL SHORTNESS OF BREATH  *UNUSUAL BRUISING OR BLEEDING  TENDERNESS IN MOUTH AND THROAT WITH OR WITHOUT PRESENCE OF ULCERS  *URINARY PROBLEMS  *BOWEL PROBLEMS  UNUSUAL RASH Items with * indicate a potential emergency and should be followed up as soon as possible.  Feel free to call the clinic should you have any questions or concerns. The clinic phone number is [phone].  Please show the [place] at check-in to the Emergency Department and triage nurse.

## 2018-12-22 NOTE — Progress Notes (Signed)
[facility] OFFICE PROGRESS NOTE  Christina Moron, MD [address] [place] [address]  DIAGNOSIS: stage IIIB(T4, N2, M0) non-small cell lung cancer, poorly differentiated squamous cell carcinoma. She presented with large right upper lobe lung mass in addition to right hilar and mediastinal lymphadenopathy diagnosed in January 2020.  PRIOR THERAPY: Concurrent chemoradiation with weekly carboplatin for AUC of 2 and paclitaxel 45 mg/M2.  First dose July 13, 2018.  Status post 8 cycles.  Last dose was given August 31, 2018.  CURRENT THERAPY:  Consolidation treatment with immunotherapy with Imfinzi 10 mg/KG every 2 weeks.  First dose starts October 13, 2018.  Status post 5 cycles.  INTERVAL HISTORY: Christina [name] 73 y.o. female returns to the clinic for a follow-up visit.  The patient is feeling well today without any concerning complaints.  She continues to tolerate her treatment with immunotherapy well without any adverse effects. She denies any fever, chills, night sweats, or weight loss.  She denies any chest pain, shortness of breath, cough, or hemoptysis.  She denies any nausea, vomiting, diarrhea, or constipation.  She denies any headache or visual changes.  She denies any rashes or skin changes.  She is here today for evaluation before starting cycle #6.  MEDICAL HISTORY: Past Medical History:  Diagnosis Date  . Allergy   . Cancer ([place])    lung cancer  . GERD (gastroesophageal reflux disease)   . Hyperlipidemia   . Hypertension   . Reflux   . Tubulovillous adenoma of colon 2003    ALLERGIES:  is allergic to coconut oil; codeine; shrimp [shellfish allergy]; and trandolapril.  MEDICATIONS:  Current Outpatient Medications  Medication Sig Dispense Refill  . albuterol (PROAIR HFA) 108 (90 Base) MCG/ACT inhaler Inhale 2 puffs into the lungs every 6 (six) hours as needed for wheezing or shortness of breath. 1 Inhaler 3  . amLODipine  (NORVASC) 10 MG tablet TAKE 1 TABLET(10 MG) BY MOUTH DAILY 90 tablet 3  . aspirin EC 81 MG tablet Take 81 mg by mouth daily.    Marland Kitchen atorvastatin (LIPITOR) 40 MG tablet TAKE 1 TABLET BY MOUTH DAILY 90 tablet 3  . HYDROcodone-acetaminophen (HYCET) 7.5-325 mg/15 ml solution Take 15 mLs by mouth 4 (four) times daily as needed. 473 mL 0  . omeprazole (PRILOSEC) 40 MG capsule TAKE 1 CAPSULE(40 MG) BY MOUTH DAILY 90 capsule 3  . potassium chloride (K-DUR) 10 MEQ tablet Take 1 tablet (10 mEq total) by mouth 2 (two) times daily. 60 tablet 3  . prochlorperazine (COMPAZINE) 10 MG tablet Take 1 tablet (10 mg total) by mouth every 6 (six) hours as needed for nausea or vomiting. 30 tablet 0  . sucralfate (CARAFATE) 1 g tablet TAKE 1 TABLET BY MOUTH 1 HOUR BEFORE MEALS AND AT BEDTIME AS NEEDED (Patient taking differently: Take 1 g by mouth 4 (four) times daily as needed (stomach acid). ) 120 tablet prn   No current facility-administered medications for this visit.    Facility-Administered Medications Ordered in Other Visits  Medication Dose Route Frequency Provider Last Rate Last Dose  . durvalumab (IMFINZI) 780 mg in sodium chloride 0.9 % 100 mL chemo infusion  10 mg/kg (Treatment Plan Recorded) Intravenous Once Christina Bears, MD        SURGICAL HISTORY:  Past Surgical History:  Procedure Laterality Date  . CESAREAN SECTION     1 time  . COLONOSCOPY    . VIDEO BRONCHOSCOPY WITH ENDOBRONCHIAL NAVIGATION  N/A 06/24/2018   Procedure: VIDEO BRONCHOSCOPY WITH ENDOBRONCHIAL NAVIGATION with biopsies, right upper lung lobe;  Surgeon: Melrose Nakayama, MD;  Location: [facility] OR;  Service: Thoracic;  Laterality: N/A;  . VIDEO BRONCHOSCOPY WITH ENDOBRONCHIAL ULTRASOUND N/A 06/24/2018   Procedure: VIDEO BRONCHOSCOPY WITH ENDOBRONCHIAL ULTRASOUND, right upper lung lobe;  Surgeon: Melrose Nakayama, MD;  Location: [facility] OR;  Service: Thoracic;  Laterality: N/A;    REVIEW OF SYSTEMS:   Review of Systems   Constitutional: Negative for appetite change, chills, fatigue, fever and unexpected weight change.  HENT:   Negative for mouth sores, nosebleeds, sore throat and trouble swallowing.   Eyes: Negative for eye problems and icterus.  Respiratory: Negative for cough, hemoptysis, shortness of breath and wheezing.   Cardiovascular: Negative for chest pain and leg swelling.  Gastrointestinal: Negative for abdominal pain, constipation, diarrhea, nausea and vomiting.  Genitourinary: Negative for bladder incontinence, difficulty urinating, dysuria, frequency and hematuria.   Musculoskeletal: Negative for back pain, gait problem, neck pain and neck stiffness.  Skin: Negative for itching and rash.  Neurological: Negative for dizziness, extremity weakness, gait problem, headaches, light-headedness and seizures.  Hematological: Negative for adenopathy. Does not bruise/bleed easily.  Psychiatric/Behavioral: Negative for confusion, depression and sleep disturbance. The patient is not nervous/anxious.     PHYSICAL EXAMINATION:  Blood pressure 121/82, pulse 94, temperature 98.7 F (37.1 C), temperature source Temporal, resp. rate 18, height 5\' 7"  (1.702 m), weight 173 lb 3.2 oz (78.6 kg), SpO2 100 %.  ECOG PERFORMANCE STATUS: 1 - Symptomatic but completely ambulatory  Physical Exam  Constitutional: Oriented to person, place, and time and well-developed, well-nourished, and in no distress.  HENT:  Head: Normocephalic and atraumatic.  Mouth/Throat: Oropharynx is clear and moist. No oropharyngeal exudate.  Eyes: Conjunctivae are normal. Right eye exhibits no discharge. Left eye exhibits no discharge. No scleral icterus.  Neck: Normal range of motion. Neck supple.  Cardiovascular: Normal rate, regular rhythm, normal heart sounds and intact distal pulses.   Pulmonary/Chest: Effort normal and breath sounds normal. No respiratory distress. No wheezes. No rales.  Abdominal: Soft. Bowel sounds are normal.  Exhibits no distension and no mass. There is no tenderness.  Musculoskeletal: Normal range of motion. Exhibits no edema.  Lymphadenopathy:    No cervical adenopathy.  Neurological: Alert and oriented to person, place, and time. Exhibits normal muscle tone. Gait normal. Coordination normal.  Skin: Skin is warm and dry. No rash noted. Not diaphoretic. No erythema. No pallor.  Psychiatric: Mood, memory and judgment normal.  Vitals reviewed.  LABORATORY DATA: Lab Results  Component Value Date   WBC 4.8 12/22/2018   HGB 12.8 12/22/2018   HCT 39.5 12/22/2018   MCV 91.0 12/22/2018   PLT 336 12/22/2018      Chemistry      Component Value Date/Time   NA 140 12/22/2018 1224   NA 144 04/02/2017 1435   K 3.7 12/22/2018 1224   CL 107 12/22/2018 1224   CO2 23 12/22/2018 1224   BUN 14 12/22/2018 1224   BUN 11 04/02/2017 1435   CREATININE 1.03 (H) 12/22/2018 1224   CREATININE 0.87 03/22/2016 0908      Component Value Date/Time   CALCIUM 9.2 12/22/2018 1224   ALKPHOS 121 12/22/2018 1224   AST 38 12/22/2018 1224   ALT 68 (H) 12/22/2018 1224   BILITOT 0.3 12/22/2018 1224       RADIOGRAPHIC STUDIES:  No results found.   ASSESSMENT/PLAN:  This is a very pleasant 73 year old African-American  female diagnosed with stage IIIb (T4, N2, M0) non-small cell lung cancer, poorly differentiated squamous cell carcinoma.  She presented with a right upper lung mass in addition to right hilar and mediastinal lymphadenopathy.  She was diagnosed in January 2020.  The patient previously received concurrent chemoradiation with weekly carboplatin for an AUC of 2 and paclitaxel 45 mg/m.  She is status post 8 cycles.  The patient is currently undergoing consolidation immunotherapy with Imfinzi 10 mg/kg IV every 2 weeks.  She is status post 5 cycles of treatment.  She has been tolerating it well without any adverse side effects.  Labs were reviewed with the patient today.  I recommend that she proceed with  cycle #6 today as scheduled.  I will arrange for the patient to obtain a restaging CT scan of the chest prior to her next visit to reevaluate her disease.  We will see her back for follow-up visit in 2 weeks for evaluation and to review her scan results before proceeding with cycle #7.  The patient was advised to call immediately if she has any concerning symptoms in the interval. The patient voices understanding of current disease status and treatment options and is in agreement with the current care plan. All questions were answered. The patient knows to call the clinic with any problems, questions or concerns. We can certainly see the patient much sooner if necessary   Orders Placed This Encounter  Procedures  . CT Chest W Contrast    Standing Status:   Future    Standing Expiration Date:   12/22/2019    Order Specific Question:   ** REASON FOR EXAM (FREE TEXT)    Answer:   Restaging Lung Cancer    Order Specific Question:   If indicated for the ordered procedure, I authorize the administration of contrast media per Radiology protocol    Answer:   Yes    Order Specific Question:   Preferred imaging location?    Answer:   [facility]    Order Specific Question:   Radiology Contrast Protocol - do NOT remove file path    Answer:   [email or web address]     [place], Christina [name] 12/22/18

## 2018-12-23 ENCOUNTER — Telehealth: Payer: Self-pay | Admitting: Internal Medicine

## 2018-12-23 NOTE — Telephone Encounter (Signed)
Scheduled appt per 7/07 los - pt to get an updated schedule next visit.

## 2019-01-04 ENCOUNTER — Other Ambulatory Visit: Payer: Self-pay

## 2019-01-04 ENCOUNTER — Ambulatory Visit (HOSPITAL_COMMUNITY)
Admission: RE | Admit: 2019-01-04 | Discharge: 2019-01-04 | Disposition: A | Discharge status: Home / Self Care | Payer: BC Managed Care – PPO | Source: Ambulatory Visit | Attending: Physician Assistant | Admitting: Physician Assistant

## 2019-01-04 DIAGNOSIS — C3491 Malignant neoplasm of unspecified part of right bronchus or lung: Secondary | ICD-10-CM | POA: Insufficient documentation

## 2019-01-04 MED ORDER — IOHEXOL 300 MG/ML  SOLN
75.0000 mL | Freq: Once | INTRAMUSCULAR | Status: AC | PRN
  Administered 2019-01-04: 16:00:00 75 mL via INTRAVENOUS

## 2019-01-04 MED ORDER — SODIUM CHLORIDE (PF) 0.9 % IJ SOLN
INTRAMUSCULAR | Status: AC
  Filled 2019-01-04: qty 50

## 2019-01-05 ENCOUNTER — Inpatient Hospital Stay: Payer: BC Managed Care – PPO

## 2019-01-05 ENCOUNTER — Inpatient Hospital Stay (HOSPITAL_BASED_OUTPATIENT_CLINIC_OR_DEPARTMENT_OTHER): Payer: BC Managed Care – PPO | Admitting: Internal Medicine

## 2019-01-05 ENCOUNTER — Other Ambulatory Visit: Payer: Self-pay

## 2019-01-05 ENCOUNTER — Encounter: Payer: Self-pay | Admitting: Internal Medicine

## 2019-01-05 VITALS — BP 126/87 | HR 91 | Temp 98.9°F | Resp 18 | Ht 67.0 in | Wt 175.1 lb

## 2019-01-05 DIAGNOSIS — Z5112 Encounter for antineoplastic immunotherapy: Secondary | ICD-10-CM

## 2019-01-05 DIAGNOSIS — Z923 Personal history of irradiation: Secondary | ICD-10-CM

## 2019-01-05 DIAGNOSIS — Z87891 Personal history of nicotine dependence: Secondary | ICD-10-CM

## 2019-01-05 DIAGNOSIS — Z7982 Long term (current) use of aspirin: Secondary | ICD-10-CM

## 2019-01-05 DIAGNOSIS — C3491 Malignant neoplasm of unspecified part of right bronchus or lung: Secondary | ICD-10-CM

## 2019-01-05 DIAGNOSIS — E785 Hyperlipidemia, unspecified: Secondary | ICD-10-CM

## 2019-01-05 DIAGNOSIS — C3411 Malignant neoplasm of upper lobe, right bronchus or lung: Secondary | ICD-10-CM | POA: Diagnosis not present

## 2019-01-05 DIAGNOSIS — Z79899 Other long term (current) drug therapy: Secondary | ICD-10-CM

## 2019-01-05 DIAGNOSIS — I10 Essential (primary) hypertension: Secondary | ICD-10-CM | POA: Diagnosis not present

## 2019-01-05 LAB — CBC WITH DIFFERENTIAL (CANCER CENTER ONLY)
Abs Immature Granulocytes: 0.02 10*3/uL (ref 0.00–0.07)
Basophils Absolute: 0.1 10*3/uL (ref 0.0–0.1)
Basophils Relative: 1 %
Eosinophils Absolute: 0.1 10*3/uL (ref 0.0–0.5)
Eosinophils Relative: 2 %
HCT: 41.8 % (ref 36.0–46.0)
Hemoglobin: 13.4 g/dL (ref 12.0–15.0)
Immature Granulocytes: 0 %
Lymphocytes Relative: 15 %
Lymphs Abs: 0.7 10*3/uL (ref 0.7–4.0)
MCH: 28.6 pg (ref 26.0–34.0)
MCHC: 32.1 g/dL (ref 30.0–36.0)
MCV: 89.3 fL (ref 80.0–100.0)
Monocytes Absolute: 0.4 10*3/uL (ref 0.1–1.0)
Monocytes Relative: 9 %
Neutro Abs: 3.3 10*3/uL (ref 1.7–7.7)
Neutrophils Relative %: 73 %
Platelet Count: 336 10*3/uL (ref 150–400)
RBC: 4.68 MIL/uL (ref 3.87–5.11)
RDW: 14.5 % (ref 11.5–15.5)
WBC Count: 4.5 10*3/uL (ref 4.0–10.5)
nRBC: 0 % (ref 0.0–0.2)

## 2019-01-05 LAB — CMP (CANCER CENTER ONLY)
ALT: 28 U/L (ref 0–44)
AST: 19 U/L (ref 15–41)
Albumin: 3.7 g/dL (ref 3.5–5.0)
Alkaline Phosphatase: 125 U/L (ref 38–126)
Anion gap: 11 (ref 5–15)
BUN: 15 mg/dL (ref 8–23)
CO2: 24 mmol/L (ref 22–32)
Calcium: 9.6 mg/dL (ref 8.9–10.3)
Chloride: 108 mmol/L (ref 98–111)
Creatinine: 1.01 mg/dL — ABNORMAL HIGH (ref 0.44–1.00)
GFR, Est AFR Am: >60 mL/min
GFR, Estimated: 56 mL/min — ABNORMAL LOW
Glucose, Bld: 94 mg/dL (ref 70–99)
Potassium: 3.5 mmol/L (ref 3.5–5.1)
Sodium: 143 mmol/L (ref 135–145)
Total Bilirubin: 0.4 mg/dL (ref 0.3–1.2)
Total Protein: 7.7 g/dL (ref 6.5–8.1)

## 2019-01-05 LAB — TSH: TSH: 0.911 u[IU]/mL (ref 0.308–3.960)

## 2019-01-05 MED ORDER — SODIUM CHLORIDE 0.9 % IV SOLN
Freq: Once | INTRAVENOUS | Status: AC
  Administered 2019-01-05: 10:00:00 via INTRAVENOUS
  Filled 2019-01-05: qty 250

## 2019-01-05 MED ORDER — SODIUM CHLORIDE 0.9 % IV SOLN
9.5000 mg/kg | Freq: Once | INTRAVENOUS | Status: AC
  Administered 2019-01-05: 12:00:00 740 mg via INTRAVENOUS
  Filled 2019-01-05: qty 10

## 2019-01-05 NOTE — Progress Notes (Signed)
[facility] [phone]   [phone]) [phone]  OFFICE PROGRESS NOTE  [name], MD [address] [facility] [place] [address]  DIAGNOSIS: stage IIIB (T4, N2, M0) non-small cell lung cancer, poorly differentiated squamous cell carcinoma.  She presented with large right upper lobe lung mass in addition to right hilar and mediastinal lymphadenopathy diagnosed in January 2020.  PRIOR THERAPY: Concurrent chemoradiation with weekly carboplatin for AUC of 2 and paclitaxel 45 mg/M2.  First dose July 13, 2018.  Status post 8 cycles.  Last dose was given August 31, 2018.  CURRENT THERAPY: Consolidation treatment with immunotherapy with Imfinzi 10 mg/KG every 2 weeks.  First dose starts October 13, 2018.  Status post 6 cycles.  INTERVAL HISTORY: Christina [name] 73 y.o. female returns to the clinic today for follow-up visit.  The patient is feeling fine today with no concerning complaints.  She denied having any chest pain, shortness of breath, cough or hemoptysis.  She denied having any fever or chills.  She has no nausea, vomiting, diarrhea or constipation.  She denied having any headache or visual changes.  She has been tolerating this treatment fairly well with no concerning complaints.  The patient has no weight loss or night sweats.  She had repeat CT scan of the chest performed recently and she is here for evaluation and discussion of her scan results.  MEDICAL HISTORY: Past Medical History:  Diagnosis Date   Allergy    Cancer ([place])    lung cancer   GERD (gastroesophageal reflux disease)    Hyperlipidemia    Hypertension    Reflux    Tubulovillous adenoma of colon 2003    ALLERGIES:  is allergic to coconut oil; codeine; shrimp [shellfish allergy]; and trandolapril.  MEDICATIONS:  Current Outpatient Medications  Medication Sig Dispense Refill   albuterol (PROAIR HFA) 108 (90 Base) MCG/ACT inhaler Inhale 2 puffs into the  lungs every 6 (six) hours as needed for wheezing or shortness of breath. 1 Inhaler 3   amLODipine (NORVASC) 10 MG tablet TAKE 1 TABLET(10 MG) BY MOUTH DAILY 90 tablet 3   aspirin EC 81 MG tablet Take 81 mg by mouth daily.     atorvastatin (LIPITOR) 40 MG tablet TAKE 1 TABLET BY MOUTH DAILY 90 tablet 3   HYDROcodone-acetaminophen (HYCET) 7.5-325 mg/15 ml solution Take 15 mLs by mouth 4 (four) times daily as needed. 473 mL 0   omeprazole (PRILOSEC) 40 MG capsule TAKE 1 CAPSULE(40 MG) BY MOUTH DAILY 90 capsule 3   potassium chloride (K-DUR) 10 MEQ tablet Take 1 tablet (10 mEq total) by mouth 2 (two) times daily. 60 tablet 3   prochlorperazine (COMPAZINE) 10 MG tablet Take 1 tablet (10 mg total) by mouth every 6 (six) hours as needed for nausea or vomiting. 30 tablet 0   sucralfate (CARAFATE) 1 g tablet TAKE 1 TABLET BY MOUTH 1 HOUR BEFORE MEALS AND AT BEDTIME AS NEEDED (Patient taking differently: Take 1 g by mouth 4 (four) times daily as needed (stomach acid). ) 120 tablet prn   No current facility-administered medications for this visit.     SURGICAL HISTORY:  Past Surgical History:  Procedure Laterality Date   CESAREAN SECTION     1 time   COLONOSCOPY     VIDEO BRONCHOSCOPY WITH ENDOBRONCHIAL NAVIGATION N/A 06/24/2018   Procedure: VIDEO BRONCHOSCOPY WITH ENDOBRONCHIAL NAVIGATION with biopsies, right upper lung lobe;  Surgeon: Melrose Nakayama, MD;  Location: [facility] OR;  Service: Thoracic;  Laterality: N/A;   VIDEO BRONCHOSCOPY WITH ENDOBRONCHIAL ULTRASOUND N/A 06/24/2018   Procedure: VIDEO BRONCHOSCOPY WITH ENDOBRONCHIAL ULTRASOUND, right upper lung lobe;  Surgeon: Melrose Nakayama, MD;  Location: [facility] OR;  Service: Thoracic;  Laterality: N/A;    REVIEW OF SYSTEMS:  Constitutional: negative Eyes: negative Ears, nose, mouth, throat, and face: negative Respiratory: negative Cardiovascular: negative Gastrointestinal: negative Genitourinary:negative Integument/breast:  negative Hematologic/lymphatic: negative Musculoskeletal:negative Neurological: negative Behavioral/Psych: negative Endocrine: negative Allergic/Immunologic: negative   PHYSICAL EXAMINATION: General appearance: alert, cooperative and no distress Head: Normocephalic, without obvious abnormality, atraumatic Neck: no adenopathy, no JVD, supple, symmetrical, trachea midline and thyroid not enlarged, symmetric, no tenderness/mass/nodules Lymph nodes: Cervical, supraclavicular, and axillary nodes normal. Resp: clear to auscultation bilaterally Back: symmetric, no curvature. ROM normal. No CVA tenderness. Cardio: regular rate and rhythm, S1, S2 normal, no murmur, click, rub or gallop GI: soft, non-tender; bowel sounds normal; no masses,  no organomegaly Extremities: extremities normal, atraumatic, no cyanosis or edema Neurologic: Alert and oriented X 3, normal strength and tone. Normal symmetric reflexes. Normal coordination and gait  ECOG PERFORMANCE STATUS: 0 - Asymptomatic  Blood pressure 126/87, pulse 91, temperature 98.9 F (37.2 C), temperature source Oral, resp. rate 18, height 5\' 7"  (1.702 m), weight 175 lb 1.6 oz (79.4 kg), SpO2 99 %.  LABORATORY DATA: Lab Results  Component Value Date   WBC 4.8 12/22/2018   HGB 12.8 12/22/2018   HCT 39.5 12/22/2018   MCV 91.0 12/22/2018   PLT 336 12/22/2018      Chemistry      Component Value Date/Time   NA 140 12/22/2018 1224   NA 144 04/02/2017 1435   K 3.7 12/22/2018 1224   CL 107 12/22/2018 1224   CO2 23 12/22/2018 1224   BUN 14 12/22/2018 1224   BUN 11 04/02/2017 1435   CREATININE 1.03 (H) 12/22/2018 1224   CREATININE 0.87 03/22/2016 0908      Component Value Date/Time   CALCIUM 9.2 12/22/2018 1224   ALKPHOS 121 12/22/2018 1224   AST 38 12/22/2018 1224   ALT 68 (H) 12/22/2018 1224   BILITOT 0.3 12/22/2018 1224       RADIOGRAPHIC STUDIES: Ct Chest W Contrast  Result Date: 01/05/2019 CLINICAL DATA:  Follow-up right  lung cancer diagnosed in December 2019. Chemotherapy and radiation therapy completed with ongoing immunotherapy. Ex-smoker. EXAM: CT CHEST WITH CONTRAST TECHNIQUE: Multidetector CT imaging of the chest was performed during intravenous contrast administration. CONTRAST:  46mL OMNIPAQUE IOHEXOL 300 MG/ML  SOLN COMPARISON:  09/25/2018. FINDINGS: Cardiovascular: Small pericardial effusion with a minimal increase in amount, with a maximum thickness of 14 mm. Normal sized heart. Atheromatous calcifications, including the coronary arteries and aorta. Mediastinum/Nodes: 2.5 cm heterogeneous left lobe thyroid nodule without significant change. An enlarged right hilar lymph node is again demonstrated with a short axis diameter of 10 mm on image number 70 series 2, previously 13 mm in corresponding diameter. No other enlarged lymph nodes are seen. Mild diffuse esophageal wall thickening without significant change. Lungs/Pleura: The previously demonstrated 4.4 x 3.3 cm irregular right upper lobe mass currently measures 4.7 x 1.7 cm in corresponding dimensions on image number 55 series 5. There is a new irregular, contiguous, similar-appearing component more posteriorly and laterally, measuring 2.8 x 1.6 cm on image number 56 series 5. More inferiorly in the right upper lobe is a 2nd irregular part solid nodule measuring 2.4 x 1.3 cm. The lungs remain hyperexpanded with diffuse peribronchial thickening.  No new abnormalities are seen on the left. No pleural fluid. Upper Abdomen: Stable small right lobe liver probable cyst. Musculoskeletal: Mild thoracic and lower cervical spine degenerative changes no evidence of bony metastatic disease. IMPRESSION: 1. Interval mild decrease in size of the previously demonstrated irregular right upper lobe mass. 2. To interval irregular nodular areas in the right upper lobe inferior to the primary mass, 1 measuring 2.8 x 1.6 cm and the other measuring 2.4 x 1.3 cm. These are suspicious for spread  of malignancy. 3. Probable metastatic right hilar lymph node with a mild interval decrease in size. 4. 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 5. Minimal increase in size of a small pericardial effusion. 6. Stable 2.5 cm left lobe thyroid nodule. This could be better evaluated with thyroid ultrasound. If patient is clinically hyperthyroid, consider nuclear medicine thyroid uptake and scan. 7. Calcific coronary artery and aortic atherosclerosis. Aortic Atherosclerosis (ICD10-I70.0) and Emphysema (ICD10-J43.9). Electronically Signed   By: Claudie Revering M.D.   On: 01/05/2019 08:33    ASSESSMENT AND PLAN: This is a very pleasant 73 years old African-American female recently diagnosed with a stage IIIB (T4, N2, M0) non-small cell lung cancer, poorly differentiated squamous cell carcinoma presented with right upper lobe lung mass in addition to right hilar and mediastinal lymphadenopathy. The patient underwent concurrent chemoradiation with weekly carboplatin and paclitaxel status post 8 cycles. She has partial response to this treatment. The patient was started on consolidation treatment with immunotherapy with Imfinzi 10 mg/KG every 2 weeks status post 6 cycles. The patient continues to tolerate this treatment well with no concerning adverse effects. She had repeat CT scan of the chest performed recently.  I personally and independently reviewed the scans and discussed the results and showed the images to the patient today. Her scan showed stable disease with some mild improvement in the mediastinal lymph node but there was 2 areas of the spiculated nodules in the right lung suspicious for disease progression but inflammatory process is also a consideration especially with treatment with immunotherapy. I recommended for the patient to continue her current treatment with Imfinzi for now. I may consider repeating imaging studies in around 2 months for further evaluation of these areas. The patient will  proceed with cycle #7 today. I will see her back for follow-up visit in 2 weeks for evaluation before the next cycle of her treatment. She was advised to call immediately if she has any concerning symptoms in the interval. The patient voices understanding of current disease status and treatment options and is in agreement with the current care plan.  All questions were answered. The patient knows to call the clinic with any problems, questions or concerns. We can certainly see the patient much sooner if necessary.  Disclaimer: This note was dictated with voice recognition software. Similar sounding words can inadvertently be transcribed and may not be corrected upon review.

## 2019-01-05 NOTE — Patient Instructions (Signed)
[facility] Discharge Instructions for Patients Receiving Chemotherapy  Today you received the following chemotherapy agents Durvalumab (IMFINZI).  To help prevent nausea and vomiting after your treatment, we encourage you to take your nausea medication as prescribed.   If you develop nausea and vomiting that is not controlled by your nausea medication, call the clinic.   BELOW ARE SYMPTOMS THAT SHOULD BE REPORTED IMMEDIATELY:  *FEVER GREATER THAN 100.5 F  *CHILLS WITH OR WITHOUT FEVER  NAUSEA AND VOMITING THAT IS NOT CONTROLLED WITH YOUR NAUSEA MEDICATION  *UNUSUAL SHORTNESS OF BREATH  *UNUSUAL BRUISING OR BLEEDING  TENDERNESS IN MOUTH AND THROAT WITH OR WITHOUT PRESENCE OF ULCERS  *URINARY PROBLEMS  *BOWEL PROBLEMS  UNUSUAL RASH Items with * indicate a potential emergency and should be followed up as soon as possible.  Feel free to call the clinic should you have any questions or concerns. The clinic phone number is [phone].  Please show the [facility] at check-in to the Emergency Department and triage nurse.  Coronavirus (COVID-19) Are you at risk?  Are you at risk for the Coronavirus (COVID-19)?  To be considered HIGH RISK for Coronavirus (COVID-19), you have to meet the following criteria:  . Traveled to [place], [place], [place], [place] or [place]; or in the [place] to [place], [place], [place], or [place]; and have fever, cough, and shortness of breath within the last 2 weeks of travel OR . Been in close contact with a person diagnosed with COVID-19 within the last 2 weeks and have fever, cough, and shortness of breath . IF YOU DO NOT MEET THESE CRITERIA, YOU ARE CONSIDERED LOW RISK FOR COVID-19.  What to do if you are HIGH RISK for COVID-19?  Marland Kitchen If you are having a medical emergency, call 911. . Seek medical care right away. Before you go to a doctor's office, urgent care or emergency department, call ahead and tell them  about your recent travel, contact with someone diagnosed with COVID-19, and your symptoms. You should receive instructions from your physician's office regarding next steps of care.  . When you arrive at healthcare provider, tell the healthcare staff immediately you have returned from visiting [place], [place], [place], [place] or [place]; or traveled in the [place] to [place], [place], [place], or [place]; in the last two weeks or you have been in close contact with a person diagnosed with COVID-19 in the last 2 weeks.   . Tell the health care staff about your symptoms: fever, cough and shortness of breath. . After you have been seen by a medical provider, you will be either: o Tested for (COVID-19) and discharged home on quarantine except to seek medical care if symptoms worsen, and asked to  - Stay home and avoid contact with others until you get your results (4-5 days)  - Avoid travel on public transportation if possible (such as bus, train, or airplane) or o Sent to the Emergency Department by EMS for evaluation, COVID-19 testing, and possible admission depending on your condition and test results.  What to do if you are LOW RISK for COVID-19?  Reduce your risk of any infection by using the same precautions used for avoiding the common cold or flu:  Marland Kitchen Wash your hands often with soap and warm water for at least 20 seconds.  If soap and water are not readily available, use an alcohol-based hand sanitizer with at least 60% alcohol.  . If coughing or  sneezing, cover your mouth and nose by coughing or sneezing into the elbow areas of your shirt or coat, into a tissue or into your sleeve (not your hands). . Avoid shaking hands with others and consider head nods or verbal greetings only. . Avoid touching your eyes, nose, or mouth with unwashed hands.  . Avoid close contact with people who are sick. . Avoid places or events with large numbers of people in one location, like concerts or  sporting events. . Carefully consider travel plans you have or are making. . If you are planning any travel outside or inside the [place], visit the CDC's Travelers' Health webpage for the latest health notices. . If you have some symptoms but not all symptoms, continue to monitor at home and seek medical attention if your symptoms worsen. . If you are having a medical emergency, call 911.   [facility] [place] / e-Visit: [email or web address]         [facility]: [facility]: [phone]                   [facility]: [phone]

## 2019-01-18 ENCOUNTER — Other Ambulatory Visit: Payer: Self-pay

## 2019-01-19 ENCOUNTER — Telehealth: Payer: Self-pay | Admitting: Physician Assistant

## 2019-01-19 ENCOUNTER — Inpatient Hospital Stay: Payer: BC Managed Care – PPO | Attending: Internal Medicine

## 2019-01-19 ENCOUNTER — Inpatient Hospital Stay (HOSPITAL_BASED_OUTPATIENT_CLINIC_OR_DEPARTMENT_OTHER): Payer: BC Managed Care – PPO | Admitting: Physician Assistant

## 2019-01-19 ENCOUNTER — Inpatient Hospital Stay: Payer: BC Managed Care – PPO

## 2019-01-19 ENCOUNTER — Other Ambulatory Visit: Payer: Self-pay

## 2019-01-19 VITALS — BP 117/83 | HR 100 | Temp 98.2°F | Resp 18 | Ht 67.0 in | Wt 174.8 lb

## 2019-01-19 DIAGNOSIS — Z5112 Encounter for antineoplastic immunotherapy: Secondary | ICD-10-CM | POA: Diagnosis not present

## 2019-01-19 DIAGNOSIS — C3491 Malignant neoplasm of unspecified part of right bronchus or lung: Secondary | ICD-10-CM | POA: Diagnosis not present

## 2019-01-19 DIAGNOSIS — I10 Essential (primary) hypertension: Secondary | ICD-10-CM | POA: Diagnosis not present

## 2019-01-19 DIAGNOSIS — C3411 Malignant neoplasm of upper lobe, right bronchus or lung: Secondary | ICD-10-CM | POA: Diagnosis not present

## 2019-01-19 DIAGNOSIS — Z923 Personal history of irradiation: Secondary | ICD-10-CM | POA: Diagnosis not present

## 2019-01-19 DIAGNOSIS — Z87891 Personal history of nicotine dependence: Secondary | ICD-10-CM | POA: Insufficient documentation

## 2019-01-19 DIAGNOSIS — Z79899 Other long term (current) drug therapy: Secondary | ICD-10-CM | POA: Diagnosis not present

## 2019-01-19 DIAGNOSIS — Z7982 Long term (current) use of aspirin: Secondary | ICD-10-CM | POA: Insufficient documentation

## 2019-01-19 DIAGNOSIS — E785 Hyperlipidemia, unspecified: Secondary | ICD-10-CM | POA: Diagnosis not present

## 2019-01-19 LAB — CMP (CANCER CENTER ONLY)
ALT: 15 U/L (ref 0–44)
AST: 15 U/L (ref 15–41)
Albumin: 3.6 g/dL (ref 3.5–5.0)
Alkaline Phosphatase: 112 U/L (ref 38–126)
Anion gap: 8 (ref 5–15)
BUN: 14 mg/dL (ref 8–23)
CO2: 25 mmol/L (ref 22–32)
Calcium: 9.4 mg/dL (ref 8.9–10.3)
Chloride: 106 mmol/L (ref 98–111)
Creatinine: 0.99 mg/dL (ref 0.44–1.00)
GFR, Est AFR Am: >60 mL/min (ref >60)
GFR, Estimated: 57 mL/min — ABNORMAL LOW (ref >60)
Glucose, Bld: 109 mg/dL — ABNORMAL HIGH (ref 70–99)
Potassium: 3.5 mmol/L (ref 3.5–5.1)
Sodium: 139 mmol/L (ref 135–145)
Total Bilirubin: 0.4 mg/dL (ref 0.3–1.2)
Total Protein: 7.2 g/dL (ref 6.5–8.1)

## 2019-01-19 LAB — CBC WITH DIFFERENTIAL (CANCER CENTER ONLY)
Abs Immature Granulocytes: 0.02 10*3/uL (ref 0.00–0.07)
Basophils Absolute: 0.1 10*3/uL (ref 0.0–0.1)
Basophils Relative: 1 %
Eosinophils Absolute: 0 10*3/uL (ref 0.0–0.5)
Eosinophils Relative: 1 %
HCT: 40.6 % (ref 36.0–46.0)
Hemoglobin: 13.3 g/dL (ref 12.0–15.0)
Immature Granulocytes: 0 %
Lymphocytes Relative: 17 %
Lymphs Abs: 1 10*3/uL (ref 0.7–4.0)
MCH: 28.8 pg (ref 26.0–34.0)
MCHC: 32.8 g/dL (ref 30.0–36.0)
MCV: 87.9 fL (ref 80.0–100.0)
Monocytes Absolute: 0.5 10*3/uL (ref 0.1–1.0)
Monocytes Relative: 8 %
Neutro Abs: 4.3 10*3/uL (ref 1.7–7.7)
Neutrophils Relative %: 73 %
Platelet Count: 361 10*3/uL (ref 150–400)
RBC: 4.62 MIL/uL (ref 3.87–5.11)
RDW: 14.4 % (ref 11.5–15.5)
WBC Count: 6 10*3/uL (ref 4.0–10.5)
nRBC: 0 % (ref 0.0–0.2)

## 2019-01-19 MED ORDER — SODIUM CHLORIDE 0.9 % IV SOLN
Freq: Once | INTRAVENOUS | Status: AC
  Administered 2019-01-19: 13:00:00 via INTRAVENOUS
  Filled 2019-01-19: qty 250

## 2019-01-19 MED ORDER — SODIUM CHLORIDE 0.9 % IV SOLN
9.5000 mg/kg | Freq: Once | INTRAVENOUS | Status: AC
  Administered 2019-01-19: 740 mg via INTRAVENOUS
  Filled 2019-01-19: qty 10

## 2019-01-19 NOTE — Progress Notes (Signed)
[facility] OFFICE PROGRESS NOTE  [name], MD [address] [facility] [place] [address]  DIAGNOSIS: Stage IIIB(T4, N2, M0) non-small cell lung cancer, poorly differentiated squamous cell carcinoma. She presented with large right upper lobe lung mass in addition to right hilar and mediastinal lymphadenopathy diagnosed in January 2020.  PRIOR THERAPY: Concurrent chemoradiation with weekly carboplatin for AUC of 2 and paclitaxel 45 mg/M2. First dose July 13, 2018. Status post 8 cycles. Last dose was given August 31, 2018.  CURRENT THERAPY: Consolidation treatment with immunotherapy with Imfinzi 10 mg/KG every 2 weeks. First dose starts October 13, 2018. Status post 7cycles.  INTERVAL HISTORY: Christina [name] 73 y.o. female returns to the clinic for a follow-up visit.  The patient is feeling well today without any concerning complaints.  She continues to tolerate her treatment with immunotherapy well without any adverse effects. She denies any fever, chills, night sweats, or weight loss.  She denies any chest pain, shortness of breath, cough, or hemoptysis.  She denies any nausea, vomiting, diarrhea, or constipation.  She denies any headache or visual changes.  She denies any rashes or skin changes.  She is here today for evaluation before starting cycle #8.   MEDICAL HISTORY: Past Medical History:  Diagnosis Date  . Allergy   . Cancer ([facility])    lung cancer  . GERD (gastroesophageal reflux disease)   . Hyperlipidemia   . Hypertension   . Reflux   . Tubulovillous adenoma of colon 2003    ALLERGIES:  is allergic to coconut oil; codeine; shrimp [shellfish allergy]; and trandolapril.  MEDICATIONS:  Current Outpatient Medications  Medication Sig Dispense Refill  . albuterol (PROAIR HFA) 108 (90 Base) MCG/ACT inhaler Inhale 2 puffs into the lungs every 6 (six) hours as needed for wheezing or shortness of breath. 1 Inhaler 3  . amLODipine  (NORVASC) 10 MG tablet TAKE 1 TABLET(10 MG) BY MOUTH DAILY 90 tablet 3  . aspirin EC 81 MG tablet Take 81 mg by mouth daily.    Marland Kitchen atorvastatin (LIPITOR) 40 MG tablet TAKE 1 TABLET BY MOUTH DAILY 90 tablet 3  . omeprazole (PRILOSEC) 40 MG capsule TAKE 1 CAPSULE(40 MG) BY MOUTH DAILY 90 capsule 3  . potassium chloride (K-DUR) 10 MEQ tablet Take 1 tablet (10 mEq total) by mouth 2 (two) times daily. 60 tablet 3  . sucralfate (CARAFATE) 1 g tablet TAKE 1 TABLET BY MOUTH 1 HOUR BEFORE MEALS AND AT BEDTIME AS NEEDED (Patient taking differently: Take 1 g by mouth 4 (four) times daily as needed (stomach acid). ) 120 tablet prn  . HYDROcodone-acetaminophen (HYCET) 7.5-325 mg/15 ml solution Take 15 mLs by mouth 4 (four) times daily as needed. (Patient not taking: Reported on 01/05/2019) 473 mL 0  . prochlorperazine (COMPAZINE) 10 MG tablet Take 1 tablet (10 mg total) by mouth every 6 (six) hours as needed for nausea or vomiting. (Patient not taking: Reported on 01/19/2019) 30 tablet 0   No current facility-administered medications for this visit.     SURGICAL HISTORY:  Past Surgical History:  Procedure Laterality Date  . CESAREAN SECTION     1 time  . COLONOSCOPY    . VIDEO BRONCHOSCOPY WITH ENDOBRONCHIAL NAVIGATION N/A 06/24/2018   Procedure: VIDEO BRONCHOSCOPY WITH ENDOBRONCHIAL NAVIGATION with biopsies, right upper lung lobe;  Surgeon: Melrose Nakayama, MD;  Location: [facility] OR;  Service: Thoracic;  Laterality: N/A;  . VIDEO BRONCHOSCOPY WITH ENDOBRONCHIAL ULTRASOUND N/A 06/24/2018  Procedure: VIDEO BRONCHOSCOPY WITH ENDOBRONCHIAL ULTRASOUND, right upper lung lobe;  Surgeon: Melrose Nakayama, MD;  Location: [facility] OR;  Service: Thoracic;  Laterality: N/A;    REVIEW OF SYSTEMS:   Review of Systems  Constitutional: Negative for appetite change, chills, fatigue, fever and unexpected weight change.  HENT:   Negative for mouth sores, nosebleeds, sore throat and trouble swallowing.   Eyes: Negative for eye  problems and icterus.  Respiratory: Negative for cough, hemoptysis, shortness of breath and wheezing.   Cardiovascular: Negative for chest pain and leg swelling.  Gastrointestinal: Negative for abdominal pain, constipation, diarrhea, nausea and vomiting.  Genitourinary: Negative for bladder incontinence, difficulty urinating, dysuria, frequency and hematuria.   Musculoskeletal: Negative for back pain, gait problem, neck pain and neck stiffness.  Skin: Negative for itching and rash.  Neurological: Negative for dizziness, extremity weakness, gait problem, headaches, light-headedness and seizures.  Hematological: Negative for adenopathy. Does not bruise/bleed easily.  Psychiatric/Behavioral: Negative for confusion, depression and sleep disturbance. The patient is not nervous/anxious.     PHYSICAL EXAMINATION:  Blood pressure 117/83, pulse 100, temperature 98.2 F (36.8 C), temperature source Oral, resp. rate 18, height 5\' 7"  (1.702 m), weight 174 lb 12.8 oz (79.3 kg), SpO2 99 %.  ECOG PERFORMANCE STATUS: 1 - Symptomatic but completely ambulatory  Physical Exam  Constitutional: Oriented to person, place, and time and well-developed, well-nourished, and in no distress.  HENT:  Head: Normocephalic and atraumatic.  Mouth/Throat: Oropharynx is clear and moist. No oropharyngeal exudate.  Eyes: Conjunctivae are normal. Right eye exhibits no discharge. Left eye exhibits no discharge. No scleral icterus.  Neck: Normal range of motion. Neck supple.  Cardiovascular: Normal rate, regular rhythm, normal heart sounds and intact distal pulses.   Pulmonary/Chest: Effort normal and breath sounds normal. No respiratory distress. No wheezes. No rales.  Abdominal: Soft. Bowel sounds are normal. Exhibits no distension and no mass. There is no tenderness.  Musculoskeletal: Normal range of motion. Exhibits no edema.  Lymphadenopathy:    No cervical adenopathy.  Neurological: Alert and oriented to person, place,  and time. Exhibits normal muscle tone. Gait normal. Coordination normal.  Skin: Skin is warm and dry. No rash noted. Not diaphoretic. No erythema. No pallor.  Psychiatric: Mood, memory and judgment normal.  Vitals reviewed.  LABORATORY DATA: Lab Results  Component Value Date   WBC 6.0 01/19/2019   HGB 13.3 01/19/2019   HCT 40.6 01/19/2019   MCV 87.9 01/19/2019   PLT 361 01/19/2019      Chemistry      Component Value Date/Time   NA 139 01/19/2019 1143   NA 144 04/02/2017 1435   K 3.5 01/19/2019 1143   CL 106 01/19/2019 1143   CO2 25 01/19/2019 1143   BUN 14 01/19/2019 1143   BUN 11 04/02/2017 1435   CREATININE 0.99 01/19/2019 1143   CREATININE 0.87 03/22/2016 0908      Component Value Date/Time   CALCIUM 9.4 01/19/2019 1143   ALKPHOS 112 01/19/2019 1143   AST 15 01/19/2019 1143   ALT 15 01/19/2019 1143   BILITOT 0.4 01/19/2019 1143       RADIOGRAPHIC STUDIES:  Ct Chest W Contrast  Result Date: 01/05/2019 CLINICAL DATA:  Follow-up right lung cancer diagnosed in December 2019. Chemotherapy and radiation therapy completed with ongoing immunotherapy. Ex-smoker. EXAM: CT CHEST WITH CONTRAST TECHNIQUE: Multidetector CT imaging of the chest was performed during intravenous contrast administration. CONTRAST:  73mL OMNIPAQUE IOHEXOL 300 MG/ML  SOLN COMPARISON:  09/25/2018. FINDINGS:  Cardiovascular: Small pericardial effusion with a minimal increase in amount, with a maximum thickness of 14 mm. Normal sized heart. Atheromatous calcifications, including the coronary arteries and aorta. Mediastinum/Nodes: 2.5 cm heterogeneous left lobe thyroid nodule without significant change. An enlarged right hilar lymph node is again demonstrated with a short axis diameter of 10 mm on image number 70 series 2, previously 13 mm in corresponding diameter. No other enlarged lymph nodes are seen. Mild diffuse esophageal wall thickening without significant change. Lungs/Pleura: The previously demonstrated  4.4 x 3.3 cm irregular right upper lobe mass currently measures 4.7 x 1.7 cm in corresponding dimensions on image number 55 series 5. There is a new irregular, contiguous, similar-appearing component more posteriorly and laterally, measuring 2.8 x 1.6 cm on image number 56 series 5. More inferiorly in the right upper lobe is a 2nd irregular part solid nodule measuring 2.4 x 1.3 cm. The lungs remain hyperexpanded with diffuse peribronchial thickening. No new abnormalities are seen on the left. No pleural fluid. Upper Abdomen: Stable small right lobe liver probable cyst. Musculoskeletal: Mild thoracic and lower cervical spine degenerative changes no evidence of bony metastatic disease. IMPRESSION: 1. Interval mild decrease in size of the previously demonstrated irregular right upper lobe mass. 2. To interval irregular nodular areas in the right upper lobe inferior to the primary mass, 1 measuring 2.8 x 1.6 cm and the other measuring 2.4 x 1.3 cm. These are suspicious for spread of malignancy. 3. Probable metastatic right hilar lymph node with a mild interval decrease in size. 4. 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 5. Minimal increase in size of a small pericardial effusion. 6. Stable 2.5 cm left lobe thyroid nodule. This could be better evaluated with thyroid ultrasound. If patient is clinically hyperthyroid, consider nuclear medicine thyroid uptake and scan. 7. Calcific coronary artery and aortic atherosclerosis. Aortic Atherosclerosis (ICD10-I70.0) and Emphysema (ICD10-J43.9). Electronically Signed   By: Claudie Revering M.D.   On: 01/05/2019 08:33     ASSESSMENT/PLAN:  This is a very pleasant 73 year old African-American female diagnosed with stage IIIb (T4, N2, M0) non-small cell lung cancer, poorly differentiated squamous cell carcinoma.  She presented with a right upper lung mass in addition to right hilar and mediastinal lymphadenopathy.  She was diagnosed in January 2020.  The patient previously  received concurrent chemoradiation with weekly carboplatin for an AUC of 2 and paclitaxel 45 mg/m.  She is status post 8 cycles.  The patient is currently undergoing consolidation immunotherapy with Imfinzi 10 mg/kg IV every 2 weeks.  She is status post 7 cycles of treatment.  She has been tolerating it well without any adverse side effects.  Labs were reviewed with the patient today.  I recommend that she proceed with cycle #8 today as scheduled.  We will see her back for follow-up visit in 2 weeks for evaluation and to review her scan results before proceeding with cycle #9.  The patient was advised to call immediately if she has any concerning symptoms in the interval. The patient voices understanding of current disease status and treatment options and is in agreement with the current care plan. All questions were answered. The patient knows to call the clinic with any problems, questions or concerns. We can certainly see the patient much sooner if necessary  No orders of the defined types were placed in this encounter.    Antonetta Clanton L Yong Wahlquist, PA-C 01/19/19

## 2019-01-19 NOTE — Patient Instructions (Signed)
[facility] Discharge Instructions for Patients Receiving Chemotherapy  Today you received the following chemotherapy agents Durvalumab (IMFINZI).  To help prevent nausea and vomiting after your treatment, we encourage you to take your nausea medication as prescribed.   If you develop nausea and vomiting that is not controlled by your nausea medication, call the clinic.   BELOW ARE SYMPTOMS THAT SHOULD BE REPORTED IMMEDIATELY:  *FEVER GREATER THAN 100.5 F  *CHILLS WITH OR WITHOUT FEVER  NAUSEA AND VOMITING THAT IS NOT CONTROLLED WITH YOUR NAUSEA MEDICATION  *UNUSUAL SHORTNESS OF BREATH  *UNUSUAL BRUISING OR BLEEDING  TENDERNESS IN MOUTH AND THROAT WITH OR WITHOUT PRESENCE OF ULCERS  *URINARY PROBLEMS  *BOWEL PROBLEMS  UNUSUAL RASH Items with * indicate a potential emergency and should be followed up as soon as possible.  Feel free to call the clinic should you have any questions or concerns. The clinic phone number is [phone].  Please show the [facility] at check-in to the Emergency Department and triage nurse.  Coronavirus (COVID-19) Are you at risk?  Are you at risk for the Coronavirus (COVID-19)?  To be considered HIGH RISK for Coronavirus (COVID-19), you have to meet the following criteria:  . Traveled to [place], [place], [place], [place] or [place]; or in the [place] to [place], [place], [place], or [place]; and have fever, cough, and shortness of breath within the last 2 weeks of travel OR . Been in close contact with a person diagnosed with COVID-19 within the last 2 weeks and have fever, cough, and shortness of breath . IF YOU DO NOT MEET THESE CRITERIA, YOU ARE CONSIDERED LOW RISK FOR COVID-19.  What to do if you are HIGH RISK for COVID-19?  Marland Kitchen If you are having a medical emergency, call 911. . Seek medical care right away. Before you go to a doctor's office, urgent care or emergency department, call ahead and tell them  about your recent travel, contact with someone diagnosed with COVID-19, and your symptoms. You should receive instructions from your physician's office regarding next steps of care.  . When you arrive at healthcare provider, tell the healthcare staff immediately you have returned from visiting [place], [place], [place], [place] or [place]; or traveled in the [place] to [place], [place], [place], or [place]; in the last two weeks or you have been in close contact with a person diagnosed with COVID-19 in the last 2 weeks.   . Tell the health care staff about your symptoms: fever, cough and shortness of breath. . After you have been seen by a medical provider, you will be either: o Tested for (COVID-19) and discharged home on quarantine except to seek medical care if symptoms worsen, and asked to  - Stay home and avoid contact with others until you get your results (4-5 days)  - Avoid travel on public transportation if possible (such as bus, train, or airplane) or o Sent to the Emergency Department by EMS for evaluation, COVID-19 testing, and possible admission depending on your condition and test results.  What to do if you are LOW RISK for COVID-19?  Reduce your risk of any infection by using the same precautions used for avoiding the common cold or flu:  Marland Kitchen Wash your hands often with soap and warm water for at least 20 seconds.  If soap and water are not readily available, use an alcohol-based hand sanitizer with at least 60% alcohol.  . If coughing or  sneezing, cover your mouth and nose by coughing or sneezing into the elbow areas of your shirt or coat, into a tissue or into your sleeve (not your hands). . Avoid shaking hands with others and consider head nods or verbal greetings only. . Avoid touching your eyes, nose, or mouth with unwashed hands.  . Avoid close contact with people who are sick. . Avoid places or events with large numbers of people in one location, like concerts or  sporting events. . Carefully consider travel plans you have or are making. . If you are planning any travel outside or inside the [place], visit the CDC's Travelers' Health webpage for the latest health notices. . If you have some symptoms but not all symptoms, continue to monitor at home and seek medical attention if your symptoms worsen. . If you are having a medical emergency, call 911.   [place] / e-Visit: [email or web address]         [facility]: [facility]: [phone]                   [facility]: [phone]

## 2019-01-19 NOTE — Telephone Encounter (Signed)
No 8/04 LOS

## 2019-02-02 ENCOUNTER — Telehealth: Payer: Self-pay | Admitting: Internal Medicine

## 2019-02-02 ENCOUNTER — Encounter: Payer: Self-pay | Admitting: Internal Medicine

## 2019-02-02 ENCOUNTER — Inpatient Hospital Stay: Payer: BC Managed Care – PPO

## 2019-02-02 ENCOUNTER — Other Ambulatory Visit: Payer: Self-pay

## 2019-02-02 ENCOUNTER — Inpatient Hospital Stay (HOSPITAL_BASED_OUTPATIENT_CLINIC_OR_DEPARTMENT_OTHER): Payer: BC Managed Care – PPO | Admitting: Internal Medicine

## 2019-02-02 VITALS — BP 122/84 | HR 86 | Temp 98.2°F | Resp 18 | Ht 67.0 in | Wt 174.7 lb

## 2019-02-02 DIAGNOSIS — C3491 Malignant neoplasm of unspecified part of right bronchus or lung: Secondary | ICD-10-CM

## 2019-02-02 DIAGNOSIS — C3411 Malignant neoplasm of upper lobe, right bronchus or lung: Secondary | ICD-10-CM | POA: Diagnosis not present

## 2019-02-02 DIAGNOSIS — E785 Hyperlipidemia, unspecified: Secondary | ICD-10-CM | POA: Diagnosis not present

## 2019-02-02 DIAGNOSIS — Z79899 Other long term (current) drug therapy: Secondary | ICD-10-CM | POA: Diagnosis not present

## 2019-02-02 DIAGNOSIS — Z5112 Encounter for antineoplastic immunotherapy: Secondary | ICD-10-CM

## 2019-02-02 DIAGNOSIS — Z923 Personal history of irradiation: Secondary | ICD-10-CM | POA: Diagnosis not present

## 2019-02-02 DIAGNOSIS — Z7982 Long term (current) use of aspirin: Secondary | ICD-10-CM | POA: Diagnosis not present

## 2019-02-02 DIAGNOSIS — I10 Essential (primary) hypertension: Secondary | ICD-10-CM | POA: Diagnosis not present

## 2019-02-02 DIAGNOSIS — Z87891 Personal history of nicotine dependence: Secondary | ICD-10-CM | POA: Diagnosis not present

## 2019-02-02 LAB — CBC WITH DIFFERENTIAL (CANCER CENTER ONLY)
Abs Immature Granulocytes: 0.03 10*3/uL (ref 0.00–0.07)
Basophils Absolute: 0.1 10*3/uL (ref 0.0–0.1)
Basophils Relative: 1 %
Eosinophils Absolute: 0.1 10*3/uL (ref 0.0–0.5)
Eosinophils Relative: 2 %
HCT: 40.5 % (ref 36.0–46.0)
Hemoglobin: 13.1 g/dL (ref 12.0–15.0)
Immature Granulocytes: 1 %
Lymphocytes Relative: 22 %
Lymphs Abs: 1 10*3/uL (ref 0.7–4.0)
MCH: 28.5 pg (ref 26.0–34.0)
MCHC: 32.3 g/dL (ref 30.0–36.0)
MCV: 88.2 fL (ref 80.0–100.0)
Monocytes Absolute: 0.5 10*3/uL (ref 0.1–1.0)
Monocytes Relative: 10 %
Neutro Abs: 3 10*3/uL (ref 1.7–7.7)
Neutrophils Relative %: 64 %
Platelet Count: 367 10*3/uL (ref 150–400)
RBC: 4.59 MIL/uL (ref 3.87–5.11)
RDW: 14.5 % (ref 11.5–15.5)
WBC Count: 4.6 10*3/uL (ref 4.0–10.5)
nRBC: 0 % (ref 0.0–0.2)

## 2019-02-02 LAB — CMP (CANCER CENTER ONLY)
ALT: 13 U/L (ref 0–44)
AST: 15 U/L (ref 15–41)
Albumin: 3.7 g/dL (ref 3.5–5.0)
Alkaline Phosphatase: 115 U/L (ref 38–126)
Anion gap: 11 (ref 5–15)
BUN: 12 mg/dL (ref 8–23)
CO2: 25 mmol/L (ref 22–32)
Calcium: 9.3 mg/dL (ref 8.9–10.3)
Chloride: 105 mmol/L (ref 98–111)
Creatinine: 0.92 mg/dL (ref 0.44–1.00)
GFR, Est AFR Am: >60 mL/min (ref >60)
GFR, Estimated: >60 mL/min (ref >60)
Glucose, Bld: 90 mg/dL (ref 70–99)
Potassium: 3.4 mmol/L — ABNORMAL LOW (ref 3.5–5.1)
Sodium: 141 mmol/L (ref 135–145)
Total Bilirubin: 0.5 mg/dL (ref 0.3–1.2)
Total Protein: 7.5 g/dL (ref 6.5–8.1)

## 2019-02-02 LAB — TSH: TSH: 1.183 u[IU]/mL (ref 0.308–3.960)

## 2019-02-02 MED ORDER — SODIUM CHLORIDE 0.9 % IV SOLN
9.5000 mg/kg | Freq: Once | INTRAVENOUS | Status: AC
  Administered 2019-02-02: 740 mg via INTRAVENOUS
  Filled 2019-02-02: qty 10

## 2019-02-02 MED ORDER — SODIUM CHLORIDE 0.9 % IV SOLN
Freq: Once | INTRAVENOUS | Status: AC
  Administered 2019-02-02: 14:00:00 via INTRAVENOUS
  Filled 2019-02-02: qty 250

## 2019-02-02 NOTE — Patient Instructions (Signed)
[facility] Discharge Instructions for Patients Receiving Chemotherapy  Today you received the following chemotherapy agents: Imfinzi.  To help prevent nausea and vomiting after your treatment, we encourage you to take your nausea medication as directed.   If you develop nausea and vomiting that is not controlled by your nausea medication, call the clinic.   BELOW ARE SYMPTOMS THAT SHOULD BE REPORTED IMMEDIATELY:  *FEVER GREATER THAN 100.5 F  *CHILLS WITH OR WITHOUT FEVER  NAUSEA AND VOMITING THAT IS NOT CONTROLLED WITH YOUR NAUSEA MEDICATION  *UNUSUAL SHORTNESS OF BREATH  *UNUSUAL BRUISING OR BLEEDING  TENDERNESS IN MOUTH AND THROAT WITH OR WITHOUT PRESENCE OF ULCERS  *URINARY PROBLEMS  *BOWEL PROBLEMS  UNUSUAL RASH Items with * indicate a potential emergency and should be followed up as soon as possible.  Feel free to call the clinic should you have any questions or concerns. The clinic phone number is [phone].  Please show the CHEMO ALERT CARD at check-in to the Emergency Department and triage nurse.   

## 2019-02-02 NOTE — Telephone Encounter (Signed)
Scheduled appt per 8/18 los - pt to get an updated schedule next visit.

## 2019-02-02 NOTE — Progress Notes (Signed)
[place] Telephone:([phone]) [phone]   Fax:(336) [phone]  OFFICE PROGRESS NOTE  [name], MD [place] [address] [facility] [place] [address]  DIAGNOSIS: stage IIIB (T4, N2, M0) non-small cell lung cancer, poorly differentiated squamous cell carcinoma.  She presented with large right upper lobe lung mass in addition to right hilar and mediastinal lymphadenopathy diagnosed in January 2020.  PRIOR THERAPY: Concurrent chemoradiation with weekly carboplatin for AUC of 2 and paclitaxel 45 mg/M2.  First dose July 13, 2018.  Status post 8 cycles.  Last dose was given August 31, 2018.  CURRENT THERAPY: Consolidation treatment with immunotherapy with Imfinzi 10 mg/KG every 2 weeks.  First dose starts October 13, 2018.  Status post 8 cycles.  INTERVAL HISTORY: Christina [name] 73 y.o. female returns to the clinic today for follow-up visit.  The patient is feeling fine today with no concerning complaints.  She denied having any chest pain, shortness of breath, cough or hemoptysis.  She has no skin rash or itching.  She denied having any nausea, vomiting, diarrhea or constipation.  She has no recent weight loss or night sweats.  She has been tolerating her treatment with Imfinzi fairly well.  She is here today for evaluation before starting cycle #9.   MEDICAL HISTORY: Past Medical History:  Diagnosis Date   Allergy    Cancer ([place])    lung cancer   GERD (gastroesophageal reflux disease)    Hyperlipidemia    Hypertension    Reflux    Tubulovillous adenoma of colon 2003    ALLERGIES:  is allergic to coconut oil; codeine; shrimp [shellfish allergy]; and trandolapril.  MEDICATIONS:  Current Outpatient Medications  Medication Sig Dispense Refill   albuterol (PROAIR HFA) 108 (90 Base) MCG/ACT inhaler Inhale 2 puffs into the lungs every 6 (six) hours as needed for wheezing or shortness of breath. 1 Inhaler 3   amLODipine (NORVASC) 10 MG tablet  TAKE 1 TABLET(10 MG) BY MOUTH DAILY 90 tablet 3   aspirin EC 81 MG tablet Take 81 mg by mouth daily.     atorvastatin (LIPITOR) 40 MG tablet TAKE 1 TABLET BY MOUTH DAILY 90 tablet 3   HYDROcodone-acetaminophen (HYCET) 7.5-325 mg/15 ml solution Take 15 mLs by mouth 4 (four) times daily as needed. (Patient not taking: Reported on 01/05/2019) 473 mL 0   omeprazole (PRILOSEC) 40 MG capsule TAKE 1 CAPSULE(40 MG) BY MOUTH DAILY 90 capsule 3   potassium chloride (K-DUR) 10 MEQ tablet Take 1 tablet (10 mEq total) by mouth 2 (two) times daily. 60 tablet 3   prochlorperazine (COMPAZINE) 10 MG tablet Take 1 tablet (10 mg total) by mouth every 6 (six) hours as needed for nausea or vomiting. (Patient not taking: Reported on 01/19/2019) 30 tablet 0   sucralfate (CARAFATE) 1 g tablet TAKE 1 TABLET BY MOUTH 1 HOUR BEFORE MEALS AND AT BEDTIME AS NEEDED (Patient taking differently: Take 1 g by mouth 4 (four) times daily as needed (stomach acid). ) 120 tablet prn   No current facility-administered medications for this visit.     SURGICAL HISTORY:  Past Surgical History:  Procedure Laterality Date   CESAREAN SECTION     1 time   COLONOSCOPY     VIDEO BRONCHOSCOPY WITH ENDOBRONCHIAL NAVIGATION N/A 06/24/2018   Procedure: VIDEO BRONCHOSCOPY WITH ENDOBRONCHIAL NAVIGATION with biopsies, right upper lung lobe;  Surgeon: Melrose Nakayama, MD;  Location: [place];  Service: Thoracic;  Laterality: N/A;  VIDEO BRONCHOSCOPY WITH ENDOBRONCHIAL ULTRASOUND N/A 06/24/2018   Procedure: VIDEO BRONCHOSCOPY WITH ENDOBRONCHIAL ULTRASOUND, right upper lung lobe;  Surgeon: Melrose Nakayama, MD;  Location: [facility] OR;  Service: Thoracic;  Laterality: N/A;    REVIEW OF SYSTEMS:  A comprehensive review of systems was negative.   PHYSICAL EXAMINATION: General appearance: alert, cooperative and no distress Head: Normocephalic, without obvious abnormality, atraumatic Neck: no adenopathy, no JVD, supple, symmetrical, trachea  midline and thyroid not enlarged, symmetric, no tenderness/mass/nodules Lymph nodes: Cervical, supraclavicular, and axillary nodes normal. Resp: clear to auscultation bilaterally Back: symmetric, no curvature. ROM normal. No CVA tenderness. Cardio: regular rate and rhythm, S1, S2 normal, no murmur, click, rub or gallop GI: soft, non-tender; bowel sounds normal; no masses,  no organomegaly Extremities: extremities normal, atraumatic, no cyanosis or edema  ECOG PERFORMANCE STATUS: 0 - Asymptomatic  Blood pressure 122/84, pulse 86, temperature 98.2 F (36.8 C), temperature source Oral, resp. rate 18, height 5\' 7"  (1.702 m), weight 174 lb 11.2 oz (79.2 kg), SpO2 98 %.  LABORATORY DATA: Lab Results  Component Value Date   WBC 4.6 02/02/2019   HGB 13.1 02/02/2019   HCT 40.5 02/02/2019   MCV 88.2 02/02/2019   PLT 367 02/02/2019      Chemistry      Component Value Date/Time   NA 139 01/19/2019 1143   NA 144 04/02/2017 1435   K 3.5 01/19/2019 1143   CL 106 01/19/2019 1143   CO2 25 01/19/2019 1143   BUN 14 01/19/2019 1143   BUN 11 04/02/2017 1435   CREATININE 0.99 01/19/2019 1143   CREATININE 0.87 03/22/2016 0908      Component Value Date/Time   CALCIUM 9.4 01/19/2019 1143   ALKPHOS 112 01/19/2019 1143   AST 15 01/19/2019 1143   ALT 15 01/19/2019 1143   BILITOT 0.4 01/19/2019 1143       RADIOGRAPHIC STUDIES: Ct Chest W Contrast  Result Date: 01/05/2019 CLINICAL DATA:  Follow-up right lung cancer diagnosed in December 2019. Chemotherapy and radiation therapy completed with ongoing immunotherapy. Ex-smoker. EXAM: CT CHEST WITH CONTRAST TECHNIQUE: Multidetector CT imaging of the chest was performed during intravenous contrast administration. CONTRAST:  54mL OMNIPAQUE IOHEXOL 300 MG/ML  SOLN COMPARISON:  09/25/2018. FINDINGS: Cardiovascular: Small pericardial effusion with a minimal increase in amount, with a maximum thickness of 14 mm. Normal sized heart. Atheromatous  calcifications, including the coronary arteries and aorta. Mediastinum/Nodes: 2.5 cm heterogeneous left lobe thyroid nodule without significant change. An enlarged right hilar lymph node is again demonstrated with a short axis diameter of 10 mm on image number 70 series 2, previously 13 mm in corresponding diameter. No other enlarged lymph nodes are seen. Mild diffuse esophageal wall thickening without significant change. Lungs/Pleura: The previously demonstrated 4.4 x 3.3 cm irregular right upper lobe mass currently measures 4.7 x 1.7 cm in corresponding dimensions on image number 55 series 5. There is a new irregular, contiguous, similar-appearing component more posteriorly and laterally, measuring 2.8 x 1.6 cm on image number 56 series 5. More inferiorly in the right upper lobe is a 2nd irregular part solid nodule measuring 2.4 x 1.3 cm. The lungs remain hyperexpanded with diffuse peribronchial thickening. No new abnormalities are seen on the left. No pleural fluid. Upper Abdomen: Stable small right lobe liver probable cyst. Musculoskeletal: Mild thoracic and lower cervical spine degenerative changes no evidence of bony metastatic disease. IMPRESSION: 1. Interval mild decrease in size of the previously demonstrated irregular right upper lobe mass. 2. To interval  irregular nodular areas in the right upper lobe inferior to the primary mass, 1 measuring 2.8 x 1.6 cm and the other measuring 2.4 x 1.3 cm. These are suspicious for spread of malignancy. 3. Probable metastatic right hilar lymph node with a mild interval decrease in size. 4. 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 5. Minimal increase in size of a small pericardial effusion. 6. Stable 2.5 cm left lobe thyroid nodule. This could be better evaluated with thyroid ultrasound. If patient is clinically hyperthyroid, consider nuclear medicine thyroid uptake and scan. 7. Calcific coronary artery and aortic atherosclerosis. Aortic Atherosclerosis  (ICD10-I70.0) and Emphysema (ICD10-J43.9). Electronically Signed   By: Claudie Revering M.D.   On: 01/05/2019 08:33    ASSESSMENT AND PLAN: This is a very pleasant 73 years old African-American female recently diagnosed with a stage IIIB (T4, N2, M0) non-small cell lung cancer, poorly differentiated squamous cell carcinoma presented with right upper lobe lung mass in addition to right hilar and mediastinal lymphadenopathy. The patient underwent concurrent chemoradiation with weekly carboplatin and paclitaxel status post 8 cycles. She has partial response to this treatment. The patient was started on consolidation treatment with immunotherapy with Imfinzi 10 mg/KG every 2 weeks status post 8 cycles. She has been tolerating her treatment well with no concerning adverse effects. I recommended for her to proceed with cycle #9 today as planned. I will see her back for follow-up visit in 2 weeks for evaluation before starting cycle #10. She was advised to call immediately if she has any concerning symptoms in the interval. The patient voices understanding of current disease status and treatment options and is in agreement with the current care plan.  All questions were answered. The patient knows to call the clinic with any problems, questions or concerns. We can certainly see the patient much sooner if necessary.  Disclaimer: This note was dictated with voice recognition software. Similar sounding words can inadvertently be transcribed and may not be corrected upon review.

## 2019-02-16 ENCOUNTER — Inpatient Hospital Stay: Payer: BC Managed Care – PPO | Attending: Internal Medicine

## 2019-02-16 ENCOUNTER — Inpatient Hospital Stay (HOSPITAL_BASED_OUTPATIENT_CLINIC_OR_DEPARTMENT_OTHER): Payer: BC Managed Care – PPO | Admitting: Internal Medicine

## 2019-02-16 ENCOUNTER — Encounter: Payer: Self-pay | Admitting: Internal Medicine

## 2019-02-16 ENCOUNTER — Inpatient Hospital Stay: Payer: BC Managed Care – PPO

## 2019-02-16 ENCOUNTER — Other Ambulatory Visit: Payer: Self-pay

## 2019-02-16 VITALS — BP 120/85 | HR 88 | Temp 97.8°F | Resp 18 | Ht 67.0 in | Wt 174.2 lb

## 2019-02-16 DIAGNOSIS — C3411 Malignant neoplasm of upper lobe, right bronchus or lung: Secondary | ICD-10-CM

## 2019-02-16 DIAGNOSIS — Z923 Personal history of irradiation: Secondary | ICD-10-CM | POA: Insufficient documentation

## 2019-02-16 DIAGNOSIS — Z79899 Other long term (current) drug therapy: Secondary | ICD-10-CM | POA: Diagnosis not present

## 2019-02-16 DIAGNOSIS — E785 Hyperlipidemia, unspecified: Secondary | ICD-10-CM | POA: Insufficient documentation

## 2019-02-16 DIAGNOSIS — I10 Essential (primary) hypertension: Secondary | ICD-10-CM | POA: Diagnosis not present

## 2019-02-16 DIAGNOSIS — R590 Localized enlarged lymph nodes: Secondary | ICD-10-CM | POA: Diagnosis not present

## 2019-02-16 DIAGNOSIS — C3491 Malignant neoplasm of unspecified part of right bronchus or lung: Secondary | ICD-10-CM

## 2019-02-16 DIAGNOSIS — Z7982 Long term (current) use of aspirin: Secondary | ICD-10-CM | POA: Insufficient documentation

## 2019-02-16 DIAGNOSIS — Z87891 Personal history of nicotine dependence: Secondary | ICD-10-CM | POA: Insufficient documentation

## 2019-02-16 DIAGNOSIS — Z5112 Encounter for antineoplastic immunotherapy: Secondary | ICD-10-CM | POA: Insufficient documentation

## 2019-02-16 LAB — CBC WITH DIFFERENTIAL (CANCER CENTER ONLY)
Abs Immature Granulocytes: 0.02 10*3/uL (ref 0.00–0.07)
Basophils Absolute: 0.1 10*3/uL (ref 0.0–0.1)
Basophils Relative: 1 %
Eosinophils Absolute: 0.1 10*3/uL (ref 0.0–0.5)
Eosinophils Relative: 3 %
HCT: 40 % (ref 36.0–46.0)
Hemoglobin: 13 g/dL (ref 12.0–15.0)
Immature Granulocytes: 1 %
Lymphocytes Relative: 20 %
Lymphs Abs: 0.8 10*3/uL (ref 0.7–4.0)
MCH: 28.6 pg (ref 26.0–34.0)
MCHC: 32.5 g/dL (ref 30.0–36.0)
MCV: 87.9 fL (ref 80.0–100.0)
Monocytes Absolute: 0.4 10*3/uL (ref 0.1–1.0)
Monocytes Relative: 10 %
Neutro Abs: 2.6 10*3/uL (ref 1.7–7.7)
Neutrophils Relative %: 65 %
Platelet Count: 330 10*3/uL (ref 150–400)
RBC: 4.55 MIL/uL (ref 3.87–5.11)
RDW: 14.5 % (ref 11.5–15.5)
WBC Count: 4 10*3/uL (ref 4.0–10.5)
nRBC: 0 % (ref 0.0–0.2)

## 2019-02-16 LAB — CMP (CANCER CENTER ONLY)
ALT: 13 U/L (ref 0–44)
AST: 16 U/L (ref 15–41)
Albumin: 3.5 g/dL (ref 3.5–5.0)
Alkaline Phosphatase: 106 U/L (ref 38–126)
Anion gap: 7 (ref 5–15)
BUN: 14 mg/dL (ref 8–23)
CO2: 27 mmol/L (ref 22–32)
Calcium: 9.2 mg/dL (ref 8.9–10.3)
Chloride: 106 mmol/L (ref 98–111)
Creatinine: 0.94 mg/dL (ref 0.44–1.00)
GFR, Est AFR Am: >60 mL/min (ref >60)
GFR, Estimated: >60 mL/min (ref >60)
Glucose, Bld: 99 mg/dL (ref 70–99)
Potassium: 3.4 mmol/L — ABNORMAL LOW (ref 3.5–5.1)
Sodium: 140 mmol/L (ref 135–145)
Total Bilirubin: 0.5 mg/dL (ref 0.3–1.2)
Total Protein: 7 g/dL (ref 6.5–8.1)

## 2019-02-16 MED ORDER — SODIUM CHLORIDE 0.9 % IV SOLN
Freq: Once | INTRAVENOUS | Status: AC
  Administered 2019-02-16: 12:00:00 via INTRAVENOUS
  Filled 2019-02-16: qty 250

## 2019-02-16 MED ORDER — SODIUM CHLORIDE 0.9 % IV SOLN
9.4000 mg/kg | Freq: Once | INTRAVENOUS | Status: AC
  Administered 2019-02-16: 740 mg via INTRAVENOUS
  Filled 2019-02-16: qty 10

## 2019-02-16 NOTE — Patient Instructions (Signed)
[facility] Discharge Instructions for Patients Receiving Chemotherapy  Today you received the following chemotherapy agents: Imfinzi.  To help prevent nausea and vomiting after your treatment, we encourage you to take your nausea medication as directed.   If you develop nausea and vomiting that is not controlled by your nausea medication, call the clinic.   BELOW ARE SYMPTOMS THAT SHOULD BE REPORTED IMMEDIATELY:  *FEVER GREATER THAN 100.5 F  *CHILLS WITH OR WITHOUT FEVER  NAUSEA AND VOMITING THAT IS NOT CONTROLLED WITH YOUR NAUSEA MEDICATION  *UNUSUAL SHORTNESS OF BREATH  *UNUSUAL BRUISING OR BLEEDING  TENDERNESS IN MOUTH AND THROAT WITH OR WITHOUT PRESENCE OF ULCERS  *URINARY PROBLEMS  *BOWEL PROBLEMS  UNUSUAL RASH Items with * indicate a potential emergency and should be followed up as soon as possible.  Feel free to call the clinic should you have any questions or concerns. The clinic phone number is [phone].  Please show the CHEMO ALERT CARD at check-in to the Emergency Department and triage nurse.   

## 2019-02-16 NOTE — Progress Notes (Signed)
[place] [phone]   Fax:(336) [phone]  OFFICE PROGRESS NOTE  [name], MD [place] [address] [facility] [place] [address]  DIAGNOSIS: stage IIIB (T4, N2, M0) non-small cell lung cancer, poorly differentiated squamous cell carcinoma.  She presented with large right upper lobe lung mass in addition to right hilar and mediastinal lymphadenopathy diagnosed in January 2020.  PRIOR THERAPY: Concurrent chemoradiation with weekly carboplatin for AUC of 2 and paclitaxel 45 mg/M2.  First dose July 13, 2018.  Status post 8 cycles.  Last dose was given August 31, 2018.  CURRENT THERAPY: Consolidation treatment with immunotherapy with Imfinzi 10 mg/KG every 2 weeks.  First dose starts October 13, 2018.  Status post 9 cycles.  INTERVAL HISTORY: Christina [name] 73 y.o. female returns to the clinic today for follow-up visit.  The patient is feeling fine today with no concerning complaints.  She continues to tolerate her treatment with Imfinzi fairly well.  She denied having any fever or chills.  She has no nausea, vomiting, diarrhea or constipation.  She denied having any headache or visual changes.  She has no chest pain, shortness of breath, cough or hemoptysis.  She is here today for evaluation before starting cycle #10.  MEDICAL HISTORY: Past Medical History:  Diagnosis Date  . Allergy   . Cancer ([place])    lung cancer  . GERD (gastroesophageal reflux disease)   . Hyperlipidemia   . Hypertension   . Reflux   . Tubulovillous adenoma of colon 2003    ALLERGIES:  is allergic to coconut oil; codeine; shrimp [shellfish allergy]; and trandolapril.  MEDICATIONS:  Current Outpatient Medications  Medication Sig Dispense Refill  . albuterol (PROAIR HFA) 108 (90 Base) MCG/ACT inhaler Inhale 2 puffs into the lungs every 6 (six) hours as needed for wheezing or shortness of breath. 1 Inhaler 3  . amLODipine (NORVASC) 10 MG tablet TAKE 1  TABLET(10 MG) BY MOUTH DAILY 90 tablet 3  . aspirin EC 81 MG tablet Take 81 mg by mouth daily.    Marland Kitchen atorvastatin (LIPITOR) 40 MG tablet TAKE 1 TABLET BY MOUTH DAILY 90 tablet 3  . omeprazole (PRILOSEC) 40 MG capsule TAKE 1 CAPSULE(40 MG) BY MOUTH DAILY 90 capsule 3  . potassium chloride (K-DUR) 10 MEQ tablet Take 1 tablet (10 mEq total) by mouth 2 (two) times daily. 60 tablet 3  . sucralfate (CARAFATE) 1 g tablet TAKE 1 TABLET BY MOUTH 1 HOUR BEFORE MEALS AND AT BEDTIME AS NEEDED (Patient taking differently: Take 1 g by mouth 4 (four) times daily as needed (stomach acid). ) 120 tablet prn  . HYDROcodone-acetaminophen (HYCET) 7.5-325 mg/15 ml solution Take 15 mLs by mouth 4 (four) times daily as needed. (Patient not taking: Reported on 01/05/2019) 473 mL 0  . prochlorperazine (COMPAZINE) 10 MG tablet Take 1 tablet (10 mg total) by mouth every 6 (six) hours as needed for nausea or vomiting. (Patient not taking: Reported on 01/19/2019) 30 tablet 0   No current facility-administered medications for this visit.     SURGICAL HISTORY:  Past Surgical History:  Procedure Laterality Date  . CESAREAN SECTION     1 time  . COLONOSCOPY    . VIDEO BRONCHOSCOPY WITH ENDOBRONCHIAL NAVIGATION N/A 06/24/2018   Procedure: VIDEO BRONCHOSCOPY WITH ENDOBRONCHIAL NAVIGATION with biopsies, right upper lung lobe;  Surgeon: Melrose Nakayama, MD;  Location: [employer];  Service: Thoracic;  Laterality: N/A;  . VIDEO BRONCHOSCOPY  WITH ENDOBRONCHIAL ULTRASOUND N/A 06/24/2018   Procedure: VIDEO BRONCHOSCOPY WITH ENDOBRONCHIAL ULTRASOUND, right upper lung lobe;  Surgeon: Melrose Nakayama, MD;  Location: [facility] OR;  Service: Thoracic;  Laterality: N/A;    REVIEW OF SYSTEMS:  A comprehensive review of systems was negative.   PHYSICAL EXAMINATION: General appearance: alert, cooperative and no distress Head: Normocephalic, without obvious abnormality, atraumatic Neck: no adenopathy, no JVD, supple, symmetrical, trachea midline  and thyroid not enlarged, symmetric, no tenderness/mass/nodules Lymph nodes: Cervical, supraclavicular, and axillary nodes normal. Resp: clear to auscultation bilaterally Back: symmetric, no curvature. ROM normal. No CVA tenderness. Cardio: regular rate and rhythm, S1, S2 normal, no murmur, click, rub or gallop GI: soft, non-tender; bowel sounds normal; no masses,  no organomegaly Extremities: extremities normal, atraumatic, no cyanosis or edema  ECOG PERFORMANCE STATUS: 0 - Asymptomatic  Blood pressure 120/85, pulse 88, temperature 97.8 F (36.6 C), temperature source Oral, resp. rate 18, height 5\' 7"  (1.702 m), weight 174 lb 3.2 oz (79 kg), SpO2 98 %.  LABORATORY DATA: Lab Results  Component Value Date   WBC 4.0 02/16/2019   HGB 13.0 02/16/2019   HCT 40.0 02/16/2019   MCV 87.9 02/16/2019   PLT 330 02/16/2019      Chemistry      Component Value Date/Time   NA 140 02/16/2019 1007   NA 144 04/02/2017 1435   K 3.4 (L) 02/16/2019 1007   CL 106 02/16/2019 1007   CO2 27 02/16/2019 1007   BUN 14 02/16/2019 1007   BUN 11 04/02/2017 1435   CREATININE 0.94 02/16/2019 1007   CREATININE 0.87 03/22/2016 0908      Component Value Date/Time   CALCIUM 9.2 02/16/2019 1007   ALKPHOS 106 02/16/2019 1007   AST 16 02/16/2019 1007   ALT 13 02/16/2019 1007   BILITOT 0.5 02/16/2019 1007       RADIOGRAPHIC STUDIES: No results found.  ASSESSMENT AND PLAN: This is a very pleasant 73 years old African-American female recently diagnosed with a stage IIIB (T4, N2, M0) non-small cell lung cancer, poorly differentiated squamous cell carcinoma presented with right upper lobe lung mass in addition to right hilar and mediastinal lymphadenopathy. The patient underwent concurrent chemoradiation with weekly carboplatin and paclitaxel status post 8 cycles. She has partial response to this treatment. The patient was started on consolidation treatment with immunotherapy with Imfinzi 10 mg/KG every 2  weeks status post 9 cycles. The patient continues to tolerate this treatment well with no concerning adverse effects. I recommended for her to proceed with cycle #10 today as planned. She will come back for follow-up visit in 2 weeks for evaluation before the next cycle of her treatment. The patient was advised to call immediately if she has any concerning symptoms in the interval. The patient voices understanding of current disease status and treatment options and is in agreement with the current care plan.  All questions were answered. The patient knows to call the clinic with any problems, questions or concerns. We can certainly see the patient much sooner if necessary.  Disclaimer: This note was dictated with voice recognition software. Similar sounding words can inadvertently be transcribed and may not be corrected upon review.

## 2019-02-18 ENCOUNTER — Telehealth: Payer: Self-pay | Admitting: Internal Medicine

## 2019-02-18 NOTE — Telephone Encounter (Signed)
Patient already on schedule for q2w appointment as requested per 9/1 los.

## 2019-02-23 ENCOUNTER — Telehealth: Payer: Self-pay | Admitting: Internal Medicine

## 2019-02-23 NOTE — Telephone Encounter (Signed)
Returned patient's phone call regarding rescheduling September appointments, informed patient there is no other availability during the time the patient is requesting for. Patient will keep appointments as is for now.

## 2019-03-02 ENCOUNTER — Inpatient Hospital Stay (HOSPITAL_BASED_OUTPATIENT_CLINIC_OR_DEPARTMENT_OTHER): Payer: BC Managed Care – PPO | Admitting: Physician Assistant

## 2019-03-02 ENCOUNTER — Inpatient Hospital Stay: Payer: BC Managed Care – PPO

## 2019-03-02 ENCOUNTER — Other Ambulatory Visit: Payer: Self-pay

## 2019-03-02 ENCOUNTER — Encounter: Payer: Self-pay | Admitting: Physician Assistant

## 2019-03-02 VITALS — BP 113/75 | HR 97 | Temp 98.3°F | Resp 18 | Ht 67.0 in | Wt 174.8 lb

## 2019-03-02 DIAGNOSIS — Z5112 Encounter for antineoplastic immunotherapy: Secondary | ICD-10-CM | POA: Diagnosis not present

## 2019-03-02 DIAGNOSIS — Z79899 Other long term (current) drug therapy: Secondary | ICD-10-CM | POA: Diagnosis not present

## 2019-03-02 DIAGNOSIS — Z87891 Personal history of nicotine dependence: Secondary | ICD-10-CM | POA: Diagnosis not present

## 2019-03-02 DIAGNOSIS — R590 Localized enlarged lymph nodes: Secondary | ICD-10-CM | POA: Diagnosis not present

## 2019-03-02 DIAGNOSIS — Z923 Personal history of irradiation: Secondary | ICD-10-CM | POA: Diagnosis not present

## 2019-03-02 DIAGNOSIS — C3491 Malignant neoplasm of unspecified part of right bronchus or lung: Secondary | ICD-10-CM | POA: Diagnosis not present

## 2019-03-02 DIAGNOSIS — I10 Essential (primary) hypertension: Secondary | ICD-10-CM | POA: Diagnosis not present

## 2019-03-02 DIAGNOSIS — E785 Hyperlipidemia, unspecified: Secondary | ICD-10-CM | POA: Diagnosis not present

## 2019-03-02 DIAGNOSIS — Z7982 Long term (current) use of aspirin: Secondary | ICD-10-CM | POA: Diagnosis not present

## 2019-03-02 DIAGNOSIS — C3411 Malignant neoplasm of upper lobe, right bronchus or lung: Secondary | ICD-10-CM | POA: Diagnosis not present

## 2019-03-02 LAB — CBC WITH DIFFERENTIAL (CANCER CENTER ONLY)
Abs Immature Granulocytes: 0.02 10*3/uL (ref 0.00–0.07)
Basophils Absolute: 0.1 10*3/uL (ref 0.0–0.1)
Basophils Relative: 1 %
Eosinophils Absolute: 0.1 10*3/uL (ref 0.0–0.5)
Eosinophils Relative: 2 %
HCT: 40.1 % (ref 36.0–46.0)
Hemoglobin: 12.9 g/dL (ref 12.0–15.0)
Immature Granulocytes: 0 %
Lymphocytes Relative: 21 %
Lymphs Abs: 1.1 10*3/uL (ref 0.7–4.0)
MCH: 28.5 pg (ref 26.0–34.0)
MCHC: 32.2 g/dL (ref 30.0–36.0)
MCV: 88.5 fL (ref 80.0–100.0)
Monocytes Absolute: 0.5 10*3/uL (ref 0.1–1.0)
Monocytes Relative: 10 %
Neutro Abs: 3.6 10*3/uL (ref 1.7–7.7)
Neutrophils Relative %: 66 %
Platelet Count: 381 10*3/uL (ref 150–400)
RBC: 4.53 MIL/uL (ref 3.87–5.11)
RDW: 14.9 % (ref 11.5–15.5)
WBC Count: 5.4 10*3/uL (ref 4.0–10.5)
nRBC: 0 % (ref 0.0–0.2)

## 2019-03-02 LAB — CMP (CANCER CENTER ONLY)
ALT: 10 U/L (ref 0–44)
AST: 15 U/L (ref 15–41)
Albumin: 3.8 g/dL (ref 3.5–5.0)
Alkaline Phosphatase: 115 U/L (ref 38–126)
Anion gap: 8 (ref 5–15)
BUN: 15 mg/dL (ref 8–23)
CO2: 28 mmol/L (ref 22–32)
Calcium: 9.6 mg/dL (ref 8.9–10.3)
Chloride: 106 mmol/L (ref 98–111)
Creatinine: 1.01 mg/dL — ABNORMAL HIGH (ref 0.44–1.00)
GFR, Est AFR Am: >60 mL/min (ref >60)
GFR, Estimated: 56 mL/min — ABNORMAL LOW (ref >60)
Glucose, Bld: 89 mg/dL (ref 70–99)
Potassium: 3.5 mmol/L (ref 3.5–5.1)
Sodium: 142 mmol/L (ref 135–145)
Total Bilirubin: 0.5 mg/dL (ref 0.3–1.2)
Total Protein: 7.5 g/dL (ref 6.5–8.1)

## 2019-03-02 LAB — TSH: TSH: 0.945 u[IU]/mL (ref 0.308–3.960)

## 2019-03-02 MED ORDER — SODIUM CHLORIDE 0.9 % IV SOLN
9.6000 mg/kg | Freq: Once | INTRAVENOUS | Status: AC
  Administered 2019-03-02: 740 mg via INTRAVENOUS
  Filled 2019-03-02: qty 4.8

## 2019-03-02 MED ORDER — SODIUM CHLORIDE 0.9 % IV SOLN
Freq: Once | INTRAVENOUS | Status: AC
  Administered 2019-03-02: 14:00:00 via INTRAVENOUS
  Filled 2019-03-02: qty 250

## 2019-03-02 NOTE — Progress Notes (Signed)
[place] OFFICE PROGRESS NOTE  [name], MD [address] [facility] [place] [address]  DIAGNOSIS: Stage IIIB(T4, N2, M0) non-small cell lung cancer, poorly differentiated squamous cell carcinoma. She presented with large right upper lobe lung mass in addition to right hilar and mediastinal lymphadenopathy diagnosed in January 2020.  PRIOR THERAPY: Concurrent chemoradiation with weekly carboplatin for AUC of 2 and paclitaxel 45 mg/M2. First dose July 13, 2018. Status post 8 cycles. Last dose was given August 31, 2018.  CURRENT THERAPY: Consolidation treatment with immunotherapy with Imfinzi 10 mg/KG every 2 weeks. First dose starts October 13, 2018. Status post10cycles.  INTERVAL HISTORY: Christina [name] 73 y.o. female returns to the clinic for a follow up visit. The patient is feeling well today without any concerning complaints. The patient continues to tolerate treatment with immunotherapy with imfinzi well without any adverse side effects. Denies any fever, chills, night sweats, or weight loss. Denies any chest pain, shortness of breath, cough, or hemoptysis. Denies any nausea, vomiting, diarrhea, or constipation. Denies any headache or visual changes. Denies any rashes or skin changes. The patient is here today for evaluation prior to starting cycle # 11.  MEDICAL HISTORY: Past Medical History:  Diagnosis Date  . Allergy   . Cancer ([place])    lung cancer  . GERD (gastroesophageal reflux disease)   . Hyperlipidemia   . Hypertension   . Reflux   . Tubulovillous adenoma of colon 2003    ALLERGIES:  is allergic to coconut oil; codeine; shrimp [shellfish allergy]; and trandolapril.  MEDICATIONS:  Current Outpatient Medications  Medication Sig Dispense Refill  . albuterol (PROAIR HFA) 108 (90 Base) MCG/ACT inhaler Inhale 2 puffs into the lungs every 6 (six) hours as needed for wheezing or shortness of breath. 1 Inhaler 3  .  amLODipine (NORVASC) 10 MG tablet TAKE 1 TABLET(10 MG) BY MOUTH DAILY 90 tablet 3  . aspirin EC 81 MG tablet Take 81 mg by mouth daily.    Marland Kitchen atorvastatin (LIPITOR) 40 MG tablet TAKE 1 TABLET BY MOUTH DAILY 90 tablet 3  . omeprazole (PRILOSEC) 40 MG capsule TAKE 1 CAPSULE(40 MG) BY MOUTH DAILY 90 capsule 3  . potassium chloride (K-DUR) 10 MEQ tablet Take 1 tablet (10 mEq total) by mouth 2 (two) times daily. 60 tablet 3  . sucralfate (CARAFATE) 1 g tablet TAKE 1 TABLET BY MOUTH 1 HOUR BEFORE MEALS AND AT BEDTIME AS NEEDED (Patient taking differently: Take 1 g by mouth 4 (four) times daily as needed (stomach acid). ) 120 tablet prn  . HYDROcodone-acetaminophen (HYCET) 7.5-325 mg/15 ml solution Take 15 mLs by mouth 4 (four) times daily as needed. (Patient not taking: Reported on 01/05/2019) 473 mL 0  . prochlorperazine (COMPAZINE) 10 MG tablet Take 1 tablet (10 mg total) by mouth every 6 (six) hours as needed for nausea or vomiting. (Patient not taking: Reported on 01/19/2019) 30 tablet 0   No current facility-administered medications for this visit.    Facility-Administered Medications Ordered in Other Visits  Medication Dose Route Frequency Provider Last Rate Last Dose  . 0.9 %  sodium chloride infusion   Intravenous Once Curt Bears, MD      . durvalumab [facility]) 780 mg in sodium chloride 0.9 % 100 mL chemo infusion  10 mg/kg (Treatment Plan Recorded) Intravenous Once Curt Bears, MD        SURGICAL HISTORY:  Past Surgical History:  Procedure Laterality Date  . CESAREAN SECTION  1 time  . COLONOSCOPY    . VIDEO BRONCHOSCOPY WITH ENDOBRONCHIAL NAVIGATION N/A 06/24/2018   Procedure: VIDEO BRONCHOSCOPY WITH ENDOBRONCHIAL NAVIGATION with biopsies, right upper lung lobe;  Surgeon: Melrose Nakayama, MD;  Location: [facility] OR;  Service: Thoracic;  Laterality: N/A;  . VIDEO BRONCHOSCOPY WITH ENDOBRONCHIAL ULTRASOUND N/A 06/24/2018   Procedure: VIDEO BRONCHOSCOPY WITH ENDOBRONCHIAL ULTRASOUND,  right upper lung lobe;  Surgeon: Melrose Nakayama, MD;  Location: [facility] OR;  Service: Thoracic;  Laterality: N/A;    REVIEW OF SYSTEMS:   Review of Systems  Constitutional: Negative for appetite change, chills, fatigue, fever and unexpected weight change.  HENT: Negative for mouth sores, nosebleeds, sore throat and trouble swallowing.   Eyes: Negative for eye problems and icterus.  Respiratory: Negative for cough, hemoptysis, shortness of breath and wheezing.   Cardiovascular: Negative for chest pain and leg swelling.  Gastrointestinal: Negative for abdominal pain, constipation, diarrhea, nausea and vomiting.  Genitourinary: Negative for bladder incontinence, difficulty urinating, dysuria, frequency and hematuria.   Musculoskeletal: Negative for back pain, gait problem, neck pain and neck stiffness.  Skin: Negative for itching and rash.  Neurological: Negative for dizziness, extremity weakness, gait problem, headaches, light-headedness and seizures.  Hematological: Negative for adenopathy. Does not bruise/bleed easily.  Psychiatric/Behavioral: Negative for confusion, depression and sleep disturbance. The patient is not nervous/anxious.     PHYSICAL EXAMINATION:  Blood pressure 113/75, pulse 97, temperature 98.3 F (36.8 C), temperature source Temporal, resp. rate 18, height 5\' 7"  (1.702 m), weight 174 lb 12.8 oz (79.3 kg), SpO2 99 %.  ECOG PERFORMANCE STATUS: 0 - Asymptomatic  Physical Exam  Constitutional: Oriented to person, place, and time and well-developed, well-nourished, and in no distress.  HENT:  Head: Normocephalic and atraumatic.  Mouth/Throat: Oropharynx is clear and moist. No oropharyngeal exudate.  Eyes: Conjunctivae are normal. Right eye exhibits no discharge. Left eye exhibits no discharge. No scleral icterus.  Neck: Normal range of motion. Neck supple.  Cardiovascular: Normal rate, regular rhythm, normal heart sounds and intact distal pulses.   Pulmonary/Chest:  Effort normal and breath sounds normal. No respiratory distress. No wheezes. No rales.  Abdominal: Soft. Bowel sounds are normal. Exhibits no distension and no mass. There is no tenderness.  Musculoskeletal: Normal range of motion. Exhibits no edema.  Lymphadenopathy:    No cervical adenopathy.  Neurological: Alert and oriented to person, place, and time. Exhibits normal muscle tone. Gait normal. Coordination normal.  Skin: Skin is warm and dry. No rash noted. Not diaphoretic. No erythema. No pallor.  Psychiatric: Mood, memory and judgment normal.  Vitals reviewed.  LABORATORY DATA: Lab Results  Component Value Date   WBC 5.4 03/02/2019   HGB 12.9 03/02/2019   HCT 40.1 03/02/2019   MCV 88.5 03/02/2019   PLT 381 03/02/2019      Chemistry      Component Value Date/Time   NA 142 03/02/2019 1254   NA 144 04/02/2017 1435   K 3.5 03/02/2019 1254   CL 106 03/02/2019 1254   CO2 28 03/02/2019 1254   BUN 15 03/02/2019 1254   BUN 11 04/02/2017 1435   CREATININE 1.01 (H) 03/02/2019 1254   CREATININE 0.87 03/22/2016 0908      Component Value Date/Time   CALCIUM 9.6 03/02/2019 1254   ALKPHOS 115 03/02/2019 1254   AST 15 03/02/2019 1254   ALT 10 03/02/2019 1254   BILITOT 0.5 03/02/2019 1254       RADIOGRAPHIC STUDIES:  No results found.   ASSESSMENT/PLAN:  This is a very pleasant 73 year old African-American female diagnosed with stage IIIb(T4, N2, M0) non-small cell lung cancer, poorly differentiated squamous cell carcinoma. She presented with a right upper lung mass in addition to right hilar and mediastinal lymphadenopathy. She was diagnosed in January 2020.  The patient previously received concurrent chemoradiation with weekly carboplatin for an AUC of 2 and paclitaxel 45 mg/m. She is status post 8 cycles.  The patient is currently undergoing consolidation immunotherapy with Imfinzi 10 mg/kg IV every 2 weeks. She is status post 10 cycles of treatment. She has been  tolerating it well without any adverse side effects.   The patient was seen with Dr. Julien Nordmann today.Labs were reviewed with the patient today. We recommend that she proceed with cycle #11 today as scheduled.  I will arrange for the patient to receive a restaging CT scan prior to her next visit per Dr. Worthy Flank recommendation from her last scan to further evaluate the changes seen. Her last scan showed stable disease with some mild improvement in the mediastinal lymph node but there was 2 areas of spiculated nodules in the right lung suspicious for disease progression but also could represent an inflammatory process.   We will see her back for follow-up visit in 2 weeks for evaluation and to review her scan results before proceeding with cycle #12.   The patient was advised to call immediately if she has any concerning symptoms in the interval. The patient voices understanding of current disease status and treatment options and is in agreement with the current care plan. All questions were answered. The patient knows to call the clinic with any problems, questions or concerns. We can certainly see the patient much sooner if necessary   Orders Placed This Encounter  Procedures  . CT Chest W Contrast    Standing Status:   Future    Standing Expiration Date:   03/01/2020    Order Specific Question:   ** REASON FOR EXAM (FREE TEXT)    Answer:   Restaging Lung Cancer    Order Specific Question:   If indicated for the ordered procedure, I authorize the administration of contrast media per Radiology protocol    Answer:   Yes    Order Specific Question:   Preferred imaging location?    Answer:   [facility]    Order Specific Question:   Radiology Contrast Protocol - do NOT remove file path    Answer:   [email or web address]     Deagan Sevin L Eun Vermeer, PA-C 03/02/19  ADDENDUM: Hematology/Oncology Attending: I had a face-to-face encounter with the patient  today.  I recommended her care plan.  This is a very pleasant 73 years old African-American female with a stage IIIB non-small cell lung cancer, squamous cell carcinoma status post concurrent chemoradiation with partial response.  The patient is currently undergoing consolidation treatment with Imfinzi every 2 weeks status post 10 cycles.  She has been tolerating the treatment well with no concerning adverse effects. I recommended for the patient to proceed with cycle #11 today. She will come back for follow-up visit in 2 weeks for evaluation with repeat CT scan of the chest for restaging of her disease. She was advised to call immediately if she has any concerning symptoms in the interval.  Disclaimer: This note was dictated with voice recognition software. Similar sounding words can inadvertently be transcribed and may be missed upon review. Eilleen Kempf, MD 03/02/19

## 2019-03-02 NOTE — Patient Instructions (Signed)
[facility] Discharge Instructions for Patients Receiving Chemotherapy  Today you received the following chemotherapy agents: Imfinzi.  To help prevent nausea and vomiting after your treatment, we encourage you to take your nausea medication as directed.   If you develop nausea and vomiting that is not controlled by your nausea medication, call the clinic.   BELOW ARE SYMPTOMS THAT SHOULD BE REPORTED IMMEDIATELY:  *FEVER GREATER THAN 100.5 F  *CHILLS WITH OR WITHOUT FEVER  NAUSEA AND VOMITING THAT IS NOT CONTROLLED WITH YOUR NAUSEA MEDICATION  *UNUSUAL SHORTNESS OF BREATH  *UNUSUAL BRUISING OR BLEEDING  TENDERNESS IN MOUTH AND THROAT WITH OR WITHOUT PRESENCE OF ULCERS  *URINARY PROBLEMS  *BOWEL PROBLEMS  UNUSUAL RASH Items with * indicate a potential emergency and should be followed up as soon as possible.  Feel free to call the clinic should you have any questions or concerns. The clinic phone number is [phone].  Please show the CHEMO ALERT CARD at check-in to the Emergency Department and triage nurse.   

## 2019-03-03 ENCOUNTER — Encounter: Payer: Self-pay | Admitting: Gastroenterology

## 2019-03-11 ENCOUNTER — Ambulatory Visit (HOSPITAL_COMMUNITY)
Admission: RE | Admit: 2019-03-11 | Discharge: 2019-03-11 | Disposition: A | Discharge status: Home / Self Care | Payer: BC Managed Care – PPO | Source: Ambulatory Visit | Attending: Physician Assistant | Admitting: Physician Assistant

## 2019-03-11 ENCOUNTER — Other Ambulatory Visit: Payer: Self-pay

## 2019-03-11 ENCOUNTER — Encounter (HOSPITAL_COMMUNITY): Payer: Self-pay

## 2019-03-11 DIAGNOSIS — C3491 Malignant neoplasm of unspecified part of right bronchus or lung: Secondary | ICD-10-CM | POA: Diagnosis not present

## 2019-03-11 DIAGNOSIS — C3490 Malignant neoplasm of unspecified part of unspecified bronchus or lung: Secondary | ICD-10-CM | POA: Diagnosis not present

## 2019-03-11 MED ORDER — IOHEXOL 300 MG/ML  SOLN
75.0000 mL | Freq: Once | INTRAMUSCULAR | Status: AC | PRN
  Administered 2019-03-11: 75 mL via INTRAVENOUS

## 2019-03-11 MED ORDER — SODIUM CHLORIDE (PF) 0.9 % IJ SOLN
INTRAMUSCULAR | Status: AC
  Filled 2019-03-11: qty 50

## 2019-03-12 ENCOUNTER — Inpatient Hospital Stay: Payer: BC Managed Care – PPO

## 2019-03-16 ENCOUNTER — Inpatient Hospital Stay: Payer: BC Managed Care – PPO | Admitting: Internal Medicine

## 2019-03-16 ENCOUNTER — Encounter: Payer: Self-pay | Admitting: Internal Medicine

## 2019-03-16 ENCOUNTER — Other Ambulatory Visit: Payer: Self-pay

## 2019-03-16 ENCOUNTER — Inpatient Hospital Stay: Payer: BC Managed Care – PPO

## 2019-03-16 VITALS — BP 130/82 | HR 90 | Temp 98.5°F | Resp 18 | Ht 67.0 in | Wt 177.6 lb

## 2019-03-16 DIAGNOSIS — Z923 Personal history of irradiation: Secondary | ICD-10-CM | POA: Diagnosis not present

## 2019-03-16 DIAGNOSIS — Z5112 Encounter for antineoplastic immunotherapy: Secondary | ICD-10-CM

## 2019-03-16 DIAGNOSIS — C3491 Malignant neoplasm of unspecified part of right bronchus or lung: Secondary | ICD-10-CM

## 2019-03-16 DIAGNOSIS — I10 Essential (primary) hypertension: Secondary | ICD-10-CM

## 2019-03-16 DIAGNOSIS — Z87891 Personal history of nicotine dependence: Secondary | ICD-10-CM | POA: Diagnosis not present

## 2019-03-16 DIAGNOSIS — C3411 Malignant neoplasm of upper lobe, right bronchus or lung: Secondary | ICD-10-CM | POA: Diagnosis not present

## 2019-03-16 DIAGNOSIS — R590 Localized enlarged lymph nodes: Secondary | ICD-10-CM | POA: Diagnosis not present

## 2019-03-16 DIAGNOSIS — Z7982 Long term (current) use of aspirin: Secondary | ICD-10-CM | POA: Diagnosis not present

## 2019-03-16 DIAGNOSIS — E785 Hyperlipidemia, unspecified: Secondary | ICD-10-CM | POA: Diagnosis not present

## 2019-03-16 DIAGNOSIS — Z79899 Other long term (current) drug therapy: Secondary | ICD-10-CM | POA: Diagnosis not present

## 2019-03-16 LAB — CMP (CANCER CENTER ONLY)
ALT: 15 U/L (ref 0–44)
AST: 15 U/L (ref 15–41)
Albumin: 3.7 g/dL (ref 3.5–5.0)
Alkaline Phosphatase: 107 U/L (ref 38–126)
Anion gap: 8 (ref 5–15)
BUN: 12 mg/dL (ref 8–23)
CO2: 24 mmol/L (ref 22–32)
Calcium: 9 mg/dL (ref 8.9–10.3)
Chloride: 107 mmol/L (ref 98–111)
Creatinine: 0.89 mg/dL (ref 0.44–1.00)
GFR, Est AFR Am: >60 mL/min (ref >60)
GFR, Estimated: >60 mL/min (ref >60)
Glucose, Bld: 95 mg/dL (ref 70–99)
Potassium: 3.6 mmol/L (ref 3.5–5.1)
Sodium: 139 mmol/L (ref 135–145)
Total Bilirubin: 0.5 mg/dL (ref 0.3–1.2)
Total Protein: 7.1 g/dL (ref 6.5–8.1)

## 2019-03-16 LAB — CBC WITH DIFFERENTIAL (CANCER CENTER ONLY)
Abs Immature Granulocytes: 0.02 10*3/uL (ref 0.00–0.07)
Basophils Absolute: 0.1 10*3/uL (ref 0.0–0.1)
Basophils Relative: 1 %
Eosinophils Absolute: 0.1 10*3/uL (ref 0.0–0.5)
Eosinophils Relative: 3 %
HCT: 39.3 % (ref 36.0–46.0)
Hemoglobin: 12.7 g/dL (ref 12.0–15.0)
Immature Granulocytes: 0 %
Lymphocytes Relative: 26 %
Lymphs Abs: 1.2 10*3/uL (ref 0.7–4.0)
MCH: 28.5 pg (ref 26.0–34.0)
MCHC: 32.3 g/dL (ref 30.0–36.0)
MCV: 88.3 fL (ref 80.0–100.0)
Monocytes Absolute: 0.5 10*3/uL (ref 0.1–1.0)
Monocytes Relative: 10 %
Neutro Abs: 2.9 10*3/uL (ref 1.7–7.7)
Neutrophils Relative %: 60 %
Platelet Count: 327 10*3/uL (ref 150–400)
RBC: 4.45 MIL/uL (ref 3.87–5.11)
RDW: 15.5 % (ref 11.5–15.5)
WBC Count: 4.8 10*3/uL (ref 4.0–10.5)
nRBC: 0 % (ref 0.0–0.2)

## 2019-03-16 MED ORDER — SODIUM CHLORIDE 0.9 % IV SOLN
9.4000 mg/kg | Freq: Once | INTRAVENOUS | Status: AC
  Administered 2019-03-16: 740 mg via INTRAVENOUS
  Filled 2019-03-16: qty 4.8

## 2019-03-16 MED ORDER — SODIUM CHLORIDE 0.9 % IV SOLN
Freq: Once | INTRAVENOUS | Status: AC
  Administered 2019-03-16: 15:00:00 via INTRAVENOUS
  Filled 2019-03-16: qty 250

## 2019-03-16 NOTE — Progress Notes (Signed)
[place] [phone]   [phone]  OFFICE PROGRESS NOTE  [name], MD [address] [facility] [place] [address]  DIAGNOSIS: stage IIIB (T4, N2, M0) non-small cell lung cancer, poorly differentiated squamous cell carcinoma.  She presented with large right upper lobe lung mass in addition to right hilar and mediastinal lymphadenopathy diagnosed in January 2020.  PRIOR THERAPY: Concurrent chemoradiation with weekly carboplatin for AUC of 2 and paclitaxel 45 mg/M2.  First dose July 13, 2018.  Status post 8 cycles.  Last dose was given August 31, 2018.  CURRENT THERAPY: Consolidation treatment with immunotherapy with Imfinzi 10 mg/KG every 2 weeks.  First dose starts October 13, 2018.  Status post 11 cycles.  INTERVAL HISTORY: Christina [name] 73 y.o. female returns to the clinic today for follow-up visit.  The patient is feeling fine today with no concerning complaints.  She denied having any chest pain, shortness of breath, cough or hemoptysis.  She denied having any fever or chills.  She has no nausea, vomiting, diarrhea or constipation.  She denied having any headache or visual changes.  She has been tolerating her treatment with Imfinzi fairly well.  She had repeat CT scan of the chest performed recently and she is here for evaluation and discussion of her scan results.   MEDICAL HISTORY: Past Medical History:  Diagnosis Date   Allergy    Cancer ([place])    lung cancer   GERD (gastroesophageal reflux disease)    Hyperlipidemia    Hypertension    Reflux    Tubulovillous adenoma of colon 2003    ALLERGIES:  is allergic to coconut oil; codeine; shrimp [shellfish allergy]; and trandolapril.  MEDICATIONS:  Current Outpatient Medications  Medication Sig Dispense Refill   albuterol (PROAIR HFA) 108 (90 Base) MCG/ACT inhaler Inhale 2 puffs into the lungs every 6 (six) hours as needed for wheezing or shortness of  breath. 1 Inhaler 3   amLODipine (NORVASC) 10 MG tablet TAKE 1 TABLET(10 MG) BY MOUTH DAILY 90 tablet 3   aspirin EC 81 MG tablet Take 81 mg by mouth daily.     atorvastatin (LIPITOR) 40 MG tablet TAKE 1 TABLET BY MOUTH DAILY 90 tablet 3   HYDROcodone-acetaminophen (HYCET) 7.5-325 mg/15 ml solution Take 15 mLs by mouth 4 (four) times daily as needed. (Patient not taking: Reported on 01/05/2019) 473 mL 0   omeprazole (PRILOSEC) 40 MG capsule TAKE 1 CAPSULE(40 MG) BY MOUTH DAILY 90 capsule 3   potassium chloride (K-DUR) 10 MEQ tablet Take 1 tablet (10 mEq total) by mouth 2 (two) times daily. 60 tablet 3   prochlorperazine (COMPAZINE) 10 MG tablet Take 1 tablet (10 mg total) by mouth every 6 (six) hours as needed for nausea or vomiting. (Patient not taking: Reported on 01/19/2019) 30 tablet 0   sucralfate (CARAFATE) 1 g tablet TAKE 1 TABLET BY MOUTH 1 HOUR BEFORE MEALS AND AT BEDTIME AS NEEDED (Patient taking differently: Take 1 g by mouth 4 (four) times daily as needed (stomach acid). ) 120 tablet prn   No current facility-administered medications for this visit.     SURGICAL HISTORY:  Past Surgical History:  Procedure Laterality Date   CESAREAN SECTION     1 time   COLONOSCOPY     VIDEO BRONCHOSCOPY WITH ENDOBRONCHIAL NAVIGATION N/A 06/24/2018   Procedure: VIDEO BRONCHOSCOPY WITH ENDOBRONCHIAL NAVIGATION with biopsies, right upper lung lobe;  Surgeon: Melrose Nakayama, MD;  Location: [facility] OR;  Service: Thoracic;  Laterality: N/A;   VIDEO BRONCHOSCOPY WITH ENDOBRONCHIAL ULTRASOUND N/A 06/24/2018   Procedure: VIDEO BRONCHOSCOPY WITH ENDOBRONCHIAL ULTRASOUND, right upper lung lobe;  Surgeon: Melrose Nakayama, MD;  Location: [facility] OR;  Service: Thoracic;  Laterality: N/A;    REVIEW OF SYSTEMS:  Constitutional: negative Eyes: negative Ears, nose, mouth, throat, and face: negative Respiratory: negative Cardiovascular: negative Gastrointestinal:  negative Genitourinary:negative Integument/breast: negative Hematologic/lymphatic: negative Musculoskeletal:negative Neurological: negative Behavioral/Psych: negative Endocrine: negative Allergic/Immunologic: negative   PHYSICAL EXAMINATION: General appearance: alert, cooperative and no distress Head: Normocephalic, without obvious abnormality, atraumatic Neck: no adenopathy, no JVD, supple, symmetrical, trachea midline and thyroid not enlarged, symmetric, no tenderness/mass/nodules Lymph nodes: Cervical, supraclavicular, and axillary nodes normal. Resp: clear to auscultation bilaterally Back: symmetric, no curvature. ROM normal. No CVA tenderness. Cardio: regular rate and rhythm, S1, S2 normal, no murmur, click, rub or gallop GI: soft, non-tender; bowel sounds normal; no masses,  no organomegaly Extremities: extremities normal, atraumatic, no cyanosis or edema Neurologic: Alert and oriented X 3, normal strength and tone. Normal symmetric reflexes. Normal coordination and gait  ECOG PERFORMANCE STATUS: 0 - Asymptomatic  Blood pressure 130/82, pulse 90, temperature 98.5 F (36.9 C), temperature source Oral, resp. rate 18, height 5\' 7"  (1.702 m), weight 177 lb 9.6 oz (80.6 kg), SpO2 98 %.  LABORATORY DATA: Lab Results  Component Value Date   WBC 4.8 03/16/2019   HGB 12.7 03/16/2019   HCT 39.3 03/16/2019   MCV 88.3 03/16/2019   PLT 327 03/16/2019      Chemistry      Component Value Date/Time   NA 142 03/02/2019 1254   NA 144 04/02/2017 1435   K 3.5 03/02/2019 1254   CL 106 03/02/2019 1254   CO2 28 03/02/2019 1254   BUN 15 03/02/2019 1254   BUN 11 04/02/2017 1435   CREATININE 1.01 (H) 03/02/2019 1254   CREATININE 0.87 03/22/2016 0908      Component Value Date/Time   CALCIUM 9.6 03/02/2019 1254   ALKPHOS 115 03/02/2019 1254   AST 15 03/02/2019 1254   ALT 10 03/02/2019 1254   BILITOT 0.5 03/02/2019 1254       RADIOGRAPHIC STUDIES: Ct Chest W Contrast  Result  Date: 03/11/2019 CLINICAL DATA:  Restaging lung cancer EXAM: CT CHEST WITH CONTRAST TECHNIQUE: Multidetector CT imaging of the chest was performed during intravenous contrast administration. CONTRAST:  29mL OMNIPAQUE IOHEXOL 300 MG/ML  SOLN COMPARISON:  01/04/2019, 09/25/2018, PET-CT, 07/02/2018 FINDINGS: Cardiovascular: Aortic atherosclerosis. Normal heart size. Extensive 3 vessel coronary artery calcifications. Trace pericardial effusion. Mediastinum/Nodes: Unchanged post treatment appearance of right hilar soft tissue without discretely enlarged mediastinal lymph nodes. Redemonstrated, heterogeneous and partially calcified hypodense nodule of the left lobe of the thyroid measuring 2.6 cm (series 2, image 5). Trachea, and esophagus demonstrate no significant findings. Lungs/Pleura: Redemonstrated post treatment appearance of the right chest with reduction in size of a primary anterior right upper lobe mass measuring 4.2 x 1.3 cm, previously 4.6 x 1.7 cm when measured similarly (series 7, image 45). There is no significant interval change in adjacent irregular, more bandlike scarring and fibrotic volume loss. There is a background of new, extensive centrilobular nodularity of the right upper and right middle lobes, with sparing of the right lung base and left lung. Mild centrilobular emphysema. Upper Abdomen: No acute abnormality. Musculoskeletal: No chest wall mass or suspicious bone lesions identified. IMPRESSION: 1. Redemonstrated post treatment appearance of the right chest with reduction in size of  a primary anterior right upper lobe mass measuring 4.2 x 1.3 cm, previously 4.6 x 1.7 cm when measured similarly (series 7, image 45), consistent with continued treatment response. There is no significant interval change in adjacent irregular, more bandlike scarring and fibrotic volume loss, generally consistent with developing radiation change. 2. There is a background of new, extensive centrilobular nodularity of  the right upper and right middle lobes, with sparing of the right lung base and left lung. Generally favor nonspecific infection or inflammation, metastatic disease much less likely. Attention on follow-up. 3. Unchanged post treatment appearance of right hilar soft tissue without discretely enlarged mediastinal lymph nodes. 4.  Emphysema (ICD10-J43.9). 5.  Coronary artery disease.  Aortic Atherosclerosis (ICD10-I70.0). 6. Redemonstrated, heterogeneous and partially calcified hypodense nodule of the left lobe of the thyroid measuring 2.6 cm (series 2, image 5). Ultrasound evaluation recommended when clinically appropriate. Electronically Signed   By: Eddie Candle M.D.   On: 03/11/2019 11:01    ASSESSMENT AND PLAN: This is a very pleasant 73 years old African-American female recently diagnosed with a stage IIIB (T4, N2, M0) non-small cell lung cancer, poorly differentiated squamous cell carcinoma presented with right upper lobe lung mass in addition to right hilar and mediastinal lymphadenopathy. The patient underwent concurrent chemoradiation with weekly carboplatin and paclitaxel status post 8 cycles. She has partial response to this treatment. The patient was started on consolidation treatment with immunotherapy with Imfinzi 10 mg/KG every 2 weeks status post 11 cycles.  The patient has been tolerating this treatment well with no concerning adverse effects. She had repeat CT scan of the chest performed recently.  I personally and independently reviewed the scans and discussed the results with the patient today. Her scan showed no concerning findings for disease progression and the areas seen on the previous scan were consistent with more inflammatory process than disease progression. I recommended for the patient to continue her current treatment with Imfinzi and she will proceed with cycle #12 today. I will see her back for follow-up visit in 2 weeks for evaluation before the next cycle of her  treatment. The patient was advised to call immediately if she has any concerning symptoms in the interval. The patient voices understanding of current disease status and treatment options and is in agreement with the current care plan.  All questions were answered. The patient knows to call the clinic with any problems, questions or concerns. We can certainly see the patient much sooner if necessary.  Disclaimer: This note was dictated with voice recognition software. Similar sounding words can inadvertently be transcribed and may not be corrected upon review.

## 2019-03-16 NOTE — Patient Instructions (Signed)
[facility] Discharge Instructions for Patients Receiving Chemotherapy  Today you received the following chemotherapy agents: Imfinzi.  To help prevent nausea and vomiting after your treatment, we encourage you to take your nausea medication as directed.   If you develop nausea and vomiting that is not controlled by your nausea medication, call the clinic.   BELOW ARE SYMPTOMS THAT SHOULD BE REPORTED IMMEDIATELY:  *FEVER GREATER THAN 100.5 F  *CHILLS WITH OR WITHOUT FEVER  NAUSEA AND VOMITING THAT IS NOT CONTROLLED WITH YOUR NAUSEA MEDICATION  *UNUSUAL SHORTNESS OF BREATH  *UNUSUAL BRUISING OR BLEEDING  TENDERNESS IN MOUTH AND THROAT WITH OR WITHOUT PRESENCE OF ULCERS  *URINARY PROBLEMS  *BOWEL PROBLEMS  UNUSUAL RASH Items with * indicate a potential emergency and should be followed up as soon as possible.  Feel free to call the clinic should you have any questions or concerns. The clinic phone number is [phone].  Please show the CHEMO ALERT CARD at check-in to the Emergency Department and triage nurse.   

## 2019-03-17 ENCOUNTER — Telehealth: Payer: Self-pay | Admitting: *Deleted

## 2019-03-17 ENCOUNTER — Telehealth: Payer: Self-pay | Admitting: Internal Medicine

## 2019-03-17 NOTE — Telephone Encounter (Signed)
Scheduled appt per 9/29 los - spoke with pt and she is aware of appt date and time

## 2019-03-17 NOTE — Telephone Encounter (Signed)
Patient called - needs to r/s appointments scheduled on 10/27 to either earlier in the day or early on another day - she has to be at work at 4:30 and has to rest between infusion and going to work. Message sent to scheduling with this information

## 2019-03-30 ENCOUNTER — Inpatient Hospital Stay: Payer: BC Managed Care – PPO | Attending: Internal Medicine

## 2019-03-30 ENCOUNTER — Other Ambulatory Visit: Payer: Self-pay

## 2019-03-30 ENCOUNTER — Inpatient Hospital Stay (HOSPITAL_BASED_OUTPATIENT_CLINIC_OR_DEPARTMENT_OTHER): Payer: BC Managed Care – PPO | Admitting: Physician Assistant

## 2019-03-30 ENCOUNTER — Inpatient Hospital Stay: Payer: BC Managed Care – PPO

## 2019-03-30 VITALS — BP 138/93 | HR 80 | Temp 98.5°F | Resp 16 | Ht 67.0 in | Wt 174.7 lb

## 2019-03-30 DIAGNOSIS — C3411 Malignant neoplasm of upper lobe, right bronchus or lung: Secondary | ICD-10-CM | POA: Insufficient documentation

## 2019-03-30 DIAGNOSIS — Z5112 Encounter for antineoplastic immunotherapy: Secondary | ICD-10-CM

## 2019-03-30 DIAGNOSIS — I10 Essential (primary) hypertension: Secondary | ICD-10-CM | POA: Insufficient documentation

## 2019-03-30 DIAGNOSIS — K219 Gastro-esophageal reflux disease without esophagitis: Secondary | ICD-10-CM | POA: Diagnosis not present

## 2019-03-30 DIAGNOSIS — Z923 Personal history of irradiation: Secondary | ICD-10-CM | POA: Diagnosis not present

## 2019-03-30 DIAGNOSIS — E785 Hyperlipidemia, unspecified: Secondary | ICD-10-CM | POA: Insufficient documentation

## 2019-03-30 DIAGNOSIS — Z87891 Personal history of nicotine dependence: Secondary | ICD-10-CM | POA: Insufficient documentation

## 2019-03-30 DIAGNOSIS — C3491 Malignant neoplasm of unspecified part of right bronchus or lung: Secondary | ICD-10-CM | POA: Diagnosis not present

## 2019-03-30 DIAGNOSIS — Z7982 Long term (current) use of aspirin: Secondary | ICD-10-CM | POA: Insufficient documentation

## 2019-03-30 DIAGNOSIS — Z79899 Other long term (current) drug therapy: Secondary | ICD-10-CM | POA: Insufficient documentation

## 2019-03-30 DIAGNOSIS — R590 Localized enlarged lymph nodes: Secondary | ICD-10-CM | POA: Diagnosis not present

## 2019-03-30 LAB — CMP (CANCER CENTER ONLY)
ALT: 14 U/L (ref 0–44)
AST: 15 U/L (ref 15–41)
Albumin: 3.6 g/dL (ref 3.5–5.0)
Alkaline Phosphatase: 106 U/L (ref 38–126)
Anion gap: 10 (ref 5–15)
BUN: 13 mg/dL (ref 8–23)
CO2: 28 mmol/L (ref 22–32)
Calcium: 9.4 mg/dL (ref 8.9–10.3)
Chloride: 105 mmol/L (ref 98–111)
Creatinine: 0.95 mg/dL (ref 0.44–1.00)
GFR, Est AFR Am: >60 mL/min (ref >60)
GFR, Estimated: >60 mL/min (ref >60)
Glucose, Bld: 113 mg/dL — ABNORMAL HIGH (ref 70–99)
Potassium: 3.4 mmol/L — ABNORMAL LOW (ref 3.5–5.1)
Sodium: 143 mmol/L (ref 135–145)
Total Bilirubin: 0.6 mg/dL (ref 0.3–1.2)
Total Protein: 7 g/dL (ref 6.5–8.1)

## 2019-03-30 LAB — CBC WITH DIFFERENTIAL (CANCER CENTER ONLY)
Abs Immature Granulocytes: 0.01 10*3/uL (ref 0.00–0.07)
Basophils Absolute: 0.1 10*3/uL (ref 0.0–0.1)
Basophils Relative: 1 %
Eosinophils Absolute: 0.1 10*3/uL (ref 0.0–0.5)
Eosinophils Relative: 2 %
HCT: 37.9 % (ref 36.0–46.0)
Hemoglobin: 12.3 g/dL (ref 12.0–15.0)
Immature Granulocytes: 0 %
Lymphocytes Relative: 18 %
Lymphs Abs: 0.8 10*3/uL (ref 0.7–4.0)
MCH: 28.9 pg (ref 26.0–34.0)
MCHC: 32.5 g/dL (ref 30.0–36.0)
MCV: 89.2 fL (ref 80.0–100.0)
Monocytes Absolute: 0.3 10*3/uL (ref 0.1–1.0)
Monocytes Relative: 8 %
Neutro Abs: 3 10*3/uL (ref 1.7–7.7)
Neutrophils Relative %: 71 %
Platelet Count: 332 10*3/uL (ref 150–400)
RBC: 4.25 MIL/uL (ref 3.87–5.11)
RDW: 16 % — ABNORMAL HIGH (ref 11.5–15.5)
WBC Count: 4.3 10*3/uL (ref 4.0–10.5)
nRBC: 0 % (ref 0.0–0.2)

## 2019-03-30 LAB — TSH: TSH: 1.384 u[IU]/mL (ref 0.308–3.960)

## 2019-03-30 MED ORDER — SODIUM CHLORIDE 0.9 % IV SOLN
Freq: Once | INTRAVENOUS | Status: AC
  Administered 2019-03-30: 12:00:00 via INTRAVENOUS
  Filled 2019-03-30: qty 250

## 2019-03-30 MED ORDER — SODIUM CHLORIDE 0.9 % IV SOLN
9.6000 mg/kg | Freq: Once | INTRAVENOUS | Status: AC
  Administered 2019-03-30: 740 mg via INTRAVENOUS
  Filled 2019-03-30: qty 10

## 2019-03-30 NOTE — Progress Notes (Signed)
[place] OFFICE PROGRESS NOTE  [name], MD [address] [place] [address]  DIAGNOSIS: Stage IIIB(T4, N2, M0) non-small cell lung cancer, poorly differentiated squamous cell carcinoma. She presented with large right upper lobe lung mass in addition to right hilar and mediastinal lymphadenopathy diagnosed in January 2020.  PRIOR THERAPY: Concurrent chemoradiation with weekly carboplatin for AUC of 2 and paclitaxel 45 mg/M2. First dose July 13, 2018. Status post 8 cycles. Last dose was given August 31, 2018.  CURRENT THERAPY: Consolidation treatment with immunotherapy with Imfinzi 10 mg/KG every 2 weeks. First dose starts October 13, 2018. Status post12cycles.  INTERVAL HISTORY: Christina [name] 73 y.o. female returns to the clinic for a follow up visit. The patient is feeling well today without any concerning complaints. The patient continues to tolerate treatment with immunotherapy with imfinzi well without any adverse side effects. Denies any fever, chills, night sweats, or weight loss. Denies any chest pain, shortness of breath, cough, or hemoptysis. Denies any nausea, vomiting, diarrhea, or constipation. Denies any headache or visual changes. Denies any rashes or skin changes. The patient is here today for evaluation prior to starting cycle # 13.  MEDICAL HISTORY: Past Medical History:  Diagnosis Date  . Allergy   . Cancer ([place])    lung cancer  . GERD (gastroesophageal reflux disease)   . Hyperlipidemia   . Hypertension   . Reflux   . Tubulovillous adenoma of colon 2003    ALLERGIES:  is allergic to coconut oil; codeine; shrimp [shellfish allergy]; and trandolapril.  MEDICATIONS:  Current Outpatient Medications  Medication Sig Dispense Refill  . albuterol (PROAIR HFA) 108 (90 Base) MCG/ACT inhaler Inhale 2 puffs into the lungs every 6 (six) hours as needed for wheezing or shortness of breath. 1 Inhaler 3  .  amLODipine (NORVASC) 10 MG tablet TAKE 1 TABLET(10 MG) BY MOUTH DAILY 90 tablet 3  . aspirin EC 81 MG tablet Take 81 mg by mouth daily.    Marland Kitchen atorvastatin (LIPITOR) 40 MG tablet TAKE 1 TABLET BY MOUTH DAILY 90 tablet 3  . omeprazole (PRILOSEC) 40 MG capsule TAKE 1 CAPSULE(40 MG) BY MOUTH DAILY 90 capsule 3  . potassium chloride (K-DUR) 10 MEQ tablet Take 1 tablet (10 mEq total) by mouth 2 (two) times daily. 60 tablet 3  . sucralfate (CARAFATE) 1 g tablet TAKE 1 TABLET BY MOUTH 1 HOUR BEFORE MEALS AND AT BEDTIME AS NEEDED (Patient taking differently: Take 1 g by mouth 4 (four) times daily as needed (stomach acid). ) 120 tablet prn  . HYDROcodone-acetaminophen (HYCET) 7.5-325 mg/15 ml solution Take 15 mLs by mouth 4 (four) times daily as needed. (Patient not taking: Reported on 01/05/2019) 473 mL 0  . prochlorperazine (COMPAZINE) 10 MG tablet Take 1 tablet (10 mg total) by mouth every 6 (six) hours as needed for nausea or vomiting. (Patient not taking: Reported on 01/19/2019) 30 tablet 0   No current facility-administered medications for this visit.    Facility-Administered Medications Ordered in Other Visits  Medication Dose Route Frequency Provider Last Rate Last Dose  . durvalumab (IMFINZI) 740 mg in sodium chloride 0.9 % 100 mL chemo infusion  9.6 mg/kg (Treatment Plan Recorded) Intravenous Once Curt Bears, MD 115 mL/hr at 03/30/19 1202 740 mg at 03/30/19 1202    SURGICAL HISTORY:  Past Surgical History:  Procedure Laterality Date  . CESAREAN SECTION     1 time  . COLONOSCOPY    .  VIDEO BRONCHOSCOPY WITH ENDOBRONCHIAL NAVIGATION N/A 06/24/2018   Procedure: VIDEO BRONCHOSCOPY WITH ENDOBRONCHIAL NAVIGATION with biopsies, right upper lung lobe;  Surgeon: Melrose Nakayama, MD;  Location: [facility] OR;  Service: Thoracic;  Laterality: N/A;  . VIDEO BRONCHOSCOPY WITH ENDOBRONCHIAL ULTRASOUND N/A 06/24/2018   Procedure: VIDEO BRONCHOSCOPY WITH ENDOBRONCHIAL ULTRASOUND, right upper lung lobe;   Surgeon: Melrose Nakayama, MD;  Location: [facility] OR;  Service: Thoracic;  Laterality: N/A;    REVIEW OF SYSTEMS:   Review of Systems  Constitutional: Negative for appetite change, chills, fatigue, fever and unexpected weight change.  HENT:   Negative for mouth sores, nosebleeds, sore throat and trouble swallowing.   Eyes: Negative for eye problems and icterus.  Respiratory: Negative for cough, hemoptysis, shortness of breath and wheezing.   Cardiovascular: Negative for chest pain and leg swelling.  Gastrointestinal: Negative for abdominal pain, constipation, diarrhea, nausea and vomiting.  Genitourinary: Negative for bladder incontinence, difficulty urinating, dysuria, frequency and hematuria.   Musculoskeletal: Negative for back pain, gait problem, neck pain and neck stiffness.  Skin: Negative for itching and rash.  Neurological: Negative for dizziness, extremity weakness, gait problem, headaches, light-headedness and seizures.  Hematological: Negative for adenopathy. Does not bruise/bleed easily.  Psychiatric/Behavioral: Negative for confusion, depression and sleep disturbance. The patient is not nervous/anxious.     PHYSICAL EXAMINATION:  Blood pressure (!) 138/93, pulse 80, temperature 98.5 F (36.9 C), resp. rate 16, height 5\' 7"  (1.702 m), weight 174 lb 11.2 oz (79.2 kg), SpO2 99 %.  ECOG PERFORMANCE STATUS: 0 - Asymptomatic  Physical Exam  Constitutional: Oriented to person, place, and time and well-developed, well-nourished, and in no distress.  HENT:  Head: Normocephalic and atraumatic.  Mouth/Throat: Oropharynx is clear and moist. No oropharyngeal exudate.  Eyes: Conjunctivae are normal. Right eye exhibits no discharge. Left eye exhibits no discharge. No scleral icterus.  Neck: Normal range of motion. Neck supple.  Cardiovascular: Normal rate, regular rhythm, normal heart sounds and intact distal pulses.   Pulmonary/Chest: Effort normal and breath sounds normal. No  respiratory distress. No wheezes. No rales.  Abdominal: Soft. Bowel sounds are normal. Exhibits no distension and no mass. There is no tenderness.  Musculoskeletal: Normal range of motion. Exhibits no edema.  Lymphadenopathy:    No cervical adenopathy.  Neurological: Alert and oriented to person, place, and time. Exhibits normal muscle tone. Gait normal. Coordination normal.  Skin: Skin is warm and dry. No rash noted. Not diaphoretic. No erythema. No pallor.  Psychiatric: Mood, memory and judgment normal.  Vitals reviewed.  LABORATORY DATA: Lab Results  Component Value Date   WBC 4.3 03/30/2019   HGB 12.3 03/30/2019   HCT 37.9 03/30/2019   MCV 89.2 03/30/2019   PLT 332 03/30/2019      Chemistry      Component Value Date/Time   NA 143 03/30/2019 1013   NA 144 04/02/2017 1435   K 3.4 (L) 03/30/2019 1013   CL 105 03/30/2019 1013   CO2 28 03/30/2019 1013   BUN 13 03/30/2019 1013   BUN 11 04/02/2017 1435   CREATININE 0.95 03/30/2019 1013   CREATININE 0.87 03/22/2016 0908      Component Value Date/Time   CALCIUM 9.4 03/30/2019 1013   ALKPHOS 106 03/30/2019 1013   AST 15 03/30/2019 1013   ALT 14 03/30/2019 1013   BILITOT 0.6 03/30/2019 1013       RADIOGRAPHIC STUDIES:  Ct Chest W Contrast  Result Date: 03/11/2019 CLINICAL DATA:  Restaging lung cancer EXAM:  CT CHEST WITH CONTRAST TECHNIQUE: Multidetector CT imaging of the chest was performed during intravenous contrast administration. CONTRAST:  56mL OMNIPAQUE IOHEXOL 300 MG/ML  SOLN COMPARISON:  01/04/2019, 09/25/2018, PET-CT, 07/02/2018 FINDINGS: Cardiovascular: Aortic atherosclerosis. Normal heart size. Extensive 3 vessel coronary artery calcifications. Trace pericardial effusion. Mediastinum/Nodes: Unchanged post treatment appearance of right hilar soft tissue without discretely enlarged mediastinal lymph nodes. Redemonstrated, heterogeneous and partially calcified hypodense nodule of the left lobe of the thyroid measuring  2.6 cm (series 2, image 5). Trachea, and esophagus demonstrate no significant findings. Lungs/Pleura: Redemonstrated post treatment appearance of the right chest with reduction in size of a primary anterior right upper lobe mass measuring 4.2 x 1.3 cm, previously 4.6 x 1.7 cm when measured similarly (series 7, image 45). There is no significant interval change in adjacent irregular, more bandlike scarring and fibrotic volume loss. There is a background of new, extensive centrilobular nodularity of the right upper and right middle lobes, with sparing of the right lung base and left lung. Mild centrilobular emphysema. Upper Abdomen: No acute abnormality. Musculoskeletal: No chest wall mass or suspicious bone lesions identified. IMPRESSION: 1. Redemonstrated post treatment appearance of the right chest with reduction in size of a primary anterior right upper lobe mass measuring 4.2 x 1.3 cm, previously 4.6 x 1.7 cm when measured similarly (series 7, image 45), consistent with continued treatment response. There is no significant interval change in adjacent irregular, more bandlike scarring and fibrotic volume loss, generally consistent with developing radiation change. 2. There is a background of new, extensive centrilobular nodularity of the right upper and right middle lobes, with sparing of the right lung base and left lung. Generally favor nonspecific infection or inflammation, metastatic disease much less likely. Attention on follow-up. 3. Unchanged post treatment appearance of right hilar soft tissue without discretely enlarged mediastinal lymph nodes. 4.  Emphysema (ICD10-J43.9). 5.  Coronary artery disease.  Aortic Atherosclerosis (ICD10-I70.0). 6. Redemonstrated, heterogeneous and partially calcified hypodense nodule of the left lobe of the thyroid measuring 2.6 cm (series 2, image 5). Ultrasound evaluation recommended when clinically appropriate. Electronically Signed   By: Eddie Candle M.D.   On: 03/11/2019  11:01     ASSESSMENT/PLAN:  This is a very pleasant 73 year old African-American female diagnosed with stage IIIb(T4, N2, M0) non-small cell lung cancer, poorly differentiated squamous cell carcinoma. She presented with a right upper lung mass in addition to right hilar and mediastinal lymphadenopathy. She was diagnosed in January 2020.  The patient previously received concurrent chemoradiation with weekly carboplatin for an AUC of 2 and paclitaxel 45 mg/m. She is status post 8 cycles.  The patient is currently undergoing consolidation immunotherapy with Imfinzi 10 mg/kg IV every 2 weeks. She is status post12cycles of treatment. She has been tolerating it well without any adverse side effects.   Labs were reviewed with the patient. I recommend that she proceed with cycle #13 today as scheduled.   We will see her back for a follow up visit in 2 weeks for evaluation before starting cycle #14.   The patient was advised to call immediately if she has any concerning symptoms in the interval. The patient voices understanding of current disease status and treatment options and is in agreement with the current care plan. All questions were answered. The patient knows to call the clinic with any problems, questions or concerns. We can certainly see the patient much sooner if necessary  No orders of the defined types were placed in this encounter.  Breona Cherubin L Marytza Grandpre, PA-C 03/30/19

## 2019-03-30 NOTE — Patient Instructions (Signed)
[facility] Discharge Instructions for Patients Receiving Chemotherapy  Today you received the following chemotherapy agents: Imfinzi.  To help prevent nausea and vomiting after your treatment, we encourage you to take your nausea medication as directed.   If you develop nausea and vomiting that is not controlled by your nausea medication, call the clinic.   BELOW ARE SYMPTOMS THAT SHOULD BE REPORTED IMMEDIATELY:  *FEVER GREATER THAN 100.5 F  *CHILLS WITH OR WITHOUT FEVER  NAUSEA AND VOMITING THAT IS NOT CONTROLLED WITH YOUR NAUSEA MEDICATION  *UNUSUAL SHORTNESS OF BREATH  *UNUSUAL BRUISING OR BLEEDING  TENDERNESS IN MOUTH AND THROAT WITH OR WITHOUT PRESENCE OF ULCERS  *URINARY PROBLEMS  *BOWEL PROBLEMS  UNUSUAL RASH Items with * indicate a potential emergency and should be followed up as soon as possible.  Feel free to call the clinic should you have any questions or concerns. The clinic phone number is [phone].  Please show the CHEMO ALERT CARD at check-in to the Emergency Department and triage nurse.   

## 2019-03-31 ENCOUNTER — Telehealth: Payer: Self-pay | Admitting: Internal Medicine

## 2019-03-31 NOTE — Telephone Encounter (Signed)
Scheduled appt per 10/13 los - pt to get an updated schedule next visit.

## 2019-04-13 ENCOUNTER — Other Ambulatory Visit: Payer: BC Managed Care – PPO

## 2019-04-13 ENCOUNTER — Inpatient Hospital Stay (HOSPITAL_BASED_OUTPATIENT_CLINIC_OR_DEPARTMENT_OTHER): Payer: BC Managed Care – PPO | Admitting: Internal Medicine

## 2019-04-13 ENCOUNTER — Telehealth: Payer: Self-pay | Admitting: Internal Medicine

## 2019-04-13 ENCOUNTER — Other Ambulatory Visit: Payer: Self-pay

## 2019-04-13 ENCOUNTER — Inpatient Hospital Stay: Payer: BC Managed Care – PPO

## 2019-04-13 ENCOUNTER — Encounter: Payer: Self-pay | Admitting: Internal Medicine

## 2019-04-13 ENCOUNTER — Ambulatory Visit: Payer: BC Managed Care – PPO | Admitting: Internal Medicine

## 2019-04-13 ENCOUNTER — Ambulatory Visit: Payer: BC Managed Care – PPO

## 2019-04-13 ENCOUNTER — Other Ambulatory Visit: Payer: Self-pay | Admitting: Internal Medicine

## 2019-04-13 VITALS — BP 133/85 | HR 95 | Temp 98.2°F | Resp 18 | Ht 67.0 in | Wt 174.8 lb

## 2019-04-13 DIAGNOSIS — C3491 Malignant neoplasm of unspecified part of right bronchus or lung: Secondary | ICD-10-CM

## 2019-04-13 DIAGNOSIS — C3411 Malignant neoplasm of upper lobe, right bronchus or lung: Secondary | ICD-10-CM | POA: Diagnosis not present

## 2019-04-13 DIAGNOSIS — K219 Gastro-esophageal reflux disease without esophagitis: Secondary | ICD-10-CM | POA: Diagnosis not present

## 2019-04-13 DIAGNOSIS — Z7982 Long term (current) use of aspirin: Secondary | ICD-10-CM | POA: Diagnosis not present

## 2019-04-13 DIAGNOSIS — Z79899 Other long term (current) drug therapy: Secondary | ICD-10-CM | POA: Diagnosis not present

## 2019-04-13 DIAGNOSIS — Z923 Personal history of irradiation: Secondary | ICD-10-CM | POA: Diagnosis not present

## 2019-04-13 DIAGNOSIS — Z5112 Encounter for antineoplastic immunotherapy: Secondary | ICD-10-CM | POA: Diagnosis not present

## 2019-04-13 DIAGNOSIS — I10 Essential (primary) hypertension: Secondary | ICD-10-CM | POA: Diagnosis not present

## 2019-04-13 DIAGNOSIS — Z87891 Personal history of nicotine dependence: Secondary | ICD-10-CM | POA: Diagnosis not present

## 2019-04-13 DIAGNOSIS — R590 Localized enlarged lymph nodes: Secondary | ICD-10-CM | POA: Diagnosis not present

## 2019-04-13 DIAGNOSIS — E785 Hyperlipidemia, unspecified: Secondary | ICD-10-CM | POA: Diagnosis not present

## 2019-04-13 LAB — CMP (CANCER CENTER ONLY)
ALT: 14 U/L (ref 0–44)
AST: 14 U/L — ABNORMAL LOW (ref 15–41)
Albumin: 3.6 g/dL (ref 3.5–5.0)
Alkaline Phosphatase: 105 U/L (ref 38–126)
Anion gap: 10 (ref 5–15)
BUN: 13 mg/dL (ref 8–23)
CO2: 26 mmol/L (ref 22–32)
Calcium: 9.1 mg/dL (ref 8.9–10.3)
Chloride: 106 mmol/L (ref 98–111)
Creatinine: 0.94 mg/dL (ref 0.44–1.00)
GFR, Est AFR Am: >60 mL/min (ref >60)
GFR, Estimated: >60 mL/min (ref >60)
Glucose, Bld: 109 mg/dL — ABNORMAL HIGH (ref 70–99)
Potassium: 3.1 mmol/L — ABNORMAL LOW (ref 3.5–5.1)
Sodium: 142 mmol/L (ref 135–145)
Total Bilirubin: 0.5 mg/dL (ref 0.3–1.2)
Total Protein: 7.1 g/dL (ref 6.5–8.1)

## 2019-04-13 LAB — CBC WITH DIFFERENTIAL (CANCER CENTER ONLY)
Abs Immature Granulocytes: 0.02 10*3/uL (ref 0.00–0.07)
Basophils Absolute: 0.1 10*3/uL (ref 0.0–0.1)
Basophils Relative: 1 %
Eosinophils Absolute: 0.2 10*3/uL (ref 0.0–0.5)
Eosinophils Relative: 3 %
HCT: 38.1 % (ref 36.0–46.0)
Hemoglobin: 12.6 g/dL (ref 12.0–15.0)
Immature Granulocytes: 0 %
Lymphocytes Relative: 20 %
Lymphs Abs: 1 10*3/uL (ref 0.7–4.0)
MCH: 28.8 pg (ref 26.0–34.0)
MCHC: 33.1 g/dL (ref 30.0–36.0)
MCV: 87.2 fL (ref 80.0–100.0)
Monocytes Absolute: 0.5 10*3/uL (ref 0.1–1.0)
Monocytes Relative: 10 %
Neutro Abs: 3.3 10*3/uL (ref 1.7–7.7)
Neutrophils Relative %: 66 %
Platelet Count: 311 10*3/uL (ref 150–400)
RBC: 4.37 MIL/uL (ref 3.87–5.11)
RDW: 15.9 % — ABNORMAL HIGH (ref 11.5–15.5)
WBC Count: 5 10*3/uL (ref 4.0–10.5)
nRBC: 0 % (ref 0.0–0.2)

## 2019-04-13 MED ORDER — SODIUM CHLORIDE 0.9 % IV SOLN
Freq: Once | INTRAVENOUS | Status: AC
  Administered 2019-04-13: 10:00:00 via INTRAVENOUS
  Filled 2019-04-13: qty 250

## 2019-04-13 MED ORDER — SODIUM CHLORIDE 0.9 % IV SOLN
9.6000 mg/kg | Freq: Once | INTRAVENOUS | Status: AC
  Administered 2019-04-13: 740 mg via INTRAVENOUS
  Filled 2019-04-13: qty 10

## 2019-04-13 NOTE — Progress Notes (Signed)
[facility] [phone]   [phone]  OFFICE PROGRESS NOTE  [name], MD [address] - [place] [address]  DIAGNOSIS: Stage IIIB (T4, N2, M0) non-small cell lung cancer, poorly differentiated squamous cell carcinoma.  She presented with large right upper lobe lung mass in addition to right hilar and mediastinal lymphadenopathy diagnosed in January 2020.  PRIOR THERAPY: Concurrent chemoradiation with weekly carboplatin for AUC of 2 and paclitaxel 45 mg/M2.  First dose July 13, 2018.  Status post 8 cycles.  Last dose was given August 31, 2018.  CURRENT THERAPY: Consolidation treatment with immunotherapy with Imfinzi 10 mg/KG every 2 weeks.  First dose starts October 13, 2018.  Status post 13 cycles.  INTERVAL HISTORY: Christina [name] 73 y.o. female returns to the clinic today for follow-up visit.  The patient is feeling fine today with no concerning complaints.  She denied having any chest pain, shortness of breath, cough or hemoptysis.  She has no skin rash or diarrhea.  She denied having any nausea, vomiting or constipation.  She has no headache or visual changes.  She has no weight loss or night sweats.  She is here today for evaluation before starting cycle #14.   MEDICAL HISTORY: Past Medical History:  Diagnosis Date  . Allergy   . Cancer ([place])    lung cancer  . GERD (gastroesophageal reflux disease)   . Hyperlipidemia   . Hypertension   . Reflux   . Tubulovillous adenoma of colon 2003    ALLERGIES:  is allergic to coconut oil; codeine; shrimp [shellfish allergy]; and trandolapril.  MEDICATIONS:  Current Outpatient Medications  Medication Sig Dispense Refill  . albuterol (PROAIR HFA) 108 (90 Base) MCG/ACT inhaler Inhale 2 puffs into the lungs every 6 (six) hours as needed for wheezing or shortness of breath. 1 Inhaler 3  . amLODipine (NORVASC) 10 MG tablet TAKE 1 TABLET(10 MG) BY MOUTH DAILY 90  tablet 3  . aspirin EC 81 MG tablet Take 81 mg by mouth daily.    Marland Kitchen atorvastatin (LIPITOR) 40 MG tablet TAKE 1 TABLET BY MOUTH DAILY 90 tablet 3  . HYDROcodone-acetaminophen (HYCET) 7.5-325 mg/15 ml solution Take 15 mLs by mouth 4 (four) times daily as needed. (Patient not taking: Reported on 01/05/2019) 473 mL 0  . omeprazole (PRILOSEC) 40 MG capsule TAKE 1 CAPSULE(40 MG) BY MOUTH DAILY 90 capsule 3  . potassium chloride (K-DUR) 10 MEQ tablet Take 1 tablet (10 mEq total) by mouth 2 (two) times daily. 60 tablet 3  . prochlorperazine (COMPAZINE) 10 MG tablet Take 1 tablet (10 mg total) by mouth every 6 (six) hours as needed for nausea or vomiting. (Patient not taking: Reported on 01/19/2019) 30 tablet 0  . sucralfate (CARAFATE) 1 g tablet TAKE 1 TABLET BY MOUTH 1 HOUR BEFORE MEALS AND AT BEDTIME AS NEEDED (Patient taking differently: Take 1 g by mouth 4 (four) times daily as needed (stomach acid). ) 120 tablet prn   No current facility-administered medications for this visit.     SURGICAL HISTORY:  Past Surgical History:  Procedure Laterality Date  . CESAREAN SECTION     1 time  . COLONOSCOPY    . VIDEO BRONCHOSCOPY WITH ENDOBRONCHIAL NAVIGATION N/A 06/24/2018   Procedure: VIDEO BRONCHOSCOPY WITH ENDOBRONCHIAL NAVIGATION with biopsies, right upper lung lobe;  Surgeon: Melrose Nakayama, MD;  Location: [facility] OR;  Service: Thoracic;  Laterality: N/A;  . VIDEO BRONCHOSCOPY WITH  ENDOBRONCHIAL ULTRASOUND N/A 06/24/2018   Procedure: VIDEO BRONCHOSCOPY WITH ENDOBRONCHIAL ULTRASOUND, right upper lung lobe;  Surgeon: Melrose Nakayama, MD;  [facility] OR;  Service: Thoracic;  Laterality: N/A;    REVIEW OF SYSTEMS:  A comprehensive review of systems was negative.   PHYSICAL EXAMINATION: General appearance: alert, cooperative and no distress Head: Normocephalic, without obvious abnormality, atraumatic Neck: no adenopathy, no JVD, supple, symmetrical, trachea midline and thyroid not enlarged,  symmetric, no tenderness/mass/nodules Lymph nodes: Cervical, supraclavicular, and axillary nodes normal. Resp: clear to auscultation bilaterally Back: symmetric, no curvature. ROM normal. No CVA tenderness. Cardio: regular rate and rhythm, S1, S2 normal, no murmur, click, rub or gallop GI: soft, non-tender; bowel sounds normal; no masses,  no organomegaly Extremities: extremities normal, atraumatic, no cyanosis or edema  ECOG PERFORMANCE STATUS: 0 - Asymptomatic  Blood pressure 133/85, pulse 95, temperature 98.2 F (36.8 C), temperature source Temporal, resp. rate 18, height 5\' 7"  (1.702 m), weight 174 lb 12.8 oz (79.3 kg), SpO2 99 %.  LABORATORY DATA: Lab Results  Component Value Date   WBC 4.3 03/30/2019   HGB 12.3 03/30/2019   HCT 37.9 03/30/2019   MCV 89.2 03/30/2019   PLT 332 03/30/2019      Chemistry      Component Value Date/Time   NA 143 03/30/2019 1013   NA 144 04/02/2017 1435   K 3.4 (L) 03/30/2019 1013   CL 105 03/30/2019 1013   CO2 28 03/30/2019 1013   BUN 13 03/30/2019 1013   BUN 11 04/02/2017 1435   CREATININE 0.95 03/30/2019 1013   CREATININE 0.87 03/22/2016 0908      Component Value Date/Time   CALCIUM 9.4 03/30/2019 1013   ALKPHOS 106 03/30/2019 1013   AST 15 03/30/2019 1013   ALT 14 03/30/2019 1013   BILITOT 0.6 03/30/2019 1013       RADIOGRAPHIC STUDIES: No results found.  ASSESSMENT AND PLAN: This is a very pleasant 73 years old African-American female recently diagnosed with a stage IIIB (T4, N2, M0) non-small cell lung cancer, poorly differentiated squamous cell carcinoma presented with right upper lobe lung mass in addition to right hilar and mediastinal lymphadenopathy. The patient underwent concurrent chemoradiation with weekly carboplatin and paclitaxel status post 8 cycles. She has partial response to this treatment. The patient was started on consolidation treatment with immunotherapy with Imfinzi 10 mg/KG every 2 weeks status post 13  cycles.   The patient has been tolerating this treatment well with no concerning adverse effects. I recommended for her to proceed with cycle #14 today as planned. I will see her back for follow-up visit in 2 weeks for evaluation before the next cycle of her treatment. She was advised to call immediately if she has any concerning symptoms in the interval. The patient voices understanding of current disease status and treatment options and is in agreement with the current care plan.  All questions were answered. The patient knows to call the clinic with any problems, questions or concerns. We can certainly see the patient much sooner if necessary.  Disclaimer: This note was dictated with voice recognition software. Similar sounding words can inadvertently be transcribed and may not be corrected upon review.

## 2019-04-13 NOTE — Patient Instructions (Signed)
[facility] Discharge Instructions for Patients Receiving Chemotherapy  Today you received the following chemotherapy agents: Imfinzi.  To help prevent nausea and vomiting after your treatment, we encourage you to take your nausea medication as directed.   If you develop nausea and vomiting that is not controlled by your nausea medication, call the clinic.   BELOW ARE SYMPTOMS THAT SHOULD BE REPORTED IMMEDIATELY:  *FEVER GREATER THAN 100.5 F  *CHILLS WITH OR WITHOUT FEVER  NAUSEA AND VOMITING THAT IS NOT CONTROLLED WITH YOUR NAUSEA MEDICATION  *UNUSUAL SHORTNESS OF BREATH  *UNUSUAL BRUISING OR BLEEDING  TENDERNESS IN MOUTH AND THROAT WITH OR WITHOUT PRESENCE OF ULCERS  *URINARY PROBLEMS  *BOWEL PROBLEMS  UNUSUAL RASH Items with * indicate a potential emergency and should be followed up as soon as possible.  Feel free to call the clinic should you have any questions or concerns. The clinic phone number is [phone].  Please show the CHEMO ALERT CARD at check-in to the Emergency Department and triage nurse.   

## 2019-04-13 NOTE — Telephone Encounter (Signed)
Per 10/27 los appts already scheduled.

## 2019-04-14 ENCOUNTER — Telehealth: Payer: Self-pay | Admitting: Medical Oncology

## 2019-04-14 NOTE — Telephone Encounter (Signed)
Christina Bears, MD  Ardeen Garland, RN        She is already on potassium chloride by her primary care physician. She may need to double her dose for the next 7 days. Thank you.    Pt confirmed she is taking KDUR 10 meq BID. I instructed her to take 20 meq bid x 7 days.  SHe voiced understanding.

## 2019-04-27 ENCOUNTER — Inpatient Hospital Stay: Payer: BC Managed Care – PPO | Admitting: Internal Medicine

## 2019-04-27 ENCOUNTER — Encounter: Payer: Self-pay | Admitting: Internal Medicine

## 2019-04-27 ENCOUNTER — Inpatient Hospital Stay: Payer: BC Managed Care – PPO | Attending: Internal Medicine

## 2019-04-27 ENCOUNTER — Other Ambulatory Visit: Payer: Self-pay

## 2019-04-27 ENCOUNTER — Inpatient Hospital Stay: Payer: BC Managed Care – PPO

## 2019-04-27 VITALS — BP 132/93 | HR 94 | Temp 98.7°F | Resp 18 | Ht 67.0 in | Wt 175.7 lb

## 2019-04-27 DIAGNOSIS — Z923 Personal history of irradiation: Secondary | ICD-10-CM | POA: Insufficient documentation

## 2019-04-27 DIAGNOSIS — Z5112 Encounter for antineoplastic immunotherapy: Secondary | ICD-10-CM

## 2019-04-27 DIAGNOSIS — Z87891 Personal history of nicotine dependence: Secondary | ICD-10-CM | POA: Diagnosis not present

## 2019-04-27 DIAGNOSIS — Z7982 Long term (current) use of aspirin: Secondary | ICD-10-CM | POA: Diagnosis not present

## 2019-04-27 DIAGNOSIS — C3491 Malignant neoplasm of unspecified part of right bronchus or lung: Secondary | ICD-10-CM | POA: Diagnosis not present

## 2019-04-27 DIAGNOSIS — Z79899 Other long term (current) drug therapy: Secondary | ICD-10-CM | POA: Insufficient documentation

## 2019-04-27 DIAGNOSIS — K219 Gastro-esophageal reflux disease without esophagitis: Secondary | ICD-10-CM | POA: Insufficient documentation

## 2019-04-27 DIAGNOSIS — C3411 Malignant neoplasm of upper lobe, right bronchus or lung: Secondary | ICD-10-CM | POA: Diagnosis not present

## 2019-04-27 DIAGNOSIS — R590 Localized enlarged lymph nodes: Secondary | ICD-10-CM | POA: Diagnosis not present

## 2019-04-27 DIAGNOSIS — E785 Hyperlipidemia, unspecified: Secondary | ICD-10-CM | POA: Insufficient documentation

## 2019-04-27 DIAGNOSIS — I10 Essential (primary) hypertension: Secondary | ICD-10-CM | POA: Insufficient documentation

## 2019-04-27 LAB — CMP (CANCER CENTER ONLY)
ALT: 14 U/L (ref 0–44)
AST: 14 U/L — ABNORMAL LOW (ref 15–41)
Albumin: 3.8 g/dL (ref 3.5–5.0)
Alkaline Phosphatase: 102 U/L (ref 38–126)
Anion gap: 10 (ref 5–15)
BUN: 12 mg/dL (ref 8–23)
CO2: 26 mmol/L (ref 22–32)
Calcium: 9.3 mg/dL (ref 8.9–10.3)
Chloride: 106 mmol/L (ref 98–111)
Creatinine: 0.99 mg/dL (ref 0.44–1.00)
GFR, Est AFR Am: >60 mL/min (ref >60)
GFR, Estimated: 57 mL/min — ABNORMAL LOW (ref >60)
Glucose, Bld: 123 mg/dL — ABNORMAL HIGH (ref 70–99)
Potassium: 3.4 mmol/L — ABNORMAL LOW (ref 3.5–5.1)
Sodium: 142 mmol/L (ref 135–145)
Total Bilirubin: 0.4 mg/dL (ref 0.3–1.2)
Total Protein: 7.4 g/dL (ref 6.5–8.1)

## 2019-04-27 LAB — CBC WITH DIFFERENTIAL (CANCER CENTER ONLY)
Abs Immature Granulocytes: 0.02 10*3/uL (ref 0.00–0.07)
Basophils Absolute: 0.1 10*3/uL (ref 0.0–0.1)
Basophils Relative: 1 %
Eosinophils Absolute: 0.1 10*3/uL (ref 0.0–0.5)
Eosinophils Relative: 3 %
HCT: 39.2 % (ref 36.0–46.0)
Hemoglobin: 12.6 g/dL (ref 12.0–15.0)
Immature Granulocytes: 0 %
Lymphocytes Relative: 23 %
Lymphs Abs: 1.1 10*3/uL (ref 0.7–4.0)
MCH: 28.6 pg (ref 26.0–34.0)
MCHC: 32.1 g/dL (ref 30.0–36.0)
MCV: 89.1 fL (ref 80.0–100.0)
Monocytes Absolute: 0.4 10*3/uL (ref 0.1–1.0)
Monocytes Relative: 8 %
Neutro Abs: 3.2 10*3/uL (ref 1.7–7.7)
Neutrophils Relative %: 65 %
Platelet Count: 326 10*3/uL (ref 150–400)
RBC: 4.4 MIL/uL (ref 3.87–5.11)
RDW: 15.9 % — ABNORMAL HIGH (ref 11.5–15.5)
WBC Count: 4.9 10*3/uL (ref 4.0–10.5)
nRBC: 0 % (ref 0.0–0.2)

## 2019-04-27 LAB — TSH: TSH: 1.377 u[IU]/mL (ref 0.308–3.960)

## 2019-04-27 MED ORDER — SODIUM CHLORIDE 0.9 % IV SOLN
9.6000 mg/kg | Freq: Once | INTRAVENOUS | Status: AC
  Administered 2019-04-27: 740 mg via INTRAVENOUS
  Filled 2019-04-27: qty 10

## 2019-04-27 MED ORDER — SODIUM CHLORIDE 0.9 % IV SOLN
Freq: Once | INTRAVENOUS | Status: AC
  Administered 2019-04-27: 12:00:00 via INTRAVENOUS
  Filled 2019-04-27: qty 250

## 2019-04-27 NOTE — Patient Instructions (Signed)
[facility] Discharge Instructions for Patients Receiving Chemotherapy  Today you received the following chemotherapy agents: Imfinzi.  To help prevent nausea and vomiting after your treatment, we encourage you to take your nausea medication as directed.   If you develop nausea and vomiting that is not controlled by your nausea medication, call the clinic.   BELOW ARE SYMPTOMS THAT SHOULD BE REPORTED IMMEDIATELY:  *FEVER GREATER THAN 100.5 F  *CHILLS WITH OR WITHOUT FEVER  NAUSEA AND VOMITING THAT IS NOT CONTROLLED WITH YOUR NAUSEA MEDICATION  *UNUSUAL SHORTNESS OF BREATH  *UNUSUAL BRUISING OR BLEEDING  TENDERNESS IN MOUTH AND THROAT WITH OR WITHOUT PRESENCE OF ULCERS  *URINARY PROBLEMS  *BOWEL PROBLEMS  UNUSUAL RASH Items with * indicate a potential emergency and should be followed up as soon as possible.  Feel free to call the clinic should you have any questions or concerns. The clinic phone number is [phone].  Please show the CHEMO ALERT CARD at check-in to the Emergency Department and triage nurse.   

## 2019-04-27 NOTE — Progress Notes (Signed)
[place] [phone]   [phone]  OFFICE PROGRESS NOTE  [name], MD [address] - [place] [address]  DIAGNOSIS: Stage IIIB (T4, N2, M0) non-small cell lung cancer, poorly differentiated squamous cell carcinoma.  She presented with large right upper lobe lung mass in addition to right hilar and mediastinal lymphadenopathy diagnosed in January 2020.  PRIOR THERAPY: Concurrent chemoradiation with weekly carboplatin for AUC of 2 and paclitaxel 45 mg/M2.  First dose July 13, 2018.  Status post 8 cycles.  Last dose was given August 31, 2018.  CURRENT THERAPY: Consolidation treatment with immunotherapy with Imfinzi 10 mg/KG every 2 weeks.  First dose starts October 13, 2018.  Status post 14 cycles.  INTERVAL HISTORY: Christina [name] 73 y.o. female returns to the clinic today for follow-up visit.  The patient is feeling fine today with no concerning complaints.  She denied having any current chest pain, shortness of breath, cough or hemoptysis.  She denied having any fever or chills.  She has no nausea, vomiting, diarrhea or constipation.  She has no headache or visual changes.  The patient is here today for evaluation before starting cycle #15 of her treatment.  MEDICAL HISTORY: Past Medical History:  Diagnosis Date  . Allergy   . Cancer ([place])    lung cancer  . GERD (gastroesophageal reflux disease)   . Hyperlipidemia   . Hypertension   . Reflux   . Tubulovillous adenoma of colon 2003    ALLERGIES:  is allergic to coconut oil; codeine; shrimp [shellfish allergy]; and trandolapril.  MEDICATIONS:  Current Outpatient Medications  Medication Sig Dispense Refill  . albuterol (PROAIR HFA) 108 (90 Base) MCG/ACT inhaler Inhale 2 puffs into the lungs every 6 (six) hours as needed for wheezing or shortness of breath. 1 Inhaler 3  . amLODipine (NORVASC) 10 MG tablet TAKE 1 TABLET(10 MG) BY MOUTH DAILY 90 tablet 3   . aspirin EC 81 MG tablet Take 81 mg by mouth daily.    Marland Kitchen atorvastatin (LIPITOR) 40 MG tablet TAKE 1 TABLET BY MOUTH DAILY 90 tablet 3  . HYDROcodone-acetaminophen (HYCET) 7.5-325 mg/15 ml solution Take 15 mLs by mouth 4 (four) times daily as needed. (Patient not taking: Reported on 01/05/2019) 473 mL 0  . omeprazole (PRILOSEC) 40 MG capsule TAKE 1 CAPSULE(40 MG) BY MOUTH DAILY 90 capsule 3  . potassium chloride (K-DUR) 10 MEQ tablet Take 1 tablet (10 mEq total) by mouth 2 (two) times daily. 60 tablet 3  . prochlorperazine (COMPAZINE) 10 MG tablet Take 1 tablet (10 mg total) by mouth every 6 (six) hours as needed for nausea or vomiting. (Patient not taking: Reported on 01/19/2019) 30 tablet 0  . sucralfate (CARAFATE) 1 g tablet TAKE 1 TABLET BY MOUTH 1 HOUR BEFORE MEALS AND AT BEDTIME AS NEEDED (Patient taking differently: Take 1 g by mouth 4 (four) times daily as needed (stomach acid). ) 120 tablet prn   No current facility-administered medications for this visit.     SURGICAL HISTORY:  Past Surgical History:  Procedure Laterality Date  . CESAREAN SECTION     1 time  . COLONOSCOPY    . VIDEO BRONCHOSCOPY WITH ENDOBRONCHIAL NAVIGATION N/A 06/24/2018   Procedure: VIDEO BRONCHOSCOPY WITH ENDOBRONCHIAL NAVIGATION with biopsies, right upper lung lobe;  Surgeon: Melrose Nakayama, MD;  Location: [facility] OR;  Service: Thoracic;  Laterality: N/A;  . VIDEO BRONCHOSCOPY WITH ENDOBRONCHIAL ULTRASOUND N/A 06/24/2018  Procedure: VIDEO BRONCHOSCOPY WITH ENDOBRONCHIAL ULTRASOUND, right upper lung lobe;  Surgeon: Melrose Nakayama, MD;  Location: [facility] OR;  Service: Thoracic;  Laterality: N/A;    REVIEW OF SYSTEMS:  A comprehensive review of systems was negative.   PHYSICAL EXAMINATION: General appearance: alert, cooperative and no distress Head: Normocephalic, without obvious abnormality, atraumatic Neck: no adenopathy, no JVD, supple, symmetrical, trachea midline and thyroid not enlarged, symmetric, no  tenderness/mass/nodules Lymph nodes: Cervical, supraclavicular, and axillary nodes normal. Resp: clear to auscultation bilaterally Back: symmetric, no curvature. ROM normal. No CVA tenderness. Cardio: regular rate and rhythm, S1, S2 normal, no murmur, click, rub or gallop GI: soft, non-tender; bowel sounds normal; no masses,  no organomegaly Extremities: extremities normal, atraumatic, no cyanosis or edema  ECOG PERFORMANCE STATUS: 0 - Asymptomatic  Blood pressure (!) 132/93, pulse 94, temperature 98.7 F (37.1 C), temperature source Temporal, resp. rate 18, height 5\' 7"  (1.702 m), weight 175 lb 11.2 oz (79.7 kg), SpO2 100 %.  LABORATORY DATA: Lab Results  Component Value Date   WBC 4.9 04/27/2019   HGB 12.6 04/27/2019   HCT 39.2 04/27/2019   MCV 89.1 04/27/2019   PLT 326 04/27/2019      Chemistry      Component Value Date/Time   NA 142 04/13/2019 0845   NA 144 04/02/2017 1435   K 3.1 (L) 04/13/2019 0845   CL 106 04/13/2019 0845   CO2 26 04/13/2019 0845   BUN 13 04/13/2019 0845   BUN 11 04/02/2017 1435   CREATININE 0.94 04/13/2019 0845   CREATININE 0.87 03/22/2016 0908      Component Value Date/Time   CALCIUM 9.1 04/13/2019 0845   ALKPHOS 105 04/13/2019 0845   AST 14 (L) 04/13/2019 0845   ALT 14 04/13/2019 0845   BILITOT 0.5 04/13/2019 0845       RADIOGRAPHIC STUDIES: No results found.  ASSESSMENT AND PLAN: This is a very pleasant 73 years old African-American female recently diagnosed with a stage IIIB (T4, N2, M0) non-small cell lung cancer, poorly differentiated squamous cell carcinoma presented with right upper lobe lung mass in addition to right hilar and mediastinal lymphadenopathy. The patient underwent concurrent chemoradiation with weekly carboplatin and paclitaxel status post 8 cycles. She has partial response to this treatment. The patient was started on consolidation treatment with immunotherapy with Imfinzi 10 mg/KG every 2 weeks status post 14 cycles.    She continues to tolerate her treatment well with no concerning adverse effects. I recommended for the patient to proceed with cycle #15 today as planned. She will come back for follow-up visit in 2 weeks for evaluation before the next cycle of her treatment. The patient was advised to call immediately if she has any concerning symptoms in the interval. The patient voices understanding of current disease status and treatment options and is in agreement with the current care plan.  All questions were answered. The patient knows to call the clinic with any problems, questions or concerns. We can certainly see the patient much sooner if necessary.  Disclaimer: This note was dictated with voice recognition software. Similar sounding words can inadvertently be transcribed and may not be corrected upon review.

## 2019-05-11 ENCOUNTER — Inpatient Hospital Stay: Payer: BC Managed Care – PPO

## 2019-05-11 ENCOUNTER — Encounter: Payer: Self-pay | Admitting: Internal Medicine

## 2019-05-11 ENCOUNTER — Inpatient Hospital Stay: Payer: BC Managed Care – PPO | Admitting: Internal Medicine

## 2019-05-11 ENCOUNTER — Other Ambulatory Visit: Payer: Self-pay

## 2019-05-11 VITALS — BP 137/93 | HR 95 | Temp 98.2°F | Resp 18 | Ht 67.0 in | Wt 176.0 lb

## 2019-05-11 DIAGNOSIS — K219 Gastro-esophageal reflux disease without esophagitis: Secondary | ICD-10-CM | POA: Diagnosis not present

## 2019-05-11 DIAGNOSIS — C3491 Malignant neoplasm of unspecified part of right bronchus or lung: Secondary | ICD-10-CM

## 2019-05-11 DIAGNOSIS — E785 Hyperlipidemia, unspecified: Secondary | ICD-10-CM | POA: Diagnosis not present

## 2019-05-11 DIAGNOSIS — Z7982 Long term (current) use of aspirin: Secondary | ICD-10-CM | POA: Diagnosis not present

## 2019-05-11 DIAGNOSIS — Z5112 Encounter for antineoplastic immunotherapy: Secondary | ICD-10-CM | POA: Diagnosis not present

## 2019-05-11 DIAGNOSIS — Z79899 Other long term (current) drug therapy: Secondary | ICD-10-CM | POA: Diagnosis not present

## 2019-05-11 DIAGNOSIS — R590 Localized enlarged lymph nodes: Secondary | ICD-10-CM | POA: Diagnosis not present

## 2019-05-11 DIAGNOSIS — Z87891 Personal history of nicotine dependence: Secondary | ICD-10-CM | POA: Diagnosis not present

## 2019-05-11 DIAGNOSIS — I10 Essential (primary) hypertension: Secondary | ICD-10-CM | POA: Diagnosis not present

## 2019-05-11 DIAGNOSIS — C3411 Malignant neoplasm of upper lobe, right bronchus or lung: Secondary | ICD-10-CM | POA: Diagnosis not present

## 2019-05-11 DIAGNOSIS — Z923 Personal history of irradiation: Secondary | ICD-10-CM | POA: Diagnosis not present

## 2019-05-11 LAB — CMP (CANCER CENTER ONLY)
ALT: 13 U/L (ref 0–44)
AST: 15 U/L (ref 15–41)
Albumin: 3.5 g/dL (ref 3.5–5.0)
Alkaline Phosphatase: 101 U/L (ref 38–126)
Anion gap: 10 (ref 5–15)
BUN: 18 mg/dL (ref 8–23)
CO2: 26 mmol/L (ref 22–32)
Calcium: 9.3 mg/dL (ref 8.9–10.3)
Chloride: 108 mmol/L (ref 98–111)
Creatinine: 0.96 mg/dL (ref 0.44–1.00)
GFR, Est AFR Am: >60 mL/min (ref >60)
GFR, Estimated: 60 mL/min — ABNORMAL LOW (ref >60)
Glucose, Bld: 108 mg/dL — ABNORMAL HIGH (ref 70–99)
Potassium: 4.1 mmol/L (ref 3.5–5.1)
Sodium: 144 mmol/L (ref 135–145)
Total Bilirubin: 0.3 mg/dL (ref 0.3–1.2)
Total Protein: 6.9 g/dL (ref 6.5–8.1)

## 2019-05-11 LAB — CBC WITH DIFFERENTIAL (CANCER CENTER ONLY)
Abs Immature Granulocytes: 0.02 10*3/uL (ref 0.00–0.07)
Basophils Absolute: 0.1 10*3/uL (ref 0.0–0.1)
Basophils Relative: 1 %
Eosinophils Absolute: 0.1 10*3/uL (ref 0.0–0.5)
Eosinophils Relative: 3 %
HCT: 38.1 % (ref 36.0–46.0)
Hemoglobin: 12.5 g/dL (ref 12.0–15.0)
Immature Granulocytes: 0 %
Lymphocytes Relative: 17 %
Lymphs Abs: 0.9 10*3/uL (ref 0.7–4.0)
MCH: 29.8 pg (ref 26.0–34.0)
MCHC: 32.8 g/dL (ref 30.0–36.0)
MCV: 90.7 fL (ref 80.0–100.0)
Monocytes Absolute: 0.5 10*3/uL (ref 0.1–1.0)
Monocytes Relative: 8 %
Neutro Abs: 3.9 10*3/uL (ref 1.7–7.7)
Neutrophils Relative %: 71 %
Platelet Count: 314 10*3/uL (ref 150–400)
RBC: 4.2 MIL/uL (ref 3.87–5.11)
RDW: 15.8 % — ABNORMAL HIGH (ref 11.5–15.5)
WBC Count: 5.5 10*3/uL (ref 4.0–10.5)
nRBC: 0 % (ref 0.0–0.2)

## 2019-05-11 MED ORDER — SODIUM CHLORIDE 0.9 % IV SOLN
9.6000 mg/kg | Freq: Once | INTRAVENOUS | Status: AC
  Administered 2019-05-11: 740 mg via INTRAVENOUS
  Filled 2019-05-11: qty 4.8

## 2019-05-11 MED ORDER — SODIUM CHLORIDE 0.9 % IV SOLN
Freq: Once | INTRAVENOUS | Status: AC
  Administered 2019-05-11: 09:00:00 via INTRAVENOUS
  Filled 2019-05-11: qty 250

## 2019-05-11 NOTE — Progress Notes (Signed)
[place] [phone]   [phone]  OFFICE PROGRESS NOTE  [name], MD [address] - [place] [address]  DIAGNOSIS: Stage IIIB (T4, N2, M0) non-small cell lung cancer, poorly differentiated squamous cell carcinoma.  She presented with large right upper lobe lung mass in addition to right hilar and mediastinal lymphadenopathy diagnosed in January 2020.  PRIOR THERAPY: Concurrent chemoradiation with weekly carboplatin for AUC of 2 and paclitaxel 45 mg/M2.  First dose July 13, 2018.  Status post 8 cycles.  Last dose was given August 31, 2018.  CURRENT THERAPY: Consolidation treatment with immunotherapy with Imfinzi 10 mg/KG every 2 weeks.  First dose starts October 13, 2018.  Status post 15 cycles.  INTERVAL HISTORY: Christina [name] 73 y.o. female returns to the clinic today for follow-up visit.  The patient is feeling fine today with no concerning complaints.  She denied having any current chest pain, shortness of breath, cough or hemoptysis.  She denied having any fever or chills.  She has no nausea, vomiting, diarrhea or constipation.  She denied having any rash.  She has been tolerating her treatment with Imfinzi fairly well.  She is here for evaluation before starting cycle #16.   MEDICAL HISTORY: Past Medical History:  Diagnosis Date  . Allergy   . Cancer ([place])    lung cancer  . GERD (gastroesophageal reflux disease)   . Hyperlipidemia   . Hypertension   . Reflux   . Tubulovillous adenoma of colon 2003    ALLERGIES:  is allergic to coconut oil; codeine; shrimp [shellfish allergy]; and trandolapril.  MEDICATIONS:  Current Outpatient Medications  Medication Sig Dispense Refill  . albuterol (PROAIR HFA) 108 (90 Base) MCG/ACT inhaler Inhale 2 puffs into the lungs every 6 (six) hours as needed for wheezing or shortness of breath. 1 Inhaler 3  . amLODipine (NORVASC) 10 MG tablet TAKE 1 TABLET(10 MG) BY  MOUTH DAILY 90 tablet 3  . aspirin EC 81 MG tablet Take 81 mg by mouth daily.    Marland Kitchen atorvastatin (LIPITOR) 40 MG tablet TAKE 1 TABLET BY MOUTH DAILY 90 tablet 3  . HYDROcodone-acetaminophen (HYCET) 7.5-325 mg/15 ml solution Take 15 mLs by mouth 4 (four) times daily as needed. (Patient not taking: Reported on 01/05/2019) 473 mL 0  . omeprazole (PRILOSEC) 40 MG capsule TAKE 1 CAPSULE(40 MG) BY MOUTH DAILY 90 capsule 3  . potassium chloride (K-DUR) 10 MEQ tablet Take 1 tablet (10 mEq total) by mouth 2 (two) times daily. 60 tablet 3  . prochlorperazine (COMPAZINE) 10 MG tablet Take 1 tablet (10 mg total) by mouth every 6 (six) hours as needed for nausea or vomiting. (Patient not taking: Reported on 01/19/2019) 30 tablet 0  . sucralfate (CARAFATE) 1 g tablet TAKE 1 TABLET BY MOUTH 1 HOUR BEFORE MEALS AND AT BEDTIME AS NEEDED (Patient taking differently: Take 1 g by mouth 4 (four) times daily as needed (stomach acid). ) 120 tablet prn   No current facility-administered medications for this visit.     SURGICAL HISTORY:  Past Surgical History:  Procedure Laterality Date  . CESAREAN SECTION     1 time  . COLONOSCOPY    . VIDEO BRONCHOSCOPY WITH ENDOBRONCHIAL NAVIGATION N/A 06/24/2018   Procedure: VIDEO BRONCHOSCOPY WITH ENDOBRONCHIAL NAVIGATION with biopsies, right upper lung lobe;  Surgeon: Melrose Nakayama, MD;  Location: [facility] OR;  Service: Thoracic;  Laterality: N/A;  . VIDEO BRONCHOSCOPY WITH  ENDOBRONCHIAL ULTRASOUND N/A 06/24/2018   Procedure: VIDEO BRONCHOSCOPY WITH ENDOBRONCHIAL ULTRASOUND, right upper lung lobe;  Surgeon: Melrose Nakayama, MD;  Location: [facility] OR;  Service: Thoracic;  Laterality: N/A;    REVIEW OF SYSTEMS:  A comprehensive review of systems was negative.   PHYSICAL EXAMINATION: General appearance: alert, cooperative and no distress Head: Normocephalic, without obvious abnormality, atraumatic Neck: no adenopathy, no JVD, supple, symmetrical, trachea midline and thyroid not  enlarged, symmetric, no tenderness/mass/nodules Lymph nodes: Cervical, supraclavicular, and axillary nodes normal. Resp: clear to auscultation bilaterally Back: symmetric, no curvature. ROM normal. No CVA tenderness. Cardio: regular rate and rhythm, S1, S2 normal, no murmur, click, rub or gallop GI: soft, non-tender; bowel sounds normal; no masses,  no organomegaly Extremities: extremities normal, atraumatic, no cyanosis or edema  ECOG PERFORMANCE STATUS: 0 - Asymptomatic  Blood pressure (!) 137/93, pulse 95, temperature 98.2 F (36.8 C), temperature source Temporal, resp. rate 18, height 5\' 7"  (1.702 m), weight 176 lb (79.8 kg), SpO2 99 %.  LABORATORY DATA: Lab Results  Component Value Date   WBC 5.5 05/11/2019   HGB 12.5 05/11/2019   HCT 38.1 05/11/2019   MCV 90.7 05/11/2019   PLT 314 05/11/2019      Chemistry      Component Value Date/Time   NA 142 04/27/2019 1028   NA 144 04/02/2017 1435   K 3.4 (L) 04/27/2019 1028   CL 106 04/27/2019 1028   CO2 26 04/27/2019 1028   BUN 12 04/27/2019 1028   BUN 11 04/02/2017 1435   CREATININE 0.99 04/27/2019 1028   CREATININE 0.87 03/22/2016 0908      Component Value Date/Time   CALCIUM 9.3 04/27/2019 1028   ALKPHOS 102 04/27/2019 1028   AST 14 (L) 04/27/2019 1028   ALT 14 04/27/2019 1028   BILITOT 0.4 04/27/2019 1028       RADIOGRAPHIC STUDIES: No results found.  ASSESSMENT AND PLAN: This is a very pleasant 73 years old African-American female recently diagnosed with a stage IIIB (T4, N2, M0) non-small cell lung cancer, poorly differentiated squamous cell carcinoma presented with right upper lobe lung mass in addition to right hilar and mediastinal lymphadenopathy. The patient underwent concurrent chemoradiation with weekly carboplatin and paclitaxel status post 8 cycles. She has partial response to this treatment. The patient was started on consolidation treatment with immunotherapy with Imfinzi 10 mg/KG every 2 weeks status  post 15 cycles.   The patient has been tolerating this treatment well with no concerning complaints. I recommended for her to proceed with cycle #16 today as planned. I will see her back for follow-up visit in 2 weeks for evaluation before cycle #17. She was advised to call immediately if she has any concerning symptoms in the interval. The patient voices understanding of current disease status and treatment options and is in agreement with the current care plan.  All questions were answered. The patient knows to call the clinic with any problems, questions or concerns. We can certainly see the patient much sooner if necessary.  Disclaimer: This note was dictated with voice recognition software. Similar sounding words can inadvertently be transcribed and may not be corrected upon review.

## 2019-05-11 NOTE — Patient Instructions (Signed)
[facility] Discharge Instructions for Patients Receiving Chemotherapy  Today you received the following chemotherapy agents: Imfinzi.  To help prevent nausea and vomiting after your treatment, we encourage you to take your nausea medication as directed.   If you develop nausea and vomiting that is not controlled by your nausea medication, call the clinic.   BELOW ARE SYMPTOMS THAT SHOULD BE REPORTED IMMEDIATELY:  *FEVER GREATER THAN 100.5 F  *CHILLS WITH OR WITHOUT FEVER  NAUSEA AND VOMITING THAT IS NOT CONTROLLED WITH YOUR NAUSEA MEDICATION  *UNUSUAL SHORTNESS OF BREATH  *UNUSUAL BRUISING OR BLEEDING  TENDERNESS IN MOUTH AND THROAT WITH OR WITHOUT PRESENCE OF ULCERS  *URINARY PROBLEMS  *BOWEL PROBLEMS  UNUSUAL RASH Items with * indicate a potential emergency and should be followed up as soon as possible.  Feel free to call the clinic should you have any questions or concerns. The clinic phone number is [phone].  Please show the CHEMO ALERT CARD at check-in to the Emergency Department and triage nurse.   

## 2019-05-12 ENCOUNTER — Telehealth: Payer: Self-pay | Admitting: Internal Medicine

## 2019-05-12 NOTE — Telephone Encounter (Signed)
Scheduled per los. Will get printout at next appt

## 2019-05-19 ENCOUNTER — Ambulatory Visit: Payer: BC Managed Care – PPO | Admitting: Family Medicine

## 2019-05-19 ENCOUNTER — Encounter: Payer: Self-pay | Admitting: Family Medicine

## 2019-05-19 ENCOUNTER — Other Ambulatory Visit: Payer: Self-pay

## 2019-05-19 VITALS — BP 137/84 | HR 107 | Temp 97.3°F | Resp 17 | Ht 67.0 in | Wt 175.4 lb

## 2019-05-19 DIAGNOSIS — Z1231 Encounter for screening mammogram for malignant neoplasm of breast: Secondary | ICD-10-CM | POA: Diagnosis not present

## 2019-05-19 DIAGNOSIS — I10 Essential (primary) hypertension: Secondary | ICD-10-CM | POA: Diagnosis not present

## 2019-05-19 DIAGNOSIS — C3491 Malignant neoplasm of unspecified part of right bronchus or lung: Secondary | ICD-10-CM

## 2019-05-19 DIAGNOSIS — I700 Atherosclerosis of aorta: Secondary | ICD-10-CM | POA: Diagnosis not present

## 2019-05-19 DIAGNOSIS — Z1211 Encounter for screening for malignant neoplasm of colon: Secondary | ICD-10-CM | POA: Diagnosis not present

## 2019-05-19 NOTE — Patient Instructions (Addendum)
  1. Call to schedule your mammogram We recommend that you schedule a mammogram for breast cancer screening. Typically, you do not need a referral to do this. Please contact a local imaging center to schedule your mammogram.  [facility] ([place]) - [phone] or [phone]   2. Call to schedule your colonoscopy    If you have lab work done today you will be contacted with your lab results within the next 2 weeks.  If you have not heard from [place] then please contact us. The fastest way to get your results is to register for My Chart.   IF you received an x-ray today, you will receive an invoice from [facility]. Please contact [facility] at [phone] with questions or concerns regarding your invoice.   IF you received labwork today, you will receive an invoice from [facility]. Please contact [facility] at [phone] with questions or concerns regarding your invoice.   Our billing staff will not be able to assist you with questions regarding bills from these companies.  You will be contacted with the lab results as soon as they are available. The fastest way to get your results is to activate your My Chart account. Instructions are located on the last page of this paperwork. If you have not heard from [place] regarding the results in 2 weeks, please contact this office.

## 2019-05-19 NOTE — Progress Notes (Signed)
Established Patient Office Visit  Subjective:  Patient ID: Christina [name], female    DOB: 1946-01-22  Age: 73 y.o. MRN: [id number]  CC:  Chief Complaint  Patient presents with  . Hypertension    HPI Christina [name] presents for    Hypertension: Patient here for follow-up of elevated blood pressure. She is not exercising and is adherent to low salt diet.  Blood pressure is well controlled at home. Cardiac symptoms none. Patient denies chest pain, chest pressure/discomfort, dyspnea, exertional chest pressure/discomfort and fatigue.  Cardiovascular risk factors: hypertension and sedentary lifestyle. Use of agents associated with hypertension: none. History of target organ damage: none. BP Readings from Last 3 Encounters:  05/19/19 137/84  05/11/19 (!) 137/93  04/27/19 (!) 132/93   Lung Cancer - Stage III Right Pt reports that she feels wells She reports that she has been feeling pretty good. She reports that she was offered flu vaccine and she declines. She does not want it.  She gets infusions. She has 7 more schedule.  Colonoscopy due- pt not up to date  Atherosclerosis Pt reports that she is taking the lipitor She is on asa She has quit smoking since her cancer diagnosis No chest pains, no calf pain or cramping of the buttocks   Past Medical History:  Diagnosis Date  . Allergy   . Cancer ([place])    lung cancer  . GERD (gastroesophageal reflux disease)   . Hyperlipidemia   . Hypertension   . Reflux   . Tubulovillous adenoma of colon 2003    Past Surgical History:  Procedure Laterality Date  . CESAREAN SECTION     1 time  . COLONOSCOPY    . VIDEO BRONCHOSCOPY WITH ENDOBRONCHIAL NAVIGATION N/A 06/24/2018   Procedure: VIDEO BRONCHOSCOPY WITH ENDOBRONCHIAL NAVIGATION with biopsies, right upper lung lobe;  Surgeon: Melrose Nakayama, MD;  Location: [facility] OR;  Service: Thoracic;  Laterality: N/A;  . VIDEO BRONCHOSCOPY WITH ENDOBRONCHIAL ULTRASOUND N/A 06/24/2018   Procedure: VIDEO BRONCHOSCOPY WITH ENDOBRONCHIAL ULTRASOUND, right upper lung lobe;  Surgeon: Melrose Nakayama, MD;  Location: [facility] OR;  Service: Thoracic;  Laterality: N/A;    Family History  Problem Relation Age of Onset  . Hypertension Mother   . Heart disease Mother   . Leukemia Father   . Cancer Father   . Hypertension Sister   . Hypertension Sister     Social History   Socioeconomic History  . Marital status: Married    Spouse name: Christina [name]  . Number of children: 1  . Years of education: 12th grade  . Highest education level: Not on file  Occupational History  . Occupation: school housekeeping    Comment: part-time  Social Needs  . Financial resource strain: Not on file  . Food insecurity    Worry: Not on file    Inability: Not on file  . Transportation needs    Medical: Not on file    Non-medical: Not on file  Tobacco Use  . Smoking status: Former Smoker    Packs/day: 0.50    Years: 50.00    Pack years: 25.00    Types: Cigarettes  . Smokeless tobacco: Never Used  . Tobacco comment: Previously quit in 2015 but has started smoking again   Substance and Sexual Activity  . Alcohol use: No    Alcohol/week: 0.0 standard drinks  . Drug use: No  . Sexual activity: Never  Lifestyle  . Physical activity    Days per week: Not  on file    Minutes per session: Not on file  . Stress: Not on file  Relationships  . Social Herbalist on phone: Not on file    Gets together: Not on file    Attends religious service: Not on file    Active member of club or organization: Not on file    Attends meetings of clubs or organizations: Not on file    Relationship status: Not on file  . Intimate partner violence    Fear of current or ex partner: Not on file    Emotionally abused: Not on file    Physically abused: Not on file    Forced sexual activity: Not on file  Other Topics Concern  . Not on file  Social History Narrative   Lives with her husband. Her  daughter also lives in [place] with her husband and 3 children.    Outpatient Medications Prior to Visit  Medication Sig Dispense Refill  . albuterol (PROAIR HFA) 108 (90 Base) MCG/ACT inhaler Inhale 2 puffs into the lungs every 6 (six) hours as needed for wheezing or shortness of breath. 1 Inhaler 3  . amLODipine (NORVASC) 10 MG tablet TAKE 1 TABLET(10 MG) BY MOUTH DAILY 90 tablet 3  . aspirin EC 81 MG tablet Take 81 mg by mouth daily.    Marland Kitchen atorvastatin (LIPITOR) 40 MG tablet TAKE 1 TABLET BY MOUTH DAILY 90 tablet 3  . omeprazole (PRILOSEC) 40 MG capsule TAKE 1 CAPSULE(40 MG) BY MOUTH DAILY 90 capsule 3  . potassium chloride (K-DUR) 10 MEQ tablet Take 1 tablet (10 mEq total) by mouth 2 (two) times daily. 60 tablet 3  . sucralfate (CARAFATE) 1 g tablet TAKE 1 TABLET BY MOUTH 1 HOUR BEFORE MEALS AND AT BEDTIME AS NEEDED (Patient taking differently: Take 1 g by mouth 4 (four) times daily as needed (stomach acid). ) 120 tablet prn  . HYDROcodone-acetaminophen (HYCET) 7.5-325 mg/15 ml solution Take 15 mLs by mouth 4 (four) times daily as needed. (Patient not taking: Reported on 01/05/2019) 473 mL 0  . prochlorperazine (COMPAZINE) 10 MG tablet Take 1 tablet (10 mg total) by mouth every 6 (six) hours as needed for nausea or vomiting. (Patient not taking: Reported on 01/19/2019) 30 tablet 0   No facility-administered medications prior to visit.     Allergies  Allergen Reactions  . Coconut Oil Hives  . Codeine Other (See Comments)    Upsets stomach really bad  . Shrimp [Shellfish Allergy] Hives  . Trandolapril Swelling    Swelling of the face/lips 07/2014    ROS Review of Systems Review of Systems  Constitutional: Negative for activity change, appetite change, chills and fever.  HENT: Negative for congestion, nosebleeds, trouble swallowing and voice change.   Respiratory: Negative for cough, shortness of breath and wheezing.   Gastrointestinal: Negative for diarrhea, nausea and vomiting.   Genitourinary: Negative for difficulty urinating, dysuria, flank pain and hematuria.  Musculoskeletal: Negative for back pain, joint swelling and neck pain.  Neurological: Negative for dizziness, speech difficulty, light-headedness and numbness.  See HPI. All other review of systems negative.     Objective:    Physical Exam  BP 137/84 (BP Location: Right Arm, Patient Position: Sitting, Cuff Size: Large)   Pulse (!) 107   Temp (!) 97.3 F (36.3 C) (Oral)   Resp 17   Ht 5\' 7"  (1.702 m)   Wt 175 lb 6.4 oz (79.6 kg)   SpO2 97%   BMI  27.47 kg/m  Wt Readings from Last 3 Encounters:  05/19/19 175 lb 6.4 oz (79.6 kg)  05/11/19 176 lb (79.8 kg)  04/27/19 175 lb 11.2 oz (79.7 kg)   Physical Exam  Constitutional: Oriented to person, place, and time. Appears well-developed and well-nourished.  HENT:  Head: Normocephalic and atraumatic.  Eyes: Conjunctivae and EOM are normal.  Neck: supple, no thyromegaly Cardiovascular: Normal rate, regular rhythm, normal heart sounds and intact distal pulses.  No murmur heard. Pulmonary/Chest: Effort normal and breath sounds normal. No stridor. No respiratory distress. Has no wheezes.  Neurological: Is alert and oriented to person, place, and time.  Skin: Skin is warm. Capillary refill takes less than 2 seconds.  Psychiatric: Has a normal mood and affect. Behavior is normal. Judgment and thought content normal.    Health Maintenance Due  Topic Date Due  . MAMMOGRAM  07/11/2018  . INFLUENZA VACCINE  01/16/2019  . COLONOSCOPY  04/15/2019    There are no preventive care reminders to display for this patient.  Lab Results  Component Value Date   TSH 1.377 04/27/2019   Lab Results  Component Value Date   WBC 5.5 05/11/2019   HGB 12.5 05/11/2019   HCT 38.1 05/11/2019   MCV 90.7 05/11/2019   PLT 314 05/11/2019   Lab Results  Component Value Date   NA 144 05/11/2019   K 4.1 05/11/2019   CO2 26 05/11/2019   GLUCOSE 108 (H) 05/11/2019    BUN 18 05/11/2019   CREATININE 0.96 05/11/2019   BILITOT 0.3 05/11/2019   ALKPHOS 101 05/11/2019   AST 15 05/11/2019   ALT 13 05/11/2019   PROT 6.9 05/11/2019   ALBUMIN 3.5 05/11/2019   CALCIUM 9.3 05/11/2019   ANIONGAP 10 05/11/2019   Lab Results  Component Value Date   CHOL 154 04/02/2017   Lab Results  Component Value Date   HDL 56 04/02/2017   Lab Results  Component Value Date   LDLCALC 83 04/02/2017   Lab Results  Component Value Date   TRIG 76 04/02/2017   Lab Results  Component Value Date   CHOLHDL 2.8 04/02/2017   No results found for: HGBA1C    Assessment & Plan:   Problem List Items Addressed This Visit      Cardiovascular and Mediastinum   Essential hypertension - Primary Patient's blood pressure is at goal of 139/89 or less. Condition is stable. Continue current medications and treatment plan. I recommend that you exercise for 30-45 minutes 5 days a week. I also recommend a balanced diet with fruits and vegetables every day, lean meats, and little fried foods. The DASH diet (you can find this online) is a good example of this.      Respiratory   Stage III squamous cell carcinoma of right lung ([place])- continue with Oncology    Other Visit Diagnoses    Aortic atherosclerosis ([place])    - discussed statin and aspirin   Special screening for malignant neoplasms, colon       Encounter for screening mammogram for malignant neoplasm of breast          No orders of the defined types were placed in this encounter.   Follow-up: Return in about 6 months (around 11/17/2019) for physical exam.    [name], MD

## 2019-05-25 ENCOUNTER — Inpatient Hospital Stay: Payer: BC Managed Care – PPO | Admitting: Physician Assistant

## 2019-05-25 ENCOUNTER — Other Ambulatory Visit: Payer: Self-pay

## 2019-05-25 ENCOUNTER — Inpatient Hospital Stay: Payer: BC Managed Care – PPO

## 2019-05-25 ENCOUNTER — Inpatient Hospital Stay: Payer: BC Managed Care – PPO | Attending: Internal Medicine

## 2019-05-25 VITALS — BP 130/95 | HR 86 | Temp 98.0°F | Resp 20 | Ht 67.0 in | Wt 175.8 lb

## 2019-05-25 DIAGNOSIS — Z7982 Long term (current) use of aspirin: Secondary | ICD-10-CM | POA: Diagnosis not present

## 2019-05-25 DIAGNOSIS — Z5112 Encounter for antineoplastic immunotherapy: Secondary | ICD-10-CM | POA: Insufficient documentation

## 2019-05-25 DIAGNOSIS — Z923 Personal history of irradiation: Secondary | ICD-10-CM | POA: Diagnosis not present

## 2019-05-25 DIAGNOSIS — R591 Generalized enlarged lymph nodes: Secondary | ICD-10-CM | POA: Diagnosis not present

## 2019-05-25 DIAGNOSIS — C3491 Malignant neoplasm of unspecified part of right bronchus or lung: Secondary | ICD-10-CM | POA: Diagnosis not present

## 2019-05-25 DIAGNOSIS — Z79899 Other long term (current) drug therapy: Secondary | ICD-10-CM | POA: Insufficient documentation

## 2019-05-25 DIAGNOSIS — I10 Essential (primary) hypertension: Secondary | ICD-10-CM | POA: Diagnosis not present

## 2019-05-25 DIAGNOSIS — Z9221 Personal history of antineoplastic chemotherapy: Secondary | ICD-10-CM | POA: Insufficient documentation

## 2019-05-25 DIAGNOSIS — K219 Gastro-esophageal reflux disease without esophagitis: Secondary | ICD-10-CM | POA: Diagnosis not present

## 2019-05-25 DIAGNOSIS — E785 Hyperlipidemia, unspecified: Secondary | ICD-10-CM | POA: Insufficient documentation

## 2019-05-25 DIAGNOSIS — Z87891 Personal history of nicotine dependence: Secondary | ICD-10-CM | POA: Diagnosis not present

## 2019-05-25 DIAGNOSIS — C3411 Malignant neoplasm of upper lobe, right bronchus or lung: Secondary | ICD-10-CM | POA: Diagnosis not present

## 2019-05-25 LAB — CBC WITH DIFFERENTIAL (CANCER CENTER ONLY)
Abs Immature Granulocytes: 0.02 10*3/uL (ref 0.00–0.07)
Basophils Absolute: 0.1 10*3/uL (ref 0.0–0.1)
Basophils Relative: 1 %
Eosinophils Absolute: 0.2 10*3/uL (ref 0.0–0.5)
Eosinophils Relative: 3 %
HCT: 39.9 % (ref 36.0–46.0)
Hemoglobin: 13.1 g/dL (ref 12.0–15.0)
Immature Granulocytes: 0 %
Lymphocytes Relative: 16 %
Lymphs Abs: 0.8 10*3/uL (ref 0.7–4.0)
MCH: 29.2 pg (ref 26.0–34.0)
MCHC: 32.8 g/dL (ref 30.0–36.0)
MCV: 88.9 fL (ref 80.0–100.0)
Monocytes Absolute: 0.5 10*3/uL (ref 0.1–1.0)
Monocytes Relative: 9 %
Neutro Abs: 3.5 10*3/uL (ref 1.7–7.7)
Neutrophils Relative %: 71 %
Platelet Count: 361 10*3/uL (ref 150–400)
RBC: 4.49 MIL/uL (ref 3.87–5.11)
RDW: 15.1 % (ref 11.5–15.5)
WBC Count: 4.9 10*3/uL (ref 4.0–10.5)
nRBC: 0 % (ref 0.0–0.2)

## 2019-05-25 LAB — CMP (CANCER CENTER ONLY)
ALT: 13 U/L (ref 0–44)
AST: 16 U/L (ref 15–41)
Albumin: 3.8 g/dL (ref 3.5–5.0)
Alkaline Phosphatase: 104 U/L (ref 38–126)
Anion gap: 10 (ref 5–15)
BUN: 16 mg/dL (ref 8–23)
CO2: 26 mmol/L (ref 22–32)
Calcium: 9.2 mg/dL (ref 8.9–10.3)
Chloride: 106 mmol/L (ref 98–111)
Creatinine: 0.92 mg/dL (ref 0.44–1.00)
GFR, Est AFR Am: >60 mL/min (ref >60)
GFR, Estimated: >60 mL/min (ref >60)
Glucose, Bld: 86 mg/dL (ref 70–99)
Potassium: 3.8 mmol/L (ref 3.5–5.1)
Sodium: 142 mmol/L (ref 135–145)
Total Bilirubin: 0.4 mg/dL (ref 0.3–1.2)
Total Protein: 7.4 g/dL (ref 6.5–8.1)

## 2019-05-25 LAB — TSH: TSH: 1.322 u[IU]/mL (ref 0.308–3.960)

## 2019-05-25 MED ORDER — SODIUM CHLORIDE 0.9 % IV SOLN
Freq: Once | INTRAVENOUS | Status: AC
  Administered 2019-05-25: 10:00:00 via INTRAVENOUS
  Filled 2019-05-25: qty 250

## 2019-05-25 MED ORDER — SODIUM CHLORIDE 0.9 % IV SOLN
9.6000 mg/kg | Freq: Once | INTRAVENOUS | Status: AC
  Administered 2019-05-25: 10:00:00 740 mg via INTRAVENOUS
  Filled 2019-05-25: qty 4.8

## 2019-05-25 NOTE — Patient Instructions (Signed)
[facility] Discharge Instructions for Patients Receiving Chemotherapy  Today you received the following chemotherapy agents: Imfinzi.  To help prevent nausea and vomiting after your treatment, we encourage you to take your nausea medication as directed.   If you develop nausea and vomiting that is not controlled by your nausea medication, call the clinic.   BELOW ARE SYMPTOMS THAT SHOULD BE REPORTED IMMEDIATELY:  *FEVER GREATER THAN 100.5 F  *CHILLS WITH OR WITHOUT FEVER  NAUSEA AND VOMITING THAT IS NOT CONTROLLED WITH YOUR NAUSEA MEDICATION  *UNUSUAL SHORTNESS OF BREATH  *UNUSUAL BRUISING OR BLEEDING  TENDERNESS IN MOUTH AND THROAT WITH OR WITHOUT PRESENCE OF ULCERS  *URINARY PROBLEMS  *BOWEL PROBLEMS  UNUSUAL RASH Items with * indicate a potential emergency and should be followed up as soon as possible.  Feel free to call the clinic should you have any questions or concerns. The clinic phone number is [phone].  Please show the CHEMO ALERT CARD at check-in to the Emergency Department and triage nurse.   

## 2019-05-25 NOTE — Progress Notes (Signed)
[facility] OFFICE PROGRESS NOTE  [name], MD [address] [facility] [place] [address]  DIAGNOSIS: Stage IIIB(T4, N2, M0) non-small cell lung cancer, poorly differentiated squamous cell carcinoma. She presented with large right upper lobe lung mass in addition to right hilar and mediastinal lymphadenopathy diagnosed in January 2020.  PRIOR THERAPY: Concurrent chemoradiation with weekly carboplatin for AUC of 2 and paclitaxel 45 mg/M2.  First dose July 13, 2018.  Status post 8 cycles.  Last dose was given August 31, 2018.  CURRENT THERAPY: Consolidation treatment with immunotherapy with Imfinzi 10 mg/KG every 2 weeks.  First dose starts October 13, 2018.  Status post 16 cycles.  INTERVAL HISTORY: Christina [name] 73 y.o. female returns to the clinic for a follow up visit. The patient is feeling well today without any concerning complaints. The patient continues to tolerate treatment with immunotherapy with imfinzi well without any adverse side effects. Denies any fever, chills, night sweats, or weight loss. Denies any chest pain, shortness of breath, cough, or hemoptysis. Denies any nausea, vomiting, diarrhea, or constipation. Denies any headache or visual changes. Denies any rashes or skin changes. The patient is here today for evaluation prior to starting cycle #17    MEDICAL HISTORY: Past Medical History:  Diagnosis Date  . Allergy   . Cancer ([place])    lung cancer  . GERD (gastroesophageal reflux disease)   . Hyperlipidemia   . Hypertension   . Reflux   . Tubulovillous adenoma of colon 2003    ALLERGIES:  is allergic to coconut oil; codeine; shrimp [shellfish allergy]; and trandolapril.  MEDICATIONS:  Current Outpatient Medications  Medication Sig Dispense Refill  . albuterol (PROAIR HFA) 108 (90 Base) MCG/ACT inhaler Inhale 2 puffs into the lungs every 6 (six) hours as needed for wheezing or shortness of breath. 1 Inhaler 3  .  amLODipine (NORVASC) 10 MG tablet TAKE 1 TABLET(10 MG) BY MOUTH DAILY 90 tablet 3  . aspirin EC 81 MG tablet Take 81 mg by mouth daily.    Marland Kitchen atorvastatin (LIPITOR) 40 MG tablet TAKE 1 TABLET BY MOUTH DAILY 90 tablet 3  . HYDROcodone-acetaminophen (HYCET) 7.5-325 mg/15 ml solution Take 15 mLs by mouth 4 (four) times daily as needed. (Patient not taking: Reported on 01/05/2019) 473 mL 0  . omeprazole (PRILOSEC) 40 MG capsule TAKE 1 CAPSULE(40 MG) BY MOUTH DAILY 90 capsule 3  . potassium chloride (K-DUR) 10 MEQ tablet Take 1 tablet (10 mEq total) by mouth 2 (two) times daily. 60 tablet 3  . prochlorperazine (COMPAZINE) 10 MG tablet Take 1 tablet (10 mg total) by mouth every 6 (six) hours as needed for nausea or vomiting. (Patient not taking: Reported on 01/19/2019) 30 tablet 0  . sucralfate (CARAFATE) 1 g tablet TAKE 1 TABLET BY MOUTH 1 HOUR BEFORE MEALS AND AT BEDTIME AS NEEDED (Patient taking differently: Take 1 g by mouth 4 (four) times daily as needed (stomach acid). ) 120 tablet prn   No current facility-administered medications for this visit.     SURGICAL HISTORY:  Past Surgical History:  Procedure Laterality Date  . CESAREAN SECTION     1 time  . COLONOSCOPY    . VIDEO BRONCHOSCOPY WITH ENDOBRONCHIAL NAVIGATION N/A 06/24/2018   Procedure: VIDEO BRONCHOSCOPY WITH ENDOBRONCHIAL NAVIGATION with biopsies, right upper lung lobe;  Surgeon: Melrose Nakayama, MD;  Location: [facility] OR;  Service: Thoracic;  Laterality: N/A;  . VIDEO BRONCHOSCOPY WITH ENDOBRONCHIAL ULTRASOUND N/A 06/24/2018  Procedure: VIDEO BRONCHOSCOPY WITH ENDOBRONCHIAL ULTRASOUND, right upper lung lobe;  Surgeon: Melrose Nakayama, MD;  Location: [facility] OR;  Service: Thoracic;  Laterality: N/A;    REVIEW OF SYSTEMS:   Review of Systems  Constitutional: Negative for appetite change, chills, fatigue, fever and unexpected weight change.  HENT: Negative for mouth sores, nosebleeds, sore throat and trouble swallowing.   Eyes:  Negative for eye problems and icterus.  Respiratory: Negative for cough, hemoptysis, shortness of breath and wheezing.   Cardiovascular: Negative for chest pain and leg swelling.  Gastrointestinal: Negative for abdominal pain, constipation, diarrhea, nausea and vomiting.  Genitourinary: Negative for bladder incontinence, difficulty urinating, dysuria, frequency and hematuria.   Musculoskeletal: Negative for back pain, gait problem, neck pain and neck stiffness.  Skin: Negative for itching and rash.  Neurological: Negative for dizziness, extremity weakness, gait problem, headaches, light-headedness and seizures.  Hematological: Negative for adenopathy. Does not bruise/bleed easily.  Psychiatric/Behavioral: Negative for confusion, depression and sleep disturbance. The patient is not nervous/anxious.    PHYSICAL EXAMINATION:  There were no vitals taken for this visit.  ECOG PERFORMANCE STATUS: 0 - Asymptomatic  Physical Exam  Constitutional: Oriented to person, place, and time and well-developed, well-nourished, and in no distress.  HENT:  Head: Normocephalic and atraumatic.  Mouth/Throat: Oropharynx is clear and moist. No oropharyngeal exudate.  Eyes: Conjunctivae are normal. Right eye exhibits no discharge. Left eye exhibits no discharge. No scleral icterus.  Neck: Normal range of motion. Neck supple.  Cardiovascular: Normal rate, regular rhythm, normal heart sounds and intact distal pulses.   Pulmonary/Chest: Effort normal and breath sounds normal. No respiratory distress. No wheezes. No rales.  Abdominal: Soft. Bowel sounds are normal. Exhibits no distension and no mass. There is no tenderness.  Musculoskeletal: Normal range of motion. Exhibits no edema.  Lymphadenopathy:    No cervical adenopathy.  Neurological: Alert and oriented to person, place, and time. Exhibits normal muscle tone. Gait normal. Coordination normal.  Skin: Skin is warm and dry. No rash noted. Not diaphoretic. No  erythema. No pallor.  Psychiatric: Mood, memory and judgment normal.  Vitals reviewed.  LABORATORY DATA: Lab Results  Component Value Date   WBC 5.5 05/11/2019   HGB 12.5 05/11/2019   HCT 38.1 05/11/2019   MCV 90.7 05/11/2019   PLT 314 05/11/2019      Chemistry      Component Value Date/Time   NA 144 05/11/2019 0758   NA 144 04/02/2017 1435   K 4.1 05/11/2019 0758   CL 108 05/11/2019 0758   CO2 26 05/11/2019 0758   BUN 18 05/11/2019 0758   BUN 11 04/02/2017 1435   CREATININE 0.96 05/11/2019 0758   CREATININE 0.87 03/22/2016 0908      Component Value Date/Time   CALCIUM 9.3 05/11/2019 0758   ALKPHOS 101 05/11/2019 0758   AST 15 05/11/2019 0758   ALT 13 05/11/2019 0758   BILITOT 0.3 05/11/2019 0758       RADIOGRAPHIC STUDIES:  No results found.   ASSESSMENT/PLAN:  This is a very pleasant 73 year old African-American female diagnosed with stage IIIb(T4, N2, M0) non-small cell lung cancer, poorly differentiated squamous cell carcinoma. She presented with a right upper lung mass in addition to right hilar and mediastinal lymphadenopathy. She was diagnosed in January 2020.  The patient previously received concurrent chemoradiation with weekly carboplatin for an AUC of 2 and paclitaxel 45 mg/m. She is status post 8 cycles.  The patient is currently undergoing consolidation immunotherapy with Imfinzi 10  mg/kg IV every 2 weeks. She is status post16cycles of treatment. She has been tolerating it well without any adverse side effects.  Labs were reviewed. I recommend that she proceed with cycle #14 today as scheduled.   We will see her back for a follow up visit in 2 weeks for evaluation before starting cycle #18  The patient was advised to call immediately if she has any concerning symptoms in the interval. The patient voices understanding of current disease status and treatment options and is in agreement with the current care plan. All questions were  answered. The patient knows to call the clinic with any problems, questions or concerns. We can certainly see the patient much sooner if necessary  No orders of the defined types were placed in this encounter.    Cassandra L Heilingoetter, PA-C 05/25/19

## 2019-05-26 ENCOUNTER — Encounter: Payer: Self-pay | Admitting: *Deleted

## 2019-05-28 ENCOUNTER — Ambulatory Visit: Payer: Self-pay | Admitting: Family Medicine

## 2019-06-07 NOTE — Progress Notes (Signed)
[facility] OFFICE PROGRESS NOTE  Christina Moron, MD [address] [facility] [place] [address]  DIAGNOSIS: Stage IIIB(T4, N2, M0) non-small cell lung cancer, poorly differentiated squamous cell carcinoma. She presented with large right upper lobe lung mass in addition to right hilar and mediastinal lymphadenopathy diagnosed in January 2020.  PRIOR THERAPY: Concurrent chemoradiation with weekly carboplatin for AUC of 2 and paclitaxel 45 mg/M2. First dose July 13, 2018. Status post 8 cycles. Last dose was given August 31, 2018.  CURRENT THERAPY: Consolidation treatment with immunotherapy with Imfinzi 10 mg/KG every 2 weeks. First dose starts October 13, 2018. Status post 17 cycles  INTERVAL HISTORY: Christina [name] 73 y.o. female returns to the clinic for a follow up visit. The patient is feeling well today without any concerning complaints except for occasional heart burn. She takes omeprazole daily for this concern. Regarding her treatment, the patient continues to tolerate immunotherapy with imfinzi well without any adverse side effects. Denies any fever, chills, night sweats, or weight loss. Denies any chest pain, shortness of breath, cough, or hemoptysis. Denies any nausea, vomiting, diarrhea, or constipation. Denies any headache or visual changes. Denies any rashes or skin changes. The patient is here today for evaluation prior to starting cycle #18    MEDICAL HISTORY: Past Medical History:  Diagnosis Date  . Allergy   . Cancer ([place])    lung cancer  . GERD (gastroesophageal reflux disease)   . Hyperlipidemia   . Hypertension   . Reflux   . Tubulovillous adenoma of colon 2003    ALLERGIES:  is allergic to coconut oil; codeine; shrimp [shellfish allergy]; and trandolapril.  MEDICATIONS:  Current Outpatient Medications  Medication Sig Dispense Refill  . albuterol (PROAIR HFA) 108 (90 Base) MCG/ACT inhaler Inhale 2 puffs into the lungs  every 6 (six) hours as needed for wheezing or shortness of breath. 1 Inhaler 3  . amLODipine (NORVASC) 10 MG tablet TAKE 1 TABLET(10 MG) BY MOUTH DAILY 90 tablet 3  . aspirin EC 81 MG tablet Take 81 mg by mouth daily.    Marland Kitchen atorvastatin (LIPITOR) 40 MG tablet TAKE 1 TABLET BY MOUTH DAILY 90 tablet 3  . HYDROcodone-acetaminophen (HYCET) 7.5-325 mg/15 ml solution Take 15 mLs by mouth 4 (four) times daily as needed. (Patient not taking: Reported on 01/05/2019) 473 mL 0  . omeprazole (PRILOSEC) 40 MG capsule TAKE 1 CAPSULE(40 MG) BY MOUTH DAILY 90 capsule 3  . potassium chloride (K-DUR) 10 MEQ tablet Take 1 tablet (10 mEq total) by mouth 2 (two) times daily. 60 tablet 3  . prochlorperazine (COMPAZINE) 10 MG tablet Take 1 tablet (10 mg total) by mouth every 6 (six) hours as needed for nausea or vomiting. (Patient not taking: Reported on 01/19/2019) 30 tablet 0  . sucralfate (CARAFATE) 1 g tablet TAKE 1 TABLET BY MOUTH 1 HOUR BEFORE MEALS AND AT BEDTIME AS NEEDED (Patient taking differently: Take 1 g by mouth 4 (four) times daily as needed (stomach acid). ) 120 tablet prn   No current facility-administered medications for this visit.    SURGICAL HISTORY:  Past Surgical History:  Procedure Laterality Date  . CESAREAN SECTION     1 time  . COLONOSCOPY    . VIDEO BRONCHOSCOPY WITH ENDOBRONCHIAL NAVIGATION N/A 06/24/2018   Procedure: VIDEO BRONCHOSCOPY WITH ENDOBRONCHIAL NAVIGATION with biopsies, right upper lung lobe;  Surgeon: Melrose Nakayama, MD;  Location: [facility];  Service: Thoracic;  Laterality: N/A;  .  VIDEO BRONCHOSCOPY WITH ENDOBRONCHIAL ULTRASOUND N/A 06/24/2018   Procedure: VIDEO BRONCHOSCOPY WITH ENDOBRONCHIAL ULTRASOUND, right upper lung lobe;  Surgeon: Melrose Nakayama, MD;  Location: [facility] OR;  Service: Thoracic;  Laterality: N/A;    REVIEW OF SYSTEMS:   Review of Systems  Constitutional: Negative for appetite change, chills, fatigue, fever and unexpected weight change.  HENT: Positive  for reflux. Negative for mouth sores, nosebleeds, sore throat and trouble swallowing.   Eyes: Negative for eye problems and icterus.  Respiratory: Negative for cough, hemoptysis, shortness of breath and wheezing.   Cardiovascular: Negative for chest pain and leg swelling.  Gastrointestinal: Negative for abdominal pain, constipation, diarrhea, nausea and vomiting.  Genitourinary: Negative for bladder incontinence, difficulty urinating, dysuria, frequency and hematuria.   Musculoskeletal: Negative for back pain, gait problem, neck pain and neck stiffness.  Skin: Negative for itching and rash.  Neurological: Negative for dizziness, extremity weakness, gait problem, headaches, light-headedness and seizures.  Hematological: Negative for adenopathy. Does not bruise/bleed easily.  Psychiatric/Behavioral: Negative for confusion, depression and sleep disturbance. The patient is not nervous/anxious.     PHYSICAL EXAMINATION:  Blood pressure (!) 146/89, pulse 90, temperature 98.2 F (36.8 C), temperature source Temporal, resp. rate 17, height 5\' 7"  (1.702 m), weight 177 lb 9.6 oz (80.6 kg), SpO2 97 %.  ECOG PERFORMANCE STATUS: 1 - Symptomatic but completely ambulatory  Physical Exam  Constitutional: Oriented to person, place, and time and well-developed, well-nourished, and in no distress.  HENT:  Head: Normocephalic and atraumatic.  Mouth/Throat: Oropharynx is clear and moist. No oropharyngeal exudate.  Eyes: Conjunctivae are normal. Right eye exhibits no discharge. Left eye exhibits no discharge. No scleral icterus.  Neck: Normal range of motion. Neck supple.  Cardiovascular: Normal rate, regular rhythm, normal heart sounds and intact distal pulses.   Pulmonary/Chest: Effort normal and breath sounds normal. No respiratory distress. No wheezes. No rales.  Abdominal: Soft. Bowel sounds are normal. Exhibits no distension and no mass. There is no tenderness.  Musculoskeletal: Normal range of motion.  Exhibits no edema.  Lymphadenopathy:    No cervical adenopathy.  Neurological: Alert and oriented to person, place, and time. Exhibits normal muscle tone. Gait normal. Coordination normal.  Skin: Skin is warm and dry. No rash noted. Not diaphoretic. No erythema. No pallor.  Psychiatric: Mood, memory and judgment normal.  Vitals reviewed.  LABORATORY DATA: Lab Results  Component Value Date   WBC 5.4 06/08/2019   HGB 13.4 06/08/2019   HCT 41.3 06/08/2019   MCV 89.0 06/08/2019   PLT 335 06/08/2019      Chemistry      Component Value Date/Time   NA 141 06/08/2019 1121   NA 144 04/02/2017 1435   K 3.8 06/08/2019 1121   CL 106 06/08/2019 1121   CO2 25 06/08/2019 1121   BUN 12 06/08/2019 1121   BUN 11 04/02/2017 1435   CREATININE 0.86 06/08/2019 1121   CREATININE 0.87 03/22/2016 0908      Component Value Date/Time   CALCIUM 9.1 06/08/2019 1121   ALKPHOS 113 06/08/2019 1121   AST 16 06/08/2019 1121   ALT 13 06/08/2019 1121   BILITOT 0.4 06/08/2019 1121       RADIOGRAPHIC STUDIES:  No results found.   ASSESSMENT/PLAN:  This is a very pleasant 73 year old African-American female diagnosed with stage IIIb(T4, N2, M0) non-small cell lung cancer, poorly differentiated squamous cell carcinoma. She presented with a right upper lung mass in addition to right hilar and mediastinal lymphadenopathy. She was  diagnosed in January 2020.  The patient previously received concurrent chemoradiation with weekly carboplatin for an AUC of 2 and paclitaxel 45 mg/m. She is status post 8 cycles.  The patient is currently undergoing consolidation immunotherapy with Imfinzi 10 mg/kg IV every 2 weeks. She is status post17cycles of treatment. She has been tolerating it well without any adverse side effects.  Labs were reviewed. I recommend that she proceed with cycle #18 today as scheduled.   I will arrange for the patient to have a restaging CT scan before her next cycle of treatment.    We will see her back for a follow up visit in 2 weeks for evaluation and to review her scan results before starting cycle #19  The patient will continue to take omeprazole for her reflux. I also provided a fact sheet with information on lifestyle modifications for reflux.   The patient was advised to call immediately if she has any concerning symptoms in the interval. The patient voices understanding of current disease status and treatment options and is in agreement with the current care plan. All questions were answered. The patient knows to call the clinic with any problems, questions or concerns. We can certainly see the patient much sooner if necessary  Orders Placed This Encounter  Procedures  . CT Abdomen Pelvis W Contrast    Standing Status:   Future    Standing Expiration Date:   06/07/2020    Order Specific Question:   ** REASON FOR EXAM (FREE TEXT)    Answer:   Restaging Lung Cancer    Order Specific Question:   If indicated for the ordered procedure, I authorize the administration of contrast media per Radiology protocol    Answer:   Yes    Order Specific Question:   Preferred imaging location?    Answer:   [facility]    Order Specific Question:   Is Oral Contrast requested for this exam?    Answer:   Yes, Per Radiology protocol    Order Specific Question:   Radiology Contrast Protocol - do NOT remove file path    Answer:   \\[email or web address]  . CT Chest W Contrast    Standing Status:   Future    Standing Expiration Date:   06/07/2020    Order Specific Question:   ** REASON FOR EXAM (FREE TEXT)    Answer:   Restaging Lung Cancer    Order Specific Question:   If indicated for the ordered procedure, I authorize the administration of contrast media per Radiology protocol    Answer:   Yes    Order Specific Question:   Preferred imaging location?    Answer:   [facility]    Order Specific Question:   Radiology Contrast Protocol -  do NOT remove file path    Answer:   \\[email or web address]     [place], PA-C 06/08/19

## 2019-06-08 ENCOUNTER — Inpatient Hospital Stay: Payer: BC Managed Care – PPO | Admitting: Physician Assistant

## 2019-06-08 ENCOUNTER — Inpatient Hospital Stay: Payer: BC Managed Care – PPO

## 2019-06-08 ENCOUNTER — Other Ambulatory Visit: Payer: Self-pay

## 2019-06-08 ENCOUNTER — Other Ambulatory Visit: Payer: Self-pay | Admitting: Physician Assistant

## 2019-06-08 VITALS — BP 146/89 | HR 90 | Temp 98.2°F | Resp 17 | Ht 67.0 in | Wt 177.6 lb

## 2019-06-08 DIAGNOSIS — C3491 Malignant neoplasm of unspecified part of right bronchus or lung: Secondary | ICD-10-CM

## 2019-06-08 DIAGNOSIS — I10 Essential (primary) hypertension: Secondary | ICD-10-CM | POA: Diagnosis not present

## 2019-06-08 DIAGNOSIS — Z5112 Encounter for antineoplastic immunotherapy: Secondary | ICD-10-CM

## 2019-06-08 DIAGNOSIS — R591 Generalized enlarged lymph nodes: Secondary | ICD-10-CM | POA: Diagnosis not present

## 2019-06-08 DIAGNOSIS — E785 Hyperlipidemia, unspecified: Secondary | ICD-10-CM | POA: Diagnosis not present

## 2019-06-08 DIAGNOSIS — Z87891 Personal history of nicotine dependence: Secondary | ICD-10-CM | POA: Diagnosis not present

## 2019-06-08 DIAGNOSIS — Z923 Personal history of irradiation: Secondary | ICD-10-CM | POA: Diagnosis not present

## 2019-06-08 DIAGNOSIS — K219 Gastro-esophageal reflux disease without esophagitis: Secondary | ICD-10-CM | POA: Diagnosis not present

## 2019-06-08 DIAGNOSIS — Z79899 Other long term (current) drug therapy: Secondary | ICD-10-CM | POA: Diagnosis not present

## 2019-06-08 DIAGNOSIS — Z7982 Long term (current) use of aspirin: Secondary | ICD-10-CM | POA: Diagnosis not present

## 2019-06-08 DIAGNOSIS — Z9221 Personal history of antineoplastic chemotherapy: Secondary | ICD-10-CM | POA: Diagnosis not present

## 2019-06-08 DIAGNOSIS — C3411 Malignant neoplasm of upper lobe, right bronchus or lung: Secondary | ICD-10-CM | POA: Diagnosis not present

## 2019-06-08 LAB — CBC WITH DIFFERENTIAL (CANCER CENTER ONLY)
Abs Immature Granulocytes: 0.02 10*3/uL (ref 0.00–0.07)
Basophils Absolute: 0.1 10*3/uL (ref 0.0–0.1)
Basophils Relative: 1 %
Eosinophils Absolute: 0.1 10*3/uL (ref 0.0–0.5)
Eosinophils Relative: 2 %
HCT: 41.3 % (ref 36.0–46.0)
Hemoglobin: 13.4 g/dL (ref 12.0–15.0)
Immature Granulocytes: 0 %
Lymphocytes Relative: 21 %
Lymphs Abs: 1.1 10*3/uL (ref 0.7–4.0)
MCH: 28.9 pg (ref 26.0–34.0)
MCHC: 32.4 g/dL (ref 30.0–36.0)
MCV: 89 fL (ref 80.0–100.0)
Monocytes Absolute: 0.5 10*3/uL (ref 0.1–1.0)
Monocytes Relative: 9 %
Neutro Abs: 3.6 10*3/uL (ref 1.7–7.7)
Neutrophils Relative %: 67 %
Platelet Count: 335 10*3/uL (ref 150–400)
RBC: 4.64 MIL/uL (ref 3.87–5.11)
RDW: 15.1 % (ref 11.5–15.5)
WBC Count: 5.4 10*3/uL (ref 4.0–10.5)
nRBC: 0 % (ref 0.0–0.2)

## 2019-06-08 LAB — CMP (CANCER CENTER ONLY)
ALT: 13 U/L (ref 0–44)
AST: 16 U/L (ref 15–41)
Albumin: 3.7 g/dL (ref 3.5–5.0)
Alkaline Phosphatase: 113 U/L (ref 38–126)
Anion gap: 10 (ref 5–15)
BUN: 12 mg/dL (ref 8–23)
CO2: 25 mmol/L (ref 22–32)
Calcium: 9.1 mg/dL (ref 8.9–10.3)
Chloride: 106 mmol/L (ref 98–111)
Creatinine: 0.86 mg/dL (ref 0.44–1.00)
GFR, Est AFR Am: >60 mL/min (ref >60)
GFR, Estimated: >60 mL/min (ref >60)
Glucose, Bld: 94 mg/dL (ref 70–99)
Potassium: 3.8 mmol/L (ref 3.5–5.1)
Sodium: 141 mmol/L (ref 135–145)
Total Bilirubin: 0.4 mg/dL (ref 0.3–1.2)
Total Protein: 7.4 g/dL (ref 6.5–8.1)

## 2019-06-08 MED ORDER — SODIUM CHLORIDE 0.9 % IV SOLN
9.6000 mg/kg | Freq: Once | INTRAVENOUS | Status: AC
  Administered 2019-06-08: 740 mg via INTRAVENOUS
  Filled 2019-06-08: qty 10

## 2019-06-08 MED ORDER — SODIUM CHLORIDE 0.9 % IV SOLN
Freq: Once | INTRAVENOUS | Status: AC
  Filled 2019-06-08: qty 250

## 2019-06-08 NOTE — Patient Instructions (Signed)
[facility] Discharge Instructions for Patients Receiving Chemotherapy  Today you received the following chemotherapy agents: Imfinzi.  To help prevent nausea and vomiting after your treatment, we encourage you to take your nausea medication as directed.   If you develop nausea and vomiting that is not controlled by your nausea medication, call the clinic.   BELOW ARE SYMPTOMS THAT SHOULD BE REPORTED IMMEDIATELY:  *FEVER GREATER THAN 100.5 F  *CHILLS WITH OR WITHOUT FEVER  NAUSEA AND VOMITING THAT IS NOT CONTROLLED WITH YOUR NAUSEA MEDICATION  *UNUSUAL SHORTNESS OF BREATH  *UNUSUAL BRUISING OR BLEEDING  TENDERNESS IN MOUTH AND THROAT WITH OR WITHOUT PRESENCE OF ULCERS  *URINARY PROBLEMS  *BOWEL PROBLEMS  UNUSUAL RASH Items with * indicate a potential emergency and should be followed up as soon as possible.  Feel free to call the clinic should you have any questions or concerns. The clinic phone number is [phone].  Please show the CHEMO ALERT CARD at check-in to the Emergency Department and triage nurse.   

## 2019-06-08 NOTE — Patient Instructions (Signed)

## 2019-06-21 ENCOUNTER — Ambulatory Visit (HOSPITAL_COMMUNITY)
Admission: RE | Admit: 2019-06-21 | Discharge: 2019-06-21 | Disposition: A | Discharge status: Home / Self Care | Payer: BC Managed Care – PPO | Source: Ambulatory Visit | Attending: Physician Assistant | Admitting: Physician Assistant

## 2019-06-21 ENCOUNTER — Other Ambulatory Visit: Payer: Self-pay

## 2019-06-21 DIAGNOSIS — C3491 Malignant neoplasm of unspecified part of right bronchus or lung: Secondary | ICD-10-CM | POA: Diagnosis not present

## 2019-06-21 MED ORDER — SODIUM CHLORIDE (PF) 0.9 % IJ SOLN
INTRAMUSCULAR | Status: AC
  Filled 2019-06-21: qty 50

## 2019-06-21 MED ORDER — IOHEXOL 300 MG/ML  SOLN
75.0000 mL | Freq: Once | INTRAMUSCULAR | Status: AC | PRN
  Administered 2019-06-21: 75 mL via INTRAVENOUS

## 2019-06-22 ENCOUNTER — Encounter: Payer: Self-pay | Admitting: Internal Medicine

## 2019-06-22 ENCOUNTER — Inpatient Hospital Stay: Payer: BC Managed Care – PPO

## 2019-06-22 ENCOUNTER — Inpatient Hospital Stay: Payer: BC Managed Care – PPO | Attending: Internal Medicine | Admitting: Internal Medicine

## 2019-06-22 ENCOUNTER — Other Ambulatory Visit: Payer: Self-pay

## 2019-06-22 VITALS — BP 117/85 | HR 113 | Temp 97.2°F | Resp 18 | Ht 67.0 in | Wt 176.9 lb

## 2019-06-22 VITALS — HR 94

## 2019-06-22 DIAGNOSIS — C3491 Malignant neoplasm of unspecified part of right bronchus or lung: Secondary | ICD-10-CM

## 2019-06-22 DIAGNOSIS — K219 Gastro-esophageal reflux disease without esophagitis: Secondary | ICD-10-CM | POA: Insufficient documentation

## 2019-06-22 DIAGNOSIS — Z87891 Personal history of nicotine dependence: Secondary | ICD-10-CM | POA: Diagnosis not present

## 2019-06-22 DIAGNOSIS — Z7982 Long term (current) use of aspirin: Secondary | ICD-10-CM | POA: Insufficient documentation

## 2019-06-22 DIAGNOSIS — I10 Essential (primary) hypertension: Secondary | ICD-10-CM | POA: Insufficient documentation

## 2019-06-22 DIAGNOSIS — Z9221 Personal history of antineoplastic chemotherapy: Secondary | ICD-10-CM | POA: Insufficient documentation

## 2019-06-22 DIAGNOSIS — C3411 Malignant neoplasm of upper lobe, right bronchus or lung: Secondary | ICD-10-CM | POA: Diagnosis not present

## 2019-06-22 DIAGNOSIS — Z923 Personal history of irradiation: Secondary | ICD-10-CM | POA: Insufficient documentation

## 2019-06-22 DIAGNOSIS — Z79899 Other long term (current) drug therapy: Secondary | ICD-10-CM | POA: Insufficient documentation

## 2019-06-22 DIAGNOSIS — Z5112 Encounter for antineoplastic immunotherapy: Secondary | ICD-10-CM | POA: Diagnosis not present

## 2019-06-22 DIAGNOSIS — R591 Generalized enlarged lymph nodes: Secondary | ICD-10-CM | POA: Insufficient documentation

## 2019-06-22 DIAGNOSIS — E785 Hyperlipidemia, unspecified: Secondary | ICD-10-CM | POA: Diagnosis not present

## 2019-06-22 LAB — CMP (CANCER CENTER ONLY)
ALT: 15 U/L (ref 0–44)
AST: 19 U/L (ref 15–41)
Albumin: 3.6 g/dL (ref 3.5–5.0)
Alkaline Phosphatase: 111 U/L (ref 38–126)
Anion gap: 10 (ref 5–15)
BUN: 12 mg/dL (ref 8–23)
CO2: 25 mmol/L (ref 22–32)
Calcium: 9.1 mg/dL (ref 8.9–10.3)
Chloride: 106 mmol/L (ref 98–111)
Creatinine: 0.96 mg/dL (ref 0.44–1.00)
GFR, Est AFR Am: >60 mL/min (ref >60)
GFR, Estimated: 60 mL/min — ABNORMAL LOW (ref >60)
Glucose, Bld: 106 mg/dL — ABNORMAL HIGH (ref 70–99)
Potassium: 3.4 mmol/L — ABNORMAL LOW (ref 3.5–5.1)
Sodium: 141 mmol/L (ref 135–145)
Total Bilirubin: 0.5 mg/dL (ref 0.3–1.2)
Total Protein: 7.3 g/dL (ref 6.5–8.1)

## 2019-06-22 LAB — CBC WITH DIFFERENTIAL (CANCER CENTER ONLY)
Abs Immature Granulocytes: 0.01 10*3/uL (ref 0.00–0.07)
Basophils Absolute: 0 10*3/uL (ref 0.0–0.1)
Basophils Relative: 1 %
Eosinophils Absolute: 0.1 10*3/uL (ref 0.0–0.5)
Eosinophils Relative: 1 %
HCT: 39.7 % (ref 36.0–46.0)
Hemoglobin: 13 g/dL (ref 12.0–15.0)
Immature Granulocytes: 0 %
Lymphocytes Relative: 18 %
Lymphs Abs: 0.9 10*3/uL (ref 0.7–4.0)
MCH: 29.3 pg (ref 26.0–34.0)
MCHC: 32.7 g/dL (ref 30.0–36.0)
MCV: 89.4 fL (ref 80.0–100.0)
Monocytes Absolute: 0.5 10*3/uL (ref 0.1–1.0)
Monocytes Relative: 10 %
Neutro Abs: 3.7 10*3/uL (ref 1.7–7.7)
Neutrophils Relative %: 70 %
Platelet Count: 310 10*3/uL (ref 150–400)
RBC: 4.44 MIL/uL (ref 3.87–5.11)
RDW: 15.1 % (ref 11.5–15.5)
WBC Count: 5.2 10*3/uL (ref 4.0–10.5)
nRBC: 0 % (ref 0.0–0.2)

## 2019-06-22 LAB — TSH: TSH: 1.676 u[IU]/mL (ref 0.308–3.960)

## 2019-06-22 MED ORDER — SODIUM CHLORIDE 0.9 % IV SOLN
740.0000 mg | Freq: Once | INTRAVENOUS | Status: AC
  Administered 2019-06-22: 12:00:00 740 mg via INTRAVENOUS
  Filled 2019-06-22: qty 4.8

## 2019-06-22 MED ORDER — SODIUM CHLORIDE 0.9 % IV SOLN
Freq: Once | INTRAVENOUS | Status: AC
  Filled 2019-06-22: qty 250

## 2019-06-22 NOTE — Progress Notes (Signed)
[facility] [phone]   [phone]  OFFICE PROGRESS NOTE  [name], MD [address] - [place] [address]  DIAGNOSIS: Stage IIIB (T4, N2, M0) non-small cell lung cancer, poorly differentiated squamous cell carcinoma.  She presented with large right upper lobe lung mass in addition to right hilar and mediastinal lymphadenopathy diagnosed in January 2020.  PRIOR THERAPY: Concurrent chemoradiation with weekly carboplatin for AUC of 2 and paclitaxel 45 mg/M2.  First dose July 13, 2018.  Status post 8 cycles.  Last dose was given August 31, 2018.  CURRENT THERAPY: Consolidation treatment with immunotherapy with Imfinzi 10 mg/KG every 2 weeks.  First dose starts October 13, 2018.  Status post 18 cycles.  INTERVAL HISTORY: Christina [name] 74 y.o. female returns to the clinic today for follow-up visit.  The patient is feeling fine today with no concerning complaints.  She denied having any current chest pain, shortness of breath, cough or hemoptysis.  She denied having any fever or chills.  She has no nausea, vomiting, diarrhea or constipation.  She has no headache or visual changes.  The patient has no recent weight loss or night sweats.  She has been tolerating her treatment with Imfinzi fairly well.  She had repeat CT scan of the chest performed recently and she is here for evaluation and discussion of her scan results.  MEDICAL HISTORY: Past Medical History:  Diagnosis Date  . Allergy   . Cancer ([place])    lung cancer  . GERD (gastroesophageal reflux disease)   . Hyperlipidemia   . Hypertension   . Reflux   . Tubulovillous adenoma of colon 2003    ALLERGIES:  is allergic to coconut oil; codeine; shrimp [shellfish allergy]; and trandolapril.  MEDICATIONS:  Current Outpatient Medications  Medication Sig Dispense Refill  . albuterol (PROAIR HFA) 108 (90 Base) MCG/ACT inhaler Inhale 2 puffs into the lungs every 6  (six) hours as needed for wheezing or shortness of breath. 1 Inhaler 3  . amLODipine (NORVASC) 10 MG tablet TAKE 1 TABLET(10 MG) BY MOUTH DAILY 90 tablet 3  . aspirin EC 81 MG tablet Take 81 mg by mouth daily.    Marland Kitchen atorvastatin (LIPITOR) 40 MG tablet TAKE 1 TABLET BY MOUTH DAILY 90 tablet 3  . HYDROcodone-acetaminophen (HYCET) 7.5-325 mg/15 ml solution Take 15 mLs by mouth 4 (four) times daily as needed. (Patient not taking: Reported on 01/05/2019) 473 mL 0  . omeprazole (PRILOSEC) 40 MG capsule TAKE 1 CAPSULE(40 MG) BY MOUTH DAILY 90 capsule 3  . potassium chloride (K-DUR) 10 MEQ tablet Take 1 tablet (10 mEq total) by mouth 2 (two) times daily. 60 tablet 3  . prochlorperazine (COMPAZINE) 10 MG tablet Take 1 tablet (10 mg total) by mouth every 6 (six) hours as needed for nausea or vomiting. (Patient not taking: Reported on 01/19/2019) 30 tablet 0  . sucralfate (CARAFATE) 1 g tablet TAKE 1 TABLET BY MOUTH 1 HOUR BEFORE MEALS AND AT BEDTIME AS NEEDED (Patient taking differently: Take 1 g by mouth 4 (four) times daily as needed (stomach acid). ) 120 tablet prn   No current facility-administered medications for this visit.    SURGICAL HISTORY:  Past Surgical History:  Procedure Laterality Date  . CESAREAN SECTION     1 time  . COLONOSCOPY    . VIDEO BRONCHOSCOPY WITH ENDOBRONCHIAL NAVIGATION N/A 06/24/2018   Procedure: VIDEO BRONCHOSCOPY WITH ENDOBRONCHIAL NAVIGATION with biopsies, right  upper lung lobe;  Surgeon: Melrose Nakayama, MD;  Location: [facility] OR;  Service: Thoracic;  Laterality: N/A;  . VIDEO BRONCHOSCOPY WITH ENDOBRONCHIAL ULTRASOUND N/A 06/24/2018   Procedure: VIDEO BRONCHOSCOPY WITH ENDOBRONCHIAL ULTRASOUND, right upper lung lobe;  Surgeon: Melrose Nakayama, MD;  Location: [facility] OR;  Service: Thoracic;  Laterality: N/A;    REVIEW OF SYSTEMS:  Constitutional: negative Eyes: negative Ears, nose, mouth, throat, and face: negative Respiratory: negative Cardiovascular:  negative Gastrointestinal: negative Genitourinary:negative Integument/breast: negative Hematologic/lymphatic: negative Musculoskeletal:negative Neurological: negative Behavioral/Psych: negative Endocrine: negative Allergic/Immunologic: negative   PHYSICAL EXAMINATION: General appearance: alert, cooperative and no distress Head: Normocephalic, without obvious abnormality, atraumatic Neck: no adenopathy, no JVD, supple, symmetrical, trachea midline and thyroid not enlarged, symmetric, no tenderness/mass/nodules Lymph nodes: Cervical, supraclavicular, and axillary nodes normal. Resp: clear to auscultation bilaterally Back: symmetric, no curvature. ROM normal. No CVA tenderness. Cardio: regular rate and rhythm, S1, S2 normal, no murmur, click, rub or gallop GI: soft, non-tender; bowel sounds normal; no masses,  no organomegaly Extremities: extremities normal, atraumatic, no cyanosis or edema Neurologic: Alert and oriented X 3, normal strength and tone. Normal symmetric reflexes. Normal coordination and gait  ECOG PERFORMANCE STATUS: 0 - Asymptomatic  Blood pressure 117/85, pulse (!) 113, temperature (!) 97.2 F (36.2 C), temperature source Temporal, resp. rate 18, height 5\' 7"  (1.702 m), weight 176 lb 14.4 oz (80.2 kg), SpO2 97 %.  LABORATORY DATA: Lab Results  Component Value Date   WBC 5.4 06/08/2019   HGB 13.4 06/08/2019   HCT 41.3 06/08/2019   MCV 89.0 06/08/2019   PLT 335 06/08/2019      Chemistry      Component Value Date/Time   NA 141 06/08/2019 1121   NA 144 04/02/2017 1435   K 3.8 06/08/2019 1121   CL 106 06/08/2019 1121   CO2 25 06/08/2019 1121   BUN 12 06/08/2019 1121   BUN 11 04/02/2017 1435   CREATININE 0.86 06/08/2019 1121   CREATININE 0.87 03/22/2016 0908      Component Value Date/Time   CALCIUM 9.1 06/08/2019 1121   ALKPHOS 113 06/08/2019 1121   AST 16 06/08/2019 1121   ALT 13 06/08/2019 1121   BILITOT 0.4 06/08/2019 1121       RADIOGRAPHIC  STUDIES: CT Chest W Contrast  Result Date: 06/21/2019 CLINICAL DATA:  Restaging right lung cancer. EXAM: CT CHEST WITH CONTRAST TECHNIQUE: Multidetector CT imaging of the chest was performed during intravenous contrast administration. CONTRAST:  42mL OMNIPAQUE IOHEXOL 300 MG/ML  SOLN COMPARISON:  03/11/2019 FINDINGS: Cardiovascular: The heart size appears within normal limits. Small pericardial effusion identified. Aortic atherosclerosis. RCA coronary artery atherosclerotic calcifications are identified. Mediastinum/Nodes: Large heterogeneous nodule in the left lobe of thyroid gland measures 3.1 cm. The trachea appears patent and is midline. Normal appearance of the esophagus. No adenopathy. Lungs/Pleura: Centrilobular emphysema. Scarring, volume loss, and masslike architectural distortion is identified within the anterior right upper lobe. The treated lung mass within the anterior right upper lobe measures 3.1 cm in maximum dimension, image 54/5. Previously 3.8 cm. Additional small pulmonary nodules within the superior segment of the right lower lobe are identified and appear similar to previous exam, nonspecific, image 73/5. No new or progressive disease. Upper Abdomen: No acute abnormality. 6 mm low-attenuation structure in right hepatic lobe is unchanged. Too small to characterize. Musculoskeletal: No chest wall abnormality. No acute or significant osseous findings. IMPRESSION: 1. Stable to slight decrease in size of treated right upper lobe lung mass. 2. No  new or progressive disease identified. 3. Emphysema and aortic atherosclerosis. RCA coronary artery calcifications noted. 4. Large heterogeneous nodule within the left lobe of thyroid gland. Recommend thyroid US (ref: J Am Coll Radiol. 2015 Feb;12(2): 143-50). Aortic Atherosclerosis (ICD10-I70.0) and Emphysema (ICD10-J43.9). Electronically Signed   By: Kerby Moors M.D.   On: 06/21/2019 16:40    ASSESSMENT AND PLAN: This is a very pleasant 74 years  old African-American female recently diagnosed with a stage IIIB (T4, N2, M0) non-small cell lung cancer, poorly differentiated squamous cell carcinoma presented with right upper lobe lung mass in addition to right hilar and mediastinal lymphadenopathy. The patient underwent concurrent chemoradiation with weekly carboplatin and paclitaxel status post 8 cycles. She has partial response to this treatment. The patient was started on consolidation treatment with immunotherapy with Imfinzi 10 mg/KG every 2 weeks status post 18 cycles.   The patient has been tolerating this treatment well with no concerning adverse effects. She had repeat CT scan of the chest performed recently.  I personally and independently reviewed the scans and discussed the results with the patient today. Her scan showed no concerning findings for disease progression. I recommended for her to continue her current treatment with consolidation Imfinzi every 2 weeks and the patient will proceed with cycle #19 today. She will come back for follow-up visit in 2 weeks for evaluation before the next cycle of her treatment. For hypertension she will continue on her current blood pressure medication and to monitor it closely at home. We will continue to monitor her thyroid function closely during her treatment with immunotherapy. The patient was advised to call immediately if she has any concerning symptoms in the interval. The patient voices understanding of current disease status and treatment options and is in agreement with the current care plan.  All questions were answered. The patient knows to call the clinic with any problems, questions or concerns. We can certainly see the patient much sooner if necessary.  Disclaimer: This note was dictated with voice recognition software. Similar sounding words can inadvertently be transcribed and may not be corrected upon review.

## 2019-06-22 NOTE — Patient Instructions (Signed)
[facility] Discharge Instructions for Patients Receiving Chemotherapy  Today you received the following chemotherapy agents: Imfinzi.  To help prevent nausea and vomiting after your treatment, we encourage you to take your nausea medication as directed.   If you develop nausea and vomiting that is not controlled by your nausea medication, call the clinic.   BELOW ARE SYMPTOMS THAT SHOULD BE REPORTED IMMEDIATELY:  *FEVER GREATER THAN 100.5 F  *CHILLS WITH OR WITHOUT FEVER  NAUSEA AND VOMITING THAT IS NOT CONTROLLED WITH YOUR NAUSEA MEDICATION  *UNUSUAL SHORTNESS OF BREATH  *UNUSUAL BRUISING OR BLEEDING  TENDERNESS IN MOUTH AND THROAT WITH OR WITHOUT PRESENCE OF ULCERS  *URINARY PROBLEMS  *BOWEL PROBLEMS  UNUSUAL RASH Items with * indicate a potential emergency and should be followed up as soon as possible.  Feel free to call the clinic should you have any questions or concerns. The clinic phone number is [phone].  Please show the CHEMO ALERT CARD at check-in to the Emergency Department and triage nurse.   

## 2019-06-24 ENCOUNTER — Telehealth: Payer: Self-pay | Admitting: Internal Medicine

## 2019-06-24 NOTE — Telephone Encounter (Signed)
Scheduled per los. Mailed printout  °

## 2019-06-25 ENCOUNTER — Other Ambulatory Visit: Payer: Self-pay | Admitting: Family Medicine

## 2019-06-25 ENCOUNTER — Other Ambulatory Visit: Payer: Self-pay

## 2019-06-25 DIAGNOSIS — K219 Gastro-esophageal reflux disease without esophagitis: Secondary | ICD-10-CM

## 2019-06-25 MED ORDER — SUCRALFATE 1 G PO TABS: ORAL_TABLET | ORAL | 99 refills | Status: DC

## 2019-06-25 NOTE — Telephone Encounter (Signed)
Please advise if the medication is still appropriate. Pt was last seen 05/19/2019 and was given a year ago.

## 2019-06-25 NOTE — Telephone Encounter (Signed)
Requested medication (s) are due for refill today:   Yes  Requested medication (s) are on the active medication list:   Yes but from 2019  Future visit scheduled:   No   Last ordered: 05/20/2018  #120  PRN by Dr. Nolon Rod.   Returned for provider for further disposition since from 2019.   Requested Prescriptions  Pending Prescriptions Disp Refills   sucralfate (CARAFATE) 1 g tablet [Pharmacy Med Name: SUCRALFATE 1GM TABLETS] 120 tablet prn    Sig: TAKE 1 TABLET BY MOUTH EVERY HOUR BEFORE MEALS AND AT BEDTIME AS NEEDED      Gastroenterology: Antiacids Passed - 06/25/2019 10:43 AM      Passed - Valid encounter within last 12 months    Recent Outpatient Visits           1 month ago Essential hypertension   Primary Care at [facility], Arlie Solomons, MD   7 months ago Essential hypertension   Primary Care at [facility], Arlie Solomons, MD   1 year ago Acute upper respiratory infection   Primary Care at [facility], Fenton Malling, MD   1 year ago Acute URI   Primary Care at [facility], Arlie Solomons, MD   1 year ago Essential hypertension   Primary Care at [place], MD       Future Appointments             In 4 months [name], MD Primary Care at [place], [facility]

## 2019-06-28 ENCOUNTER — Telehealth: Payer: Self-pay | Admitting: Family Medicine

## 2019-06-28 NOTE — Telephone Encounter (Signed)
Please advise 

## 2019-06-28 NOTE — Telephone Encounter (Signed)
06/28/2019 - PATIENT WOULD LIKE DR. STALLINGS TO KNOW THAT SHE HAS ANOTHER SINUS INFECTION. SHE HAS HAD IT FOR ABOUT 1 WEEK THIS TIME. STUFFY NOSE AND HEADACHE. I ASKED HER TO MAKE AN APPOINTMENT BUT SHE SAID DR. STALLINGS KNOWS HER HISTORY ON HER SINUS INFECTIONS. SHE WOULD LIKE TO GET SOMETHING CALLED INTO HER PHARMACY. BEST PHONE [phone] (CELL) PHARMACY CHOICE IS [facility] ON [address]. [place]

## 2019-07-01 NOTE — Telephone Encounter (Signed)
Spoke with pt an advised her of your recommendations. She stated that she really does not want to have the test done bc she has not been around anyone. Please Advise

## 2019-07-01 NOTE — Telephone Encounter (Signed)
Please let her know that due to COVID we have to evaluate her by virtual visit.  If she needs testing we will arrange for covid testing. Because of her risk of complications we have to assess her virtually.  Please let her know that these covid times have required changes.

## 2019-07-04 ENCOUNTER — Other Ambulatory Visit: Payer: Self-pay | Admitting: Family Medicine

## 2019-07-04 DIAGNOSIS — R062 Wheezing: Secondary | ICD-10-CM

## 2019-07-05 DIAGNOSIS — R0982 Postnasal drip: Secondary | ICD-10-CM | POA: Diagnosis not present

## 2019-07-05 DIAGNOSIS — Z6826 Body mass index (BMI) 26.0-26.9, adult: Secondary | ICD-10-CM | POA: Diagnosis not present

## 2019-07-05 DIAGNOSIS — R112 Nausea with vomiting, unspecified: Secondary | ICD-10-CM | POA: Diagnosis not present

## 2019-07-05 NOTE — Progress Notes (Signed)
[place] OFFICE PROGRESS NOTE  [name], MD [address] - [place] [address]  DIAGNOSIS: Stage IIIB(T4, N2, M0) non-small cell lung cancer, poorly differentiated squamous cell carcinoma. She presented with large right upper lobe lung mass in addition to right hilar and mediastinal lymphadenopathy diagnosed in January 2020.  PRIOR THERAPY: Concurrent chemoradiation with weekly carboplatin for AUC of 2 and paclitaxel 45 mg/M2. First dose July 13, 2018. Status post 8 cycles. Last dose was given August 31, 2018.  CURRENT THERAPY:  Consolidation treatment with immunotherapy with Imfinzi 10 mg/KG every 2 weeks. First dose starts October 13, 2018. Status post 19cycles.   INTERVAL HISTORY: Christina [name] 74 y.o. female returns to the clinic for a follow up visit. The patient is feeling well today without any concerning complaints.  Regarding her treatment, the patient continues to tolerate immunotherapy with imfinziwell without any adverse sideeffects. Denies any fever, chills, night sweats, or weight loss. Denies any chest pain, shortness of breath, cough, or hemoptysis. Denies any vomiting, diarrhea, or constipation except she reports a little bit of nausea which she associates with her GERD. She takes Prilosec for reflux. Denies any headache or visual changes. Denies any rashes or skin changes. The patient is here today for evaluation prior to starting cycle #20  MEDICAL HISTORY: Past Medical History:  Diagnosis Date  . Allergy   . Cancer ([place])    lung cancer  . GERD (gastroesophageal reflux disease)   . Hyperlipidemia   . Hypertension   . Reflux   . Tubulovillous adenoma of colon 2003    ALLERGIES:  is allergic to coconut oil; codeine; shrimp [shellfish allergy]; and trandolapril.  MEDICATIONS:  Current Outpatient Medications  Medication Sig Dispense Refill  . albuterol (VENTOLIN HFA) 108 (90 Base) MCG/ACT inhaler INHALE  2 PUFFS INTO THE LUNGS EVERY 6 HOURS AS NEEDED FOR WHEEZING OR SHORTNESS OF BREATH 6.7 g 2  . amLODipine (NORVASC) 10 MG tablet TAKE 1 TABLET(10 MG) BY MOUTH DAILY 90 tablet 3  . aspirin EC 81 MG tablet Take 81 mg by mouth daily.    Marland Kitchen atorvastatin (LIPITOR) 40 MG tablet TAKE 1 TABLET BY MOUTH DAILY 90 tablet 3  . HYDROcodone-acetaminophen (HYCET) 7.5-325 mg/15 ml solution Take 15 mLs by mouth 4 (four) times daily as needed. (Patient not taking: Reported on 01/05/2019) 473 mL 0  . omeprazole (PRILOSEC) 40 MG capsule TAKE 1 CAPSULE(40 MG) BY MOUTH DAILY 90 capsule 3  . potassium chloride (K-DUR) 10 MEQ tablet Take 1 tablet (10 mEq total) by mouth 2 (two) times daily. 60 tablet 3  . prochlorperazine (COMPAZINE) 10 MG tablet Take 1 tablet (10 mg total) by mouth every 6 (six) hours as needed for nausea or vomiting. (Patient not taking: Reported on 01/19/2019) 30 tablet 0  . sucralfate (CARAFATE) 1 g tablet Take 1 tablet (1 g total) by mouth 4 (four) times daily as needed (stomach acid). 120 tablet 6  . sucralfate (CARAFATE) 1 g tablet TAKE 1 TABLET BY MOUTH 1 HOUR BEFORE MEALS AND AT BEDTIME AS NEEDED 120 tablet prn   No current facility-administered medications for this visit.    SURGICAL HISTORY:  Past Surgical History:  Procedure Laterality Date  . CESAREAN SECTION     1 time  . COLONOSCOPY    . VIDEO BRONCHOSCOPY WITH ENDOBRONCHIAL NAVIGATION N/A 06/24/2018   Procedure: VIDEO BRONCHOSCOPY WITH ENDOBRONCHIAL NAVIGATION with biopsies, right upper lung lobe;  Surgeon: Melrose Nakayama, MD;  Location: [facility] OR;  Service: Thoracic;  Laterality: N/A;  . VIDEO BRONCHOSCOPY WITH ENDOBRONCHIAL ULTRASOUND N/A 06/24/2018   Procedure: VIDEO BRONCHOSCOPY WITH ENDOBRONCHIAL ULTRASOUND, right upper lung lobe;  Surgeon: Melrose Nakayama, MD;  Location: [facility] OR;  Service: Thoracic;  Laterality: N/A;    REVIEW OF SYSTEMS:   Review of Systems  Constitutional: Negative for appetite change, chills, fatigue,  fever and unexpected weight change.  HENT:   Negative for mouth sores, nosebleeds, sore throat and trouble swallowing.   Eyes: Negative for eye problems and icterus.  Respiratory: Negative for cough, hemoptysis, shortness of breath and wheezing.  Cardiovascular: Negative for chest pain and leg swelling.  Gastrointestinal: Positive for GERD and mild nausea. Negative for abdominal pain, constipation, diarrhea, and vomiting.  Genitourinary: Negative for bladder incontinence, difficulty urinating, dysuria, frequency and hematuria.   Musculoskeletal: Negative for back pain, gait problem, neck pain and neck stiffness.  Skin: Negative for itching and rash.  Neurological: Negative for dizziness, extremity weakness, gait problem, headaches, light-headedness and seizures.  Hematological: Negative for adenopathy. Does not bruise/bleed easily.  Psychiatric/Behavioral: Negative for confusion, depression and sleep disturbance. The patient is not nervous/anxious.     PHYSICAL EXAMINATION:  There were no vitals taken for this visit.  ECOG PERFORMANCE STATUS: 0 - Asymptomatic  Physical Exam  Constitutional: Oriented to person, place, and time and well-developed, well-nourished, and in no distress.  HENT:  Head: Normocephalic and atraumatic.  Mouth/Throat: Oropharynx is clear and moist. No oropharyngeal exudate.  Eyes: Conjunctivae are normal. Right eye exhibits no discharge. Left eye exhibits no discharge. No scleral icterus.  Neck: Normal range of motion. Neck supple.  Cardiovascular: Normal rate, regular rhythm, normal heart sounds and intact distal pulses.   Pulmonary/Chest: Effort normal and breath sounds normal. No respiratory distress. No wheezes. No rales.  Abdominal: Soft. Bowel sounds are normal. Exhibits no distension and no mass. There is no tenderness.  Musculoskeletal: Normal range of motion. Exhibits no edema.  Lymphadenopathy:    No cervical adenopathy.  Neurological: Alert and oriented  to person, place, and time. Exhibits normal muscle tone. Gait normal. Coordination normal.  Skin: Skin is warm and dry. No rash noted. Not diaphoretic. No erythema. No pallor.  Psychiatric: Mood, memory and judgment normal.  Vitals reviewed.  LABORATORY DATA: Lab Results  Component Value Date   WBC 5.2 06/22/2019   HGB 13.0 06/22/2019   HCT 39.7 06/22/2019   MCV 89.4 06/22/2019   PLT 310 06/22/2019      Chemistry      Component Value Date/Time   NA 141 06/22/2019 1038   NA 144 04/02/2017 1435   K 3.4 (L) 06/22/2019 1038   CL 106 06/22/2019 1038   CO2 25 06/22/2019 1038   BUN 12 06/22/2019 1038   BUN 11 04/02/2017 1435   CREATININE 0.96 06/22/2019 1038   CREATININE 0.87 03/22/2016 0908      Component Value Date/Time   CALCIUM 9.1 06/22/2019 1038   ALKPHOS 111 06/22/2019 1038   AST 19 06/22/2019 1038   ALT 15 06/22/2019 1038   BILITOT 0.5 06/22/2019 1038       RADIOGRAPHIC STUDIES:  CT Chest W Contrast  Result Date: 06/21/2019 CLINICAL DATA:  Restaging right lung cancer. EXAM: CT CHEST WITH CONTRAST TECHNIQUE: Multidetector CT imaging of the chest was performed during intravenous contrast administration. CONTRAST:  41mL OMNIPAQUE IOHEXOL 300 MG/ML  SOLN COMPARISON:  03/11/2019 FINDINGS: Cardiovascular: The heart size appears within normal limits. Small pericardial effusion identified. Aortic atherosclerosis.  RCA coronary artery atherosclerotic calcifications are identified. Mediastinum/Nodes: Large heterogeneous nodule in the left lobe of thyroid gland measures 3.1 cm. The trachea appears patent and is midline. Normal appearance of the esophagus. No adenopathy. Lungs/Pleura: Centrilobular emphysema. Scarring, volume loss, and masslike architectural distortion is identified within the anterior right upper lobe. The treated lung mass within the anterior right upper lobe measures 3.1 cm in maximum dimension, image 54/5. Previously 3.8 cm. Additional small pulmonary nodules within  the superior segment of the right lower lobe are identified and appear similar to previous exam, nonspecific, image 73/5. No new or progressive disease. Upper Abdomen: No acute abnormality. 6 mm low-attenuation structure in right hepatic lobe is unchanged. Too small to characterize. Musculoskeletal: No chest wall abnormality. No acute or significant osseous findings. IMPRESSION: 1. Stable to slight decrease in size of treated right upper lobe lung mass. 2. No new or progressive disease identified. 3. Emphysema and aortic atherosclerosis. RCA coronary artery calcifications noted. 4. Large heterogeneous nodule within the left lobe of thyroid gland. Recommend thyroid US (ref: J Am Coll Radiol. 2015 Feb;12(2): 143-50). Aortic Atherosclerosis (ICD10-I70.0) and Emphysema (ICD10-J43.9). Electronically Signed   By: Kerby Moors M.D.   On: 06/21/2019 16:40     ASSESSMENT/PLAN:  This is a very pleasant 74 year old African-American female diagnosed with stage IIIb(T4, N2, M0) non-small cell lung cancer, poorly differentiated squamous cell carcinoma. She presented with a right upper lung mass in addition to right hilar and mediastinal lymphadenopathy. She was diagnosed in January 2020.  The patient previously received concurrent chemoradiation with weekly carboplatin for an AUC of 2 and paclitaxel 45 mg/m. She is status post 8 cycles.  The patient is currently undergoing consolidation immunotherapy with Imfinzi 10 mg/kg IV every 2 weeks. She is status post19cycles of treatment. She has been tolerating it well without any adverse side effects.  Labs were reviewed. I recommend that she proceed with cycle #20 today as scheduled.   We will see her back for a follow up visit in 2 weeks for evaluation before starting cycle #21.   The patient was advised to call immediately if she has any concerning symptoms in the interval. The patient voices understanding of current disease status and treatment options  and is in agreement with the current care plan. All questions were answered. The patient knows to call the clinic with any problems, questions or concerns. We can certainly see the patient much sooner if necessary  No orders of the defined types were placed in this encounter.     L , PA-C 07/05/19

## 2019-07-06 ENCOUNTER — Other Ambulatory Visit: Payer: Self-pay

## 2019-07-06 ENCOUNTER — Inpatient Hospital Stay: Payer: BC Managed Care – PPO | Admitting: Physician Assistant

## 2019-07-06 ENCOUNTER — Encounter: Payer: Self-pay | Admitting: Physician Assistant

## 2019-07-06 ENCOUNTER — Inpatient Hospital Stay: Payer: BC Managed Care – PPO

## 2019-07-06 VITALS — BP 123/78 | HR 100 | Temp 98.2°F | Resp 19 | Ht 67.0 in | Wt 177.7 lb

## 2019-07-06 DIAGNOSIS — Z5112 Encounter for antineoplastic immunotherapy: Secondary | ICD-10-CM | POA: Diagnosis not present

## 2019-07-06 DIAGNOSIS — I10 Essential (primary) hypertension: Secondary | ICD-10-CM | POA: Diagnosis not present

## 2019-07-06 DIAGNOSIS — C3491 Malignant neoplasm of unspecified part of right bronchus or lung: Secondary | ICD-10-CM | POA: Diagnosis not present

## 2019-07-06 DIAGNOSIS — Z7982 Long term (current) use of aspirin: Secondary | ICD-10-CM | POA: Diagnosis not present

## 2019-07-06 DIAGNOSIS — K219 Gastro-esophageal reflux disease without esophagitis: Secondary | ICD-10-CM | POA: Diagnosis not present

## 2019-07-06 DIAGNOSIS — Z79899 Other long term (current) drug therapy: Secondary | ICD-10-CM | POA: Diagnosis not present

## 2019-07-06 DIAGNOSIS — E785 Hyperlipidemia, unspecified: Secondary | ICD-10-CM | POA: Diagnosis not present

## 2019-07-06 DIAGNOSIS — Z9221 Personal history of antineoplastic chemotherapy: Secondary | ICD-10-CM | POA: Diagnosis not present

## 2019-07-06 DIAGNOSIS — C3411 Malignant neoplasm of upper lobe, right bronchus or lung: Secondary | ICD-10-CM | POA: Diagnosis not present

## 2019-07-06 DIAGNOSIS — Z923 Personal history of irradiation: Secondary | ICD-10-CM | POA: Diagnosis not present

## 2019-07-06 DIAGNOSIS — R591 Generalized enlarged lymph nodes: Secondary | ICD-10-CM | POA: Diagnosis not present

## 2019-07-06 DIAGNOSIS — Z87891 Personal history of nicotine dependence: Secondary | ICD-10-CM | POA: Diagnosis not present

## 2019-07-06 LAB — CMP (CANCER CENTER ONLY)
ALT: 11 U/L (ref 0–44)
AST: 14 U/L — ABNORMAL LOW (ref 15–41)
Albumin: 3.4 g/dL — ABNORMAL LOW (ref 3.5–5.0)
Alkaline Phosphatase: 95 U/L (ref 38–126)
Anion gap: 8 (ref 5–15)
BUN: 18 mg/dL (ref 8–23)
CO2: 27 mmol/L (ref 22–32)
Calcium: 8.5 mg/dL — ABNORMAL LOW (ref 8.9–10.3)
Chloride: 106 mmol/L (ref 98–111)
Creatinine: 1.07 mg/dL — ABNORMAL HIGH (ref 0.44–1.00)
GFR, Est AFR Am: >60 mL/min (ref >60)
GFR, Estimated: 52 mL/min — ABNORMAL LOW (ref >60)
Glucose, Bld: 120 mg/dL — ABNORMAL HIGH (ref 70–99)
Potassium: 3.5 mmol/L (ref 3.5–5.1)
Sodium: 141 mmol/L (ref 135–145)
Total Bilirubin: 0.3 mg/dL (ref 0.3–1.2)
Total Protein: 7 g/dL (ref 6.5–8.1)

## 2019-07-06 LAB — CBC WITH DIFFERENTIAL (CANCER CENTER ONLY)
Abs Immature Granulocytes: 0.02 10*3/uL (ref 0.00–0.07)
Basophils Absolute: 0.1 10*3/uL (ref 0.0–0.1)
Basophils Relative: 1 %
Eosinophils Absolute: 0.1 10*3/uL (ref 0.0–0.5)
Eosinophils Relative: 1 %
HCT: 40 % (ref 36.0–46.0)
Hemoglobin: 12.7 g/dL (ref 12.0–15.0)
Immature Granulocytes: 0 %
Lymphocytes Relative: 22 %
Lymphs Abs: 1.2 10*3/uL (ref 0.7–4.0)
MCH: 28.6 pg (ref 26.0–34.0)
MCHC: 31.8 g/dL (ref 30.0–36.0)
MCV: 90.1 fL (ref 80.0–100.0)
Monocytes Absolute: 0.4 10*3/uL (ref 0.1–1.0)
Monocytes Relative: 8 %
Neutro Abs: 3.8 10*3/uL (ref 1.7–7.7)
Neutrophils Relative %: 68 %
Platelet Count: 438 10*3/uL — ABNORMAL HIGH (ref 150–400)
RBC: 4.44 MIL/uL (ref 3.87–5.11)
RDW: 14.7 % (ref 11.5–15.5)
WBC Count: 5.5 10*3/uL (ref 4.0–10.5)
nRBC: 0 % (ref 0.0–0.2)

## 2019-07-06 MED ORDER — SODIUM CHLORIDE 0.9 % IV SOLN
9.6000 mg/kg | Freq: Once | INTRAVENOUS | Status: AC
  Administered 2019-07-06: 740 mg via INTRAVENOUS
  Filled 2019-07-06: qty 4.8

## 2019-07-06 MED ORDER — SODIUM CHLORIDE 0.9 % IV SOLN
Freq: Once | INTRAVENOUS | Status: AC
  Filled 2019-07-06: qty 250

## 2019-07-06 NOTE — Patient Instructions (Signed)
[facility] Discharge Instructions for Patients Receiving Chemotherapy  Today you received the following chemotherapy agents: Imfinzi.  To help prevent nausea and vomiting after your treatment, we encourage you to take your nausea medication as directed.   If you develop nausea and vomiting that is not controlled by your nausea medication, call the clinic.   BELOW ARE SYMPTOMS THAT SHOULD BE REPORTED IMMEDIATELY:  *FEVER GREATER THAN 100.5 F  *CHILLS WITH OR WITHOUT FEVER  NAUSEA AND VOMITING THAT IS NOT CONTROLLED WITH YOUR NAUSEA MEDICATION  *UNUSUAL SHORTNESS OF BREATH  *UNUSUAL BRUISING OR BLEEDING  TENDERNESS IN MOUTH AND THROAT WITH OR WITHOUT PRESENCE OF ULCERS  *URINARY PROBLEMS  *BOWEL PROBLEMS  UNUSUAL RASH Items with * indicate a potential emergency and should be followed up as soon as possible.  Feel free to call the clinic should you have any questions or concerns. The clinic phone number is [phone].  Please show the CHEMO ALERT CARD at check-in to the Emergency Department and triage nurse.   

## 2019-07-07 ENCOUNTER — Telehealth: Payer: Self-pay | Admitting: Physician Assistant

## 2019-07-07 NOTE — Telephone Encounter (Signed)
Appts already scheduled per 1/19 los.

## 2019-07-19 ENCOUNTER — Other Ambulatory Visit: Payer: Self-pay

## 2019-07-19 ENCOUNTER — Inpatient Hospital Stay: Payer: BC Managed Care – PPO

## 2019-07-19 ENCOUNTER — Inpatient Hospital Stay: Payer: BC Managed Care – PPO | Attending: Internal Medicine | Admitting: Internal Medicine

## 2019-07-19 ENCOUNTER — Encounter: Payer: Self-pay | Admitting: Internal Medicine

## 2019-07-19 VITALS — BP 148/91 | HR 95 | Temp 97.3°F | Resp 19 | Ht 67.0 in | Wt 179.1 lb

## 2019-07-19 DIAGNOSIS — Z9221 Personal history of antineoplastic chemotherapy: Secondary | ICD-10-CM | POA: Diagnosis not present

## 2019-07-19 DIAGNOSIS — C3491 Malignant neoplasm of unspecified part of right bronchus or lung: Secondary | ICD-10-CM

## 2019-07-19 DIAGNOSIS — K219 Gastro-esophageal reflux disease without esophagitis: Secondary | ICD-10-CM | POA: Insufficient documentation

## 2019-07-19 DIAGNOSIS — I10 Essential (primary) hypertension: Secondary | ICD-10-CM | POA: Diagnosis not present

## 2019-07-19 DIAGNOSIS — E785 Hyperlipidemia, unspecified: Secondary | ICD-10-CM | POA: Diagnosis not present

## 2019-07-19 DIAGNOSIS — R591 Generalized enlarged lymph nodes: Secondary | ICD-10-CM | POA: Diagnosis not present

## 2019-07-19 DIAGNOSIS — Z5112 Encounter for antineoplastic immunotherapy: Secondary | ICD-10-CM | POA: Insufficient documentation

## 2019-07-19 DIAGNOSIS — Z87891 Personal history of nicotine dependence: Secondary | ICD-10-CM | POA: Insufficient documentation

## 2019-07-19 DIAGNOSIS — C3411 Malignant neoplasm of upper lobe, right bronchus or lung: Secondary | ICD-10-CM | POA: Diagnosis not present

## 2019-07-19 DIAGNOSIS — Z7982 Long term (current) use of aspirin: Secondary | ICD-10-CM | POA: Diagnosis not present

## 2019-07-19 DIAGNOSIS — Z79899 Other long term (current) drug therapy: Secondary | ICD-10-CM | POA: Diagnosis not present

## 2019-07-19 DIAGNOSIS — Z923 Personal history of irradiation: Secondary | ICD-10-CM | POA: Diagnosis not present

## 2019-07-19 LAB — CBC WITH DIFFERENTIAL (CANCER CENTER ONLY)
Abs Immature Granulocytes: 0.02 10*3/uL (ref 0.00–0.07)
Basophils Absolute: 0.1 10*3/uL (ref 0.0–0.1)
Basophils Relative: 1 %
Eosinophils Absolute: 0.1 10*3/uL (ref 0.0–0.5)
Eosinophils Relative: 2 %
HCT: 40.8 % (ref 36.0–46.0)
Hemoglobin: 13.2 g/dL (ref 12.0–15.0)
Immature Granulocytes: 0 %
Lymphocytes Relative: 22 %
Lymphs Abs: 1.2 10*3/uL (ref 0.7–4.0)
MCH: 28.7 pg (ref 26.0–34.0)
MCHC: 32.4 g/dL (ref 30.0–36.0)
MCV: 88.7 fL (ref 80.0–100.0)
Monocytes Absolute: 0.5 10*3/uL (ref 0.1–1.0)
Monocytes Relative: 10 %
Neutro Abs: 3.4 10*3/uL (ref 1.7–7.7)
Neutrophils Relative %: 65 %
Platelet Count: 320 10*3/uL (ref 150–400)
RBC: 4.6 MIL/uL (ref 3.87–5.11)
RDW: 15.4 % (ref 11.5–15.5)
WBC Count: 5.3 10*3/uL (ref 4.0–10.5)
nRBC: 0 % (ref 0.0–0.2)

## 2019-07-19 LAB — CMP (CANCER CENTER ONLY)
ALT: 17 U/L (ref 0–44)
AST: 16 U/L (ref 15–41)
Albumin: 3.7 g/dL (ref 3.5–5.0)
Alkaline Phosphatase: 98 U/L (ref 38–126)
Anion gap: 9 (ref 5–15)
BUN: 15 mg/dL (ref 8–23)
CO2: 25 mmol/L (ref 22–32)
Calcium: 9 mg/dL (ref 8.9–10.3)
Chloride: 107 mmol/L (ref 98–111)
Creatinine: 0.91 mg/dL (ref 0.44–1.00)
GFR, Est AFR Am: >60 mL/min (ref >60)
GFR, Estimated: >60 mL/min (ref >60)
Glucose, Bld: 83 mg/dL (ref 70–99)
Potassium: 3.8 mmol/L (ref 3.5–5.1)
Sodium: 141 mmol/L (ref 135–145)
Total Bilirubin: 0.4 mg/dL (ref 0.3–1.2)
Total Protein: 7.3 g/dL (ref 6.5–8.1)

## 2019-07-19 LAB — TSH: TSH: 0.249 u[IU]/mL — ABNORMAL LOW (ref 0.308–3.960)

## 2019-07-19 MED ORDER — SODIUM CHLORIDE 0.9 % IV SOLN
Freq: Once | INTRAVENOUS | Status: AC
  Filled 2019-07-19: qty 250

## 2019-07-19 MED ORDER — SODIUM CHLORIDE 0.9 % IV SOLN
9.6000 mg/kg | Freq: Once | INTRAVENOUS | Status: AC
  Administered 2019-07-19: 740 mg via INTRAVENOUS
  Filled 2019-07-19: qty 10

## 2019-07-19 NOTE — Progress Notes (Signed)
[place] Telephone:([phone]) [phone]   Fax:([phone]  OFFICE PROGRESS NOTE  [name], MD [address] - [place] [address]  DIAGNOSIS: Stage IIIB (T4, N2, M0) non-small cell lung cancer, poorly differentiated squamous cell carcinoma.  She presented with large right upper lobe lung mass in addition to right hilar and mediastinal lymphadenopathy diagnosed in January 2020.  PRIOR THERAPY: Concurrent chemoradiation with weekly carboplatin for AUC of 2 and paclitaxel 45 mg/M2.  First dose July 13, 2018.  Status post 8 cycles.  Last dose was given August 31, 2018.  CURRENT THERAPY: Consolidation treatment with immunotherapy with Imfinzi 10 mg/KG every 2 weeks.  First dose starts October 13, 2018.  Status post 20 cycles.  INTERVAL HISTORY: Christina [name] 74 y.o. female returns to the clinic today for follow-up visit. The patient is feeling fine today with no concerning complaints. She has been tolerating her treatment with immunotherapy fairly well. She denied having any chest pain, shortness of breath, cough or hemoptysis. She denied having any fever or chills. She has no nausea, vomiting, diarrhea or constipation. She has no headache or visual changes. She is here today for evaluation before starting cycle #21 of her treatment.  MEDICAL HISTORY: Past Medical History:  Diagnosis Date  . Allergy   . Cancer ([place])    lung cancer  . GERD (gastroesophageal reflux disease)   . Hyperlipidemia   . Hypertension   . Reflux   . Tubulovillous adenoma of colon 2003    ALLERGIES:  is allergic to coconut oil; codeine; shrimp [shellfish allergy]; and trandolapril.  MEDICATIONS:  Current Outpatient Medications  Medication Sig Dispense Refill  . albuterol (VENTOLIN HFA) 108 (90 Base) MCG/ACT inhaler INHALE 2 PUFFS INTO THE LUNGS EVERY 6 HOURS AS NEEDED FOR WHEEZING OR SHORTNESS OF BREATH 6.7 g 2  . amLODipine (NORVASC) 10 MG tablet TAKE 1  TABLET(10 MG) BY MOUTH DAILY 90 tablet 3  . aspirin EC 81 MG tablet Take 81 mg by mouth daily.    Marland Kitchen atorvastatin (LIPITOR) 40 MG tablet TAKE 1 TABLET BY MOUTH DAILY 90 tablet 3  . fluticasone (FLONASE) 50 MCG/ACT nasal spray     . HYDROcodone-acetaminophen (HYCET) 7.5-325 mg/15 ml solution Take 15 mLs by mouth 4 (four) times daily as needed. (Patient not taking: Reported on 01/05/2019) 473 mL 0  . omeprazole (PRILOSEC) 40 MG capsule TAKE 1 CAPSULE(40 MG) BY MOUTH DAILY 90 capsule 3  . ondansetron (ZOFRAN-ODT) 4 MG disintegrating tablet     . potassium chloride (K-DUR) 10 MEQ tablet Take 1 tablet (10 mEq total) by mouth 2 (two) times daily. 60 tablet 3  . prochlorperazine (COMPAZINE) 10 MG tablet Take 1 tablet (10 mg total) by mouth every 6 (six) hours as needed for nausea or vomiting. 30 tablet 0  . sucralfate (CARAFATE) 1 g tablet Take 1 tablet (1 g total) by mouth 4 (four) times daily as needed (stomach acid). 120 tablet 6   No current facility-administered medications for this visit.    SURGICAL HISTORY:  Past Surgical History:  Procedure Laterality Date  . CESAREAN SECTION     1 time  . COLONOSCOPY    . VIDEO BRONCHOSCOPY WITH ENDOBRONCHIAL NAVIGATION N/A 06/24/2018   Procedure: VIDEO BRONCHOSCOPY WITH ENDOBRONCHIAL NAVIGATION with biopsies, right upper lung lobe;  Surgeon: Melrose Nakayama, MD;  Location: [facility] OR;  Service: Thoracic;  Laterality: N/A;  . VIDEO BRONCHOSCOPY WITH ENDOBRONCHIAL ULTRASOUND N/A 06/24/2018  Procedure: VIDEO BRONCHOSCOPY WITH ENDOBRONCHIAL ULTRASOUND, right upper lung lobe;  Surgeon: Melrose Nakayama, MD;  Location: [facility] OR;  Service: Thoracic;  Laterality: N/A;    REVIEW OF SYSTEMS:  A comprehensive review of systems was negative.   PHYSICAL EXAMINATION: General appearance: alert, cooperative and no distress Head: Normocephalic, without obvious abnormality, atraumatic Neck: no adenopathy, no JVD, supple, symmetrical, trachea midline and thyroid not  enlarged, symmetric, no tenderness/mass/nodules Lymph nodes: Cervical, supraclavicular, and axillary nodes normal. Resp: clear to auscultation bilaterally Back: symmetric, no curvature. ROM normal. No CVA tenderness. Cardio: regular rate and rhythm, S1, S2 normal, no murmur, click, rub or gallop GI: soft, non-tender; bowel sounds normal; no masses,  no organomegaly Extremities: extremities normal, atraumatic, no cyanosis or edema  ECOG PERFORMANCE STATUS: 0 - Asymptomatic  Blood pressure (!) 148/91, pulse 95, temperature (!) 97.3 F (36.3 C), temperature source Temporal, resp. rate 19, height 5\' 7"  (1.702 m), weight 179 lb 1.6 oz (81.2 kg), SpO2 98 %.  LABORATORY DATA: Lab Results  Component Value Date   WBC 5.3 07/19/2019   HGB 13.2 07/19/2019   HCT 40.8 07/19/2019   MCV 88.7 07/19/2019   PLT 320 07/19/2019      Chemistry      Component Value Date/Time   NA 141 07/06/2019 1041   NA 144 04/02/2017 1435   K 3.5 07/06/2019 1041   CL 106 07/06/2019 1041   CO2 27 07/06/2019 1041   BUN 18 07/06/2019 1041   BUN 11 04/02/2017 1435   CREATININE 1.07 (H) 07/06/2019 1041   CREATININE 0.87 03/22/2016 0908      Component Value Date/Time   CALCIUM 8.5 (L) 07/06/2019 1041   ALKPHOS 95 07/06/2019 1041   AST 14 (L) 07/06/2019 1041   ALT 11 07/06/2019 1041   BILITOT 0.3 07/06/2019 1041       RADIOGRAPHIC STUDIES: CT Chest W Contrast  Result Date: 06/21/2019 CLINICAL DATA:  Restaging right lung cancer. EXAM: CT CHEST WITH CONTRAST TECHNIQUE: Multidetector CT imaging of the chest was performed during intravenous contrast administration. CONTRAST:  51mL OMNIPAQUE IOHEXOL 300 MG/ML  SOLN COMPARISON:  03/11/2019 FINDINGS: Cardiovascular: The heart size appears within normal limits. Small pericardial effusion identified. Aortic atherosclerosis. RCA coronary artery atherosclerotic calcifications are identified. Mediastinum/Nodes: Large heterogeneous nodule in the left lobe of thyroid gland  measures 3.1 cm. The trachea appears patent and is midline. Normal appearance of the esophagus. No adenopathy. Lungs/Pleura: Centrilobular emphysema. Scarring, volume loss, and masslike architectural distortion is identified within the anterior right upper lobe. The treated lung mass within the anterior right upper lobe measures 3.1 cm in maximum dimension, image 54/5. Previously 3.8 cm. Additional small pulmonary nodules within the superior segment of the right lower lobe are identified and appear similar to previous exam, nonspecific, image 73/5. No new or progressive disease. Upper Abdomen: No acute abnormality. 6 mm low-attenuation structure in right hepatic lobe is unchanged. Too small to characterize. Musculoskeletal: No chest wall abnormality. No acute or significant osseous findings. IMPRESSION: 1. Stable to slight decrease in size of treated right upper lobe lung mass. 2. No new or progressive disease identified. 3. Emphysema and aortic atherosclerosis. RCA coronary artery calcifications noted. 4. Large heterogeneous nodule within the left lobe of thyroid gland. Recommend thyroid US (ref: J Am Coll Radiol. 2015 Feb;12(2): 143-50). Aortic Atherosclerosis (ICD10-I70.0) and Emphysema (ICD10-J43.9). Electronically Signed   By: Kerby Moors M.D.   On: 06/21/2019 16:40    ASSESSMENT AND PLAN: This is a very pleasant  74 years old African-American female recently diagnosed with a stage IIIB (T4, N2, M0) non-small cell lung cancer, poorly differentiated squamous cell carcinoma presented with right upper lobe lung mass in addition to right hilar and mediastinal lymphadenopathy. The patient underwent concurrent chemoradiation with weekly carboplatin and paclitaxel status post 8 cycles. She has partial response to this treatment. The patient was started on consolidation treatment with immunotherapy with Imfinzi 10 mg/KG every 2 weeks status post 20 cycles.   She continues to tolerate her treatment well with no  concerning adverse effects. I recommended for her to proceed with cycle #21 today as planned. I will see the patient back for follow-up visit in 2 weeks for evaluation before the next cycle of her treatment. For the hypertension, she was advised to take her blood pressure medications as prescribed and to monitor it closely at home. The patient was advised to call immediately if she has any concerning symptoms in the interval. The patient voices understanding of current disease status and treatment options and is in agreement with the current care plan.  All questions were answered. The patient knows to call the clinic with any problems, questions or concerns. We can certainly see the patient much sooner if necessary.  Disclaimer: This note was dictated with voice recognition software. Similar sounding words can inadvertently be transcribed and may not be corrected upon review.

## 2019-07-19 NOTE — Patient Instructions (Signed)
[facility] Discharge Instructions for Patients Receiving Chemotherapy  Today you received the following chemotherapy agents: Imfinzi.  To help prevent nausea and vomiting after your treatment, we encourage you to take your nausea medication as directed.   If you develop nausea and vomiting that is not controlled by your nausea medication, call the clinic.   BELOW ARE SYMPTOMS THAT SHOULD BE REPORTED IMMEDIATELY:  *FEVER GREATER THAN 100.5 F  *CHILLS WITH OR WITHOUT FEVER  NAUSEA AND VOMITING THAT IS NOT CONTROLLED WITH YOUR NAUSEA MEDICATION  *UNUSUAL SHORTNESS OF BREATH  *UNUSUAL BRUISING OR BLEEDING  TENDERNESS IN MOUTH AND THROAT WITH OR WITHOUT PRESENCE OF ULCERS  *URINARY PROBLEMS  *BOWEL PROBLEMS  UNUSUAL RASH Items with * indicate a potential emergency and should be followed up as soon as possible.  Feel free to call the clinic should you have any questions or concerns. The clinic phone number is [phone].  Please show the CHEMO ALERT CARD at check-in to the Emergency Department and triage nurse.   

## 2019-07-21 ENCOUNTER — Telehealth: Payer: Self-pay | Admitting: Internal Medicine

## 2019-07-21 NOTE — Telephone Encounter (Signed)
Scheduled per los. Called and left msg about added appts. Mailed printout

## 2019-07-31 ENCOUNTER — Ambulatory Visit: Payer: BC Managed Care – PPO | Attending: Internal Medicine

## 2019-07-31 DIAGNOSIS — Z23 Encounter for immunization: Secondary | ICD-10-CM | POA: Insufficient documentation

## 2019-07-31 NOTE — Progress Notes (Signed)
   Covid-19 Vaccination Clinic  Name:  Christina [name]    MRN: [id number] DOB: November 23, 1945  07/31/2019  [name] was observed post Covid-19 immunization for 30 minutes based on pre-vaccination screening without incidence. She was provided with Vaccine Information Sheet and instruction to access the V-Safe system.   [name] was instructed to call 911 with any severe reactions post vaccine: Marland Kitchen Difficulty breathing  . Swelling of your face and throat  . A fast heartbeat  . A bad rash all over your body  . Dizziness and weakness    Immunizations Administered    Name Date Dose VIS Date Route   Pfizer COVID-19 Vaccine 07/31/2019  9:26 AM 0.3 mL 05/28/2019 Intramuscular   Manufacturer: [place]   Lot: [id number]   [place]: [id number]

## 2019-08-02 NOTE — Progress Notes (Signed)
[facility] OFFICE PROGRESS NOTE  [name], MD [address] [facility] [place] [address]  DIAGNOSIS: Stage IIIB(T4, N2, M0) non-small cell lung cancer, poorly differentiated squamous cell carcinoma. She presented with large right upper lobe lung mass in addition to right hilar and mediastinal lymphadenopathy diagnosed in January 2020.  PRIOR THERAPY: Concurrent chemoradiation with weekly carboplatin for AUC of 2 and paclitaxel 45 mg/M2. First dose July 13, 2018. Status post 8 cycles. Last dose was given August 31, 2018.  CURRENT THERAPY: Consolidation treatment with immunotherapy with Imfinzi 10 mg/KG every 2 weeks. First dose starts October 13, 2018. Status post 21cycles.  INTERVAL HISTORY: Christina [name] 74 y.o. female returns to the clinic for a follow up visit. The patient is feeling well today without any concerning complaints.  She recently received her first covid-19 vaccine. Regarding her treatment, the patient continues to tolerate immunotherapy with imfinziwell without any adverse sideeffects. Denies any fever, chills, night sweats, or weight loss. Denies any chest pain, shortness of breath, cough, or hemoptysis. Denies any vomiting, diarrhea, or constipation except she reports a little bit of nausea which she associates with her GERD. She takes Prilosec for reflux. Denies any headache or visual changes. Denies any rashes or skin changes. The patient is here today for evaluation prior to starting cycle #22  MEDICAL HISTORY: Past Medical History:  Diagnosis Date  . Allergy   . Cancer ([place])    lung cancer  . GERD (gastroesophageal reflux disease)   . Hyperlipidemia   . Hypertension   . Reflux   . Tubulovillous adenoma of colon 2003    ALLERGIES:  is allergic to coconut oil; codeine; shrimp [shellfish allergy]; and trandolapril.  MEDICATIONS:  Current Outpatient Medications  Medication Sig Dispense Refill  . albuterol  (VENTOLIN HFA) 108 (90 Base) MCG/ACT inhaler INHALE 2 PUFFS INTO THE LUNGS EVERY 6 HOURS AS NEEDED FOR WHEEZING OR SHORTNESS OF BREATH 6.7 g 2  . amLODipine (NORVASC) 10 MG tablet TAKE 1 TABLET(10 MG) BY MOUTH DAILY 90 tablet 3  . aspirin EC 81 MG tablet Take 81 mg by mouth daily.    Marland Kitchen atorvastatin (LIPITOR) 40 MG tablet TAKE 1 TABLET BY MOUTH DAILY 90 tablet 3  . fluticasone (FLONASE) 50 MCG/ACT nasal spray     . HYDROcodone-acetaminophen (HYCET) 7.5-325 mg/15 ml solution Take 15 mLs by mouth 4 (four) times daily as needed. (Patient not taking: Reported on 01/05/2019) 473 mL 0  . omeprazole (PRILOSEC) 40 MG capsule TAKE 1 CAPSULE(40 MG) BY MOUTH DAILY 90 capsule 3  . ondansetron (ZOFRAN-ODT) 4 MG disintegrating tablet     . potassium chloride (K-DUR) 10 MEQ tablet Take 1 tablet (10 mEq total) by mouth 2 (two) times daily. 60 tablet 3  . prochlorperazine (COMPAZINE) 10 MG tablet Take 1 tablet (10 mg total) by mouth every 6 (six) hours as needed for nausea or vomiting. 30 tablet 0  . sucralfate (CARAFATE) 1 g tablet Take 1 tablet (1 g total) by mouth 4 (four) times daily as needed (stomach acid). 120 tablet 6   No current facility-administered medications for this visit.    SURGICAL HISTORY:  Past Surgical History:  Procedure Laterality Date  . CESAREAN SECTION     1 time  . COLONOSCOPY    . VIDEO BRONCHOSCOPY WITH ENDOBRONCHIAL NAVIGATION N/A 06/24/2018   Procedure: VIDEO BRONCHOSCOPY WITH ENDOBRONCHIAL NAVIGATION with biopsies, right upper lung lobe;  Surgeon: Melrose Nakayama, MD;  Location: [facility]  OR;  Service: Thoracic;  Laterality: N/A;  . VIDEO BRONCHOSCOPY WITH ENDOBRONCHIAL ULTRASOUND N/A 06/24/2018   Procedure: VIDEO BRONCHOSCOPY WITH ENDOBRONCHIAL ULTRASOUND, right upper lung lobe;  Surgeon: Melrose Nakayama, MD;  Location: [facility] OR;  Service: Thoracic;  Laterality: N/A;    REVIEW OF SYSTEMS:   Review of Systems  Constitutional: Negative for appetite change, chills, fatigue,  fever and unexpected weight change.  HENT:   Negative for mouth sores, nosebleeds, sore throat and trouble swallowing.   Eyes: Negative for eye problems and icterus.  Respiratory: Negative for cough, hemoptysis, shortness of breath and wheezing.  Cardiovascular: Negative for chest pain and leg swelling.  Gastrointestinal: Positive for GERD and mild nausea. Negative for abdominal pain, constipation, diarrhea, and vomiting.  Genitourinary: Negative for bladder incontinence, difficulty urinating, dysuria, frequency and hematuria.   Musculoskeletal: Negative for back pain, gait problem, neck pain and neck stiffness.  Skin: Negative for itching and rash.  Neurological: Negative for dizziness, extremity weakness, gait problem, headaches, light-headedness and seizures.  Hematological: Negative for adenopathy. Does not bruise/bleed easily.  Psychiatric/Behavioral: Negative for confusion, depression and sleep disturbance. The patient is not nervous/anxious.     PHYSICAL EXAMINATION:  There were no vitals taken for this visit.  ECOG PERFORMANCE STATUS: 0 - Asymptomatic  Physical Exam  Constitutional: Oriented to person, place, and time and well-developed, well-nourished, and in no distress.  HENT:  Head: Normocephalic and atraumatic.  Mouth/Throat: Oropharynx is clear and moist. No oropharyngeal exudate.  Eyes: Conjunctivae are normal. Right eye exhibits no discharge. Left eye exhibits no discharge. No scleral icterus.  Neck: Normal range of motion. Neck supple.  Cardiovascular: Normal rate, regular rhythm, normal heart sounds and intact distal pulses.   Pulmonary/Chest: Effort normal and breath sounds normal. No respiratory distress. No wheezes. No rales.  Abdominal: Soft. Bowel sounds are normal. Exhibits no distension and no mass. There is no tenderness.  Musculoskeletal: Normal range of motion. Exhibits no edema.  Lymphadenopathy:    No cervical adenopathy.  Neurological: Alert and oriented  to person, place, and time. Exhibits normal muscle tone. Gait normal. Coordination normal.  Skin: Skin is warm and dry. No rash noted. Not diaphoretic. No erythema. No pallor.  Psychiatric: Mood, memory and judgment normal.  Vitals reviewed.  LABORATORY DATA: Lab Results  Component Value Date   WBC 5.3 07/19/2019   HGB 13.2 07/19/2019   HCT 40.8 07/19/2019   MCV 88.7 07/19/2019   PLT 320 07/19/2019      Chemistry      Component Value Date/Time   NA 141 07/19/2019 1308   NA 144 04/02/2017 1435   K 3.8 07/19/2019 1308   CL 107 07/19/2019 1308   CO2 25 07/19/2019 1308   BUN 15 07/19/2019 1308   BUN 11 04/02/2017 1435   CREATININE 0.91 07/19/2019 1308   CREATININE 0.87 03/22/2016 0908      Component Value Date/Time   CALCIUM 9.0 07/19/2019 1308   ALKPHOS 98 07/19/2019 1308   AST 16 07/19/2019 1308   ALT 17 07/19/2019 1308   BILITOT 0.4 07/19/2019 1308       RADIOGRAPHIC STUDIES:  No results found.   ASSESSMENT/PLAN:  This is a very pleasant 74 year old African-American female diagnosed with stage IIIb(T4, N2, M0) non-small cell lung cancer, poorly differentiated squamous cell carcinoma. She presented with a right upper lung mass in addition to right hilar and mediastinal lymphadenopathy. She was diagnosed in January 2020.  The patient previously received concurrent chemoradiation with weekly carboplatin for  an AUC of 2 and paclitaxel 45 mg/m. She is status post 8 cycles.  The patient is currently undergoing consolidation immunotherapy with Imfinzi 10 mg/kg IV every 2 weeks. She is status post21cycles of treatment. She has been tolerating it well without any adverse side effects.  Labs were reviewed. I recommend that she proceed with cycle #22today as scheduled.   We will see her back for a follow up visit in 2 weeks for evaluation before starting cycle #23.   The patient was advised to call immediately if she has any concerning symptoms in the  interval. The patient voices understanding of current disease status and treatment options and is in agreement with the current care plan. All questions were answered. The patient knows to call the clinic with any problems, questions or concerns. We can certainly see the patient much sooner if necessary.   No orders of the defined types were placed in this encounter.    Ramonia Mcclaran L Quanetta Truss, PA-C 08/02/19

## 2019-08-03 ENCOUNTER — Inpatient Hospital Stay: Payer: BC Managed Care – PPO

## 2019-08-03 ENCOUNTER — Encounter: Payer: Self-pay | Admitting: Physician Assistant

## 2019-08-03 ENCOUNTER — Other Ambulatory Visit: Payer: Self-pay

## 2019-08-03 ENCOUNTER — Inpatient Hospital Stay (HOSPITAL_BASED_OUTPATIENT_CLINIC_OR_DEPARTMENT_OTHER): Payer: BC Managed Care – PPO | Admitting: Physician Assistant

## 2019-08-03 VITALS — BP 135/88 | HR 102 | Temp 98.3°F | Resp 20 | Ht 67.0 in | Wt 179.1 lb

## 2019-08-03 DIAGNOSIS — Z87891 Personal history of nicotine dependence: Secondary | ICD-10-CM | POA: Diagnosis not present

## 2019-08-03 DIAGNOSIS — C3491 Malignant neoplasm of unspecified part of right bronchus or lung: Secondary | ICD-10-CM

## 2019-08-03 DIAGNOSIS — Z5112 Encounter for antineoplastic immunotherapy: Secondary | ICD-10-CM

## 2019-08-03 DIAGNOSIS — C3411 Malignant neoplasm of upper lobe, right bronchus or lung: Secondary | ICD-10-CM | POA: Diagnosis not present

## 2019-08-03 DIAGNOSIS — K219 Gastro-esophageal reflux disease without esophagitis: Secondary | ICD-10-CM | POA: Diagnosis not present

## 2019-08-03 DIAGNOSIS — R591 Generalized enlarged lymph nodes: Secondary | ICD-10-CM | POA: Diagnosis not present

## 2019-08-03 DIAGNOSIS — Z923 Personal history of irradiation: Secondary | ICD-10-CM | POA: Diagnosis not present

## 2019-08-03 DIAGNOSIS — E785 Hyperlipidemia, unspecified: Secondary | ICD-10-CM | POA: Diagnosis not present

## 2019-08-03 DIAGNOSIS — Z7982 Long term (current) use of aspirin: Secondary | ICD-10-CM | POA: Diagnosis not present

## 2019-08-03 DIAGNOSIS — Z79899 Other long term (current) drug therapy: Secondary | ICD-10-CM | POA: Diagnosis not present

## 2019-08-03 DIAGNOSIS — Z9221 Personal history of antineoplastic chemotherapy: Secondary | ICD-10-CM | POA: Diagnosis not present

## 2019-08-03 DIAGNOSIS — I10 Essential (primary) hypertension: Secondary | ICD-10-CM | POA: Diagnosis not present

## 2019-08-03 LAB — CMP (CANCER CENTER ONLY)
ALT: 19 U/L (ref 0–44)
AST: 16 U/L (ref 15–41)
Albumin: 3.5 g/dL (ref 3.5–5.0)
Alkaline Phosphatase: 99 U/L (ref 38–126)
Anion gap: 9 (ref 5–15)
BUN: 19 mg/dL (ref 8–23)
CO2: 26 mmol/L (ref 22–32)
Calcium: 9.2 mg/dL (ref 8.9–10.3)
Chloride: 106 mmol/L (ref 98–111)
Creatinine: 0.97 mg/dL (ref 0.44–1.00)
GFR, Est AFR Am: >60 mL/min (ref >60)
GFR, Estimated: 58 mL/min — ABNORMAL LOW (ref >60)
Glucose, Bld: 122 mg/dL — ABNORMAL HIGH (ref 70–99)
Potassium: 3.7 mmol/L (ref 3.5–5.1)
Sodium: 141 mmol/L (ref 135–145)
Total Bilirubin: 0.3 mg/dL (ref 0.3–1.2)
Total Protein: 7.1 g/dL (ref 6.5–8.1)

## 2019-08-03 LAB — CBC WITH DIFFERENTIAL (CANCER CENTER ONLY)
Abs Immature Granulocytes: 0.02 10*3/uL (ref 0.00–0.07)
Basophils Absolute: 0.1 10*3/uL (ref 0.0–0.1)
Basophils Relative: 1 %
Eosinophils Absolute: 0.2 10*3/uL (ref 0.0–0.5)
Eosinophils Relative: 5 %
HCT: 40.7 % (ref 36.0–46.0)
Hemoglobin: 13.2 g/dL (ref 12.0–15.0)
Immature Granulocytes: 1 %
Lymphocytes Relative: 20 %
Lymphs Abs: 0.8 10*3/uL (ref 0.7–4.0)
MCH: 28.8 pg (ref 26.0–34.0)
MCHC: 32.4 g/dL (ref 30.0–36.0)
MCV: 88.9 fL (ref 80.0–100.0)
Monocytes Absolute: 0.5 10*3/uL (ref 0.1–1.0)
Monocytes Relative: 14 %
Neutro Abs: 2.3 10*3/uL (ref 1.7–7.7)
Neutrophils Relative %: 59 %
Platelet Count: 334 10*3/uL (ref 150–400)
RBC: 4.58 MIL/uL (ref 3.87–5.11)
RDW: 15.8 % — ABNORMAL HIGH (ref 11.5–15.5)
WBC Count: 3.8 10*3/uL — ABNORMAL LOW (ref 4.0–10.5)
nRBC: 0 % (ref 0.0–0.2)

## 2019-08-03 MED ORDER — SODIUM CHLORIDE 0.9 % IV SOLN
Freq: Once | INTRAVENOUS | Status: AC
  Filled 2019-08-03: qty 250

## 2019-08-03 MED ORDER — SODIUM CHLORIDE 0.9 % IV SOLN
10.0000 mg/kg | Freq: Once | INTRAVENOUS | Status: AC
  Administered 2019-08-03: 740 mg via INTRAVENOUS
  Filled 2019-08-03: qty 10

## 2019-08-04 ENCOUNTER — Telehealth: Payer: Self-pay | Admitting: Internal Medicine

## 2019-08-04 NOTE — Telephone Encounter (Signed)
Scheduled per los. Called and left msg. Mailed printout  °

## 2019-08-12 ENCOUNTER — Other Ambulatory Visit: Payer: Self-pay | Admitting: Family Medicine

## 2019-08-16 ENCOUNTER — Telehealth: Payer: Self-pay | Admitting: Medical Oncology

## 2019-08-16 ENCOUNTER — Ambulatory Visit: Payer: BC Managed Care – PPO | Admitting: Internal Medicine

## 2019-08-16 ENCOUNTER — Other Ambulatory Visit: Payer: BC Managed Care – PPO

## 2019-08-16 ENCOUNTER — Ambulatory Visit: Payer: BC Managed Care – PPO

## 2019-08-16 NOTE — Telephone Encounter (Signed)
Missed appts today -she thought they were tomorrow.  Schedule message sent to r/s for this week

## 2019-08-18 NOTE — Progress Notes (Signed)
[facility] OFFICE PROGRESS NOTE  [name], MD [address] - [place] [address]  DIAGNOSIS: Stage IIIB(T4, N2, M0) non-small cell lung cancer, poorly differentiated squamous cell carcinoma. She presented with large right upper lobe lung mass in addition to right hilar and mediastinal lymphadenopathy diagnosed in January 2020.  PRIOR THERAPY: Concurrent chemoradiation with weekly carboplatin for AUC of 2 and paclitaxel 45 mg/M2. First dose July 13, 2018. Status post 8 cycles. Last dose was given August 31, 2018.  CURRENT THERAPY: Consolidation treatment with immunotherapy with Imfinzi 10 mg/KG every 2 weeks. First dose starts October 13, 2018. Status post 22cycles.  INTERVAL HISTORY: Christina [name] 74 y.o. female returns to the clinic for a follow up visit. The patient is feeling well today without any concerning complaints. The patient continues to tolerate immunotherapy with imfinziwell without any adverse sideeffects. Denies any fever, chills, night sweats, or weight loss. Denies any chest pain, shortness of breath, cough, or hemoptysis. Denies any vomiting, diarrhea, or constipationexcept she reports a little bit of nausea which she associates with her GERD. She takes Prilosec for reflux. Denies any headache or visual changes. Denies any rashes or skin changes. The patient is here today for evaluation prior to starting cycle #23  MEDICAL HISTORY: Past Medical History:  Diagnosis Date  . Allergy   . Cancer ([place])    lung cancer  . GERD (gastroesophageal reflux disease)   . Hyperlipidemia   . Hypertension   . Reflux   . Tubulovillous adenoma of colon 2003    ALLERGIES:  is allergic to coconut oil; codeine; shrimp [shellfish allergy]; and trandolapril.  MEDICATIONS:  Current Outpatient Medications  Medication Sig Dispense Refill  . albuterol (VENTOLIN HFA) 108 (90 Base) MCG/ACT inhaler INHALE 2 PUFFS INTO THE LUNGS EVERY  6 HOURS AS NEEDED FOR WHEEZING OR SHORTNESS OF BREATH 6.7 g 2  . amLODipine (NORVASC) 10 MG tablet TAKE 1 TABLET(10 MG) BY MOUTH DAILY 90 tablet 3  . aspirin EC 81 MG tablet Take 81 mg by mouth daily.    Marland Kitchen atorvastatin (LIPITOR) 40 MG tablet TAKE 1 TABLET BY MOUTH DAILY 90 tablet 3  . fluticasone (FLONASE) 50 MCG/ACT nasal spray     . HYDROcodone-acetaminophen (HYCET) 7.5-325 mg/15 ml solution Take 15 mLs by mouth 4 (four) times daily as needed. (Patient not taking: Reported on 08/20/2019) 473 mL 0  . omeprazole (PRILOSEC) 40 MG capsule TAKE 1 CAPSULE(40 MG) BY MOUTH DAILY 90 capsule 3  . ondansetron (ZOFRAN-ODT) 4 MG disintegrating tablet     . potassium chloride (KLOR-CON) 10 MEQ tablet TAKE 1 TABLET(10 MEQ) BY MOUTH TWICE DAILY 60 tablet 2  . prochlorperazine (COMPAZINE) 10 MG tablet Take 1 tablet (10 mg total) by mouth every 6 (six) hours as needed for nausea or vomiting. 30 tablet 0  . sucralfate (CARAFATE) 1 g tablet Take 1 tablet (1 g total) by mouth 4 (four) times daily as needed (stomach acid). 120 tablet 6   No current facility-administered medications for this visit.    SURGICAL HISTORY:  Past Surgical History:  Procedure Laterality Date  . CESAREAN SECTION     1 time  . COLONOSCOPY    . VIDEO BRONCHOSCOPY WITH ENDOBRONCHIAL NAVIGATION N/A 06/24/2018   Procedure: VIDEO BRONCHOSCOPY WITH ENDOBRONCHIAL NAVIGATION with biopsies, right upper lung lobe;  Surgeon: Melrose Nakayama, MD;  Location: [facility] OR;  Service: Thoracic;  Laterality: N/A;  . VIDEO BRONCHOSCOPY WITH ENDOBRONCHIAL ULTRASOUND N/A 06/24/2018  Procedure: VIDEO BRONCHOSCOPY WITH ENDOBRONCHIAL ULTRASOUND, right upper lung lobe;  Surgeon: Melrose Nakayama, MD;  Location: [facility] OR;  Service: Thoracic;  Laterality: N/A;    REVIEW OF SYSTEMS:   Review of Systems  Constitutional: Negative for appetite change, chills, fatigue, fever and unexpected weight change.  HENT:   Negative for mouth sores, nosebleeds, sore throat and  trouble swallowing.   Eyes: Negative for eye problems and icterus.  Respiratory: Negative for cough, hemoptysis, shortness of breath and wheezing.  Cardiovascular: Negative for chest pain and leg swelling.  Gastrointestinal: Positive for reflux. Negative for abdominal pain, constipation, diarrhea, nausea and vomiting.  Genitourinary: Negative for bladder incontinence, difficulty urinating, dysuria, frequency and hematuria.   Musculoskeletal: Negative for back pain, gait problem, neck pain and neck stiffness.  Skin: Negative for itching and rash.  Neurological: Negative for dizziness, extremity weakness, gait problem, headaches, light-headedness and seizures.  Hematological: Negative for adenopathy. Does not bruise/bleed easily.  Psychiatric/Behavioral: Negative for confusion, depression and sleep disturbance. The patient is not nervous/anxious.     PHYSICAL EXAMINATION:  Blood pressure (!) 144/95, pulse 94, temperature 98 F (36.7 C), temperature source Temporal, resp. rate 18, height 5\' 7"  (1.702 m), weight 180 lb 9.6 oz (81.9 kg), SpO2 98 %.  ECOG PERFORMANCE STATUS: 1 - Symptomatic but completely ambulatory  Physical Exam  Constitutional: Oriented to person, place, and time and well-developed, well-nourished, and in no distress.   HENT:  Head: Normocephalic and atraumatic.  Mouth/Throat: Oropharynx is clear and moist. No oropharyngeal exudate.  Eyes: Conjunctivae are normal. Right eye exhibits no discharge. Left eye exhibits no discharge. No scleral icterus.  Neck: Normal range of motion. Neck supple.  Cardiovascular: Normal rate, regular rhythm, normal heart sounds and intact distal pulses.   Pulmonary/Chest: Effort normal and breath sounds normal. No respiratory distress. No wheezes. No rales.  Abdominal: Soft. Bowel sounds are normal. Exhibits no distension and no mass. There is no tenderness.  Musculoskeletal: Normal range of motion. Exhibits no edema.  Lymphadenopathy:    No  cervical adenopathy.  Neurological: Alert and oriented to person, place, and time. Exhibits normal muscle tone. Gait normal. Coordination normal.  Skin: Skin is warm and dry. No rash noted. Not diaphoretic. No erythema. No pallor.  Psychiatric: Mood, memory and judgment normal.  Vitals reviewed.  LABORATORY DATA: Lab Results  Component Value Date   WBC 5.2 08/20/2019   HGB 13.4 08/20/2019   HCT 41.3 08/20/2019   MCV 88.4 08/20/2019   PLT 317 08/20/2019      Chemistry      Component Value Date/Time   NA 140 08/20/2019 1000   NA 144 04/02/2017 1435   K 3.9 08/20/2019 1000   CL 106 08/20/2019 1000   CO2 27 08/20/2019 1000   BUN 14 08/20/2019 1000   BUN 11 04/02/2017 1435   CREATININE 0.88 08/20/2019 1000   CREATININE 0.87 03/22/2016 0908      Component Value Date/Time   CALCIUM 9.4 08/20/2019 1000   ALKPHOS 100 08/20/2019 1000   AST 19 08/20/2019 1000   ALT 23 08/20/2019 1000   BILITOT 0.5 08/20/2019 1000       RADIOGRAPHIC STUDIES:  No results found.   ASSESSMENT/PLAN:  This is a very pleasant 74 year old African-American female diagnosed with stage IIIb(T4, N2, M0) non-small cell lung cancer, poorly differentiated squamous cell carcinoma. She presented with a right upper lung mass in addition to right hilar and mediastinal lymphadenopathy. She was diagnosed in January 2020.  The patient  previously received concurrent chemoradiation with weekly carboplatin for an AUC of 2 and paclitaxel 45 mg/m. She is status post 8 cycles.  The patient is currently undergoing consolidation immunotherapy with Imfinzi 10 mg/kg IV every 2 weeks. She is status post22cycles of treatment. She has been tolerating it well without any adverse side effects.  Labs were reviewed. I recommend that she proceed with cycle #23today as scheduled.  We will see her back for a follow up visit in 2 weeks for evaluation before starting cycle #24.  The patient was advised to call  immediately if she has any concerning symptoms in the interval. The patient voices understanding of current disease status and treatment options and is in agreement with the current care plan. All questions were answered. The patient knows to call the clinic with any problems, questions or concerns. We can certainly see the patient much sooner if necessary   No orders of the defined types were placed in this encounter.    Cayson Kalb L Audryna Wendt, PA-C 08/20/19

## 2019-08-20 ENCOUNTER — Inpatient Hospital Stay: Payer: BC Managed Care – PPO | Attending: Internal Medicine | Admitting: Physician Assistant

## 2019-08-20 ENCOUNTER — Inpatient Hospital Stay: Payer: BC Managed Care – PPO

## 2019-08-20 ENCOUNTER — Encounter: Payer: Self-pay | Admitting: Physician Assistant

## 2019-08-20 ENCOUNTER — Other Ambulatory Visit: Payer: Self-pay

## 2019-08-20 VITALS — BP 110/68 | HR 93 | Temp 98.0°F | Resp 16

## 2019-08-20 VITALS — BP 144/95 | HR 94 | Temp 98.0°F | Resp 18 | Ht 67.0 in | Wt 180.6 lb

## 2019-08-20 DIAGNOSIS — Z5112 Encounter for antineoplastic immunotherapy: Secondary | ICD-10-CM | POA: Insufficient documentation

## 2019-08-20 DIAGNOSIS — C3491 Malignant neoplasm of unspecified part of right bronchus or lung: Secondary | ICD-10-CM

## 2019-08-20 DIAGNOSIS — Z923 Personal history of irradiation: Secondary | ICD-10-CM | POA: Diagnosis not present

## 2019-08-20 DIAGNOSIS — Z9221 Personal history of antineoplastic chemotherapy: Secondary | ICD-10-CM | POA: Diagnosis not present

## 2019-08-20 DIAGNOSIS — C3411 Malignant neoplasm of upper lobe, right bronchus or lung: Secondary | ICD-10-CM | POA: Diagnosis not present

## 2019-08-20 DIAGNOSIS — R591 Generalized enlarged lymph nodes: Secondary | ICD-10-CM | POA: Diagnosis not present

## 2019-08-20 DIAGNOSIS — Z79899 Other long term (current) drug therapy: Secondary | ICD-10-CM | POA: Insufficient documentation

## 2019-08-20 LAB — TSH: TSH: 0.745 u[IU]/mL (ref 0.308–3.960)

## 2019-08-20 LAB — CBC WITH DIFFERENTIAL (CANCER CENTER ONLY)
Abs Immature Granulocytes: 0.02 10*3/uL (ref 0.00–0.07)
Basophils Absolute: 0.1 10*3/uL (ref 0.0–0.1)
Basophils Relative: 1 %
Eosinophils Absolute: 0.2 10*3/uL (ref 0.0–0.5)
Eosinophils Relative: 3 %
HCT: 41.3 % (ref 36.0–46.0)
Hemoglobin: 13.4 g/dL (ref 12.0–15.0)
Immature Granulocytes: 0 %
Lymphocytes Relative: 20 %
Lymphs Abs: 1 10*3/uL (ref 0.7–4.0)
MCH: 28.7 pg (ref 26.0–34.0)
MCHC: 32.4 g/dL (ref 30.0–36.0)
MCV: 88.4 fL (ref 80.0–100.0)
Monocytes Absolute: 0.5 10*3/uL (ref 0.1–1.0)
Monocytes Relative: 9 %
Neutro Abs: 3.5 10*3/uL (ref 1.7–7.7)
Neutrophils Relative %: 67 %
Platelet Count: 317 10*3/uL (ref 150–400)
RBC: 4.67 MIL/uL (ref 3.87–5.11)
RDW: 15.9 % — ABNORMAL HIGH (ref 11.5–15.5)
WBC Count: 5.2 10*3/uL (ref 4.0–10.5)
nRBC: 0 % (ref 0.0–0.2)

## 2019-08-20 LAB — CMP (CANCER CENTER ONLY)
ALT: 23 U/L (ref 0–44)
AST: 19 U/L (ref 15–41)
Albumin: 3.7 g/dL (ref 3.5–5.0)
Alkaline Phosphatase: 100 U/L (ref 38–126)
Anion gap: 7 (ref 5–15)
BUN: 14 mg/dL (ref 8–23)
CO2: 27 mmol/L (ref 22–32)
Calcium: 9.4 mg/dL (ref 8.9–10.3)
Chloride: 106 mmol/L (ref 98–111)
Creatinine: 0.88 mg/dL (ref 0.44–1.00)
GFR, Est AFR Am: >60 mL/min (ref >60)
GFR, Estimated: >60 mL/min (ref >60)
Glucose, Bld: 93 mg/dL (ref 70–99)
Potassium: 3.9 mmol/L (ref 3.5–5.1)
Sodium: 140 mmol/L (ref 135–145)
Total Bilirubin: 0.5 mg/dL (ref 0.3–1.2)
Total Protein: 7.4 g/dL (ref 6.5–8.1)

## 2019-08-20 MED ORDER — SODIUM CHLORIDE 0.9 % IV SOLN
Freq: Once | INTRAVENOUS | Status: AC
  Filled 2019-08-20: qty 250

## 2019-08-20 MED ORDER — SODIUM CHLORIDE 0.9 % IV SOLN
10.0000 mg/kg | Freq: Once | INTRAVENOUS | Status: AC
  Administered 2019-08-20: 740 mg via INTRAVENOUS
  Filled 2019-08-20: qty 4.8

## 2019-08-20 NOTE — Patient Instructions (Signed)
[facility] Discharge Instructions for Patients Receiving Chemotherapy  Today you received the following chemotherapy agents: imfinzi  To help prevent nausea and vomiting after your treatment, we encourage you to take your nausea medication as directed.   If you develop nausea and vomiting that is not controlled by your nausea medication, call the clinic.   BELOW ARE SYMPTOMS THAT SHOULD BE REPORTED IMMEDIATELY:  *FEVER GREATER THAN 100.5 F  *CHILLS WITH OR WITHOUT FEVER  NAUSEA AND VOMITING THAT IS NOT CONTROLLED WITH YOUR NAUSEA MEDICATION  *UNUSUAL SHORTNESS OF BREATH  *UNUSUAL BRUISING OR BLEEDING  TENDERNESS IN MOUTH AND THROAT WITH OR WITHOUT PRESENCE OF ULCERS  *URINARY PROBLEMS  *BOWEL PROBLEMS  UNUSUAL RASH Items with * indicate a potential emergency and should be followed up as soon as possible.  Feel free to call the clinic should you have any questions or concerns. The clinic phone number is [phone].  Please show the [facility] at check-in to the Emergency Department and triage nurse.

## 2019-08-23 ENCOUNTER — Telehealth: Payer: Self-pay | Admitting: Physician Assistant

## 2019-08-23 ENCOUNTER — Ambulatory Visit: Payer: BC Managed Care – PPO | Attending: Internal Medicine

## 2019-08-23 DIAGNOSIS — Z23 Encounter for immunization: Secondary | ICD-10-CM | POA: Insufficient documentation

## 2019-08-23 NOTE — Telephone Encounter (Signed)
Checked pt out. Did not need to make changes to appts as she was already schedule for next 3 treatments.

## 2019-08-23 NOTE — Progress Notes (Signed)
   Covid-19 Vaccination Clinic  Name:  Christina [name]    MRN: [id number] DOB: 05/24/1946  08/23/2019  [name] was observed post Covid-19 immunization for 15 minutes without incident. She was provided with Vaccine Information Sheet and instruction to access the V-Safe system.   Ms. Peer was instructed to call 911 with any severe reactions post vaccine: Marland Kitchen Difficulty breathing  . Swelling of face and throat  . A fast heartbeat  . A bad rash all over body  . Dizziness and weakness   Immunizations Administered    Name Date Dose VIS Date Route   Pfizer COVID-19 Vaccine 08/23/2019 10:23 AM 0.3 mL 05/28/2019 Intramuscular   Manufacturer: [place]   Lot: [id number]   [place]: [id number]

## 2019-08-31 ENCOUNTER — Other Ambulatory Visit: Payer: Self-pay

## 2019-08-31 ENCOUNTER — Inpatient Hospital Stay: Payer: BC Managed Care – PPO

## 2019-08-31 ENCOUNTER — Inpatient Hospital Stay (HOSPITAL_BASED_OUTPATIENT_CLINIC_OR_DEPARTMENT_OTHER): Payer: BC Managed Care – PPO | Admitting: Internal Medicine

## 2019-08-31 ENCOUNTER — Encounter: Payer: Self-pay | Admitting: Internal Medicine

## 2019-08-31 VITALS — BP 138/90 | HR 86 | Temp 98.0°F | Resp 18 | Ht 67.0 in | Wt 181.8 lb

## 2019-08-31 DIAGNOSIS — C3411 Malignant neoplasm of upper lobe, right bronchus or lung: Secondary | ICD-10-CM | POA: Diagnosis not present

## 2019-08-31 DIAGNOSIS — R591 Generalized enlarged lymph nodes: Secondary | ICD-10-CM | POA: Diagnosis not present

## 2019-08-31 DIAGNOSIS — C3491 Malignant neoplasm of unspecified part of right bronchus or lung: Secondary | ICD-10-CM

## 2019-08-31 DIAGNOSIS — Z9221 Personal history of antineoplastic chemotherapy: Secondary | ICD-10-CM | POA: Diagnosis not present

## 2019-08-31 DIAGNOSIS — Z5112 Encounter for antineoplastic immunotherapy: Secondary | ICD-10-CM | POA: Diagnosis not present

## 2019-08-31 DIAGNOSIS — Z923 Personal history of irradiation: Secondary | ICD-10-CM | POA: Diagnosis not present

## 2019-08-31 DIAGNOSIS — Z79899 Other long term (current) drug therapy: Secondary | ICD-10-CM | POA: Diagnosis not present

## 2019-08-31 LAB — CBC WITH DIFFERENTIAL (CANCER CENTER ONLY)
Abs Immature Granulocytes: 0.03 10*3/uL (ref 0.00–0.07)
Basophils Absolute: 0.1 10*3/uL (ref 0.0–0.1)
Basophils Relative: 2 %
Eosinophils Absolute: 0.2 10*3/uL (ref 0.0–0.5)
Eosinophils Relative: 3 %
HCT: 39.6 % (ref 36.0–46.0)
Hemoglobin: 12.9 g/dL (ref 12.0–15.0)
Immature Granulocytes: 1 %
Lymphocytes Relative: 21 %
Lymphs Abs: 1.1 10*3/uL (ref 0.7–4.0)
MCH: 29.1 pg (ref 26.0–34.0)
MCHC: 32.6 g/dL (ref 30.0–36.0)
MCV: 89.2 fL (ref 80.0–100.0)
Monocytes Absolute: 0.5 10*3/uL (ref 0.1–1.0)
Monocytes Relative: 9 %
Neutro Abs: 3.4 10*3/uL (ref 1.7–7.7)
Neutrophils Relative %: 64 %
Platelet Count: 341 10*3/uL (ref 150–400)
RBC: 4.44 MIL/uL (ref 3.87–5.11)
RDW: 15.9 % — ABNORMAL HIGH (ref 11.5–15.5)
WBC Count: 5.3 10*3/uL (ref 4.0–10.5)
nRBC: 0 % (ref 0.0–0.2)

## 2019-08-31 LAB — CMP (CANCER CENTER ONLY)
ALT: 17 U/L (ref 0–44)
AST: 16 U/L (ref 15–41)
Albumin: 3.5 g/dL (ref 3.5–5.0)
Alkaline Phosphatase: 94 U/L (ref 38–126)
Anion gap: 9 (ref 5–15)
BUN: 16 mg/dL (ref 8–23)
CO2: 26 mmol/L (ref 22–32)
Calcium: 9.1 mg/dL (ref 8.9–10.3)
Chloride: 106 mmol/L (ref 98–111)
Creatinine: 0.83 mg/dL (ref 0.44–1.00)
GFR, Est AFR Am: >60 mL/min (ref >60)
GFR, Estimated: >60 mL/min (ref >60)
Glucose, Bld: 92 mg/dL (ref 70–99)
Potassium: 3.9 mmol/L (ref 3.5–5.1)
Sodium: 141 mmol/L (ref 135–145)
Total Bilirubin: 0.4 mg/dL (ref 0.3–1.2)
Total Protein: 7 g/dL (ref 6.5–8.1)

## 2019-08-31 MED ORDER — SODIUM CHLORIDE 0.9 % IV SOLN
Freq: Once | INTRAVENOUS | Status: AC
  Filled 2019-08-31: qty 250

## 2019-08-31 MED ORDER — SODIUM CHLORIDE 0.9 % IV SOLN
10.0000 mg/kg | Freq: Once | INTRAVENOUS | Status: AC
  Administered 2019-08-31: 740 mg via INTRAVENOUS
  Filled 2019-08-31: qty 4.8

## 2019-08-31 NOTE — Patient Instructions (Signed)
[facility] Discharge Instructions for Patients Receiving Chemotherapy  Today you received the following chemotherapy agents: imfinzi  To help prevent nausea and vomiting after your treatment, we encourage you to take your nausea medication as directed.   If you develop nausea and vomiting that is not controlled by your nausea medication, call the clinic.   BELOW ARE SYMPTOMS THAT SHOULD BE REPORTED IMMEDIATELY:  *FEVER GREATER THAN 100.5 F  *CHILLS WITH OR WITHOUT FEVER  NAUSEA AND VOMITING THAT IS NOT CONTROLLED WITH YOUR NAUSEA MEDICATION  *UNUSUAL SHORTNESS OF BREATH  *UNUSUAL BRUISING OR BLEEDING  TENDERNESS IN MOUTH AND THROAT WITH OR WITHOUT PRESENCE OF ULCERS  *URINARY PROBLEMS  *BOWEL PROBLEMS  UNUSUAL RASH Items with * indicate a potential emergency and should be followed up as soon as possible.  Feel free to call the clinic should you have any questions or concerns. The clinic phone number is [phone].  Please show the [facility] at check-in to the Emergency Department and triage nurse.

## 2019-08-31 NOTE — Progress Notes (Signed)
[place] [phone]   [phone]  OFFICE PROGRESS NOTE  [name], MD [address] - [place] [address]  DIAGNOSIS: Stage IIIB (T4, N2, M0) non-small cell lung cancer, poorly differentiated squamous cell carcinoma.  She presented with large right upper lobe lung mass in addition to right hilar and mediastinal lymphadenopathy diagnosed in January 2020.  PRIOR THERAPY: Concurrent chemoradiation with weekly carboplatin for AUC of 2 and paclitaxel 45 mg/M2.  First dose July 13, 2018.  Status post 8 cycles.  Last dose was given August 31, 2018.  CURRENT THERAPY: Consolidation treatment with immunotherapy with Imfinzi 10 mg/KG every 2 weeks.  First dose starts October 13, 2018.  Status post 23 cycles.  INTERVAL HISTORY: Christina [name] 74 y.o. female returns to the clinic today for follow-up visit.  The patient is feeling fine today with no concerning complaints.  She denied having any chest pain, shortness of breath, cough or hemoptysis.  She denied having any fever or chills.  She has no nausea, vomiting, diarrhea or constipation.  She denied having any headache or visual changes.  She is here today for evaluation before starting cycle #24.  MEDICAL HISTORY: Past Medical History:  Diagnosis Date  . Allergy   . Cancer ([place])    lung cancer  . GERD (gastroesophageal reflux disease)   . Hyperlipidemia   . Hypertension   . Reflux   . Tubulovillous adenoma of colon 2003    ALLERGIES:  is allergic to coconut oil; codeine; shrimp [shellfish allergy]; and trandolapril.  MEDICATIONS:  Current Outpatient Medications  Medication Sig Dispense Refill  . albuterol (VENTOLIN HFA) 108 (90 Base) MCG/ACT inhaler INHALE 2 PUFFS INTO THE LUNGS EVERY 6 HOURS AS NEEDED FOR WHEEZING OR SHORTNESS OF BREATH 6.7 g 2  . amLODipine (NORVASC) 10 MG tablet TAKE 1 TABLET(10 MG) BY MOUTH DAILY 90 tablet 3  . aspirin EC 81 MG tablet  Take 81 mg by mouth daily.    Marland Kitchen atorvastatin (LIPITOR) 40 MG tablet TAKE 1 TABLET BY MOUTH DAILY 90 tablet 3  . fluticasone (FLONASE) 50 MCG/ACT nasal spray     . omeprazole (PRILOSEC) 40 MG capsule TAKE 1 CAPSULE(40 MG) BY MOUTH DAILY 90 capsule 3  . ondansetron (ZOFRAN-ODT) 4 MG disintegrating tablet     . potassium chloride (KLOR-CON) 10 MEQ tablet TAKE 1 TABLET(10 MEQ) BY MOUTH TWICE DAILY 60 tablet 2  . prochlorperazine (COMPAZINE) 10 MG tablet Take 1 tablet (10 mg total) by mouth every 6 (six) hours as needed for nausea or vomiting. 30 tablet 0  . sucralfate (CARAFATE) 1 g tablet Take 1 tablet (1 g total) by mouth 4 (four) times daily as needed (stomach acid). 120 tablet 6  . HYDROcodone-acetaminophen (HYCET) 7.5-325 mg/15 ml solution Take 15 mLs by mouth 4 (four) times daily as needed. (Patient not taking: Reported on 08/20/2019) 473 mL 0   No current facility-administered medications for this visit.    SURGICAL HISTORY:  Past Surgical History:  Procedure Laterality Date  . CESAREAN SECTION     1 time  . COLONOSCOPY    . VIDEO BRONCHOSCOPY WITH ENDOBRONCHIAL NAVIGATION N/A 06/24/2018   Procedure: VIDEO BRONCHOSCOPY WITH ENDOBRONCHIAL NAVIGATION with biopsies, right upper lung lobe;  Surgeon: Melrose Nakayama, MD;  Location: [facility] OR;  Service: Thoracic;  Laterality: N/A;  . VIDEO BRONCHOSCOPY WITH ENDOBRONCHIAL ULTRASOUND N/A 06/24/2018   Procedure: VIDEO BRONCHOSCOPY WITH ENDOBRONCHIAL ULTRASOUND, right upper  lung lobe;  Surgeon: Melrose Nakayama, MD;  Location: [facility] OR;  Service: Thoracic;  Laterality: N/A;    REVIEW OF SYSTEMS:  A comprehensive review of systems was negative.   PHYSICAL EXAMINATION: General appearance: alert, cooperative and no distress Head: Normocephalic, without obvious abnormality, atraumatic Neck: no adenopathy, no JVD, supple, symmetrical, trachea midline and thyroid not enlarged, symmetric, no tenderness/mass/nodules Lymph nodes: Cervical,  supraclavicular, and axillary nodes normal. Resp: clear to auscultation bilaterally Back: symmetric, no curvature. ROM normal. No CVA tenderness. Cardio: regular rate and rhythm, S1, S2 normal, no murmur, click, rub or gallop GI: soft, non-tender; bowel sounds normal; no masses,  no organomegaly Extremities: extremities normal, atraumatic, no cyanosis or edema  ECOG PERFORMANCE STATUS: 0 - Asymptomatic  Blood pressure 138/90, pulse 86, temperature 98 F (36.7 C), temperature source Oral, resp. rate 18, height 5\' 7"  (1.702 m), weight 181 lb 12.8 oz (82.5 kg), SpO2 100 %.  LABORATORY DATA: Lab Results  Component Value Date   WBC 5.3 08/31/2019   HGB 12.9 08/31/2019   HCT 39.6 08/31/2019   MCV 89.2 08/31/2019   PLT 341 08/31/2019      Chemistry      Component Value Date/Time   NA 141 08/31/2019 1139   NA 144 04/02/2017 1435   K 3.9 08/31/2019 1139   CL 106 08/31/2019 1139   CO2 26 08/31/2019 1139   BUN 16 08/31/2019 1139   BUN 11 04/02/2017 1435   CREATININE 0.83 08/31/2019 1139   CREATININE 0.87 03/22/2016 0908      Component Value Date/Time   CALCIUM 9.1 08/31/2019 1139   ALKPHOS 94 08/31/2019 1139   AST 16 08/31/2019 1139   ALT 17 08/31/2019 1139   BILITOT 0.4 08/31/2019 1139       RADIOGRAPHIC STUDIES: No results found.  ASSESSMENT AND PLAN: This is a very pleasant 74 years old African-American female recently diagnosed with a stage IIIB (T4, N2, M0) non-small cell lung cancer, poorly differentiated squamous cell carcinoma presented with right upper lobe lung mass in addition to right hilar and mediastinal lymphadenopathy. The patient underwent concurrent chemoradiation with weekly carboplatin and paclitaxel status post 8 cycles. She has partial response to this treatment. The patient was started on consolidation treatment with immunotherapy with Imfinzi 10 mg/KG every 2 weeks status post 23 cycles.   The patient continues to tolerate this treatment well with no  concerning adverse effects. I recommended for her to proceed with cycle #24 today as planned. She will come back for follow-up visit in 2 weeks for evaluation before the next cycle of her treatment. She was advised to call immediately if she has any other concerning symptoms in the interval. The patient voices understanding of current disease status and treatment options and is in agreement with the current care plan.  All questions were answered. The patient knows to call the clinic with any problems, questions or concerns. We can certainly see the patient much sooner if necessary.  Disclaimer: This note was dictated with voice recognition software. Similar sounding words can inadvertently be transcribed and may not be corrected upon review.

## 2019-09-07 NOTE — Progress Notes (Signed)
Pharmacist Chemotherapy Monitoring - Follow Up Assessment    I verify that I have reviewed each item in the below checklist:  . Regimen for the patient is scheduled for the appropriate day and plan matches scheduled date. Marland Kitchen Appropriate non-routine labs are ordered dependent on drug ordered. . If applicable, additional medications reviewed and ordered per protocol based on lifetime cumulative doses and/or treatment regimen.   Plan for follow-up and/or issues identified: No . I-vent associated with next due treatment: No . MD and/or nursing notified: No  Christina [name] 09/07/2019 12:18 PM

## 2019-09-13 ENCOUNTER — Inpatient Hospital Stay: Payer: BC Managed Care – PPO

## 2019-09-13 ENCOUNTER — Other Ambulatory Visit: Payer: Self-pay | Admitting: Family Medicine

## 2019-09-13 ENCOUNTER — Inpatient Hospital Stay: Payer: BC Managed Care – PPO | Admitting: Internal Medicine

## 2019-09-13 ENCOUNTER — Other Ambulatory Visit: Payer: Self-pay

## 2019-09-13 ENCOUNTER — Encounter: Payer: Self-pay | Admitting: Internal Medicine

## 2019-09-13 VITALS — BP 149/90 | HR 100 | Temp 98.2°F | Resp 20 | Ht 67.0 in | Wt 179.3 lb

## 2019-09-13 DIAGNOSIS — R591 Generalized enlarged lymph nodes: Secondary | ICD-10-CM | POA: Diagnosis not present

## 2019-09-13 DIAGNOSIS — Z79899 Other long term (current) drug therapy: Secondary | ICD-10-CM | POA: Diagnosis not present

## 2019-09-13 DIAGNOSIS — Z5112 Encounter for antineoplastic immunotherapy: Secondary | ICD-10-CM

## 2019-09-13 DIAGNOSIS — C3491 Malignant neoplasm of unspecified part of right bronchus or lung: Secondary | ICD-10-CM

## 2019-09-13 DIAGNOSIS — Z923 Personal history of irradiation: Secondary | ICD-10-CM | POA: Diagnosis not present

## 2019-09-13 DIAGNOSIS — I10 Essential (primary) hypertension: Secondary | ICD-10-CM

## 2019-09-13 DIAGNOSIS — C3411 Malignant neoplasm of upper lobe, right bronchus or lung: Secondary | ICD-10-CM | POA: Diagnosis not present

## 2019-09-13 DIAGNOSIS — Z9221 Personal history of antineoplastic chemotherapy: Secondary | ICD-10-CM | POA: Diagnosis not present

## 2019-09-13 DIAGNOSIS — R062 Wheezing: Secondary | ICD-10-CM

## 2019-09-13 LAB — CMP (CANCER CENTER ONLY)
ALT: 14 U/L (ref 0–44)
AST: 17 U/L (ref 15–41)
Albumin: 3.5 g/dL (ref 3.5–5.0)
Alkaline Phosphatase: 98 U/L (ref 38–126)
Anion gap: 11 (ref 5–15)
BUN: 14 mg/dL (ref 8–23)
CO2: 25 mmol/L (ref 22–32)
Calcium: 9.7 mg/dL (ref 8.9–10.3)
Chloride: 106 mmol/L (ref 98–111)
Creatinine: 0.96 mg/dL (ref 0.44–1.00)
GFR, Est AFR Am: >60 mL/min (ref >60)
GFR, Estimated: 59 mL/min — ABNORMAL LOW (ref >60)
Glucose, Bld: 116 mg/dL — ABNORMAL HIGH (ref 70–99)
Potassium: 3.5 mmol/L (ref 3.5–5.1)
Sodium: 142 mmol/L (ref 135–145)
Total Bilirubin: 0.5 mg/dL (ref 0.3–1.2)
Total Protein: 7.4 g/dL (ref 6.5–8.1)

## 2019-09-13 LAB — CBC WITH DIFFERENTIAL (CANCER CENTER ONLY)
Abs Immature Granulocytes: 0.02 10*3/uL (ref 0.00–0.07)
Basophils Absolute: 0.1 10*3/uL (ref 0.0–0.1)
Basophils Relative: 1 %
Eosinophils Absolute: 0.2 10*3/uL (ref 0.0–0.5)
Eosinophils Relative: 5 %
HCT: 39.7 % (ref 36.0–46.0)
Hemoglobin: 13 g/dL (ref 12.0–15.0)
Immature Granulocytes: 0 %
Lymphocytes Relative: 23 %
Lymphs Abs: 1.1 10*3/uL (ref 0.7–4.0)
MCH: 28.6 pg (ref 26.0–34.0)
MCHC: 32.7 g/dL (ref 30.0–36.0)
MCV: 87.3 fL (ref 80.0–100.0)
Monocytes Absolute: 0.4 10*3/uL (ref 0.1–1.0)
Monocytes Relative: 9 %
Neutro Abs: 3.1 10*3/uL (ref 1.7–7.7)
Neutrophils Relative %: 62 %
Platelet Count: 302 10*3/uL (ref 150–400)
RBC: 4.55 MIL/uL (ref 3.87–5.11)
RDW: 15.8 % — ABNORMAL HIGH (ref 11.5–15.5)
WBC Count: 4.9 10*3/uL (ref 4.0–10.5)
nRBC: 0 % (ref 0.0–0.2)

## 2019-09-13 LAB — TSH: TSH: 1.234 u[IU]/mL (ref 0.308–3.960)

## 2019-09-13 MED ORDER — SODIUM CHLORIDE 0.9 % IV SOLN
Freq: Once | INTRAVENOUS | Status: AC
  Filled 2019-09-13: qty 250

## 2019-09-13 MED ORDER — SODIUM CHLORIDE 0.9 % IV SOLN
10.0000 mg/kg | Freq: Once | INTRAVENOUS | Status: AC
  Administered 2019-09-13: 740 mg via INTRAVENOUS
  Filled 2019-09-13: qty 10

## 2019-09-13 MED ORDER — SODIUM CHLORIDE 0.9% FLUSH
10.0000 mL | INTRAVENOUS | Status: DC | PRN
  Filled 2019-09-13: qty 10

## 2019-09-13 MED ORDER — HEPARIN SOD (PORK) LOCK FLUSH 100 UNIT/ML IV SOLN
500.0000 [IU] | Freq: Once | INTRAVENOUS | Status: DC | PRN
  Filled 2019-09-13: qty 5

## 2019-09-13 NOTE — Progress Notes (Signed)
[place] [phone]   Fax:(336) [phone]  OFFICE PROGRESS NOTE  [name], MD [address] - [place] [address]  DIAGNOSIS: Stage IIIB (T4, N2, M0) non-small cell lung cancer, poorly differentiated squamous cell carcinoma.  She presented with large right upper lobe lung mass in addition to right hilar and mediastinal lymphadenopathy diagnosed in January 2020.  PRIOR THERAPY: Concurrent chemoradiation with weekly carboplatin for AUC of 2 and paclitaxel 45 mg/M2.  First dose July 13, 2018.  Status post 8 cycles.  Last dose was given August 31, 2018.  CURRENT THERAPY: Consolidation treatment with immunotherapy with Imfinzi 10 mg/KG every 2 weeks.  First dose starts October 13, 2018.  Status post 24 cycles.  INTERVAL HISTORY: Christina [name] 74 y.o. female returns to the clinic today for follow-up visit.  The patient is feeling fine today with no concerning complaints.  She denied having any current chest pain, shortness of breath, cough or hemoptysis.  She denied having any fever or chills.  She has no nausea, vomiting, diarrhea or constipation.  She denied having any headache or visual changes.  She continues to tolerate her consolidation immunotherapy fairly well.  She is here for evaluation before starting cycle #25.  MEDICAL HISTORY: Past Medical History:  Diagnosis Date  . Allergy   . Cancer ([place])    lung cancer  . GERD (gastroesophageal reflux disease)   . Hyperlipidemia   . Hypertension   . Reflux   . Tubulovillous adenoma of colon 2003    ALLERGIES:  is allergic to coconut oil; codeine; shrimp [shellfish allergy]; and trandolapril.  MEDICATIONS:  Current Outpatient Medications  Medication Sig Dispense Refill  . albuterol (VENTOLIN HFA) 108 (90 Base) MCG/ACT inhaler INHALE 2 PUFFS INTO THE LUNGS EVERY 6 HOURS AS NEEDED FOR WHEEZING OR SHORTNESS OF BREATH 6.7 g 2  . amLODipine (NORVASC) 10 MG tablet TAKE  1 TABLET(10 MG) BY MOUTH DAILY 90 tablet 3  . aspirin EC 81 MG tablet Take 81 mg by mouth daily.    Marland Kitchen atorvastatin (LIPITOR) 40 MG tablet TAKE 1 TABLET BY MOUTH DAILY 90 tablet 3  . fluticasone (FLONASE) 50 MCG/ACT nasal spray     . HYDROcodone-acetaminophen (HYCET) 7.5-325 mg/15 ml solution Take 15 mLs by mouth 4 (four) times daily as needed. (Patient not taking: Reported on 08/20/2019) 473 mL 0  . omeprazole (PRILOSEC) 40 MG capsule TAKE 1 CAPSULE(40 MG) BY MOUTH DAILY 90 capsule 3  . ondansetron (ZOFRAN-ODT) 4 MG disintegrating tablet     . potassium chloride (KLOR-CON) 10 MEQ tablet TAKE 1 TABLET(10 MEQ) BY MOUTH TWICE DAILY 60 tablet 2  . prochlorperazine (COMPAZINE) 10 MG tablet Take 1 tablet (10 mg total) by mouth every 6 (six) hours as needed for nausea or vomiting. 30 tablet 0  . sucralfate (CARAFATE) 1 g tablet Take 1 tablet (1 g total) by mouth 4 (four) times daily as needed (stomach acid). 120 tablet 6   No current facility-administered medications for this visit.    SURGICAL HISTORY:  Past Surgical History:  Procedure Laterality Date  . CESAREAN SECTION     1 time  . COLONOSCOPY    . VIDEO BRONCHOSCOPY WITH ENDOBRONCHIAL NAVIGATION N/A 06/24/2018   Procedure: VIDEO BRONCHOSCOPY WITH ENDOBRONCHIAL NAVIGATION with biopsies, right upper lung lobe;  Surgeon: Melrose Nakayama, MD;  Location: [facility] OR;  Service: Thoracic;  Laterality: N/A;  . VIDEO BRONCHOSCOPY WITH ENDOBRONCHIAL ULTRASOUND N/A 06/24/2018  Procedure: VIDEO BRONCHOSCOPY WITH ENDOBRONCHIAL ULTRASOUND, right upper lung lobe;  Surgeon: Melrose Nakayama, MD;  Location: [facility] OR;  Service: Thoracic;  Laterality: N/A;    REVIEW OF SYSTEMS:  A comprehensive review of systems was negative.   PHYSICAL EXAMINATION: General appearance: alert, cooperative and no distress Head: Normocephalic, without obvious abnormality, atraumatic Neck: no adenopathy, no JVD, supple, symmetrical, trachea midline and thyroid not enlarged,  symmetric, no tenderness/mass/nodules Lymph nodes: Cervical, supraclavicular, and axillary nodes normal. Resp: clear to auscultation bilaterally Back: symmetric, no curvature. ROM normal. No CVA tenderness. Cardio: regular rate and rhythm, S1, S2 normal, no murmur, click, rub or gallop GI: soft, non-tender; bowel sounds normal; no masses,  no organomegaly Extremities: extremities normal, atraumatic, no cyanosis or edema  ECOG PERFORMANCE STATUS: 0 - Asymptomatic  Blood pressure (!) 149/90, pulse 100, temperature 98.2 F (36.8 C), temperature source Oral, resp. rate 20, height 5\' 7"  (1.702 m), weight 179 lb 4.8 oz (81.3 kg), SpO2 98 %.  LABORATORY DATA: Lab Results  Component Value Date   WBC 4.9 09/13/2019   HGB 13.0 09/13/2019   HCT 39.7 09/13/2019   MCV 87.3 09/13/2019   PLT 302 09/13/2019      Chemistry      Component Value Date/Time   NA 141 08/31/2019 1139   NA 144 04/02/2017 1435   K 3.9 08/31/2019 1139   CL 106 08/31/2019 1139   CO2 26 08/31/2019 1139   BUN 16 08/31/2019 1139   BUN 11 04/02/2017 1435   CREATININE 0.83 08/31/2019 1139   CREATININE 0.87 03/22/2016 0908      Component Value Date/Time   CALCIUM 9.1 08/31/2019 1139   ALKPHOS 94 08/31/2019 1139   AST 16 08/31/2019 1139   ALT 17 08/31/2019 1139   BILITOT 0.4 08/31/2019 1139       RADIOGRAPHIC STUDIES: No results found.  ASSESSMENT AND PLAN: This is a very pleasant 74 years old African-American female recently diagnosed with a stage IIIB (T4, N2, M0) non-small cell lung cancer, poorly differentiated squamous cell carcinoma presented with right upper lobe lung mass in addition to right hilar and mediastinal lymphadenopathy. The patient underwent concurrent chemoradiation with weekly carboplatin and paclitaxel status post 8 cycles. She has partial response to this treatment. The patient was started on consolidation treatment with immunotherapy with Imfinzi 10 mg/KG every 2 weeks status post 24 cycles.    She tolerated the the last cycle of her treatment well with no concerning adverse effects. I recommended for her to proceed with cycle #25 today as planned. I will see her back for follow-up visit in 2 weeks for evaluation before the last cycle of her consolidation immunotherapy. For hypertension she was advised to take her blood pressure medications as prescribed and to monitor it closely at home. The patient was advised to call immediately if she has any other concerning symptoms in the interval. The patient voices understanding of current disease status and treatment options and is in agreement with the current care plan.  All questions were answered. The patient knows to call the clinic with any problems, questions or concerns. We can certainly see the patient much sooner if necessary.  Disclaimer: This note was dictated with voice recognition software. Similar sounding words can inadvertently be transcribed and may not be corrected upon review.

## 2019-09-13 NOTE — Patient Instructions (Signed)
Durvalumab injection What is this medicine? DURVALUMAB (dur VAL ue mab) is a monoclonal antibody. It is used to treat urothelial cancer and lung cancer. This medicine may be used for other purposes; ask your health care provider or pharmacist if you have questions. COMMON BRAND NAME(S): IMFINZI What should I tell my health care provider before I take this medicine? They need to know if you have any of these conditions:  diabetes  immune system problems  infection  inflammatory bowel disease  kidney disease  liver disease  lung or breathing disease  lupus  organ transplant  stomach or intestine problems  thyroid disease  an unusual or allergic reaction to durvalumab, other medicines, foods, dyes, or preservatives  pregnant or trying to get pregnant  breast-feeding How should I use this medicine? This medicine is for infusion into a vein. It is given by a health care professional in a hospital or clinic setting. A special MedGuide will be given to you before each treatment. Be sure to read this information carefully each time. Talk to your pediatrician regarding the use of this medicine in children. Special care may be needed. Overdosage: If you think you have taken too much of this medicine contact a poison control center or emergency room at once. NOTE: This medicine is only for you. Do not share this medicine with others. What if I miss a dose? It is important not to miss your dose. Call your doctor or health care professional if you are unable to keep an appointment. What may interact with this medicine? Interactions have not been studied. This list may not describe all possible interactions. Give your health care provider a list of all the medicines, herbs, non-prescription drugs, or dietary supplements you use. Also tell them if you smoke, drink alcohol, or use illegal drugs. Some items may interact with your medicine. What should I watch for while using this  medicine? This drug may make you feel generally unwell. Continue your course of treatment even though you feel ill unless your doctor tells you to stop. You may need blood work done while you are taking this medicine. Do not become pregnant while taking this medicine or for 3 months after stopping it. Women should inform their doctor if they wish to become pregnant or think they might be pregnant. There is a potential for serious side effects to an unborn child. Talk to your health care professional or pharmacist for more information. Do not breast-feed an infant while taking this medicine or for 3 months after stopping it. What side effects may I notice from receiving this medicine? Side effects that you should report to your doctor or health care professional as soon as possible:  allergic reactions like skin rash, itching or hives, swelling of the face, lips, or tongue  black, tarry stools  bloody or watery diarrhea  breathing problems  change in emotions or moods  change in sex drive  changes in vision  chest pain or chest tightness  chills  confusion  cough  facial flushing  fever  headache  signs and symptoms of high blood sugar such as dizziness; dry mouth; dry skin; fruity breath; nausea; stomach pain; increased hunger or thirst; increased urination  signs and symptoms of liver injury like dark yellow or brown urine; general ill feeling or flu-like symptoms; light-colored stools; loss of appetite; nausea; right upper belly pain; unusually weak or tired; yellowing of the eyes or skin  stomach pain  trouble passing urine or change in  the amount of urine  weight gain or weight loss Side effects that usually do not require medical attention (report these to your doctor or health care professional if they continue or are bothersome):  bone pain  constipation  loss of appetite  muscle pain  nausea  swelling of the ankles, feet, hands  tiredness This list  may not describe all possible side effects. Call your doctor for medical advice about side effects. You may report side effects to FDA at 1-800-FDA-1088. Where should I keep my medicine? This drug is given in a hospital or clinic and will not be stored at home. NOTE: This sheet is a summary. It may not cover all possible information. If you have questions about this medicine, talk to your doctor, pharmacist, or health care provider.  2020 Elsevier/Gold Standard (2016-08-13 19:25:04)

## 2019-09-15 ENCOUNTER — Other Ambulatory Visit: Payer: Self-pay | Admitting: Family Medicine

## 2019-09-15 DIAGNOSIS — K219 Gastro-esophageal reflux disease without esophagitis: Secondary | ICD-10-CM

## 2019-09-15 NOTE — Telephone Encounter (Signed)
Requested Prescriptions  Pending Prescriptions Disp Refills  . omeprazole (PRILOSEC) 40 MG capsule [Pharmacy Med Name: OMEPRAZOLE 40MG  CAPSULES] 90 capsule 2    Sig: TAKE 1 CAPSULE(40 MG) BY MOUTH DAILY     Gastroenterology: Proton Pump Inhibitors Passed - 09/15/2019  3:59 AM      Passed - Valid encounter within last 12 months    Recent Outpatient Visits          3 months ago Essential hypertension   [facility] at [facility], Arlie Solomons, MD   9 months ago Essential hypertension   [facility] at [facility], [place], MD   1 year ago Acute upper respiratory infection   [facility] at [facility], Fenton Malling, MD   1 year ago Acute URI   Primary Care at [facility], Arlie Solomons, MD   1 year ago Essential hypertension   Primary [facility] at [place], MD      Future Appointments            In 2 months [name], MD Primary [facility] at [place], [facility]

## 2019-09-21 NOTE — Progress Notes (Signed)
Pharmacist Chemotherapy Monitoring - Follow Up Assessment    I verify that I have reviewed each item in the below checklist:  . Regimen for the patient is scheduled for the appropriate day and plan matches scheduled date. Marland Kitchen Appropriate non-routine labs are ordered dependent on drug ordered. . If applicable, additional medications reviewed and ordered per protocol based on lifetime cumulative doses and/or treatment regimen.   Plan for follow-up and/or issues identified: No . I-vent associated with next due treatment: No . MD and/or nursing notified: No  Philomena Course 09/21/2019 3:33 PM

## 2019-09-24 NOTE — Progress Notes (Signed)
[place] [facility] OFFICE PROGRESS NOTE  [name], MD [address] [place] [address]  DIAGNOSIS: Stage IIIB(T4, N2, M0) non-small cell lung cancer, poorly differentiated squamous cell carcinoma. She presented with large right upper lobe lung mass in addition to right hilar and mediastinal lymphadenopathy diagnosed in January 2020.  PRIOR THERAPY: Concurrent chemoradiation with weekly carboplatin for AUC of 2 and paclitaxel 45 mg/M2. First dose July 13, 2018. Status post 8 cycles. Last dose was given August 31, 2018.  CURRENT THERAPY: Consolidation treatment with immunotherapy with Imfinzi 10 mg/KG every 2 weeks. First dose starts October 13, 2018. Status post25cycles.  INTERVAL HISTORY: Christina [name] 74 y.o. female returns to the clinic for a follow up visit. The patient is feeling well today without any concerning complaints except for allergies.The patient continues to tolerate immunotherapy with imfinziwell without any adverse sideeffects. Denies any fever, chills, night sweats, or weight loss. Denies any chest pain, shortness of breath, cough, or hemoptysis. Denies any nausea, vomiting, diarrhea, or constipation. Denies any headache or visual changes. Denies any rashes or skin changes. The patient is here today for evaluation prior to starting her final cycle #26   MEDICAL HISTORY: Past Medical History:  Diagnosis Date  . Allergy   . Cancer ([place])    lung cancer  . GERD (gastroesophageal reflux disease)   . Hyperlipidemia   . Hypertension   . Reflux   . Tubulovillous adenoma of colon 2003    ALLERGIES:  is allergic to coconut oil; codeine; shrimp [shellfish allergy]; and trandolapril.  MEDICATIONS:  Current Outpatient Medications  Medication Sig Dispense Refill  . albuterol (VENTOLIN HFA) 108 (90 Base) MCG/ACT inhaler INHALE 2 PUFFS INTO THE LUNGS EVERY 6 HOURS AS NEEDED FOR WHEEZING OR SHORTNESS OF BREATH 6.7 g 2  .  amLODipine (NORVASC) 10 MG tablet TAKE 1 TABLET(10 MG) BY MOUTH DAILY 90 tablet 3  . aspirin EC 81 MG tablet Take 81 mg by mouth daily.    Marland Kitchen atorvastatin (LIPITOR) 40 MG tablet TAKE 1 TABLET BY MOUTH DAILY 90 tablet 3  . fluticasone (FLONASE) 50 MCG/ACT nasal spray     . HYDROcodone-acetaminophen (HYCET) 7.5-325 mg/15 ml solution Take 15 mLs by mouth 4 (four) times daily as needed. 473 mL 0  . omeprazole (PRILOSEC) 40 MG capsule TAKE 1 CAPSULE(40 MG) BY MOUTH DAILY 90 capsule 2  . ondansetron (ZOFRAN-ODT) 4 MG disintegrating tablet     . potassium chloride (KLOR-CON) 10 MEQ tablet TAKE 1 TABLET(10 MEQ) BY MOUTH TWICE DAILY 60 tablet 2  . prochlorperazine (COMPAZINE) 10 MG tablet Take 1 tablet (10 mg total) by mouth every 6 (six) hours as needed for nausea or vomiting. 30 tablet 0  . sucralfate (CARAFATE) 1 g tablet Take 1 tablet (1 g total) by mouth 4 (four) times daily as needed (stomach acid). 120 tablet 6   No current facility-administered medications for this visit.    SURGICAL HISTORY:  Past Surgical History:  Procedure Laterality Date  . CESAREAN SECTION     1 time  . COLONOSCOPY    . VIDEO BRONCHOSCOPY WITH ENDOBRONCHIAL NAVIGATION N/A 06/24/2018   Procedure: VIDEO BRONCHOSCOPY WITH ENDOBRONCHIAL NAVIGATION with biopsies, right upper lung lobe;  Surgeon: Melrose Nakayama, MD;  Location: [facility] OR;  Service: Thoracic;  Laterality: N/A;  . VIDEO BRONCHOSCOPY WITH ENDOBRONCHIAL ULTRASOUND N/A 06/24/2018   Procedure: VIDEO BRONCHOSCOPY WITH ENDOBRONCHIAL ULTRASOUND, right upper lung lobe;  Surgeon: Melrose Nakayama, MD;  Location: [facility] OR;  Service: Thoracic;  Laterality: N/A;    REVIEW OF SYSTEMS:   Review of Systems  Constitutional: Negative for appetite change, chills, fatigue, fever and unexpected weight change.  HENT:   Negative for mouth sores, nosebleeds, sore throat and trouble swallowing.   Eyes: Negative for eye problems and icterus.  Respiratory: Negative for cough,  hemoptysis, shortness of breath and wheezing.  Cardiovascular: Negative for chest pain and leg swelling.  Gastrointestinal: Negative for abdominal pain, constipation, diarrhea, nausea and vomiting.  Genitourinary: Negative for bladder incontinence, difficulty urinating, dysuria, frequency and hematuria.   Musculoskeletal: Negative for back pain, gait problem, neck pain and neck stiffness.  Skin: Negative for itching and rash.  Neurological: Negative for dizziness, extremity weakness, gait problem, headaches, light-headedness and seizures.  Hematological: Negative for adenopathy. Does not bruise/bleed easily.  Psychiatric/Behavioral: Negative for confusion, depression and sleep disturbance. The patient is not nervous/anxious.     PHYSICAL EXAMINATION:  Blood pressure (!) 133/94, pulse 93, temperature 98 F (36.7 C), temperature source Temporal, resp. rate 17, height 5\' 7"  (1.702 m), weight 180 lb 3.2 oz (81.7 kg), SpO2 98 %.  ECOG PERFORMANCE STATUS: 1 - Symptomatic but completely ambulatory  Physical Exam  Constitutional: Oriented to person, place, and time and well-developed, well-nourished, and in no distress.   HENT:  Head: Normocephalic and atraumatic.  Mouth/Throat: Oropharynx is clear and moist. No oropharyngeal exudate.  Eyes: Conjunctivae are normal. Right eye exhibits no discharge. Left eye exhibits no discharge. No scleral icterus.  Neck: Normal range of motion. Neck supple.  Cardiovascular: Normal rate, regular rhythm, normal heart sounds and intact distal pulses.   Pulmonary/Chest: Effort normal and breath sounds normal. Mild wheezing in RUL, clear in all other lung fields.  No respiratory distress. No rales.  Abdominal: Soft. Bowel sounds are normal. Exhibits no distension and no mass. There is no tenderness.  Musculoskeletal: Normal range of motion. Exhibits no edema.  Lymphadenopathy:    No cervical adenopathy.  Neurological: Alert and oriented to person, place, and time.  Exhibits normal muscle tone. Gait normal. Coordination normal.  Skin: Skin is warm and dry. No rash noted. Not diaphoretic. No erythema. No pallor.  Psychiatric: Mood, memory and judgment normal.  Vitals reviewed.  LABORATORY DATA: Lab Results  Component Value Date   WBC 5.2 09/27/2019   HGB 12.9 09/27/2019   HCT 40.6 09/27/2019   MCV 91.2 09/27/2019   PLT 295 09/27/2019      Chemistry      Component Value Date/Time   NA 141 09/27/2019 0839   NA 144 04/02/2017 1435   K 3.5 09/27/2019 0839   CL 107 09/27/2019 0839   CO2 23 09/27/2019 0839   BUN 11 09/27/2019 0839   BUN 11 04/02/2017 1435   CREATININE 0.95 09/27/2019 0839   CREATININE 0.87 03/22/2016 0908      Component Value Date/Time   CALCIUM 8.9 09/27/2019 0839   ALKPHOS 95 09/27/2019 0839   AST 16 09/27/2019 0839   ALT 14 09/27/2019 0839   BILITOT 0.4 09/27/2019 0839       RADIOGRAPHIC STUDIES:  No results found.   ASSESSMENT/PLAN:  This is a very pleasant 74 year old African-American female diagnosed with stage IIIb(T4, N2, M0) non-small cell lung cancer, poorly differentiated squamous cell carcinoma. She presented with a right upper lung mass in addition to right hilar and mediastinal lymphadenopathy. She was diagnosed in January 2020.  The patient previously received concurrent chemoradiation with weekly carboplatin for an AUC of  2 and paclitaxel 45 mg/m. She is status post 8 cycles.  The patient is currently undergoing consolidation immunotherapy with Imfinzi 10 mg/kg IV every 2 weeks. She is status post25cycles of treatment. She has been tolerating it well without any adverse side effects.  Labs were reviewed. I recommend that she proceed with her final cycle #26today as scheduled.  I will arrange for a restaging CT scan in the next 2-3 weeks.   We will see her back for a follow up visit in 2-3 weeks for evaluation and to review the results of the restaging CT.  The patient was advised to  call immediately if she has any concerning symptoms in the interval. The patient voices understanding of current disease status and treatment options and is in agreement with the current care plan. All questions were answered. The patient knows to call the clinic with any problems, questions or concerns. We can certainly see the patient much sooner if necessary    Orders Placed This Encounter  Procedures  . CT Chest W Contrast    Standing Status:   Future    Standing Expiration Date:   09/26/2020    Order Specific Question:   ** REASON FOR EXAM (FREE TEXT)    Answer:   Restaging Lung Cancer    Order Specific Question:   If indicated for the ordered procedure, I authorize the administration of contrast media per Radiology protocol    Answer:   Yes    Order Specific Question:   Preferred imaging location?    Answer:   [facility]    Order Specific Question:   Radiology Contrast Protocol - do NOT remove file path    Answer:   [email or web address]  . CBC with Differential ([place] Only)    Standing Status:   Future    Standing Expiration Date:   09/26/2020  . CMP ([place] only)    Standing Status:   Future    Standing Expiration Date:   09/26/2020     Sonnie Pawloski L Kaiya Boatman, PA-C 09/27/19

## 2019-09-27 ENCOUNTER — Other Ambulatory Visit: Payer: Self-pay

## 2019-09-27 ENCOUNTER — Inpatient Hospital Stay: Payer: BC Managed Care – PPO

## 2019-09-27 ENCOUNTER — Telehealth: Payer: Self-pay | Admitting: Physician Assistant

## 2019-09-27 ENCOUNTER — Inpatient Hospital Stay: Payer: BC Managed Care – PPO | Attending: Internal Medicine | Admitting: Physician Assistant

## 2019-09-27 VITALS — BP 133/94 | HR 93 | Temp 98.0°F | Resp 17 | Ht 67.0 in | Wt 180.2 lb

## 2019-09-27 DIAGNOSIS — C3491 Malignant neoplasm of unspecified part of right bronchus or lung: Secondary | ICD-10-CM

## 2019-09-27 DIAGNOSIS — R591 Generalized enlarged lymph nodes: Secondary | ICD-10-CM | POA: Insufficient documentation

## 2019-09-27 DIAGNOSIS — Z79899 Other long term (current) drug therapy: Secondary | ICD-10-CM | POA: Diagnosis not present

## 2019-09-27 DIAGNOSIS — Z923 Personal history of irradiation: Secondary | ICD-10-CM | POA: Diagnosis not present

## 2019-09-27 DIAGNOSIS — Z9221 Personal history of antineoplastic chemotherapy: Secondary | ICD-10-CM | POA: Insufficient documentation

## 2019-09-27 DIAGNOSIS — C3411 Malignant neoplasm of upper lobe, right bronchus or lung: Secondary | ICD-10-CM | POA: Diagnosis not present

## 2019-09-27 DIAGNOSIS — Z5112 Encounter for antineoplastic immunotherapy: Secondary | ICD-10-CM | POA: Diagnosis not present

## 2019-09-27 LAB — CBC WITH DIFFERENTIAL (CANCER CENTER ONLY)
Abs Immature Granulocytes: 0.01 10*3/uL (ref 0.00–0.07)
Basophils Absolute: 0.1 10*3/uL (ref 0.0–0.1)
Basophils Relative: 1 %
Eosinophils Absolute: 0.2 10*3/uL (ref 0.0–0.5)
Eosinophils Relative: 4 %
HCT: 40.6 % (ref 36.0–46.0)
Hemoglobin: 12.9 g/dL (ref 12.0–15.0)
Immature Granulocytes: 0 %
Lymphocytes Relative: 25 %
Lymphs Abs: 1.3 10*3/uL (ref 0.7–4.0)
MCH: 29 pg (ref 26.0–34.0)
MCHC: 31.8 g/dL (ref 30.0–36.0)
MCV: 91.2 fL (ref 80.0–100.0)
Monocytes Absolute: 0.4 10*3/uL (ref 0.1–1.0)
Monocytes Relative: 8 %
Neutro Abs: 3.2 10*3/uL (ref 1.7–7.7)
Neutrophils Relative %: 62 %
Platelet Count: 295 10*3/uL (ref 150–400)
RBC: 4.45 MIL/uL (ref 3.87–5.11)
RDW: 15.7 % — ABNORMAL HIGH (ref 11.5–15.5)
WBC Count: 5.2 10*3/uL (ref 4.0–10.5)
nRBC: 0 % (ref 0.0–0.2)

## 2019-09-27 LAB — CMP (CANCER CENTER ONLY)
ALT: 14 U/L (ref 0–44)
AST: 16 U/L (ref 15–41)
Albumin: 3.4 g/dL — ABNORMAL LOW (ref 3.5–5.0)
Alkaline Phosphatase: 95 U/L (ref 38–126)
Anion gap: 11 (ref 5–15)
BUN: 11 mg/dL (ref 8–23)
CO2: 23 mmol/L (ref 22–32)
Calcium: 8.9 mg/dL (ref 8.9–10.3)
Chloride: 107 mmol/L (ref 98–111)
Creatinine: 0.95 mg/dL (ref 0.44–1.00)
GFR, Est AFR Am: >60 mL/min (ref >60)
GFR, Estimated: 60 mL/min — ABNORMAL LOW (ref >60)
Glucose, Bld: 110 mg/dL — ABNORMAL HIGH (ref 70–99)
Potassium: 3.5 mmol/L (ref 3.5–5.1)
Sodium: 141 mmol/L (ref 135–145)
Total Bilirubin: 0.4 mg/dL (ref 0.3–1.2)
Total Protein: 6.8 g/dL (ref 6.5–8.1)

## 2019-09-27 MED ORDER — SODIUM CHLORIDE 0.9 % IV SOLN
Freq: Once | INTRAVENOUS | Status: AC
  Filled 2019-09-27: qty 250

## 2019-09-27 MED ORDER — SODIUM CHLORIDE 0.9 % IV SOLN
10.0000 mg/kg | Freq: Once | INTRAVENOUS | Status: AC
  Administered 2019-09-27: 740 mg via INTRAVENOUS
  Filled 2019-09-27: qty 10

## 2019-09-27 NOTE — Telephone Encounter (Signed)
Scheduled per los. Called and left msg. Mailed printout  °

## 2019-09-27 NOTE — Patient Instructions (Signed)
[facility] Discharge Instructions for Patients Receiving Chemotherapy  Today you received the following chemotherapy agents: durvalumab.  To help prevent nausea and vomiting after your treatment, we encourage you to take your nausea medication as directed.   If you develop nausea and vomiting that is not controlled by your nausea medication, call the clinic.   BELOW ARE SYMPTOMS THAT SHOULD BE REPORTED IMMEDIATELY:  *FEVER GREATER THAN 100.5 F  *CHILLS WITH OR WITHOUT FEVER  NAUSEA AND VOMITING THAT IS NOT CONTROLLED WITH YOUR NAUSEA MEDICATION  *UNUSUAL SHORTNESS OF BREATH  *UNUSUAL BRUISING OR BLEEDING  TENDERNESS IN MOUTH AND THROAT WITH OR WITHOUT PRESENCE OF ULCERS  *URINARY PROBLEMS  *BOWEL PROBLEMS  UNUSUAL RASH Items with * indicate a potential emergency and should be followed up as soon as possible.  Feel free to call the clinic should you have any questions or concerns. The clinic phone number is [phone].  Please show the CHEMO ALERT CARD at check-in to the Emergency Department and triage nurse.   

## 2019-10-09 ENCOUNTER — Other Ambulatory Visit: Payer: Self-pay | Admitting: Family Medicine

## 2019-10-09 DIAGNOSIS — I10 Essential (primary) hypertension: Secondary | ICD-10-CM

## 2019-10-11 ENCOUNTER — Other Ambulatory Visit: Payer: Self-pay

## 2019-10-11 ENCOUNTER — Encounter (HOSPITAL_COMMUNITY): Payer: Self-pay

## 2019-10-11 ENCOUNTER — Ambulatory Visit (HOSPITAL_COMMUNITY)
Admission: RE | Admit: 2019-10-11 | Discharge: 2019-10-11 | Disposition: A | Discharge status: Home / Self Care | Payer: BC Managed Care – PPO | Source: Ambulatory Visit | Attending: Physician Assistant | Admitting: Physician Assistant

## 2019-10-11 DIAGNOSIS — C3491 Malignant neoplasm of unspecified part of right bronchus or lung: Secondary | ICD-10-CM | POA: Diagnosis not present

## 2019-10-11 DIAGNOSIS — C3490 Malignant neoplasm of unspecified part of unspecified bronchus or lung: Secondary | ICD-10-CM | POA: Diagnosis not present

## 2019-10-11 MED ORDER — SODIUM CHLORIDE (PF) 0.9 % IJ SOLN
INTRAMUSCULAR | Status: AC
  Filled 2019-10-11: qty 50

## 2019-10-11 MED ORDER — IOHEXOL 300 MG/ML  SOLN
75.0000 mL | Freq: Once | INTRAMUSCULAR | Status: AC | PRN
  Administered 2019-10-11: 75 mL via INTRAVENOUS

## 2019-10-14 ENCOUNTER — Inpatient Hospital Stay: Payer: BC Managed Care – PPO

## 2019-10-14 ENCOUNTER — Other Ambulatory Visit: Payer: Self-pay

## 2019-10-14 ENCOUNTER — Inpatient Hospital Stay (HOSPITAL_BASED_OUTPATIENT_CLINIC_OR_DEPARTMENT_OTHER): Payer: BC Managed Care – PPO | Admitting: Internal Medicine

## 2019-10-14 ENCOUNTER — Encounter: Payer: Self-pay | Admitting: Internal Medicine

## 2019-10-14 VITALS — BP 136/86 | HR 99 | Temp 98.2°F | Resp 18 | Ht 67.0 in | Wt 180.3 lb

## 2019-10-14 DIAGNOSIS — C3491 Malignant neoplasm of unspecified part of right bronchus or lung: Secondary | ICD-10-CM | POA: Diagnosis not present

## 2019-10-14 DIAGNOSIS — R591 Generalized enlarged lymph nodes: Secondary | ICD-10-CM | POA: Diagnosis not present

## 2019-10-14 DIAGNOSIS — Z923 Personal history of irradiation: Secondary | ICD-10-CM | POA: Diagnosis not present

## 2019-10-14 DIAGNOSIS — C3490 Malignant neoplasm of unspecified part of unspecified bronchus or lung: Secondary | ICD-10-CM

## 2019-10-14 DIAGNOSIS — Z87891 Personal history of nicotine dependence: Secondary | ICD-10-CM | POA: Diagnosis not present

## 2019-10-14 DIAGNOSIS — C3411 Malignant neoplasm of upper lobe, right bronchus or lung: Secondary | ICD-10-CM | POA: Diagnosis not present

## 2019-10-14 DIAGNOSIS — Z5112 Encounter for antineoplastic immunotherapy: Secondary | ICD-10-CM | POA: Diagnosis not present

## 2019-10-14 DIAGNOSIS — I10 Essential (primary) hypertension: Secondary | ICD-10-CM

## 2019-10-14 DIAGNOSIS — Z79899 Other long term (current) drug therapy: Secondary | ICD-10-CM | POA: Diagnosis not present

## 2019-10-14 DIAGNOSIS — Z9221 Personal history of antineoplastic chemotherapy: Secondary | ICD-10-CM | POA: Diagnosis not present

## 2019-10-14 LAB — CBC WITH DIFFERENTIAL (CANCER CENTER ONLY)
Abs Immature Granulocytes: 0.03 10*3/uL (ref 0.00–0.07)
Basophils Absolute: 0.1 10*3/uL (ref 0.0–0.1)
Basophils Relative: 2 %
Eosinophils Absolute: 0.1 10*3/uL (ref 0.0–0.5)
Eosinophils Relative: 3 %
HCT: 41.9 % (ref 36.0–46.0)
Hemoglobin: 13.6 g/dL (ref 12.0–15.0)
Immature Granulocytes: 1 %
Lymphocytes Relative: 24 %
Lymphs Abs: 1.2 10*3/uL (ref 0.7–4.0)
MCH: 28.9 pg (ref 26.0–34.0)
MCHC: 32.5 g/dL (ref 30.0–36.0)
MCV: 89.1 fL (ref 80.0–100.0)
Monocytes Absolute: 0.5 10*3/uL (ref 0.1–1.0)
Monocytes Relative: 11 %
Neutro Abs: 2.9 10*3/uL (ref 1.7–7.7)
Neutrophils Relative %: 59 %
Platelet Count: 326 10*3/uL (ref 150–400)
RBC: 4.7 MIL/uL (ref 3.87–5.11)
RDW: 15.2 % (ref 11.5–15.5)
WBC Count: 4.9 10*3/uL (ref 4.0–10.5)
nRBC: 0 % (ref 0.0–0.2)

## 2019-10-14 LAB — CMP (CANCER CENTER ONLY)
ALT: 14 U/L (ref 0–44)
AST: 17 U/L (ref 15–41)
Albumin: 3.7 g/dL (ref 3.5–5.0)
Alkaline Phosphatase: 109 U/L (ref 38–126)
Anion gap: 10 (ref 5–15)
BUN: 17 mg/dL (ref 8–23)
CO2: 24 mmol/L (ref 22–32)
Calcium: 9.6 mg/dL (ref 8.9–10.3)
Chloride: 108 mmol/L (ref 98–111)
Creatinine: 1.12 mg/dL — ABNORMAL HIGH (ref 0.44–1.00)
GFR, Est AFR Am: 57 mL/min — ABNORMAL LOW (ref >60)
GFR, Estimated: 49 mL/min — ABNORMAL LOW (ref >60)
Glucose, Bld: 111 mg/dL — ABNORMAL HIGH (ref 70–99)
Potassium: 3.5 mmol/L (ref 3.5–5.1)
Sodium: 142 mmol/L (ref 135–145)
Total Bilirubin: 0.6 mg/dL (ref 0.3–1.2)
Total Protein: 7.5 g/dL (ref 6.5–8.1)

## 2019-10-14 NOTE — Progress Notes (Signed)
[facility] [phone]   [phone]  OFFICE PROGRESS NOTE  [name], MD [address] - [place] [address]  DIAGNOSIS: Stage IIIB (T4, N2, M0) non-small cell lung cancer, poorly differentiated squamous cell carcinoma.  She presented with large right upper lobe lung mass in addition to right hilar and mediastinal lymphadenopathy diagnosed in January 2020.  PRIOR THERAPY:  1) Concurrent chemoradiation with weekly carboplatin for AUC of 2 and paclitaxel 45 mg/M2.  First dose July 13, 2018.  Status post 8 cycles.  Last dose was given August 31, 2018. 2) Consolidation treatment with immunotherapy with Imfinzi 10 mg/KG every 2 weeks.  First dose starts October 13, 2018.  Status post 26 cycles.  CURRENT THERAPY: Observation.  INTERVAL HISTORY: Christina [name] 74 y.o. female returns to the clinic today for follow-up visit.  The patient is feeling fine today with no concerning complaints.  She denied having any current chest pain, shortness of breath, cough or hemoptysis.  She denied having any fever or chills.  She has no nausea, vomiting, diarrhea or constipation.  She has no headache or visual changes.  She has no weight loss or night sweats.  The patient tolerated the previous course of consolidation treatment with immunotherapy fairly well.  She is here today for evaluation with repeat CT scan of the chest for restaging of her disease.   MEDICAL HISTORY: Past Medical History:  Diagnosis Date  . Allergy   . GERD (gastroesophageal reflux disease)   . Hyperlipidemia   . Hypertension   . nscl ca dx'd 05/2018   lung cancer  . Reflux   . Tubulovillous adenoma of colon 2003    ALLERGIES:  is allergic to coconut oil; codeine; shrimp [shellfish allergy]; and trandolapril.  MEDICATIONS:  Current Outpatient Medications  Medication Sig Dispense Refill  . albuterol (VENTOLIN HFA) 108 (90 Base) MCG/ACT inhaler INHALE 2  PUFFS INTO THE LUNGS EVERY 6 HOURS AS NEEDED FOR WHEEZING OR SHORTNESS OF BREATH 6.7 g 2  . amLODipine (NORVASC) 10 MG tablet TAKE 1 TABLET(10 MG) BY MOUTH DAILY 90 tablet 0  . aspirin EC 81 MG tablet Take 81 mg by mouth daily.    Marland Kitchen atorvastatin (LIPITOR) 40 MG tablet TAKE 1 TABLET BY MOUTH DAILY 90 tablet 3  . fluticasone (FLONASE) 50 MCG/ACT nasal spray     . HYDROcodone-acetaminophen (HYCET) 7.5-325 mg/15 ml solution Take 15 mLs by mouth 4 (four) times daily as needed. 473 mL 0  . omeprazole (PRILOSEC) 40 MG capsule TAKE 1 CAPSULE(40 MG) BY MOUTH DAILY 90 capsule 2  . ondansetron (ZOFRAN-ODT) 4 MG disintegrating tablet     . potassium chloride (KLOR-CON) 10 MEQ tablet TAKE 1 TABLET(10 MEQ) BY MOUTH TWICE DAILY 60 tablet 2  . prochlorperazine (COMPAZINE) 10 MG tablet Take 1 tablet (10 mg total) by mouth every 6 (six) hours as needed for nausea or vomiting. 30 tablet 0  . sucralfate (CARAFATE) 1 g tablet Take 1 tablet (1 g total) by mouth 4 (four) times daily as needed (stomach acid). 120 tablet 6   No current facility-administered medications for this visit.    SURGICAL HISTORY:  Past Surgical History:  Procedure Laterality Date  . CESAREAN SECTION     1 time  . COLONOSCOPY    . VIDEO BRONCHOSCOPY WITH ENDOBRONCHIAL NAVIGATION N/A 06/24/2018   Procedure: VIDEO BRONCHOSCOPY WITH ENDOBRONCHIAL NAVIGATION with biopsies, right upper lung lobe;  Surgeon: Modesto Charon  C, MD;  Location: [facility];  Service: Thoracic;  Laterality: N/A;  . VIDEO BRONCHOSCOPY WITH ENDOBRONCHIAL ULTRASOUND N/A 06/24/2018   Procedure: VIDEO BRONCHOSCOPY WITH ENDOBRONCHIAL ULTRASOUND, right upper lung lobe;  Surgeon: Melrose Nakayama, MD;  Location: [facility] OR;  Service: Thoracic;  Laterality: N/A;    REVIEW OF SYSTEMS:  Constitutional: negative Eyes: negative Ears, nose, mouth, throat, and face: negative Respiratory: negative Cardiovascular: negative Gastrointestinal:  negative Genitourinary:negative Integument/breast: negative Hematologic/lymphatic: negative Musculoskeletal:negative Neurological: negative Behavioral/Psych: negative Endocrine: negative Allergic/Immunologic: negative   PHYSICAL EXAMINATION: General appearance: alert, cooperative and no distress Head: Normocephalic, without obvious abnormality, atraumatic Neck: no adenopathy, no JVD, supple, symmetrical, trachea midline and thyroid not enlarged, symmetric, no tenderness/mass/nodules Lymph nodes: Cervical, supraclavicular, and axillary nodes normal. Resp: clear to auscultation bilaterally Back: symmetric, no curvature. ROM normal. No CVA tenderness. Cardio: regular rate and rhythm, S1, S2 normal, no murmur, click, rub or gallop GI: soft, non-tender; bowel sounds normal; no masses,  no organomegaly Extremities: extremities normal, atraumatic, no cyanosis or edema Neurologic: Alert and oriented X 3, normal strength and tone. Normal symmetric reflexes. Normal coordination and gait  ECOG PERFORMANCE STATUS: 0 - Asymptomatic  Blood pressure 136/86, pulse 99, temperature 98.2 F (36.8 C), temperature source Temporal, resp. rate 18, height 5\' 7"  (1.702 m), weight 180 lb 4.8 oz (81.8 kg), SpO2 98 %.  LABORATORY DATA: Lab Results  Component Value Date   WBC 4.9 10/14/2019   HGB 13.6 10/14/2019   HCT 41.9 10/14/2019   MCV 89.1 10/14/2019   PLT 326 10/14/2019      Chemistry      Component Value Date/Time   NA 142 10/14/2019 1058   NA 144 04/02/2017 1435   K 3.5 10/14/2019 1058   CL 108 10/14/2019 1058   CO2 24 10/14/2019 1058   BUN 17 10/14/2019 1058   BUN 11 04/02/2017 1435   CREATININE 1.12 (H) 10/14/2019 1058   CREATININE 0.87 03/22/2016 0908      Component Value Date/Time   CALCIUM 9.6 10/14/2019 1058   ALKPHOS 109 10/14/2019 1058   AST 17 10/14/2019 1058   ALT 14 10/14/2019 1058   BILITOT 0.6 10/14/2019 1058       RADIOGRAPHIC STUDIES: CT Chest W  Contrast  Result Date: 10/11/2019 CLINICAL DATA:  Non-small cell lung cancer diagnosed 2019 EXAM: CT CHEST WITH CONTRAST TECHNIQUE: Multidetector CT imaging of the chest was performed during intravenous contrast administration. CONTRAST:  8mL OMNIPAQUE IOHEXOL 300 MG/ML  SOLN COMPARISON:  06/21/2019 FINDINGS: Cardiovascular: Coronary artery calcification and aortic atherosclerotic calcification. Small pericardial fluid in the anterior mediastinum unchanged from prior. Mediastinum/Nodes: No axillary supraclavicular adenopathy. No mediastinal hilar adenopathy no pericardial effusion. Esophagus normal. Enlarged heterogeneous thyroid nodule measuring 2.4 cm again noted. No change from CT 06/02/2018. Lungs/Pleura: There is a band of pleural-parenchymal thickening in the RIGHT upper lobe unchanged from prior. The band of pleural-parenchymal thickening measures 3.0 cm (image 60/7) is similar level to comparison exam at which time it measured 3.1 cm. Visually no interval change. No new or suspicious pulmonary nodularity. Upper Abdomen: Limited view of the liver, kidneys, pancreas are unremarkable. Normal adrenal glands. Musculoskeletal: No acute osseous abnormality. IMPRESSION: 1. Stable band of pleural-parenchymal thickening in the RIGHT upper lobe. 2. No evidence of lung cancer recurrence or nodal metastasis. 3. Heterogeneous enlarged thyroid nodule stable from comparison CT. Electronically Signed   By: Suzy Bouchard M.D.   On: 10/11/2019 10:34    ASSESSMENT AND PLAN: This is a very pleasant  74 years old African-American female recently diagnosed with a stage IIIB (T4, N2, M0) non-small cell lung cancer, poorly differentiated squamous cell carcinoma presented with right upper lobe lung mass in addition to right hilar and mediastinal lymphadenopathy. The patient underwent concurrent chemoradiation with weekly carboplatin and paclitaxel status post 8 cycles. She has partial response to this treatment. She also  completed a course of consolidation treatment with immunotherapy with Imfinzi every 2 weeks status post 26 cycles. The patient is feeling fine today with no concerning complaints. She had repeat CT scan of the chest performed recently.  I personally and independently reviewed the scans and discussed the results with the patient today. Her scan showed no concerning findings for disease progression. I recommended for the patient to continue on observation with repeat CT scan of the chest in 3 months. For the hypertension, she was advised to continue on her current blood pressure medication and to monitor it closely at home. The patient was advised to call immediately if she has any other concerning symptoms in the interval. The patient voices understanding of current disease status and treatment options and is in agreement with the current care plan.  All questions were answered. The patient knows to call the clinic with any problems, questions or concerns. We can certainly see the patient much sooner if necessary.  Disclaimer: This note was dictated with voice recognition software. Similar sounding words can inadvertently be transcribed and may not be corrected upon review.

## 2019-10-15 ENCOUNTER — Telehealth: Payer: Self-pay | Admitting: Internal Medicine

## 2019-10-15 NOTE — Telephone Encounter (Signed)
Scheduled per los. Called and left msg. Mailed printout  °

## 2019-10-21 ENCOUNTER — Other Ambulatory Visit: Payer: Self-pay | Admitting: Family Medicine

## 2019-10-21 DIAGNOSIS — Z1231 Encounter for screening mammogram for malignant neoplasm of breast: Secondary | ICD-10-CM

## 2019-11-17 ENCOUNTER — Other Ambulatory Visit: Payer: Self-pay

## 2019-11-17 ENCOUNTER — Encounter: Payer: Self-pay | Admitting: Family Medicine

## 2019-11-17 ENCOUNTER — Ambulatory Visit: Payer: BC Managed Care – PPO | Admitting: Family Medicine

## 2019-11-17 ENCOUNTER — Telehealth: Payer: BC Managed Care – PPO | Admitting: Family Medicine

## 2019-11-17 VITALS — BP 132/90 | HR 92 | Temp 98.1°F | Ht 67.0 in | Wt 181.0 lb

## 2019-11-17 DIAGNOSIS — Z5181 Encounter for therapeutic drug level monitoring: Secondary | ICD-10-CM

## 2019-11-17 DIAGNOSIS — R739 Hyperglycemia, unspecified: Secondary | ICD-10-CM

## 2019-11-17 DIAGNOSIS — I10 Essential (primary) hypertension: Secondary | ICD-10-CM | POA: Diagnosis not present

## 2019-11-17 DIAGNOSIS — E785 Hyperlipidemia, unspecified: Secondary | ICD-10-CM | POA: Diagnosis not present

## 2019-11-17 DIAGNOSIS — R062 Wheezing: Secondary | ICD-10-CM | POA: Diagnosis not present

## 2019-11-17 DIAGNOSIS — Z91013 Allergy to seafood: Secondary | ICD-10-CM

## 2019-11-17 DIAGNOSIS — Z1211 Encounter for screening for malignant neoplasm of colon: Secondary | ICD-10-CM | POA: Diagnosis not present

## 2019-11-17 MED ORDER — POTASSIUM CHLORIDE CRYS ER 10 MEQ PO TBCR: EXTENDED_RELEASE_TABLET | ORAL | 6 refills | Status: DC

## 2019-11-17 MED ORDER — AMLODIPINE BESYLATE 10 MG PO TABS: 10.0000 mg | ORAL_TABLET | Freq: Every day | ORAL | 1 refills | Status: DC

## 2019-11-17 MED ORDER — EPINEPHRINE 0.3 MG/0.3ML IJ SOAJ: 0.3000 mg | INTRAMUSCULAR | 1 refills | Status: DC | PRN

## 2019-11-17 MED ORDER — ATORVASTATIN CALCIUM 40 MG PO TABS: ORAL_TABLET | ORAL | 3 refills | Status: DC

## 2019-11-17 MED ORDER — ALBUTEROL SULFATE HFA 108 (90 BASE) MCG/ACT IN AERS: INHALATION_SPRAY | RESPIRATORY_TRACT | 1 refills | Status: DC

## 2019-11-17 NOTE — Progress Notes (Signed)
Subjective:  Patient ID: Christina [name], female    DOB: Mar 18, 1946  Age: 74 y.o. MRN: [id number]  CC:  Chief Complaint  Patient presents with  . Establish Care    pt reports as far as her general health she feels good with no complaints.  . Follow-up    hyperytension pt reports no issues with BP since last visit. per pt medication has been working well with no side effects. pt reports no physical symptoms of hypertension.    HPI Christina [name] presents for  Establish care, previous patient of Dr. Nolon Rod  Hypertension: Amlodipine 10 mg daily, potassium 10 mEq BID Home readings: 120/82- 140/85.  BP Readings from Last 3 Encounters:  11/17/19 132/90  10/14/19 136/86  09/27/19 (!) 133/94   Lab Results  Component Value Date   CREATININE 1.12 (H) 10/14/2019    Episodic wheezing: Albuterol once every 2-3 weeks, controls symptoms. Unknown if asthma/copd.  Quit smoking 2 years ago. Prior 40 pack yr hx.   Shellfish allergy.  epipen rx needed.  Last reaction 1 year ago.   Hyperlipidemia: Lipitor 40 mg daily.  No new side effects.  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  Previous treatment with chemoradiation and consolidation treatment with immunotherapy Appointment April 29 with Dr. Earlie Server.  Recent CT without signs of disease progression, plan for repeat imaging in 3 months.  Due for colonoscopy - agrees to referral.   Hyperglycemia: glucose elevated few times on labs from oncology. 111 on 4/29.  No vision/thirst changes.  No n/v/abd pain.   History Patient Active Problem List   Diagnosis Date  Noted  . Encounter for antineoplastic immunotherapy 10/06/2018  . Stage III squamous cell carcinoma of right lung ([place]) 07/06/2018  . Goals of care, counseling/discussion 07/06/2018  . Encounter for antineoplastic chemotherapy 07/06/2018  . Primary cancer of right upper lobe of lung ([place]) 06/18/2018  . Acute URI 05/25/2018  . Seasonal allergies 10/06/2017  . Thyromegaly 07/01/2017  . Colon polyps 04/02/2017  . Former smoker 09/27/2016  . Cough 04/25/2016  . 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 GERD (gastroesophageal reflux disease) 08/10/2013  . Hyperlipidemia 06/23/2013  . Essential hypertension 07/23/2012   Past Medical History:  Diagnosis Date  . Allergy   . GERD (gastroesophageal reflux disease)   . Hyperlipidemia   . Hypertension   . nscl ca dx'd 05/2018   lung cancer  . Reflux   . Tubulovillous adenoma of colon 2003   Past Surgical History:  Procedure Laterality Date  . CESAREAN SECTION     1 time  . COLONOSCOPY    . VIDEO BRONCHOSCOPY WITH ENDOBRONCHIAL NAVIGATION N/A 06/24/2018   Procedure: VIDEO BRONCHOSCOPY WITH ENDOBRONCHIAL NAVIGATION with biopsies, right upper lung lobe;  Surgeon: Melrose Nakayama, MD;  Location: [facility] OR;  Service: Thoracic;  Laterality: N/A;  . VIDEO BRONCHOSCOPY WITH ENDOBRONCHIAL ULTRASOUND N/A 06/24/2018   Procedure: VIDEO BRONCHOSCOPY WITH ENDOBRONCHIAL ULTRASOUND, right upper lung lobe;  Surgeon: Melrose Nakayama, MD;  Location: [facility] OR;  Service: Thoracic;  Laterality: N/A;   Allergies  Allergen Reactions  . Coconut Oil Hives  . Codeine Other (See Comments)    Upsets stomach really bad  . Shrimp [Shellfish Allergy] Hives  . Trandolapril Swelling    Swelling of the face/lips 07/2014   Prior to Admission medications   Medication Sig Start Date End Date Taking? Authorizing Provider  albuterol (VENTOLIN HFA) 108 (90 Base) MCG/ACT inhaler INHALE 2 PUFFS INTO THE LUNGS EVERY 6 HOURS AS NEEDED FOR WHEEZING OR SHORTNESS OF  BREATH 09/13/19   Delia Chimes A, MD  amLODipine (NORVASC) 10 MG tablet TAKE 1 TABLET(10 MG) BY MOUTH DAILY 10/09/19   Delia Chimes A, MD  aspirin EC 81 MG tablet Take 81 mg by mouth daily.    [provider]  atorvastatin (LIPITOR) 40 MG tablet TAKE 1 TABLET BY MOUTH DAILY 11/25/18   [name], MD  fluticasone (FLONASE) 50 MCG/ACT nasal spray  07/05/19   [provider]  HYDROcodone-acetaminophen (HYCET) 7.5-325 mg/15 ml solution Take 15 mLs by mouth 4 (four) times daily as needed. 08/21/18   Kyung Rudd, MD  omeprazole (PRILOSEC) 40 MG capsule TAKE 1 CAPSULE(40 MG) BY MOUTH DAILY 09/15/19   [name], MD  ondansetron (ZOFRAN-ODT) 4 MG disintegrating tablet  07/05/19   [provider]  potassium chloride (KLOR-CON) 10 MEQ tablet TAKE 1 TABLET(10 MEQ) BY MOUTH TWICE DAILY 08/12/19   [name], MD  prochlorperazine (COMPAZINE) 10 MG tablet Take 1 tablet (10 mg total) by mouth every 6 (six) hours as needed for nausea or vomiting. 07/06/18   Curt Bears, MD  sucralfate (CARAFATE) 1 g tablet Take 1 tablet (1 g total) by mouth 4 (four) times daily as needed (stomach acid). 06/29/19   [name], MD   Social History   Socioeconomic History  . Marital status: Married    Spouse name: [name]  . Number of children: 1  . Years of education: 12th grade  . Highest education level: Not on file  Occupational History  . Occupation: school housekeeping    Comment: part-time  Tobacco Use  . Smoking status: Former Smoker    Packs/day: 0.50    Years: 50.00    Pack years: 25.00    Types: Cigarettes  . Smokeless tobacco: Never Used  . Tobacco comment: Previously quit in 2015 but has started smoking again   Substance and Sexual Activity  . Alcohol use: No    Alcohol/week: 0.0 standard drinks  . Drug use: No  . Sexual activity: Never  Other Topics Concern  . Not on file  Social History Narrative   Lives with her husband. Her daughter also lives in  [place] with her husband and 3 children.   Social Determinants of Health   Financial Resource Strain:   . Difficulty of Paying Living Expenses:   Food Insecurity:   . Worried About Charity fundraiser in the Last Year:   . Arboriculturist in the Last Year:   Transportation Needs:   . Film/video editor (Medical):   Marland Kitchen Lack of Transportation (Non-Medical):   Physical Activity:   . Days of Exercise per Week:   . Minutes of Exercise per Session:   Stress:   . Feeling of Stress :   Social Connections:   . Frequency of Communication with Friends and Family:   . Frequency of Social Gatherings with Friends and Family:   . Attends Religious Services:   . Active Member of Clubs or Organizations:   . Attends  Club or Organization Meetings:   Marland Kitchen Marital Status:   Intimate Partner Violence:   . Fear of Current or Ex-Partner:   . Emotionally Abused:   Marland Kitchen Physically Abused:   . Sexually Abused:     Review of Systems  Constitutional: Negative for fatigue and unexpected weight change.  Respiratory: Negative for chest tightness and shortness of breath.   Cardiovascular: Negative for chest pain, palpitations and leg swelling.  Gastrointestinal: Negative for abdominal pain and blood in stool.  Neurological: Negative for dizziness, syncope, light-headedness and headaches.     Objective:   Vitals:   11/17/19 0909  BP: 132/90  Pulse: 92  Temp: 98.1 F (36.7 C)  TempSrc: Temporal  SpO2: 96%  Weight: 181 lb (82.1 kg)  Height: 5\' 7"  (1.702 m)     Physical Exam Vitals reviewed.  Constitutional:      Appearance: She is well-developed.  HENT:     Head: Normocephalic and atraumatic.  Eyes:     Conjunctiva/sclera: Conjunctivae normal.     Pupils: Pupils are equal, round, and reactive to light.  Neck:     Vascular: No carotid bruit.  Cardiovascular:     Rate and Rhythm: Normal rate and regular rhythm.     Heart sounds: Normal heart sounds.  Pulmonary:     Effort: Pulmonary  effort is normal.     Breath sounds: Normal breath sounds.  Abdominal:     Palpations: Abdomen is soft. There is no pulsatile mass.     Tenderness: There is no abdominal tenderness.  Skin:    General: Skin is warm and dry.  Neurological:     Mental Status: She is alert and oriented to person, place, and time.  Psychiatric:        Behavior: Behavior normal.        Assessment & Plan:  ELBIA PARO is a 74 y.o. female . Essential hypertension - Plan: amLODipine (NORVASC) 10 MG tablet, potassium chloride (KLOR-CON) 10 MEQ tablet  -  Stable, tolerating current regimen. Medications refilled.   Wheezing - Asymptomatic. Uses albuterol PRN. - Plan: albuterol (VENTOLIN HFA) 108 (90 Base) MCG/ACT inhaler  -Rare use of albuterol, possible underlying asthma versus COPD with tobacco use.  Continue same regimen for now.  RTC precautions if frequent use.  Encounter for medication monitoring - Plan: atorvastatin (LIPITOR) 40 MG tablet Hyperlipidemia, unspecified hyperlipidemia type - Plan: Lipid panel  -Tolerating Lipitor, continue same.  Check labs  Special screening for malignant neoplasms, colon - Plan: Ambulatory referral to Gastroenterology  Shellfish allergy - Plan: EPINEPHrine 0.3 mg/0.3 mL IJ SOAJ injection  -EpiPen refilled if needed, appropriate use and need for ER evaluation discussed if used.  Hyperglycemia - Plan: Hemoglobin A1c  -Check A1c.  Meds ordered this encounter  Medications  . albuterol (VENTOLIN HFA) 108 (90 Base) MCG/ACT inhaler    Sig: INHALE 2 PUFFS INTO THE LUNGS EVERY 6 HOURS AS NEEDED FOR WHEEZING OR SHORTNESS OF BREATH    Dispense:  6.7 g    Refill:  1  . atorvastatin (LIPITOR) 40 MG tablet    Sig: TAKE 1 TABLET BY MOUTH DAILY    Dispense:  90 tablet    Refill:  3    Office visit needed for refills  . EPINEPHrine 0.3 mg/0.3 mL IJ SOAJ injection    Sig: Inject 0.3 mLs (0.3 mg total) into the muscle as needed for anaphylaxis (for shellfish allergy.).     Dispense:  1 each    Refill:  1  . amLODipine (NORVASC) 10 MG tablet    Sig: Take 1 tablet (10 mg total) by mouth daily.    Dispense:  90 tablet    Refill:  1  . potassium chloride (KLOR-CON) 10 MEQ tablet    Sig: TAKE 1 TABLET(10 MEQ) BY MOUTH TWICE DAILY    Dispense:  60 tablet    Refill:  6   Patient Instructions    Albuterol if needed.  If this is needed more than once per week - return to discuss further.   epipen refilled.   Blood sugar has been elevated a few times, I will check a diabetes screening test today as well as other blood work including your cholesterol levels.  No medication changes for now.  Thank you for coming in today.  Take care   If you have lab work done today you will be contacted with your lab results within the next 2 weeks.  If you have not heard from [place] then please contact us. The fastest way to get your results is to register for My Chart.   IF you received an x-ray today, you will receive an invoice from [facility]. Please contact [facility] at [phone] with questions or concerns regarding your invoice.   IF you received labwork today, you will receive an invoice from [facility]. Please contact [facility] at [phone] with questions or concerns regarding your invoice.   Our billing staff will not be able to assist you with questions regarding bills from these companies.  You will be contacted with the lab results as soon as they are available. The fastest way to get your results is to activate your My Chart account. Instructions are located on the last page of this paperwork. If you have not heard from [place] regarding the results in 2 weeks, please contact this office.         Signed, Merri Ray, MD [facility]

## 2019-11-17 NOTE — Patient Instructions (Addendum)
°  Albuterol if needed.  If this is needed more than once per week - return to discuss further.   epipen refilled.   Blood sugar has been elevated a few times, I will check a diabetes screening test today as well as other blood work including your cholesterol levels.  No medication changes for now.  Thank you for coming in today.  Take care   If you have lab work done today you will be contacted with your lab results within the next 2 weeks.  If you have not heard from [place] then please contact us. The fastest way to get your results is to register for My Chart.   IF you received an x-ray today, you will receive an invoice from [facility]. Please contact [facility] at [phone] with questions or concerns regarding your invoice.   IF you received labwork today, you will receive an invoice from [facility]. Please contact [facility] at [phone] with questions or concerns regarding your invoice.   Our billing staff will not be able to assist you with questions regarding bills from these companies.  You will be contacted with the lab results as soon as they are available. The fastest way to get your results is to activate your My Chart account. Instructions are located on the last page of this paperwork. If you have not heard from [place] regarding the results in 2 weeks, please contact this office.

## 2019-11-18 ENCOUNTER — Encounter: Payer: Self-pay | Admitting: Gastroenterology

## 2019-11-18 LAB — LIPID PANEL
Chol/HDL Ratio: 3.5 ratio (ref 0.0–4.4)
Cholesterol, Total: 164 mg/dL (ref 100–199)
HDL: 47 mg/dL (ref >39)
LDL Chol Calc (NIH): 101 mg/dL — ABNORMAL HIGH (ref 0–99)
Triglycerides: 83 mg/dL (ref 0–149)
VLDL Cholesterol Cal: 16 mg/dL (ref 5–40)

## 2019-11-18 LAB — HEMOGLOBIN A1C
Est. average glucose Bld gHb Est-mCnc: 114 mg/dL
Hgb A1c MFr Bld: 5.6 % (ref 4.8–5.6)

## 2019-11-29 ENCOUNTER — Encounter: Payer: BC Managed Care – PPO | Admitting: Family Medicine

## 2019-11-30 IMAGING — CT CT CHEST WITH CONTRAST
2 of 3 series · 15 of 36 positions shown, 18 images · IV contrast (omnipaque)
Comparison: 09/25/2018.

CLINICAL DATA: Follow-up right lung cancer diagnosed in May 2018. Chemotherapy and radiation therapy completed with ongoing
immunotherapy. Ex-smoker.

EXAM:
CT CHEST WITH CONTRAST
TECHNIQUE: Multidetector CT imaging of the chest was performed during
intravenous contrast administration.
CONTRAST:  75mL OMNIPAQUE IOHEXOL 300 MG/ML  SOLN

[Series 2: axial st · axial · 0.65mm/px · z∈[-326,-54]mm · 12 of 160 slices shown, 15 images]
[im 12/160  mediastinal]
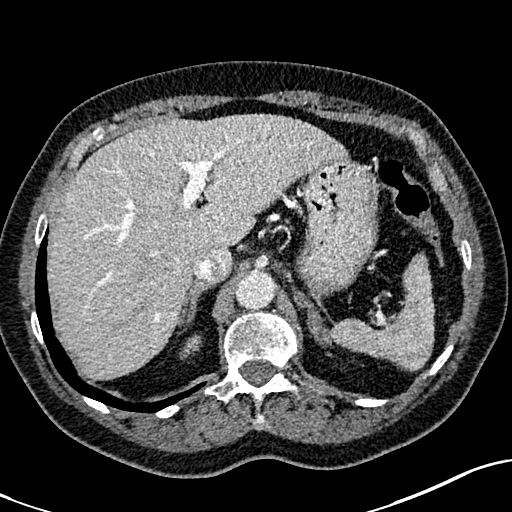
[im 12/160  lung]
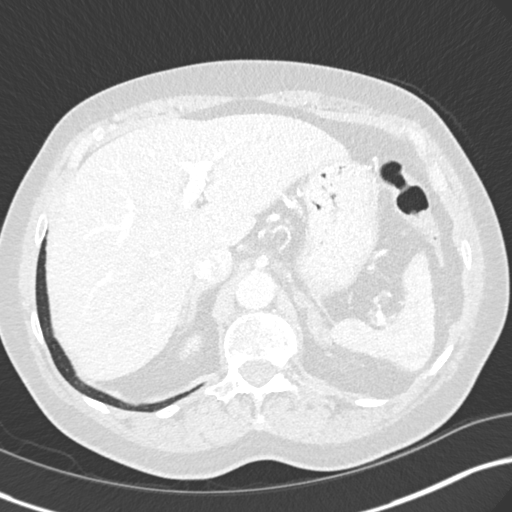
[im 24/160  lung]
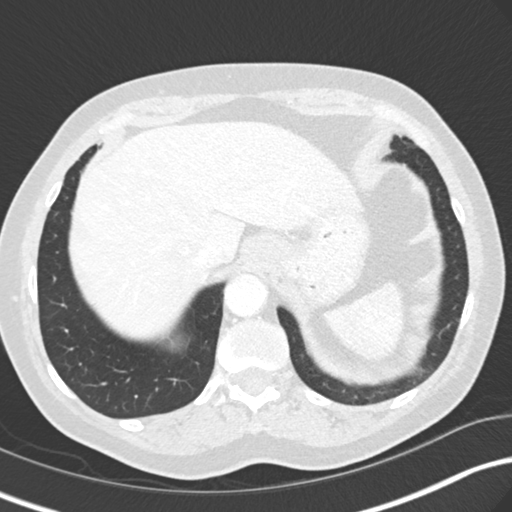
[im 36/160  lung]
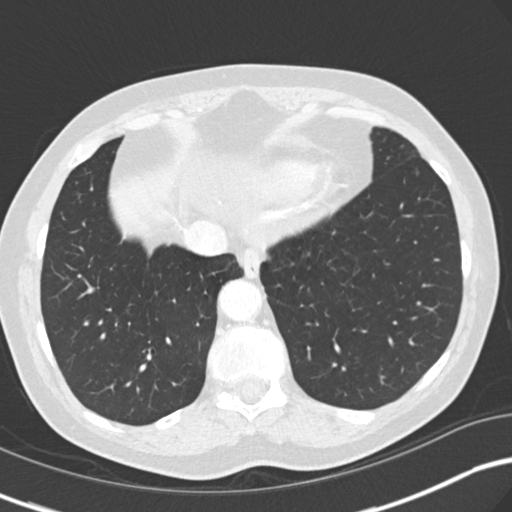
[im 48/160  lung]
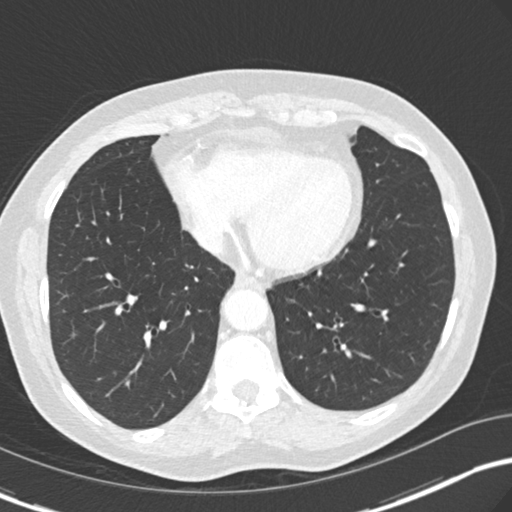
[im 59/160  mediastinal]
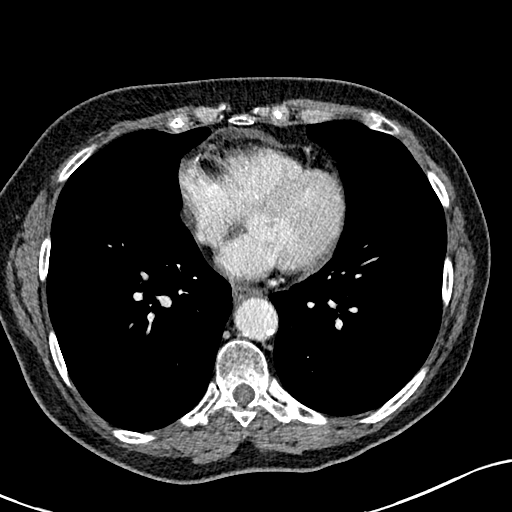
[im 59/160  lung]
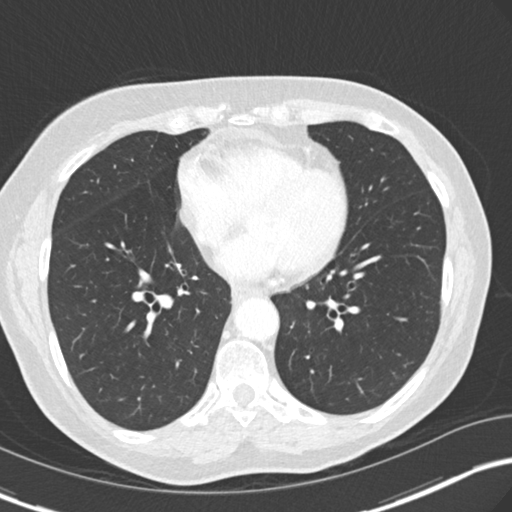
[im 71/160  lung]
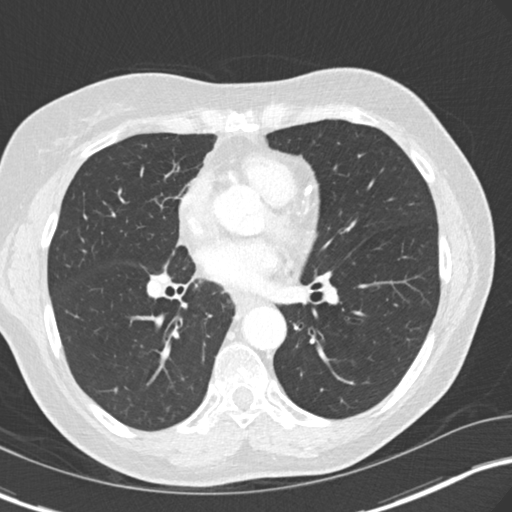
[im 89/160  lung]
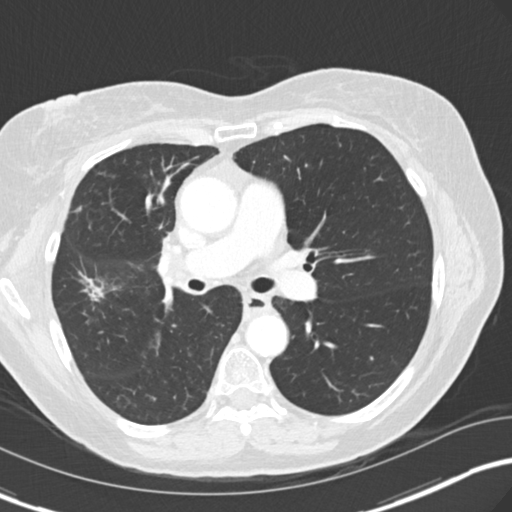
[im 101/160  lung]
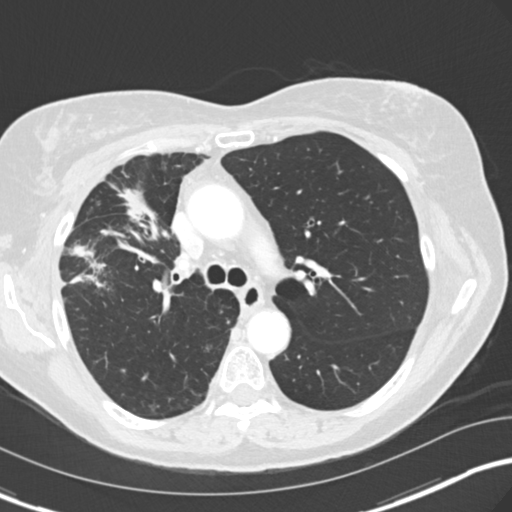
[im 112/160  mediastinal]
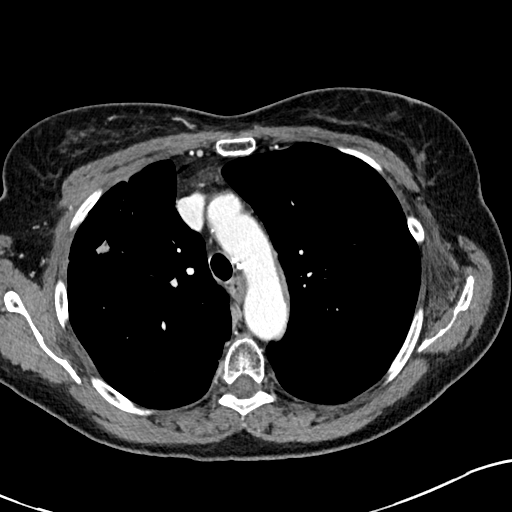
[im 112/160  lung]
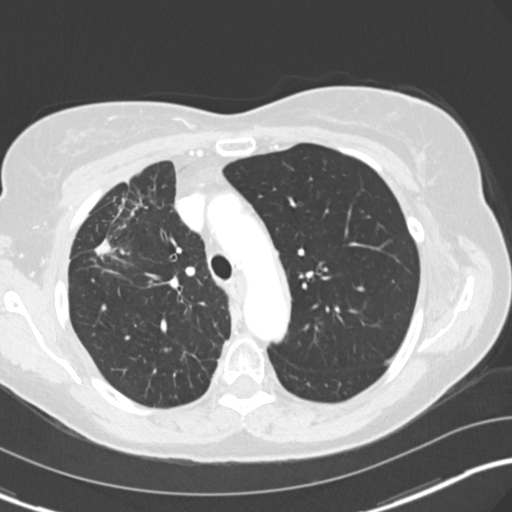
[im 124/160  lung]
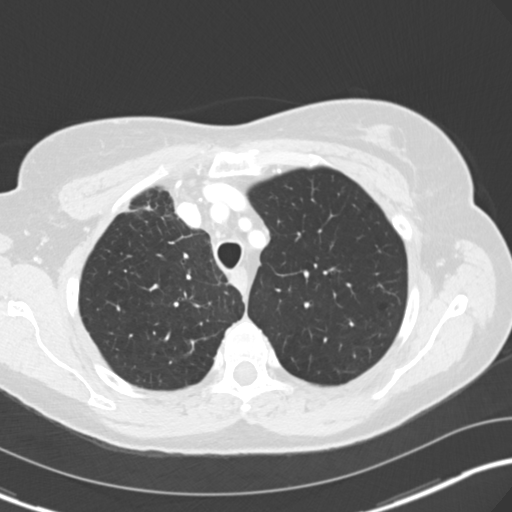
[im 136/160  lung]
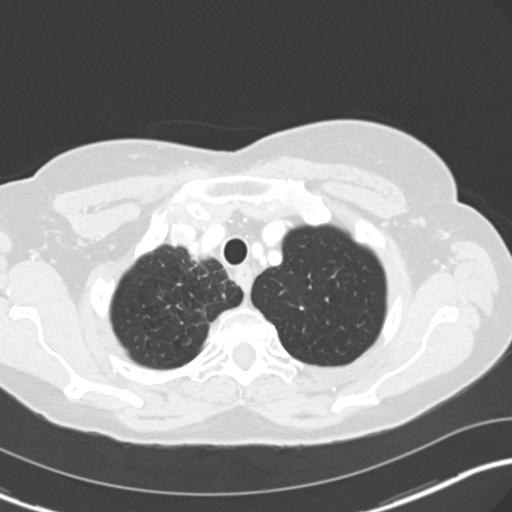
[im 148/160  lung]
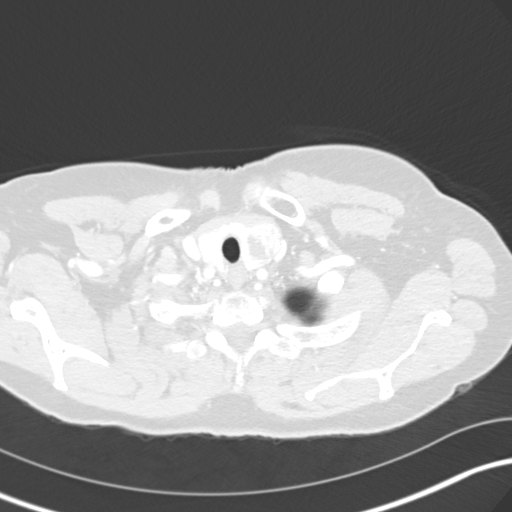

[Series 6: coronal · coronal · 0.66mm/px · 3 of 130 slices shown]
[im 26/130  lung]
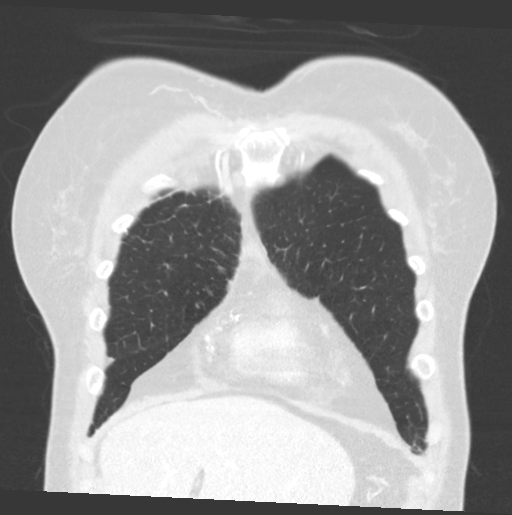
[im 52/130  lung]
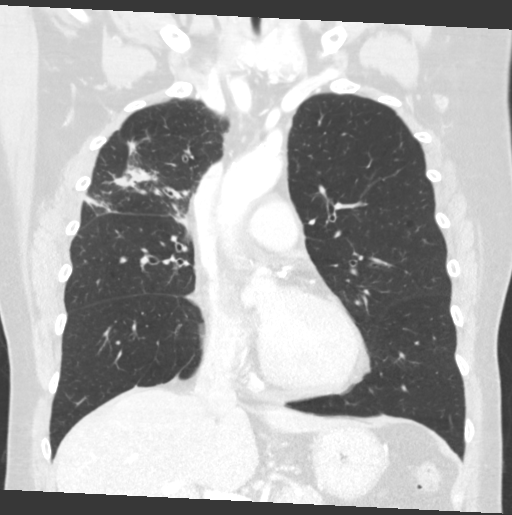
[im 78/130  lung]
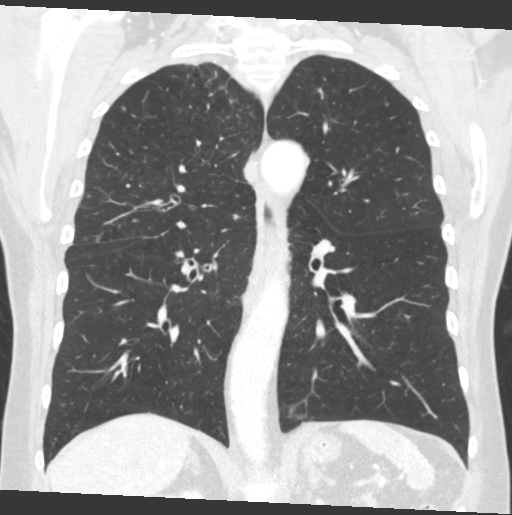

[15 of 36 positions shown; findings below may reference images not displayed]

FINDINGS: Cardiovascular: Small pericardial effusion with a minimal increase
in amount, with a maximum thickness of 14 mm. Normal sized heart.
Atheromatous calcifications, including the coronary arteries and
aorta.

Mediastinum/Nodes: 2.5 cm heterogeneous left lobe thyroid nodule
without significant change. An enlarged right hilar lymph node is
again demonstrated with a short axis diameter of 10 mm on image
number 70 series 2, previously 13 mm in corresponding diameter. No
other enlarged lymph nodes are seen. Mild diffuse esophageal wall
thickening without significant change.

Lungs/Pleura: The previously demonstrated 4.4 x 3.3 cm irregular
right upper lobe mass currently measures 4.7 x 1.7 cm in
corresponding dimensions on image number 55 series 5.

There is a new irregular, contiguous, similar-appearing component
more posteriorly and laterally, measuring 2.8 x 1.6 cm on image
number 56 series 5.

More inferiorly in the right upper lobe is a 2nd irregular part
solid nodule measuring 2.4 x 1.3 cm. The lungs remain hyperexpanded
with diffuse peribronchial thickening. No new abnormalities are seen
on the left. No pleural fluid.

Upper Abdomen: Stable small right lobe liver probable cyst.

Musculoskeletal: Mild thoracic and lower cervical spine degenerative
changes no evidence of bony metastatic disease.
IMPRESSION: 1. Interval mild decrease in size of the previously demonstrated
irregular right upper lobe mass.
2. To interval irregular nodular areas in the right upper lobe
inferior to the primary mass, 1 measuring 2.8 x 1.6 cm and the other
measuring 2.4 x 1.3 cm. These are suspicious for spread of
malignancy.
3. Probable metastatic right hilar lymph node with a mild interval
decrease in size.
4. Stable changes of COPD and chronic bronchitis.
5. Minimal increase in size of a small pericardial effusion.
6. Stable 2.5 cm left lobe thyroid nodule. This could be better
evaluated with thyroid ultrasound. If patient is clinically
hyperthyroid, consider nuclear medicine thyroid uptake and scan.
7. Calcific coronary artery and aortic atherosclerosis.

Aortic Atherosclerosis (ZWNPB-WOS.S) and Emphysema (ZWNPB-KUT.P).

## 2019-12-15 ENCOUNTER — Other Ambulatory Visit: Payer: Self-pay

## 2019-12-15 ENCOUNTER — Ambulatory Visit (INDEPENDENT_AMBULATORY_CARE_PROVIDER_SITE_OTHER): Payer: BC Managed Care – PPO | Admitting: Registered Nurse

## 2019-12-15 VITALS — BP 142/89 | HR 100 | Temp 97.9°F | Ht 68.0 in | Wt 184.0 lb

## 2019-12-15 DIAGNOSIS — M25512 Pain in left shoulder: Secondary | ICD-10-CM

## 2019-12-15 NOTE — Patient Instructions (Signed)
° ° ° °  If you have lab work done today you will be contacted with your lab results within the next 2 weeks.  If you have not heard from us then please contact us. The fastest way to get your results is to register for My Chart. ° ° °IF you received an x-ray today, you will receive an invoice from [facility]. Please contact [facility] at [phone] with questions or concerns regarding your invoice.  ° °IF you received labwork today, you will receive an invoice from [facility]. Please contact [facility] at [phone] with questions or concerns regarding your invoice.  ° °Our billing staff will not be able to assist you with questions regarding bills from these companies. ° °You will be contacted with the lab results as soon as they are available. The fastest way to get your results is to activate your My Chart account. Instructions are located on the last page of this paperwork. If you have not heard from us regarding the results in 2 weeks, please contact this office. °  ° ° ° °

## 2019-12-22 ENCOUNTER — Encounter: Payer: Self-pay | Admitting: Registered Nurse

## 2019-12-22 ENCOUNTER — Other Ambulatory Visit: Payer: Self-pay

## 2019-12-22 ENCOUNTER — Ambulatory Visit: Payer: BC Managed Care – PPO | Admitting: Registered Nurse

## 2019-12-22 VITALS — BP 114/78 | HR 96 | Temp 97.6°F | Ht 68.0 in | Wt 182.4 lb

## 2019-12-22 DIAGNOSIS — M25512 Pain in left shoulder: Secondary | ICD-10-CM

## 2019-12-22 MED ORDER — PREDNISONE 10 MG PO TABS: ORAL_TABLET | ORAL | 0 refills | Status: AC

## 2019-12-22 NOTE — Progress Notes (Signed)
Acute Office Visit  Subjective:    Patient ID: Christina [name], female    DOB: February 21, 1946, 74 y.o.   MRN: [id number]  Chief Complaint  Patient presents with   Shoulder Pain    Pt stated that she has been experiencing some Lt shoulder pain for the past wee and as far as she knows she did not do anything to hurt it. She was taken tylenol to help relieve the pain and it did help.    HPI Patient is in today for l shoulder pain  Has been ongoing for around a week Same as last week - sharp pain, worse with movement, radiates down arm. Has been taking tylenol around the clock and resting shoulder. No relief. Pain has not worsened. No rash or swelling noted. No numbness, burning, or tingling in hand. No neck pain or headache.  Otherwise no concerns.   Past Medical History:  Diagnosis Date   Allergy    GERD (gastroesophageal reflux disease)    Hyperlipidemia    Hypertension    nscl ca dx'd 05/2018   lung cancer   Reflux    Tubulovillous adenoma of colon 2003    Past Surgical History:  Procedure Laterality Date   CESAREAN SECTION     1 time   COLONOSCOPY     VIDEO BRONCHOSCOPY WITH ENDOBRONCHIAL NAVIGATION N/A 06/24/2018   Procedure: VIDEO BRONCHOSCOPY WITH ENDOBRONCHIAL NAVIGATION with biopsies, right upper lung lobe;  Surgeon: Melrose Nakayama, MD;  Location: [facility] OR;  Service: Thoracic;  Laterality: N/A;   VIDEO BRONCHOSCOPY WITH ENDOBRONCHIAL ULTRASOUND N/A 06/24/2018   Procedure: VIDEO BRONCHOSCOPY WITH ENDOBRONCHIAL ULTRASOUND, right upper lung lobe;  Surgeon: Melrose Nakayama, MD;  Location: [facility] OR;  Service: Thoracic;  Laterality: N/A;    Family History  Problem Relation Age of Onset   Hypertension Mother    Heart disease Mother    Leukemia Father    Cancer Father    Hypertension Sister    Hypertension Sister     Social History   Socioeconomic History   Marital status: Married    Spouse name: [name]   Number of children: 1   Years of  education: 12th grade   Highest education level: Not on file  Occupational History   Occupation: school housekeeping    Comment: part-time  Tobacco Use   Smoking status: Former Smoker    Packs/day: 0.50    Years: 50.00    Pack years: 25.00    Types: Cigarettes   Smokeless tobacco: Never Used   Tobacco comment: Previously quit in 2015 but has started smoking again   Vaping Use   Vaping Use: Never used  Substance and Sexual Activity   Alcohol use: No    Alcohol/week: 0.0 standard drinks   Drug use: No   Sexual activity: Never  Other Topics Concern   Not on file  Social History Narrative   Lives with her husband. Her daughter also lives in [place] with her husband and 3 children.   Social Determinants of Health   Financial Resource Strain:    Difficulty of Paying Living Expenses:   Food Insecurity:    Worried About Charity fundraiser in the Last Year:    Arboriculturist in the Last Year:   Transportation Needs:    Film/video editor (Medical):    Lack of Transportation (Non-Medical):   Physical Activity:    Days of Exercise per Week:    Minutes of Exercise per Session:  Stress:    Feeling of Stress :   Social Connections:    Frequency of Communication with Friends and Family:    Frequency of Social Gatherings with Friends and Family:    Attends Religious Services:    Active Member of Clubs or Organizations:    Attends Music therapist:    Marital Status:   Intimate Partner Violence:    Fear of Current or Ex-Partner:    Emotionally Abused:    Physically Abused:    Sexually Abused:     Outpatient Medications Prior to Visit  Medication Sig Dispense Refill   albuterol (VENTOLIN HFA) 108 (90 Base) MCG/ACT inhaler INHALE 2 PUFFS INTO THE LUNGS EVERY 6 HOURS AS NEEDED FOR WHEEZING OR SHORTNESS OF BREATH 6.7 g 1   amLODipine (NORVASC) 10 MG tablet Take 1 tablet (10 mg total) by mouth daily. 90 tablet 1   aspirin EC 81  MG tablet Take 81 mg by mouth daily.     atorvastatin (LIPITOR) 40 MG tablet TAKE 1 TABLET BY MOUTH DAILY 90 tablet 3   EPINEPHrine 0.3 mg/0.3 mL IJ SOAJ injection Inject 0.3 mLs (0.3 mg total) into the muscle as needed for anaphylaxis (for shellfish allergy.). 1 each 1   fluticasone (FLONASE) 50 MCG/ACT nasal spray      HYDROcodone-acetaminophen (HYCET) 7.5-325 mg/15 ml solution Take 15 mLs by mouth 4 (four) times daily as needed. 473 mL 0   omeprazole (PRILOSEC) 40 MG capsule TAKE 1 CAPSULE(40 MG) BY MOUTH DAILY 90 capsule 2   ondansetron (ZOFRAN-ODT) 4 MG disintegrating tablet      potassium chloride (KLOR-CON) 10 MEQ tablet TAKE 1 TABLET(10 MEQ) BY MOUTH TWICE DAILY 60 tablet 6   sucralfate (CARAFATE) 1 g tablet Take 1 tablet (1 g total) by mouth 4 (four) times daily as needed (stomach acid). 120 tablet 6   No facility-administered medications prior to visit.    Allergies  Allergen Reactions   Coconut Oil Hives   Codeine Other (See Comments)    Upsets stomach really bad   Shrimp [Shellfish Allergy] Hives   Trandolapril Swelling    Swelling of the face/lips 07/2014    Review of Systems Per hpi      Objective:    Physical Exam Constitutional:      General: She is not in acute distress.    Appearance: Normal appearance. She is normal weight. She is not ill-appearing, toxic-appearing or diaphoretic.  Cardiovascular:     Rate and Rhythm: Normal rate and regular rhythm.  Musculoskeletal:        General: Tenderness present. No swelling, deformity or signs of injury. Normal range of motion.     Right lower leg: No edema.     Left lower leg: No edema.  Skin:    General: Skin is warm and dry.     Capillary Refill: Capillary refill takes less than 2 seconds.     Coloration: Skin is not jaundiced or pale.     Findings: No bruising, erythema, lesion or rash.  Neurological:     General: No focal deficit present.     Mental Status: She is alert and oriented to person,  place, and time. Mental status is at baseline.  Psychiatric:        Mood and Affect: Mood normal.        Behavior: Behavior normal.        Thought Content: Thought content normal.        Judgment: Judgment normal.  BP (!) 142/89    Pulse 100    Temp 97.9 F (36.6 C) (Temporal)    Ht 5\' 8"  (1.727 m)    Wt 184 lb (83.5 kg)    SpO2 95%    BMI 27.98 kg/m  Wt Readings from Last 3 Encounters:  12/22/19 182 lb 6.4 oz (82.7 kg)  12/15/19 184 lb (83.5 kg)  11/17/19 181 lb (82.1 kg)    Health Maintenance Due  Topic Date Due   MAMMOGRAM  07/11/2018   COLONOSCOPY  04/15/2019    There are no preventive care reminders to display for this patient.   Lab Results  Component Value Date   TSH 1.234 09/13/2019   Lab Results  Component Value Date   WBC 4.9 10/14/2019   HGB 13.6 10/14/2019   HCT 41.9 10/14/2019   MCV 89.1 10/14/2019   PLT 326 10/14/2019   Lab Results  Component Value Date   NA 142 10/14/2019   K 3.5 10/14/2019   CO2 24 10/14/2019   GLUCOSE 111 (H) 10/14/2019   BUN 17 10/14/2019   CREATININE 1.12 (H) 10/14/2019   BILITOT 0.6 10/14/2019   ALKPHOS 109 10/14/2019   AST 17 10/14/2019   ALT 14 10/14/2019   PROT 7.5 10/14/2019   ALBUMIN 3.7 10/14/2019   CALCIUM 9.6 10/14/2019   ANIONGAP 10 10/14/2019   Lab Results  Component Value Date   CHOL 164 11/17/2019   Lab Results  Component Value Date   HDL 47 11/17/2019   Lab Results  Component Value Date   LDLCALC 101 (H) 11/17/2019   Lab Results  Component Value Date   TRIG 83 11/17/2019   Lab Results  Component Value Date   CHOLHDL 3.5 11/17/2019   Lab Results  Component Value Date   HGBA1C 5.6 11/17/2019       Assessment & Plan:   Problem List Items Addressed This Visit    None    Visit Diagnoses    Acute pain of left shoulder    -  Primary   Relevant Medications   predniSONE (DELTASONE) 10 MG tablet       Meds ordered this encounter  Medications   predniSONE (DELTASONE) 10 MG  tablet    Sig: Take 3 tablets (30 mg total) by mouth daily with breakfast for 3 days, THEN 2 tablets (20 mg total) daily with breakfast for 3 days, THEN 1 tablet (10 mg total) daily with breakfast for 3 days.    Dispense:  18 tablet    Refill:  0    Order Specific Question:   Supervising Provider    Answer:   Horald Pollen [[id number]]   PLAN  Prednisone taper  Refer to ortho  Return precautions reviewed  Patient encouraged to call clinic with any questions, comments, or concerns.  Maximiano Coss, NP

## 2019-12-22 NOTE — Progress Notes (Signed)
Acute Office Visit  Subjective:    Patient ID: Christina [name], female    DOB: [birth year], 74 y.o.   MRN: [id number]  Chief Complaint  Patient presents with  . Follow-up    Pt stated that her Lt elbow has not gotten any better and when it does hurt if feels like a tooth ache    HPI Patient is in today for l shoulder pain  Has been ongoing for around a week Same as last week - sharp pain, worse with movement, radiates down arm. Has been taking tylenol around the clock and resting shoulder. No relief. Pain has not worsened. No rash or swelling noted. No numbness, burning, or tingling in hand. No neck pain or headache.  Otherwise no concerns.   Past Medical History:  Diagnosis Date  . Allergy   . GERD (gastroesophageal reflux disease)   . Hyperlipidemia   . Hypertension   . nscl ca dx'd 05/2018   lung cancer  . Reflux   . Tubulovillous adenoma of colon 2003    Past Surgical History:  Procedure Laterality Date  . CESAREAN SECTION     1 time  . COLONOSCOPY    . VIDEO BRONCHOSCOPY WITH ENDOBRONCHIAL NAVIGATION N/A 06/24/2018   Procedure: VIDEO BRONCHOSCOPY WITH ENDOBRONCHIAL NAVIGATION with biopsies, right upper lung lobe;  Surgeon: Melrose Nakayama, MD;  Location: [facility] OR;  Service: Thoracic;  Laterality: N/A;  . VIDEO BRONCHOSCOPY WITH ENDOBRONCHIAL ULTRASOUND N/A 06/24/2018   Procedure: VIDEO BRONCHOSCOPY WITH ENDOBRONCHIAL ULTRASOUND, right upper lung lobe;  Surgeon: Melrose Nakayama, MD;  Location: [facility] OR;  Service: Thoracic;  Laterality: N/A;    Family History  Problem Relation Age of Onset  . Hypertension Mother   . Heart disease Mother   . Leukemia Father   . Cancer Father   . Hypertension Sister   . Hypertension Sister     Social History   Socioeconomic History  . Marital status: Married    Spouse name: [name]  . Number of children: 1  . Years of education: 12th grade  . Highest education level: Not on file  Occupational History  . Occupation:  school housekeeping    Comment: part-time  Tobacco Use  . Smoking status: Former Smoker    Packs/day: 0.50    Years: 50.00    Pack years: 25.00    Types: Cigarettes  . Smokeless tobacco: Never Used  . Tobacco comment: Previously quit in 2015 but has started smoking again   Vaping Use  . Vaping Use: Never used  Substance and Sexual Activity  . Alcohol use: No    Alcohol/week: 0.0 standard drinks  . Drug use: No  . Sexual activity: Never  Other Topics Concern  . Not on file  Social History Narrative   Lives with her husband. Her daughter also lives in [place] with her husband and 3 children.   Social Determinants of Health   Financial Resource Strain:   . Difficulty of Paying Living Expenses:   Food Insecurity:   . Worried About Charity fundraiser in the Last Year:   . Arboriculturist in the Last Year:   Transportation Needs:   . Film/video editor (Medical):   Marland Kitchen Lack of Transportation (Non-Medical):   Physical Activity:   . Days of Exercise per Week:   . Minutes of Exercise per Session:   Stress:   . Feeling of Stress :   Social Connections:   . Frequency of Communication with  Friends and Family:   . Frequency of Social Gatherings with Friends and Family:   . Attends Religious Services:   . Active Member of Clubs or Organizations:   . Attends Archivist Meetings:   Marland Kitchen Marital Status:   Intimate Partner Violence:   . Fear of Current or Ex-Partner:   . Emotionally Abused:   Marland Kitchen Physically Abused:   . Sexually Abused:     Outpatient Medications Prior to Visit  Medication Sig Dispense Refill  . albuterol (VENTOLIN HFA) 108 (90 Base) MCG/ACT inhaler INHALE 2 PUFFS INTO THE LUNGS EVERY 6 HOURS AS NEEDED FOR WHEEZING OR SHORTNESS OF BREATH 6.7 g 1  . amLODipine (NORVASC) 10 MG tablet Take 1 tablet (10 mg total) by mouth daily. 90 tablet 1  . aspirin EC 81 MG tablet Take 81 mg by mouth daily.    Marland Kitchen atorvastatin (LIPITOR) 40 MG tablet TAKE 1 TABLET BY MOUTH  DAILY 90 tablet 3  . EPINEPHrine 0.3 mg/0.3 mL IJ SOAJ injection Inject 0.3 mLs (0.3 mg total) into the muscle as needed for anaphylaxis (for shellfish allergy.). 1 each 1  . fluticasone (FLONASE) 50 MCG/ACT nasal spray     . HYDROcodone-acetaminophen (HYCET) 7.5-325 mg/15 ml solution Take 15 mLs by mouth 4 (four) times daily as needed. 473 mL 0  . omeprazole (PRILOSEC) 40 MG capsule TAKE 1 CAPSULE(40 MG) BY MOUTH DAILY 90 capsule 2  . ondansetron (ZOFRAN-ODT) 4 MG disintegrating tablet     . potassium chloride (KLOR-CON) 10 MEQ tablet TAKE 1 TABLET(10 MEQ) BY MOUTH TWICE DAILY 60 tablet 6  . sucralfate (CARAFATE) 1 g tablet Take 1 tablet (1 g total) by mouth 4 (four) times daily as needed (stomach acid). 120 tablet 6   No facility-administered medications prior to visit.    Allergies  Allergen Reactions  . Coconut Oil Hives  . Codeine Other (See Comments)    Upsets stomach really bad  . Shrimp [Shellfish Allergy] Hives  . Trandolapril Swelling    Swelling of the face/lips 07/2014    Review of Systems Per hpi      Objective:    Physical Exam Constitutional:      General: She is not in acute distress.    Appearance: Normal appearance. She is normal weight. She is not ill-appearing, toxic-appearing or diaphoretic.  Cardiovascular:     Rate and Rhythm: Normal rate and regular rhythm.  Musculoskeletal:        General: Tenderness present. No swelling, deformity or signs of injury. Normal range of motion.     Right lower leg: No edema.     Left lower leg: No edema.  Skin:    General: Skin is warm and dry.     Capillary Refill: Capillary refill takes less than 2 seconds.     Coloration: Skin is not jaundiced or pale.     Findings: No bruising, erythema, lesion or rash.  Neurological:     General: No focal deficit present.     Mental Status: She is alert and oriented to person, place, and time. Mental status is at baseline.  Psychiatric:        Mood and Affect: Mood normal.          Behavior: Behavior normal.        Thought Content: Thought content normal.        Judgment: Judgment normal.     BP 114/78   Pulse 96   Temp 97.6 F (36.4 C) (Temporal)  Ht 5\' 8"  (1.727 m)   Wt 182 lb 6.4 oz (82.7 kg)   SpO2 97%   BMI 27.73 kg/m  Wt Readings from Last 3 Encounters:  12/22/19 182 lb 6.4 oz (82.7 kg)  12/15/19 184 lb (83.5 kg)  11/17/19 181 lb (82.1 kg)    Health Maintenance Due  Topic Date Due  . MAMMOGRAM  07/11/2018  . COLONOSCOPY  04/15/2019    There are no preventive care reminders to display for this patient.   Lab Results  Component Value Date   TSH 1.234 09/13/2019   Lab Results  Component Value Date   WBC 4.9 10/14/2019   HGB 13.6 10/14/2019   HCT 41.9 10/14/2019   MCV 89.1 10/14/2019   PLT 326 10/14/2019   Lab Results  Component Value Date   NA 142 10/14/2019   K 3.5 10/14/2019   CO2 24 10/14/2019   GLUCOSE 111 (H) 10/14/2019   BUN 17 10/14/2019   CREATININE 1.12 (H) 10/14/2019   BILITOT 0.6 10/14/2019   ALKPHOS 109 10/14/2019   AST 17 10/14/2019   ALT 14 10/14/2019   PROT 7.5 10/14/2019   ALBUMIN 3.7 10/14/2019   CALCIUM 9.6 10/14/2019   ANIONGAP 10 10/14/2019   Lab Results  Component Value Date   CHOL 164 11/17/2019   Lab Results  Component Value Date   HDL 47 11/17/2019   Lab Results  Component Value Date   LDLCALC 101 (H) 11/17/2019   Lab Results  Component Value Date   TRIG 83 11/17/2019   Lab Results  Component Value Date   CHOLHDL 3.5 11/17/2019   Lab Results  Component Value Date   HGBA1C 5.6 11/17/2019       Assessment & Plan:   Problem List Items Addressed This Visit    None       No orders of the defined types were placed in this encounter.  PLAN  Prednisone taper  Refer to ortho  Return precautions reviewed  Patient encouraged to call clinic with any questions, comments, or concerns.  Maximiano Coss, NP

## 2019-12-22 NOTE — Patient Instructions (Signed)
° ° ° °  If you have lab work done today you will be contacted with your lab results within the next 2 weeks.  If you have not heard from us then please contact us. The fastest way to get your results is to register for My Chart. ° ° °IF you received an x-ray today, you will receive an invoice from [facility]. Please contact [facility] at [phone] with questions or concerns regarding your invoice.  ° °IF you received labwork today, you will receive an invoice from [facility]. Please contact [facility] at [phone] with questions or concerns regarding your invoice.  ° °Our billing staff will not be able to assist you with questions regarding bills from these companies. ° °You will be contacted with the lab results as soon as they are available. The fastest way to get your results is to activate your My Chart account. Instructions are located on the last page of this paperwork. If you have not heard from us regarding the results in 2 weeks, please contact this office. °  ° ° ° °

## 2019-12-29 ENCOUNTER — Ambulatory Visit (INDEPENDENT_AMBULATORY_CARE_PROVIDER_SITE_OTHER): Payer: BC Managed Care – PPO | Admitting: Orthopedic Surgery

## 2019-12-29 ENCOUNTER — Ambulatory Visit: Payer: Self-pay

## 2019-12-29 DIAGNOSIS — M79602 Pain in left arm: Secondary | ICD-10-CM | POA: Diagnosis not present

## 2019-12-29 DIAGNOSIS — M7552 Bursitis of left shoulder: Secondary | ICD-10-CM

## 2020-01-01 ENCOUNTER — Encounter: Payer: Self-pay | Admitting: Orthopedic Surgery

## 2020-01-01 DIAGNOSIS — M7552 Bursitis of left shoulder: Secondary | ICD-10-CM | POA: Diagnosis not present

## 2020-01-01 MED ORDER — LIDOCAINE HCL 1 % IJ SOLN
5.0000 mL | INTRAMUSCULAR | Status: AC | PRN
  Administered 2020-01-01: 5 mL

## 2020-01-01 MED ORDER — BUPIVACAINE HCL 0.5 % IJ SOLN
9.0000 mL | INTRAMUSCULAR | Status: AC | PRN
  Administered 2020-01-01: 9 mL via INTRA_ARTICULAR

## 2020-01-01 MED ORDER — METHYLPREDNISOLONE ACETATE 40 MG/ML IJ SUSP
40.0000 mg | INTRAMUSCULAR | Status: AC | PRN
  Administered 2020-01-01: 40 mg via INTRA_ARTICULAR

## 2020-01-01 NOTE — Progress Notes (Signed)
Office Visit Note   Patient: Christina [name]           Date of Birth: 1946-01-19           MRN: [id number] Visit Date: 12/29/2019 Requested by: Maximiano Coss, NP [place],  [place] [address] PCP: Wendie Agreste, MD  Subjective: Chief Complaint  Patient presents with  . Left Shoulder - Pain  . Left Elbow - Pain    HPI: Christina [name] is a 74 year old patient with left shoulder and arm pain 1 month duration with no known injury.  She is right-hand dominant.  The pain does not wake her from sleep at night.  She localizes the pain to the deltoid region.  IcyHot helps.  She cannot take anti-inflammatories secondary to blood pressure.  Denies any radicular pain no numbness and tingling no neck pain.  Denies any real loss of passive or active range of motion.  Tylenol also gives her some relief.  She has also tried a Dosepak and muscle relaxer from her primary care provider.              ROS: All systems reviewed are negative as they relate to the chief complaint within the history of present illness.  Patient denies  fevers or chills.   Assessment & Plan: Visit Diagnoses:  1. Left arm pain     Plan: Impression is left shoulder pain which looks like bursitis.  No clinical evidence of frozen shoulder and rotator cuff strength is good.  She also has a symptomatic left elbow olecranon bursitis which does not appear infected.  This is something that we can watch.  Do want to do an injection into that shoulder subacromial space today with return in 4 to 6 weeks if she is not improved.  Could consider further imaging at that time.  Plain radiographs unremarkable today of the shoulder.  Follow-Up Instructions: Return if symptoms worsen or fail to improve.   Orders:  Orders Placed This Encounter  Procedures  . XR Shoulder Left  . XR Elbow 2 Views Left   No orders of the defined types were placed in this encounter.     Procedures: Large Joint Inj: L subacromial bursa on 01/01/2020 10:27  PM Indications: diagnostic evaluation and pain Details: 18 G 1.5 in needle, posterior approach  Arthrogram: No  Medications: 9 mL bupivacaine 0.5 %; 40 mg methylPREDNISolone acetate 40 MG/ML; 5 mL lidocaine 1 % Outcome: tolerated well, no immediate complications Procedure, treatment alternatives, risks and benefits explained, specific risks discussed. Consent was given by the patient. Immediately prior to procedure a time out was called to verify the correct patient, procedure, equipment, support staff and site/side marked as required. Patient was prepped and draped in the usual sterile fashion.       Clinical Data: No additional findings.  Objective: Vital Signs: There were no vitals taken for this visit.  Physical Exam:   Constitutional: Patient appears well-developed HEENT:  Head: Normocephalic Eyes:EOM are normal Neck: Normal range of motion Cardiovascular: Normal rate Pulmonary/chest: Effort normal Neurologic: Patient is alert Skin: Skin is warm Psychiatric: Patient has normal mood and affect    Ortho Exam: Ortho exam demonstrates full active and passive range of motion of the shoulder left and right-hand side.  Neck range of motion is full.  No paresthesias C5-T1.  Radial pulse intact bilaterally.  Elbow has full range of motion.  Slight bursitis of the olecranon bursa on that left elbow.  Impingement signs positive.  No other masses lymphadenopathy or skin changes noted in that left shoulder region.  Specialty Comments:  No specialty comments available.  Imaging: No results found.   PMFS History: Patient Active Problem List   Diagnosis Date Noted  . Encounter for antineoplastic immunotherapy 10/06/2018  . 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 Goals of care, counseling/discussion 07/06/2018  . Encounter for antineoplastic chemotherapy 07/06/2018  . Primary cancer of right upper lobe of lung ([place]) 06/18/2018  . Acute URI  05/25/2018  . Seasonal allergies 10/06/2017  . Thyromegaly 07/01/2017  . Colon polyps 04/02/2017  . Former smoker 09/27/2016  . Cough 04/25/2016  . 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 GERD (gastroesophageal reflux disease) 08/10/2013  . Hyperlipidemia 06/23/2013  . Essential hypertension 07/23/2012   Past Medical History:  Diagnosis Date  . Allergy   . GERD (gastroesophageal reflux disease)   . Hyperlipidemia   . Hypertension   . nscl ca dx'd 05/2018   lung cancer  . Reflux   . Tubulovillous adenoma of colon 2003    Family History  Problem Relation Age of Onset  . Hypertension Mother   . Heart disease Mother   . Leukemia Father   . Cancer Father   . Hypertension Sister   . Hypertension Sister     Past Surgical History:  Procedure Laterality Date  . CESAREAN SECTION     1 time  . COLONOSCOPY    . VIDEO BRONCHOSCOPY WITH ENDOBRONCHIAL NAVIGATION N/A 06/24/2018   Procedure: VIDEO BRONCHOSCOPY WITH ENDOBRONCHIAL NAVIGATION with biopsies, right upper lung lobe;  Surgeon: Melrose Nakayama, MD;  Location: [facility] OR;  Service: Thoracic;  Laterality: N/A;  . VIDEO BRONCHOSCOPY WITH ENDOBRONCHIAL ULTRASOUND N/A 06/24/2018   Procedure: VIDEO BRONCHOSCOPY WITH ENDOBRONCHIAL ULTRASOUND, right upper lung lobe;  Surgeon: Melrose Nakayama, MD;  Location: [place];  Service: Thoracic;  Laterality: N/A;   Social History   Occupational History  . Occupation: school housekeeping    Comment: part-time  Tobacco Use  . Smoking status: Former Smoker    Packs/day: 0.50    Years: 50.00    Pack years: 25.00    Types: Cigarettes  . Smokeless tobacco: Never Used  . Tobacco comment: Previously quit in 2015 but has started smoking again   Vaping Use  . Vaping Use: Never used  Substance and Sexual Activity  . Alcohol use: No    Alcohol/week: 0.0 standard drinks  . Drug use: No  . Sexual activity: Never

## 2020-01-05 ENCOUNTER — Ambulatory Visit (AMBULATORY_SURGERY_CENTER): Payer: Self-pay | Admitting: *Deleted

## 2020-01-05 ENCOUNTER — Other Ambulatory Visit: Payer: Self-pay

## 2020-01-05 VITALS — Ht 68.0 in | Wt 181.0 lb

## 2020-01-05 DIAGNOSIS — Z86010 Personal history of colonic polyps: Secondary | ICD-10-CM

## 2020-01-05 MED ORDER — NA SULFATE-K SULFATE-MG SULF 17.5-3.13-1.6 GM/177ML PO SOLN: 1.0000 | Freq: Once | ORAL | 0 refills | Status: AC

## 2020-01-05 NOTE — Progress Notes (Signed)
No egg or soy allergy known to patient  No issues with past sedation with any surgeries or procedures no intubation problems in the past  No diet pills per patient No home 02 use per patient  No blood thinners per patient  Pt denies issues with constipation  No A fib or A flutter  EMMI video to pt or MyChart  COVID 19 guidelines implemented in [facility] today   Suprep Coupon given to pt in [facility] today   Due to the COVID-19 pandemic we are asking patients to follow these guidelines. Please only bring one care partner. Please be aware that your care partner may wait in the car in the parking lot or if they feel like they will be too hot to wait in the car, they may wait in the lobby on the 4th floor. All care partners are required to wear a mask the entire time (we do not have any that we can provide them), they need to practice social distancing, and we will do a Covid check for all patient's and care partners when you arrive. Also we will check their temperature and your temperature. If the care partner waits in their car they need to stay in the parking lot the entire time and we will call them on their cell phone when the patient is ready for discharge so they can bring the car to the front of the building. Also all patient's will need to wear a mask into building.

## 2020-01-14 ENCOUNTER — Other Ambulatory Visit: Payer: Self-pay

## 2020-01-14 ENCOUNTER — Encounter (HOSPITAL_COMMUNITY): Payer: Self-pay

## 2020-01-14 ENCOUNTER — Inpatient Hospital Stay: Payer: BC Managed Care – PPO | Attending: Internal Medicine

## 2020-01-14 ENCOUNTER — Ambulatory Visit (HOSPITAL_COMMUNITY)
Admission: RE | Admit: 2020-01-14 | Discharge: 2020-01-14 | Disposition: A | Discharge status: Home / Self Care | Payer: BC Managed Care – PPO | Source: Ambulatory Visit | Attending: Internal Medicine | Admitting: Internal Medicine

## 2020-01-14 DIAGNOSIS — C3411 Malignant neoplasm of upper lobe, right bronchus or lung: Secondary | ICD-10-CM | POA: Insufficient documentation

## 2020-01-14 DIAGNOSIS — J439 Emphysema, unspecified: Secondary | ICD-10-CM | POA: Diagnosis not present

## 2020-01-14 DIAGNOSIS — C3490 Malignant neoplasm of unspecified part of unspecified bronchus or lung: Secondary | ICD-10-CM | POA: Diagnosis not present

## 2020-01-14 DIAGNOSIS — J984 Other disorders of lung: Secondary | ICD-10-CM | POA: Diagnosis not present

## 2020-01-14 DIAGNOSIS — I700 Atherosclerosis of aorta: Secondary | ICD-10-CM | POA: Diagnosis not present

## 2020-01-14 LAB — CBC WITH DIFFERENTIAL (CANCER CENTER ONLY)
Abs Immature Granulocytes: 0.04 10*3/uL (ref 0.00–0.07)
Basophils Absolute: 0.1 10*3/uL (ref 0.0–0.1)
Basophils Relative: 1 %
Eosinophils Absolute: 0.1 10*3/uL (ref 0.0–0.5)
Eosinophils Relative: 2 %
HCT: 39.9 % (ref 36.0–46.0)
Hemoglobin: 13 g/dL (ref 12.0–15.0)
Immature Granulocytes: 1 %
Lymphocytes Relative: 22 %
Lymphs Abs: 1.2 10*3/uL (ref 0.7–4.0)
MCH: 29 pg (ref 26.0–34.0)
MCHC: 32.6 g/dL (ref 30.0–36.0)
MCV: 89.1 fL (ref 80.0–100.0)
Monocytes Absolute: 0.4 10*3/uL (ref 0.1–1.0)
Monocytes Relative: 8 %
Neutro Abs: 3.8 10*3/uL (ref 1.7–7.7)
Neutrophils Relative %: 66 %
Platelet Count: 310 10*3/uL (ref 150–400)
RBC: 4.48 MIL/uL (ref 3.87–5.11)
RDW: 15.2 % (ref 11.5–15.5)
WBC Count: 5.6 10*3/uL (ref 4.0–10.5)
nRBC: 0 % (ref 0.0–0.2)

## 2020-01-14 LAB — CMP (CANCER CENTER ONLY)
ALT: 14 U/L (ref 0–44)
AST: 16 U/L (ref 15–41)
Albumin: 3.6 g/dL (ref 3.5–5.0)
Alkaline Phosphatase: 102 U/L (ref 38–126)
Anion gap: 10 (ref 5–15)
BUN: 17 mg/dL (ref 8–23)
CO2: 21 mmol/L — ABNORMAL LOW (ref 22–32)
Calcium: 9.9 mg/dL (ref 8.9–10.3)
Chloride: 110 mmol/L (ref 98–111)
Creatinine: 1.16 mg/dL — ABNORMAL HIGH (ref 0.44–1.00)
GFR, Est AFR Am: 54 mL/min — ABNORMAL LOW (ref >60)
GFR, Estimated: 47 mL/min — ABNORMAL LOW (ref >60)
Glucose, Bld: 86 mg/dL (ref 70–99)
Potassium: 4.2 mmol/L (ref 3.5–5.1)
Sodium: 141 mmol/L (ref 135–145)
Total Bilirubin: 0.5 mg/dL (ref 0.3–1.2)
Total Protein: 7.1 g/dL (ref 6.5–8.1)

## 2020-01-14 MED ORDER — SODIUM CHLORIDE (PF) 0.9 % IJ SOLN
INTRAMUSCULAR | Status: AC
  Filled 2020-01-14: qty 50

## 2020-01-14 MED ORDER — IOHEXOL 300 MG/ML  SOLN
75.0000 mL | Freq: Once | INTRAMUSCULAR | Status: AC | PRN
  Administered 2020-01-14: 75 mL via INTRAVENOUS

## 2020-01-17 ENCOUNTER — Encounter: Payer: Self-pay | Admitting: Internal Medicine

## 2020-01-17 ENCOUNTER — Other Ambulatory Visit: Payer: Self-pay

## 2020-01-17 ENCOUNTER — Inpatient Hospital Stay: Payer: BC Managed Care – PPO | Attending: Internal Medicine | Admitting: Internal Medicine

## 2020-01-17 DIAGNOSIS — J439 Emphysema, unspecified: Secondary | ICD-10-CM | POA: Diagnosis not present

## 2020-01-17 DIAGNOSIS — Z7982 Long term (current) use of aspirin: Secondary | ICD-10-CM | POA: Insufficient documentation

## 2020-01-17 DIAGNOSIS — J984 Other disorders of lung: Secondary | ICD-10-CM | POA: Insufficient documentation

## 2020-01-17 DIAGNOSIS — E785 Hyperlipidemia, unspecified: Secondary | ICD-10-CM | POA: Insufficient documentation

## 2020-01-17 DIAGNOSIS — I10 Essential (primary) hypertension: Secondary | ICD-10-CM | POA: Insufficient documentation

## 2020-01-17 DIAGNOSIS — C3411 Malignant neoplasm of upper lobe, right bronchus or lung: Secondary | ICD-10-CM | POA: Diagnosis not present

## 2020-01-17 DIAGNOSIS — C3490 Malignant neoplasm of unspecified part of unspecified bronchus or lung: Secondary | ICD-10-CM | POA: Diagnosis not present

## 2020-01-17 DIAGNOSIS — Z79899 Other long term (current) drug therapy: Secondary | ICD-10-CM | POA: Diagnosis not present

## 2020-01-17 DIAGNOSIS — E041 Nontoxic single thyroid nodule: Secondary | ICD-10-CM | POA: Insufficient documentation

## 2020-01-17 DIAGNOSIS — K219 Gastro-esophageal reflux disease without esophagitis: Secondary | ICD-10-CM | POA: Insufficient documentation

## 2020-01-17 NOTE — Progress Notes (Signed)
[place] [phone]   [phone]  OFFICE PROGRESS NOTE  Wendie Agreste, MD [place] [id number]  DIAGNOSIS: Stage IIIB (T4, N2, M0) non-small cell lung cancer, poorly differentiated squamous cell carcinoma.  She presented with large right upper lobe lung mass in addition to right hilar and mediastinal lymphadenopathy diagnosed in January 2020.  PRIOR THERAPY:  1) Concurrent chemoradiation with weekly carboplatin for AUC of 2 and paclitaxel 45 mg/M2.  First dose July 13, 2018.  Status post 8 cycles.  Last dose was given August 31, 2018. 2) Consolidation treatment with immunotherapy with Imfinzi 10 mg/KG every 2 weeks.  First dose starts October 13, 2018.  Status post 26 cycles.  CURRENT THERAPY: Observation.  INTERVAL HISTORY: Christina [name] 74 y.o. female returns to the clinic today for 3 months follow-up visit.  The patient is feeling fine today with no concerning complaints.  She denied having any chest pain, shortness of breath, cough or hemoptysis.  She denied having any fever or chills.  She has no nausea, vomiting, diarrhea or constipation.  She is currently on observation.  She had repeat CT scan of the chest performed recently and she is here for evaluation and discussion of her scan results.  MEDICAL HISTORY: Past Medical History:  Diagnosis Date  . Allergy   . GERD (gastroesophageal reflux disease)   . Hyperlipidemia   . Hypertension   . nscl ca dx'd 05/2018   lung cancer  . Reflux   . Tubulovillous adenoma of colon 2003    ALLERGIES:  is allergic to coconut oil, codeine, shrimp [shellfish allergy], and trandolapril.  MEDICATIONS:  Current Outpatient Medications  Medication Sig Dispense Refill  . albuterol (VENTOLIN HFA) 108 (90 Base) MCG/ACT inhaler INHALE 2 PUFFS INTO THE LUNGS EVERY 6 HOURS AS NEEDED FOR WHEEZING OR SHORTNESS OF BREATH 6.7 g 1  . amLODipine (NORVASC) 10 MG tablet Take 1 tablet (10 mg  total) by mouth daily. 90 tablet 1  . aspirin EC 81 MG tablet Take 81 mg by mouth daily.    Marland Kitchen atorvastatin (LIPITOR) 40 MG tablet TAKE 1 TABLET BY MOUTH DAILY 90 tablet 3  . EPINEPHrine 0.3 mg/0.3 mL IJ SOAJ injection Inject 0.3 mLs (0.3 mg total) into the muscle as needed for anaphylaxis (for shellfish allergy.). (Patient not taking: Reported on 01/05/2020) 1 each 1  . fluticasone (FLONASE) 50 MCG/ACT nasal spray     . HYDROcodone-acetaminophen (HYCET) 7.5-325 mg/15 ml solution Take 15 mLs by mouth 4 (four) times daily as needed. 473 mL 0  . omeprazole (PRILOSEC) 40 MG capsule TAKE 1 CAPSULE(40 MG) BY MOUTH DAILY 90 capsule 2  . ondansetron (ZOFRAN-ODT) 4 MG disintegrating tablet     . potassium chloride (KLOR-CON) 10 MEQ tablet TAKE 1 TABLET(10 MEQ) BY MOUTH TWICE DAILY 60 tablet 6  . sucralfate (CARAFATE) 1 g tablet Take 1 tablet (1 g total) by mouth 4 (four) times daily as needed (stomach acid). 120 tablet 6   No current facility-administered medications for this visit.    SURGICAL HISTORY:  Past Surgical History:  Procedure Laterality Date  . CESAREAN SECTION     1 time  . COLONOSCOPY    . POLYPECTOMY    . VIDEO BRONCHOSCOPY WITH ENDOBRONCHIAL NAVIGATION N/A 06/24/2018   Procedure: VIDEO BRONCHOSCOPY WITH ENDOBRONCHIAL NAVIGATION with biopsies, right upper lung lobe;  Surgeon: Melrose Nakayama, MD;  Location: [facility];  Service: Thoracic;  Laterality: N/A;  .  VIDEO BRONCHOSCOPY WITH ENDOBRONCHIAL ULTRASOUND N/A 06/24/2018   Procedure: VIDEO BRONCHOSCOPY WITH ENDOBRONCHIAL ULTRASOUND, right upper lung lobe;  Surgeon: Melrose Nakayama, MD;  Location: [facility];  Service: Thoracic;  Laterality: N/A;    REVIEW OF SYSTEMS:  A comprehensive review of systems was negative.   PHYSICAL EXAMINATION: General appearance: alert, cooperative and no distress Head: Normocephalic, without obvious abnormality, atraumatic Neck: no adenopathy, no JVD, supple, symmetrical, trachea midline and thyroid  not enlarged, symmetric, no tenderness/mass/nodules Lymph nodes: Cervical, supraclavicular, and axillary nodes normal. Resp: clear to auscultation bilaterally Back: symmetric, no curvature. ROM normal. No CVA tenderness. Cardio: regular rate and rhythm, S1, S2 normal, no murmur, click, rub or gallop GI: soft, non-tender; bowel sounds normal; no masses,  no organomegaly Extremities: extremities normal, atraumatic, no cyanosis or edema  ECOG PERFORMANCE STATUS: 0 - Asymptomatic  Blood pressure (!) 133/91, pulse 88, temperature 97.7 F (36.5 C), temperature source Temporal, resp. rate 18, height 5\' 8"  (1.727 m), weight 184 lb 4.8 oz (83.6 kg), SpO2 98 %.  LABORATORY DATA: Lab Results  Component Value Date   WBC 5.6 01/14/2020   HGB 13.0 01/14/2020   HCT 39.9 01/14/2020   MCV 89.1 01/14/2020   PLT 310 01/14/2020      Chemistry      Component Value Date/Time   NA 141 01/14/2020 1154   NA 144 04/02/2017 1435   K 4.2 01/14/2020 1154   CL 110 01/14/2020 1154   CO2 21 (L) 01/14/2020 1154   BUN 17 01/14/2020 1154   BUN 11 04/02/2017 1435   CREATININE 1.16 (H) 01/14/2020 1154   CREATININE 0.87 03/22/2016 0908      Component Value Date/Time   CALCIUM 9.9 01/14/2020 1154   ALKPHOS 102 01/14/2020 1154   AST 16 01/14/2020 1154   ALT 14 01/14/2020 1154   BILITOT 0.5 01/14/2020 1154       RADIOGRAPHIC STUDIES: CT Chest W Contrast  Result Date: 01/15/2020 CLINICAL DATA:  Non-small-cell lung cancer. EXAM: CT CHEST WITH CONTRAST TECHNIQUE: Multidetector CT imaging of the chest was performed during intravenous contrast administration. CONTRAST:  86mL OMNIPAQUE IOHEXOL 300 MG/ML  SOLN COMPARISON:  10/11/2019 FINDINGS: Cardiovascular: The heart size is normal. No substantial pericardial effusion. Coronary artery calcification is evident. Atherosclerotic calcification is noted in the wall of the thoracic aorta. Mediastinum/Nodes: Stable 2.4 cm left thyroid nodule. No mediastinal  lymphadenopathy. There is no hilar lymphadenopathy. The esophagus has normal imaging features. There is no axillary lymphadenopathy. Lungs/Pleura: Centrilobular emphsyema noted. Architectural distortion and scarring in the anterior right lung is similar to prior. The bandlike scarring measured on previous studies has retracted in the interval measuring 1.6 cm in thickness today compared to 3.0 cm previously. 3 mm right lower lobe nodule on 73/7 is stable. No new suspicious nodule or mass. No focal airspace consolidation. Insert no effusions Upper Abdomen: 7 mm low-density lesion in the posterior right liver (153/2) is stable in the interval and unchanged comparing back to 09/25/2018. Nodular thickening of the left adrenal gland is also stable back to 09/25/2018. Musculoskeletal: No worrisome lytic or sclerotic osseous abnormality. IMPRESSION: 1. Interval retraction of the bandlike scarring in the anterior right lung. No new or progressive interval findings to suggest recurrent or metastatic disease. 2. Stable 3 mm right lower lobe pulmonary nodule. Continued attention on follow-up recommended. 3. Stable 2.4 cm left thyroid nodule. No specific imaging follow-up recommended but attention on follow-up CT warranted (ref: J Am Coll Radiol. 2015 Feb;12(2): 143-50). 4. Aortic  Atherosclerosis (ICD10-I70.0) and Emphysema (ICD10-J43.9). Electronically Signed   By: Misty Stanley M.D.   On: 01/15/2020 15:12   XR Elbow 2 Views Left  Result Date: 01/01/2020 AP lateral left elbow reviewed.  Elbow is located.  No acute fracture.  No degenerative changes.  Normal left elbow  XR Shoulder Left  Result Date: 01/01/2020 AP axillary outlet radiographs left shoulder reviewed.  Acromiohumeral distance maintained.  AC joint with minimal to no degenerative changes.  Shoulder is located.  Visualized lung fields clear.   ASSESSMENT AND PLAN: This is a very pleasant 74 years old African-American female recently diagnosed with a stage  IIIB (T4, N2, M0) non-small cell lung cancer, poorly differentiated squamous cell carcinoma presented with right upper lobe lung mass in addition to right hilar and mediastinal lymphadenopathy. The patient underwent concurrent chemoradiation with weekly carboplatin and paclitaxel status post 8 cycles. She has partial response to this treatment. She also completed a course of consolidation treatment with immunotherapy with Imfinzi every 2 weeks status post 26 cycles. The patient is currently on observation and she is feeling fine. She had repeat CT scan of the chest performed recently.  I personally and independently reviewed the scans and discussed the results with the patient today. Her scan showed no concerning findings for disease progression. I recommended for her to continue on observation with repeat CT scan of the chest in 4 months. She was advised to call immediately if she has any concerning symptoms in the interval. The patient was advised to call immediately if she has any other concerning symptoms in the interval. The patient voices understanding of current disease status and treatment options and is in agreement with the current care plan.  All questions were answered. The patient knows to call the clinic with any problems, questions or concerns. We can certainly see the patient much sooner if necessary.  Disclaimer: This note was dictated with voice recognition software. Similar sounding words can inadvertently be transcribed and may not be corrected upon review.

## 2020-01-19 ENCOUNTER — Ambulatory Visit (AMBULATORY_SURGERY_CENTER): Payer: BC Managed Care – PPO | Admitting: Gastroenterology

## 2020-01-19 ENCOUNTER — Encounter: Payer: Self-pay | Admitting: Gastroenterology

## 2020-01-19 ENCOUNTER — Other Ambulatory Visit: Payer: Self-pay

## 2020-01-19 ENCOUNTER — Telehealth: Payer: Self-pay | Admitting: Internal Medicine

## 2020-01-19 VITALS — BP 115/80 | HR 79 | Temp 96.8°F | Resp 20 | Ht 68.0 in | Wt 181.0 lb

## 2020-01-19 DIAGNOSIS — Z86010 Personal history of colonic polyps: Secondary | ICD-10-CM

## 2020-01-19 DIAGNOSIS — Z1211 Encounter for screening for malignant neoplasm of colon: Secondary | ICD-10-CM | POA: Diagnosis not present

## 2020-01-19 MED ORDER — SODIUM CHLORIDE 0.9 % IV SOLN: 500.0000 mL | Freq: Once | INTRAVENOUS | Status: DC

## 2020-01-19 NOTE — Telephone Encounter (Signed)
Scheduled per los. Called and left msg. Mailed printout  °

## 2020-01-19 NOTE — Progress Notes (Signed)
Pt's states no medical or surgical changes since previsit or office visit. 

## 2020-01-19 NOTE — Progress Notes (Signed)
V.S. taken by W.R.

## 2020-01-19 NOTE — Progress Notes (Signed)
To PACU, VSS. Report to [email or web address] 

## 2020-01-19 NOTE — Op Note (Signed)
[facility] Patient Name: Christina [name] Procedure Date: 01/19/2020 9:12 AM MRN: [id number] Endoscopist: Ladene Artist , MD Age: 74 Referring MD:  Date of Birth: 06/13/1946 Gender: Female Account #: [id number] Procedure:                Colonoscopy Indications:              Surveillance: Personal history of adenomatous                            polyps on last colonoscopy > 5 years ago Medicines:                Monitored Anesthesia Care Procedure:                Pre-Anesthesia Assessment:                           - Prior to the procedure, a History and Physical                            was performed, and patient medications and                            allergies were reviewed. The patient's tolerance of                            previous anesthesia was also reviewed. The risks                            and benefits of the procedure and the sedation                            options and risks were discussed with the patient.                            All questions were answered, and informed consent                            was obtained. Prior Anticoagulants: The patient has                            taken no previous anticoagulant or antiplatelet                            agents. ASA Grade Assessment: III - A patient with                            severe systemic disease. After reviewing the risks                            and benefits, the patient was deemed in                            satisfactory condition to undergo the procedure.  After obtaining informed consent, the colonoscope                            was passed under direct vision. Throughout the                            procedure, the patient's blood pressure, pulse, and                            oxygen saturations were monitored continuously. The                            Colonoscope was introduced through the anus and                            advanced to the the  cecum, identified by                            appendiceal orifice and ileocecal valve. The                            ileocecal valve, appendiceal orifice, and rectum                            were photographed. The quality of the bowel                            preparation was good. The colonoscopy was performed                            without difficulty. The patient tolerated the                            procedure well. Scope In: 9:17:59 AM Scope Out: 9:35:33 AM Scope Withdrawal Time: 0 hours 14 minutes 28 seconds  Total Procedure Duration: 0 hours 17 minutes 34 seconds  Findings:                 The perianal and digital rectal examinations were                            normal.                           Internal hemorrhoids were found during                            retroflexion. The hemorrhoids were small and Grade                            I (internal hemorrhoids that do not prolapse).                           The exam was otherwise without abnormality on  direct and retroflexion views. Complications:            No immediate complications. Estimated blood loss:                            None. Estimated Blood Loss:     Estimated blood loss: none. Impression:               - Internal hemorrhoids.                           - The examination was otherwise normal on direct                            and retroflexion views.                           - No specimens collected. Recommendation:           - Repeat colonoscopy in 5 years for surveillance if                            health status appropriate for surveillance.                           - Patient has a contact number available for                            emergencies. The signs and symptoms of potential                            delayed complications were discussed with the                            patient. Return to normal activities tomorrow.                            Written  discharge instructions were provided to the                            patient.                           - Resume previous diet.                           - Continue present medications. Ladene Artist, MD 01/19/2020 9:39:15 AM This report has been signed electronically.

## 2020-01-19 NOTE — Patient Instructions (Signed)
Handouts given : Hemorrhoids Repeat colonoscopy in 5 years Resume previous diet  continue current medications  YOU HAD AN ENDOSCOPIC PROCEDURE TODAY AT [place]:   Refer to the procedure report that was given to you for any specific questions about what was found during the examination.  If the procedure report does not answer your questions, please call your gastroenterologist to clarify.  If you requested that your care partner not be given the details of your procedure findings, then the procedure report has been included in a sealed envelope for you to review at your convenience later.  YOU SHOULD EXPECT: Some feelings of bloating in the abdomen. Passage of more gas than usual.  Walking can help get rid of the air that was put into your GI tract during the procedure and reduce the bloating. If you had a lower endoscopy (such as a colonoscopy or flexible sigmoidoscopy) you may notice spotting of blood in your stool or on the toilet paper. If you underwent a bowel prep for your procedure, you may not have a normal bowel movement for a few days.  Please Note:  You might notice some irritation and congestion in your nose or some drainage.  This is from the oxygen used during your procedure.  There is no need for concern and it should clear up in a day or so.  SYMPTOMS TO REPORT IMMEDIATELY:   Following lower endoscopy (colonoscopy or flexible sigmoidoscopy):  Excessive amounts of blood in the stool  Significant tenderness or worsening of abdominal pains  Swelling of the abdomen that is new, acute  Fever of 100F or higher  F For urgent or emergent issues, a gastroenterologist can be reached at any hour by calling [phone]. Do not use MyChart messaging for urgent concerns.    DIET:  We do recommend a small meal at first, but then you may proceed to your regular diet.  Drink plenty of fluids but you should avoid alcoholic beverages for 24 hours.  ACTIVITY:  You  should plan to take it easy for the rest of today and you should NOT DRIVE or use heavy machinery until tomorrow (because of the sedation medicines used during the test).    FOLLOW UP: Our staff will call the number listed on your records 48-72 hours following your procedure to check on you and address any questions or concerns that you may have regarding the information given to you following your procedure. If we do not reach you, we will leave a message.  We will attempt to reach you two times.  During this call, we will ask if you have developed any symptoms of COVID 19. If you develop any symptoms (ie: fever, flu-like symptoms, shortness of breath, cough etc.) before then, please call [phone].  If you test positive for Covid 19 in the 2 weeks post procedure, please call and report this information to [place].    If any biopsies were taken you will be contacted by phone or by letter within the next 1-3 weeks.  Please call us at [phone] if you have not heard about the biopsies in 3 weeks.    SIGNATURES/CONFIDENTIALITY: You and/or your care partner have signed paperwork which will be entered into your electronic medical record.  These signatures attest to the fact that that the information above on your After Visit Summary has been reviewed and is understood.  Full responsibility of the confidentiality of this discharge information lies with you and/or your care-partner.

## 2020-01-21 ENCOUNTER — Telehealth: Payer: Self-pay | Admitting: *Deleted

## 2020-01-21 NOTE — Telephone Encounter (Signed)
  Follow up Call-  Call back number 01/19/2020  Post procedure Call Back phone  # [phone]  Permission to leave phone message Yes  Some recent data might be hidden     Patient questions:  Do you have a fever, pain , or abdominal swelling? No. Pain Score  0 *  Have you tolerated food without any problems? Yes.    Have you been able to return to your normal activities? Yes.    Do you have any questions about your discharge instructions: Diet   No. Medications  No. Follow up visit  No.  Do you have questions or concerns about your Care? No.  Actions: * If pain score is 4 or above: 1. No action needed, pain <4.Have you developed a fever since your procedure? no  2.   Have you had an respiratory symptoms (SOB or cough) since your procedure? no  3.   Have you tested positive for COVID 19 since your procedure no  4.   Have you had any family members/close contacts diagnosed with the COVID 19 since your procedure?  no   If yes to any of these questions please route to Joylene John, RN and Erenest Rasher, RN

## 2020-04-17 ENCOUNTER — Other Ambulatory Visit: Payer: Self-pay | Admitting: Family Medicine

## 2020-04-17 DIAGNOSIS — R062 Wheezing: Secondary | ICD-10-CM

## 2020-04-18 ENCOUNTER — Other Ambulatory Visit: Payer: Self-pay

## 2020-04-18 DIAGNOSIS — Z5181 Encounter for therapeutic drug level monitoring: Secondary | ICD-10-CM

## 2020-04-18 MED ORDER — ATORVASTATIN CALCIUM 40 MG PO TABS: ORAL_TABLET | ORAL | 0 refills | Status: DC

## 2020-05-16 ENCOUNTER — Inpatient Hospital Stay: Payer: BC Managed Care – PPO | Attending: Internal Medicine

## 2020-05-16 ENCOUNTER — Other Ambulatory Visit: Payer: Self-pay

## 2020-05-16 DIAGNOSIS — C3411 Malignant neoplasm of upper lobe, right bronchus or lung: Secondary | ICD-10-CM | POA: Diagnosis not present

## 2020-05-16 DIAGNOSIS — C3490 Malignant neoplasm of unspecified part of unspecified bronchus or lung: Secondary | ICD-10-CM

## 2020-05-16 LAB — CBC WITH DIFFERENTIAL (CANCER CENTER ONLY)
Abs Immature Granulocytes: 0.02 10*3/uL (ref 0.00–0.07)
Basophils Absolute: 0 10*3/uL (ref 0.0–0.1)
Basophils Relative: 1 %
Eosinophils Absolute: 0.2 10*3/uL (ref 0.0–0.5)
Eosinophils Relative: 3 %
HCT: 39.5 % (ref 36.0–46.0)
Hemoglobin: 13 g/dL (ref 12.0–15.0)
Immature Granulocytes: 0 %
Lymphocytes Relative: 25 %
Lymphs Abs: 1.3 10*3/uL (ref 0.7–4.0)
MCH: 28.7 pg (ref 26.0–34.0)
MCHC: 32.9 g/dL (ref 30.0–36.0)
MCV: 87.2 fL (ref 80.0–100.0)
Monocytes Absolute: 0.5 10*3/uL (ref 0.1–1.0)
Monocytes Relative: 9 %
Neutro Abs: 3.2 10*3/uL (ref 1.7–7.7)
Neutrophils Relative %: 62 %
Platelet Count: 310 10*3/uL (ref 150–400)
RBC: 4.53 MIL/uL (ref 3.87–5.11)
RDW: 15.2 % (ref 11.5–15.5)
WBC Count: 5.1 10*3/uL (ref 4.0–10.5)
nRBC: 0 % (ref 0.0–0.2)

## 2020-05-16 LAB — CMP (CANCER CENTER ONLY)
ALT: 12 U/L (ref 0–44)
AST: 17 U/L (ref 15–41)
Albumin: 3.5 g/dL (ref 3.5–5.0)
Alkaline Phosphatase: 90 U/L (ref 38–126)
Anion gap: 9 (ref 5–15)
BUN: 18 mg/dL (ref 8–23)
CO2: 25 mmol/L (ref 22–32)
Calcium: 9.6 mg/dL (ref 8.9–10.3)
Chloride: 107 mmol/L (ref 98–111)
Creatinine: 1 mg/dL (ref 0.44–1.00)
GFR, Estimated: 60 mL/min — ABNORMAL LOW (ref >60)
Glucose, Bld: 111 mg/dL — ABNORMAL HIGH (ref 70–99)
Potassium: 3.5 mmol/L (ref 3.5–5.1)
Sodium: 141 mmol/L (ref 135–145)
Total Bilirubin: 0.4 mg/dL (ref 0.3–1.2)
Total Protein: 6.9 g/dL (ref 6.5–8.1)

## 2020-05-17 ENCOUNTER — Encounter: Payer: Self-pay | Admitting: Family Medicine

## 2020-05-17 ENCOUNTER — Ambulatory Visit (INDEPENDENT_AMBULATORY_CARE_PROVIDER_SITE_OTHER): Payer: BC Managed Care – PPO | Admitting: Family Medicine

## 2020-05-17 VITALS — BP 131/88 | HR 95 | Temp 98.0°F | Ht 68.0 in | Wt 181.0 lb

## 2020-05-17 DIAGNOSIS — Z0001 Encounter for general adult medical examination with abnormal findings: Secondary | ICD-10-CM | POA: Diagnosis not present

## 2020-05-17 DIAGNOSIS — C3491 Malignant neoplasm of unspecified part of right bronchus or lung: Secondary | ICD-10-CM | POA: Diagnosis not present

## 2020-05-17 DIAGNOSIS — R739 Hyperglycemia, unspecified: Secondary | ICD-10-CM

## 2020-05-17 DIAGNOSIS — I10 Essential (primary) hypertension: Secondary | ICD-10-CM

## 2020-05-17 DIAGNOSIS — R062 Wheezing: Secondary | ICD-10-CM | POA: Diagnosis not present

## 2020-05-17 DIAGNOSIS — K219 Gastro-esophageal reflux disease without esophagitis: Secondary | ICD-10-CM

## 2020-05-17 DIAGNOSIS — E785 Hyperlipidemia, unspecified: Secondary | ICD-10-CM

## 2020-05-17 DIAGNOSIS — Z0000 Encounter for general adult medical examination without abnormal findings: Secondary | ICD-10-CM

## 2020-05-17 DIAGNOSIS — Z5181 Encounter for therapeutic drug level monitoring: Secondary | ICD-10-CM

## 2020-05-17 LAB — LIPID PANEL
Chol/HDL Ratio: 3.8 ratio (ref 0.0–4.4)
Cholesterol, Total: 174 mg/dL (ref 100–199)
HDL: 46 mg/dL (ref >39)
LDL Chol Calc (NIH): 109 mg/dL — ABNORMAL HIGH (ref 0–99)
Triglycerides: 105 mg/dL (ref 0–149)
VLDL Cholesterol Cal: 19 mg/dL (ref 5–40)

## 2020-05-17 LAB — HEMOGLOBIN A1C
Est. average glucose Bld gHb Est-mCnc: 123 mg/dL
Hgb A1c MFr Bld: 5.9 % — ABNORMAL HIGH (ref 4.8–5.6)

## 2020-05-17 MED ORDER — ALBUTEROL SULFATE HFA 108 (90 BASE) MCG/ACT IN AERS: INHALATION_SPRAY | RESPIRATORY_TRACT | 1 refills | Status: DC

## 2020-05-17 MED ORDER — SUCRALFATE 1 G PO TABS: 1.0000 g | ORAL_TABLET | Freq: Four times a day (QID) | ORAL | 6 refills | Status: DC | PRN

## 2020-05-17 MED ORDER — AMLODIPINE BESYLATE 10 MG PO TABS: 10.0000 mg | ORAL_TABLET | Freq: Every day | ORAL | 1 refills | Status: DC

## 2020-05-17 MED ORDER — OMEPRAZOLE 40 MG PO CPDR: DELAYED_RELEASE_CAPSULE | ORAL | 2 refills | Status: DC

## 2020-05-17 MED ORDER — ATORVASTATIN CALCIUM 40 MG PO TABS: ORAL_TABLET | ORAL | 2 refills | Status: DC

## 2020-05-17 MED ORDER — POTASSIUM CHLORIDE CRYS ER 10 MEQ PO TBCR: EXTENDED_RELEASE_TABLET | ORAL | 6 refills | Status: DC

## 2020-05-17 NOTE — Progress Notes (Signed)
Subjective:  Patient ID: Christina [name], female    DOB: 1946-01-22  Age: 74 y.o. MRN: [id number]  CC:  Chief Complaint  Patient presents with  . Annual Exam    pt reports she feels well with no complaints at this time.    HPI Christina [name] presents for  Physical/annual wellness exam. New patient to me in June, previous patient of Dr. Nolon Rod. History of lung cancer, asthma versus COPD, hypertension, hyperlipidemia, hyperglycemia.   Care team: PCP, me Oncology, Dr. Julien Nordmann [facility]astro[facility], Dr. Fuller Plan, Colonoscopy 01/19/2020.  Internal hemorrhoids, repeat in 5 years   Non-small cell lung cancer: Appointment with oncology August 2nd.  Previous chemoradiation.  Currently on observation.  No signs for disease progression at most recent CT. repeat CT 4 months planned. Has albuterol as needed for possible asthma versus COPD with episodic wheeze, quit smoking 2 years ago, prior 40-pack-year history. Repeat CT planned tomorrow. Albuterol up to once per week - working well.   Hyperglycemia Glucose 111 on recent labs at oncology. Was not fasting.  Wt Readings from Last 3 Encounters:  05/17/20 181 lb (82.1 kg)  01/19/20 181 lb (82.1 kg)  01/17/20 184 lb 4.8 oz (83.6 kg)   Lab Results  Component Value Date   HGBA1C 5.6 11/17/2019   GERD: prilosec QD, carafate only if needed.   Hypertension: Amlodipine 10 mg daily, potassium 10 mEq twice daily. Home readings: 128/80.  No new side effects with meds . BP Readings from Last 3 Encounters:  05/17/20 131/88  01/19/20 115/80  01/17/20 (!) 133/91   Lab Results  Component Value Date   CREATININE 1.00 05/16/2020   Hyperlipidemia: Lipitor 40 mg daily. No new myalgias. Fasting today.  Lab Results  Component Value Date   CHOL 164 11/17/2019   HDL 47 11/17/2019   LDLCALC 101 (H) 11/17/2019   TRIG 83 11/17/2019   CHOLHDL 3.5 11/17/2019   Lab Results  Component Value Date   ALT 12 05/16/2020   AST 17 05/16/2020   ALKPHOS 90  05/16/2020   BILITOT 0.4 05/16/2020   Fall Risk  05/17/2020 12/22/2019 12/15/2019 11/17/2019 05/19/2019  Falls in the past year? 0 0 0 0 0  Number falls in past yr: - 0 0 - 0  Injury with Fall? - 0 0 - 0  Follow up Falls evaluation completed Falls evaluation completed Falls evaluation completed Falls evaluation completed -  Lighting in home - adequate Stairs- none Loose rugs - none Grab bars in bathroom - none.   Depression screen [facility] 2/9 05/17/2020 12/22/2019 12/15/2019 11/17/2019 05/19/2019  Decreased Interest 0 0 0 0 0  Down, Depressed, Hopeless 0 0 0 0 0  PHQ - 2 Score 0 0 0 0 0  Some recent data might be hidden   Cancer screening: Colonoscopy as above.  Mammogram: due - she will schedule [facility].    Immunization History  Administered Date(s) Administered  . Influenza,inj,Quad PF,6+ Mos 02/23/2013, 02/14/2014, 03/22/2016, 04/02/2017  . PFIZER SARS-COV-2 Vaccination 07/31/2019, 08/23/2019, 04/25/2020  . Pneumococcal Conjugate-13 08/14/2015  . Pneumococcal Polysaccharide-23 02/14/2014  . Tdap 04/02/2017  covid vaccine booster: few weeks ago. 04/25/20.  Shingles vaccine: not interested.  Declines flu vaccine.   Functional Status Survey: Is the patient deaf or have difficulty hearing?: No Does the patient have difficulty seeing, even when wearing glasses/contacts?: No Does the patient have difficulty concentrating, remembering, or making decisions?: No Does the patient have difficulty walking or climbing stairs?: No Does the patient have difficulty  dressing or bathing?: No Does the patient have difficulty doing errands alone such as visiting a doctor's office or shopping?: No  6CIT Screen 05/17/2020  What Year? 0 points  What month? 0 points  What time? 0 points  Count back from 20 2 points  Months in reverse 0 points  Repeat phrase 0 points  Total Score 2      Hearing Screening   125Hz  250Hz  500Hz  1000Hz  2000Hz  3000Hz  4000Hz  6000Hz  8000Hz   Right ear:           Left ear:              Visual Acuity Screening   Right eye Left eye Both eyes  Without correction: 20/40 20/40 20/30   With correction:      Glasses to drive, no recent optho visit - recommended.    Office Visit from 05/17/2020 in Primary Care at [facility]  AUDIT-C Score 0     Dental: usually 6 months.   Exercise: walking daily - 1hour.   Advanced directives: none. Requests info.    History Patient Active Problem List   Diagnosis Date Noted  . Encounter for antineoplastic immunotherapy 10/06/2018  . Stage III squamous cell carcinoma of right lung ([place]) 07/06/2018  . Goals of care, counseling/discussion 07/06/2018  . Encounter for antineoplastic chemotherapy 07/06/2018  . Primary cancer of right upper lobe of lung ([place]) 06/18/2018  . Acute URI 05/25/2018  . Seasonal allergies 10/06/2017  . Thyromegaly 07/01/2017  . Colon polyps 04/02/2017  . Former smoker 09/27/2016  . Cough 04/25/2016  . 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 GERD (gastroesophageal reflux disease) 08/10/2013  . Hyperlipidemia 06/23/2013  . Essential hypertension 07/23/2012   Past Medical History:  Diagnosis Date  . Allergy   . GERD (gastroesophageal reflux disease)   . Hyperlipidemia   . Hypertension   . nscl ca dx'd 05/2018   lung cancer  . Reflux   . Tubulovillous adenoma of colon 2003   Past Surgical History:  Procedure Laterality Date  . CESAREAN SECTION     1 time  . COLONOSCOPY    . POLYPECTOMY    . VIDEO BRONCHOSCOPY WITH ENDOBRONCHIAL NAVIGATION N/A 06/24/2018   Procedure: VIDEO BRONCHOSCOPY WITH ENDOBRONCHIAL NAVIGATION with biopsies, right upper lung lobe;  Surgeon: Melrose Nakayama, MD;  Location: [facility] OR;  Service: Thoracic;  Laterality: N/A;  . VIDEO BRONCHOSCOPY WITH ENDOBRONCHIAL ULTRASOUND N/A 06/24/2018   Procedure: VIDEO BRONCHOSCOPY WITH ENDOBRONCHIAL ULTRASOUND, right upper lung lobe;  Surgeon: Melrose Nakayama, MD;  Location: [facility] OR;  Service: Thoracic;  Laterality: N/A;    Allergies  Allergen Reactions  . Coconut Oil Hives  . Codeine Other (See Comments)    Upsets stomach really bad  . Shrimp [Shellfish Allergy] Hives  . Trandolapril Swelling    Swelling of the face/lips 07/2014   Prior to Admission medications   Medication Sig Start Date End Date Taking? Authorizing Provider  albuterol (VENTOLIN HFA) 108 (90 Base) MCG/ACT inhaler INHALE 2 PUFFS INTO THE LUNGS EVERY 6 HOURS AS NEEDED FOR WHEEZING OR SHORTNESS OF BREATH 04/17/20  Yes Wendie Agreste, MD  amLODipine (NORVASC) 10 MG tablet Take 1 tablet (10 mg total) by mouth daily. 11/17/19  Yes Wendie Agreste, MD  aspirin EC 81 MG tablet Take 81 mg by mouth daily.   Yes [provider]  atorvastatin (LIPITOR) 40 MG tablet TAKE 1 TABLET BY MOUTH DAILY 04/18/20  Yes Maximiano Coss, NP  HYDROcodone-acetaminophen (HYCET) 7.5-325 mg/15 ml solution Take  15 mLs by mouth 4 (four) times daily as needed. 08/21/18  Yes Kyung Rudd, MD  omeprazole (PRILOSEC) 40 MG capsule TAKE 1 CAPSULE(40 MG) BY MOUTH DAILY 09/15/19  Yes Stallings, Zoe A, MD  potassium chloride (KLOR-CON) 10 MEQ tablet TAKE 1 TABLET(10 MEQ) BY MOUTH TWICE DAILY 11/17/19  Yes Wendie Agreste, MD  sucralfate (CARAFATE) 1 g tablet Take 1 tablet (1 g total) by mouth 4 (four) times daily as needed (stomach acid). 06/29/19  Yes Stallings, Zoe A, MD  EPINEPHrine 0.3 mg/0.3 mL IJ SOAJ injection Inject 0.3 mLs (0.3 mg total) into the muscle as needed for anaphylaxis (for shellfish allergy.). Patient not taking: Reported on 01/05/2020 11/17/19   Wendie Agreste, MD  fluticasone Asencion Islam) 50 MCG/ACT nasal spray  07/05/19   [provider]  ondansetron (ZOFRAN-ODT) 4 MG disintegrating tablet  07/05/19   [provider]   Social History   Socioeconomic History  . Marital status: Married    Spouse name: [name]  . Number of children: 1  . Years of education: 12th grade  . Highest education level: Not on file  Occupational History  .  Occupation: school housekeeping    Comment: part-time  Tobacco Use  . Smoking status: Former Smoker    Packs/day: 0.50    Years: 50.00    Pack years: 25.00    Types: Cigarettes  . Smokeless tobacco: Never Used  . Tobacco comment: Previously quit in 2015 but has started smoking again   Vaping Use  . Vaping Use: Never used  Substance and Sexual Activity  . Alcohol use: No    Alcohol/week: 0.0 standard drinks  . Drug use: No  . Sexual activity: Not Currently  Other Topics Concern  . Not on file  Social History Narrative   Lives with her husband. Her daughter also lives in [place] with her husband and 3 children.   Social Determinants of Health   Financial Resource Strain:   . Difficulty of Paying Living Expenses: Not on file  Food Insecurity:   . Worried About Charity fundraiser in the Last Year: Not on file  . Ran Out of Food in the Last Year: Not on file  Transportation Needs:   . Lack of Transportation (Medical): Not on file  . Lack of Transportation (Non-Medical): Not on file  Physical Activity:   . Days of Exercise per Week: Not on file  . Minutes of Exercise per Session: Not on file  Stress:   . Feeling of Stress : Not on file  Social Connections:   . Frequency of Communication with Friends and Family: Not on file  . Frequency of Social Gatherings with Friends and Family: Not on file  . Attends Religious Services: Not on file  . Active Member of Clubs or Organizations: Not on file  . Attends Archivist Meetings: Not on file  . Marital Status: Not on file  Intimate Partner Violence:   . Fear of Current or Ex-Partner: Not on file  . Emotionally Abused: Not on file  . Physically Abused: Not on file  . Sexually Abused: Not on file    Review of Systems 13 point review of systems per patient health survey noted.  Negative other than as indicated above or in HPI.    Objective:   Vitals:   05/17/20 0808  BP: 131/88  Pulse: 95  Temp: 98 F (36.7  C)  TempSrc: Temporal  SpO2: 96%  Weight: 181 lb (82.1 kg)  Height: 5\' 8"  (1.727 m)     Physical Exam Constitutional:      Appearance: She is well-developed.  HENT:     Head: Normocephalic and atraumatic.     Right Ear: External ear normal.     Left Ear: External ear normal.  Eyes:     Conjunctiva/sclera: Conjunctivae normal.     Pupils: Pupils are equal, round, and reactive to light.  Neck:     Thyroid: No thyromegaly.  Cardiovascular:     Rate and Rhythm: Normal rate and regular rhythm.     Heart sounds: Normal heart sounds. No murmur heard.   Pulmonary:     Effort: Pulmonary effort is normal. No respiratory distress.     Breath sounds: Normal breath sounds. No wheezing.  Abdominal:     General: Bowel sounds are normal.     Palpations: Abdomen is soft.     Tenderness: There is no abdominal tenderness.  Musculoskeletal:        General: No tenderness. Normal range of motion.     Cervical back: Normal range of motion and neck supple.  Lymphadenopathy:     Cervical: No cervical adenopathy.  Skin:    General: Skin is warm and dry.     Findings: No rash.  Neurological:     Mental Status: She is alert and oriented to person, place, and time.  Psychiatric:        Behavior: Behavior normal.        Thought Content: Thought content normal.     Assessment & Plan:  Christina [name] is a 74 y.o. female . Annual physical exam  - -anticipatory guidance as below in AVS, screening labs above. Health maintenance items as above in HPI discussed/recommended as applicable.   Stage III squamous cell carcinoma of right lung (HCC)  -Stable on previous imaging, has follow-up imaging planned tomorrow, continue follow-up with oncology.  Wheezing  -Stable with rare/intermittent use of albuterol, continue same.  RTC precautions Hyperglycemia  Essential hypertension  -  Stable, tolerating current regimen. Medications refilled. Labs reviewed from yesterday.  Hyperlipidemia,  unspecified hyperlipidemia type  -Tolerating statin, check lipid panel  Check A1c for hyperglycemia but borderline A1c 6 months ago.  Nonfasting blood work yesterday.  Meds ordered this encounter  Medications  . atorvastatin (LIPITOR) 40 MG tablet    Sig: TAKE 1 TABLET BY MOUTH DAILY    Dispense:  90 tablet    Refill:  2  . albuterol (VENTOLIN HFA) 108 (90 Base) MCG/ACT inhaler    Sig: INHALE 1-2 PUFFS INTO THE LUNGS EVERY 6 HOURS AS NEEDED FOR WHEEZING OR SHORTNESS OF BREATH    Dispense:  6.7 g    Refill:  1  . omeprazole (PRILOSEC) 40 MG capsule    Sig: TAKE 1 CAPSULE(40 MG) BY MOUTH DAILY    Dispense:  90 capsule    Refill:  2  . potassium chloride (KLOR-CON) 10 MEQ tablet    Sig: TAKE 1 TABLET(10 MEQ) BY MOUTH TWICE DAILY    Dispense:  60 tablet    Refill:  6  . sucralfate (CARAFATE) 1 g tablet    Sig: Take 1 tablet (1 g total) by mouth 4 (four) times daily as needed (stomach acid).    Dispense:  120 tablet    Refill:  6  . amLODipine (NORVASC) 10 MG tablet    Sig: Take 1 tablet (10 mg total) by mouth daily.    Dispense:  90 tablet  Refill:  1   Patient Instructions     We recommend that you schedule a mammogram for breast cancer screening. Typically, you do not need a referral to do this. Please contact your imaging center to schedule your mammogram. [facility] - [phone] or [phone]) [phone]   Please schedule appointment with your eye specialist.  No med changes today.    Health Maintenance After Age 39 After age 62, you are at a higher risk for certain long-term diseases and infections as well as injuries from falls. Falls are a major cause of broken bones and head injuries in people who are older than age 85. Getting regular preventive care can help to keep you healthy and well. Preventive care includes getting regular testing and making lifestyle changes as recommended by your health care provider. Talk with your health care  provider about:  Which screenings and tests you should have. A screening is a test that checks for a disease when you have no symptoms.  A diet and exercise plan that is right for you. What should I know about screenings and tests to prevent falls? Screening and testing are the best ways to find a health problem early. Early diagnosis and treatment give you the best chance of managing medical conditions that are common after age 53. Certain conditions and lifestyle choices may make you more likely to have a fall. Your health care provider may recommend:  Regular vision checks. Poor vision and conditions such as cataracts can make you more likely to have a fall. If you wear glasses, make sure to get your prescription updated if your vision changes.  Medicine review. Work with your health care provider to regularly review all of the medicines you are taking, including over-the-counter medicines. Ask your health care provider about any side effects that may make you more likely to have a fall. Tell your health care provider if any medicines that you take make you feel dizzy or sleepy.  Osteoporosis screening. Osteoporosis is a condition that causes the bones to get weaker. This can make the bones weak and cause them to break more easily.  Blood pressure screening. Blood pressure changes and medicines to control blood pressure can make you feel dizzy.  Strength and balance checks. Your health care provider may recommend certain tests to check your strength and balance while standing, walking, or changing positions.  Foot health exam. Foot pain and numbness, as well as not wearing proper footwear, can make you more likely to have a fall.  Depression screening. You may be more likely to have a fall if you have a fear of falling, feel emotionally low, or feel unable to do activities that you used to do.  Alcohol use screening. Using too much alcohol can affect your balance and may make you more likely  to have a fall. What actions can I take to lower my risk of falls? General instructions  Talk with your health care provider about your risks for falling. Tell your health care provider if: ? You fall. Be sure to tell your health care provider about all falls, even ones that seem minor. ? You feel dizzy, sleepy, or off-balance.  Take over-the-counter and prescription medicines only as told by your health care provider. These include any supplements.  Eat a healthy diet and maintain a healthy weight. A healthy diet includes low-fat dairy products, low-fat (lean) meats, and fiber from whole grains, beans, and lots of fruits and vegetables. Home safety  Remove any tripping hazards, such as rugs, cords, and clutter.  Install safety equipment such as grab bars in bathrooms and safety rails on stairs.  Keep rooms and walkways well-lit. Activity   Follow a regular exercise program to stay fit. This will help you maintain your balance. Ask your health care provider what types of exercise are appropriate for you.  If you need a cane or walker, use it as recommended by your health care provider.  Wear supportive shoes that have nonskid soles. Lifestyle  Do not drink alcohol if your health care provider tells you not to drink.  If you drink alcohol, limit how much you have: ? 0-1 drink a day for women. ? 0-2 drinks a day for men.  Be aware of how much alcohol is in your drink. In the [place] one drink equals one typical bottle of beer (12 oz), one-half glass of wine (5 oz), or one shot of hard liquor (1 oz).  Do not use any products that contain nicotine or tobacco, such as cigarettes and e-cigarettes. If you need help quitting, ask your health care provider. Summary  Having a healthy lifestyle and getting preventive care can help to protect your health and wellness after age 23.  Screening and testing are the best way to find a health problem early and help you avoid having a fall.  Early diagnosis and treatment give you the best chance for managing medical conditions that are more common for people who are older than age 53.  Falls are a major cause of broken bones and head injuries in people who are older than age 24. Take precautions to prevent a fall at home.  Work with your health care provider to learn what changes you can make to improve your health and wellness and to prevent falls. This information is not intended to replace advice given to you by your health care provider. Make sure you discuss any questions you have with your health care provider. Document Revised: 09/24/2018 Document Reviewed: 04/16/2017 Elsevier Patient Education  [employer].   If you have lab work done today you will be contacted with your lab results within the next 2 weeks.  If you have not heard from [place] then please contact us. The fastest way to get your results is to register for My Chart.   IF you received an x-ray today, you will receive an invoice from [facility]. Please contact [facility] at [phone] with questions or concerns regarding your invoice.   IF you received labwork today, you will receive an invoice from [facility]. Please contact [facility] at [phone] with questions or concerns regarding your invoice.   Our billing staff will not be able to assist you with questions regarding bills from these companies.  You will be contacted with the lab results as soon as they are available. The fastest way to get your results is to activate your My Chart account. Instructions are located on the last page of this paperwork. If you have not heard from [place] regarding the results in 2 weeks, please contact this office.         Signed, Merri Ray, MD [facility]

## 2020-05-17 NOTE — Patient Instructions (Addendum)
We recommend that you schedule a mammogram for breast cancer screening. Typically, you do not need a referral to do this. Please contact your imaging center to schedule your mammogram. [facility] ([place]) - [phone] or [phone]) [phone]   Please schedule appointment with your eye specialist.  No med changes today.    Health Maintenance After Age 72 After age 77, you are at a higher risk for certain long-term diseases and infections as well as injuries from falls. Falls are a major cause of broken bones and head injuries in people who are older than age 25. Getting regular preventive care can help to keep you healthy and well. Preventive care includes getting regular testing and making lifestyle changes as recommended by your health care provider. Talk with your health care provider about:  Which screenings and tests you should have. A screening is a test that checks for a disease when you have no symptoms.  A diet and exercise plan that is right for you. What should I know about screenings and tests to prevent falls? Screening and testing are the best ways to find a health problem early. Early diagnosis and treatment give you the best chance of managing medical conditions that are common after age 83. Certain conditions and lifestyle choices may make you more likely to have a fall. Your health care provider may recommend:  Regular vision checks. Poor vision and conditions such as cataracts can make you more likely to have a fall. If you wear glasses, make sure to get your prescription updated if your vision changes.  Medicine review. Work with your health care provider to regularly review all of the medicines you are taking, including over-the-counter medicines. Ask your health care provider about any side effects that may make you more likely to have a fall. Tell your health care provider if any medicines that you take make you feel dizzy or sleepy.  Osteoporosis  screening. Osteoporosis is a condition that causes the bones to get weaker. This can make the bones weak and cause them to break more easily.  Blood pressure screening. Blood pressure changes and medicines to control blood pressure can make you feel dizzy.  Strength and balance checks. Your health care provider may recommend certain tests to check your strength and balance while standing, walking, or changing positions.  Foot health exam. Foot pain and numbness, as well as not wearing proper footwear, can make you more likely to have a fall.  Depression screening. You may be more likely to have a fall if you have a fear of falling, feel emotionally low, or feel unable to do activities that you used to do.  Alcohol use screening. Using too much alcohol can affect your balance and may make you more likely to have a fall. What actions can I take to lower my risk of falls? General instructions  Talk with your health care provider about your risks for falling. Tell your health care provider if: ? You fall. Be sure to tell your health care provider about all falls, even ones that seem minor. ? You feel dizzy, sleepy, or off-balance.  Take over-the-counter and prescription medicines only as told by your health care provider. These include any supplements.  Eat a healthy diet and maintain a healthy weight. A healthy diet includes low-fat dairy products, low-fat (lean) meats, and fiber from whole grains, beans, and lots of fruits and vegetables. Home safety  Remove any tripping hazards, such as rugs, cords, and clutter.  Install safety equipment such as grab bars in bathrooms and safety rails on stairs.  Keep rooms and walkways well-lit. Activity   Follow a regular exercise program to stay fit. This will help you maintain your balance. Ask your health care provider what types of exercise are appropriate for you.  If you need a cane or walker, use it as recommended by your health care  provider.  Wear supportive shoes that have nonskid soles. Lifestyle  Do not drink alcohol if your health care provider tells you not to drink.  If you drink alcohol, limit how much you have: ? 0-1 drink a day for women. ? 0-2 drinks a day for men.  Be aware of how much alcohol is in your drink. In the [place] one drink equals one typical bottle of beer (12 oz), one-half glass of wine (5 oz), or one shot of hard liquor (1 oz).  Do not use any products that contain nicotine or tobacco, such as cigarettes and e-cigarettes. If you need help quitting, ask your health care provider. Summary  Having a healthy lifestyle and getting preventive care can help to protect your health and wellness after age 79.  Screening and testing are the best way to find a health problem early and help you avoid having a fall. Early diagnosis and treatment give you the best chance for managing medical conditions that are more common for people who are older than age 58.  Falls are a major cause of broken bones and head injuries in people who are older than age 64. Take precautions to prevent a fall at home.  Work with your health care provider to learn what changes you can make to improve your health and wellness and to prevent falls. This information is not intended to replace advice given to you by your health care provider. Make sure you discuss any questions you have with your health care provider. Document Revised: 09/24/2018 Document Reviewed: 04/16/2017 Elsevier Patient Education  [employer].   If you have lab work done today you will be contacted with your lab results within the next 2 weeks.  If you have not heard from [place] then please contact us. The fastest way to get your results is to register for My Chart.   IF you received an x-ray today, you will receive an invoice from [facility]. Please contact [facility] at [phone] with questions or concerns regarding your  invoice.   IF you received labwork today, you will receive an invoice from [facility]. Please contact [facility] at [phone] with questions or concerns regarding your invoice.   Our billing staff will not be able to assist you with questions regarding bills from these companies.  You will be contacted with the lab results as soon as they are available. The fastest way to get your results is to activate your My Chart account. Instructions are located on the last page of this paperwork. If you have not heard from [place] regarding the results in 2 weeks, please contact this office.

## 2020-05-18 ENCOUNTER — Encounter (HOSPITAL_COMMUNITY): Payer: Self-pay

## 2020-05-18 ENCOUNTER — Ambulatory Visit (HOSPITAL_COMMUNITY)
Admission: RE | Admit: 2020-05-18 | Discharge: 2020-05-18 | Disposition: A | Discharge status: Home / Self Care | Payer: BC Managed Care – PPO | Source: Ambulatory Visit | Attending: Internal Medicine | Admitting: Internal Medicine

## 2020-05-18 ENCOUNTER — Inpatient Hospital Stay: Payer: BC Managed Care – PPO | Attending: Internal Medicine | Admitting: Internal Medicine

## 2020-05-18 ENCOUNTER — Other Ambulatory Visit: Payer: Self-pay

## 2020-05-18 ENCOUNTER — Encounter: Payer: Self-pay | Admitting: Internal Medicine

## 2020-05-18 VITALS — BP 125/87 | HR 95 | Temp 97.4°F | Resp 18 | Ht 68.0 in | Wt 181.6 lb

## 2020-05-18 DIAGNOSIS — C3491 Malignant neoplasm of unspecified part of right bronchus or lung: Secondary | ICD-10-CM | POA: Diagnosis not present

## 2020-05-18 DIAGNOSIS — Z9221 Personal history of antineoplastic chemotherapy: Secondary | ICD-10-CM | POA: Diagnosis not present

## 2020-05-18 DIAGNOSIS — C3490 Malignant neoplasm of unspecified part of unspecified bronchus or lung: Secondary | ICD-10-CM | POA: Diagnosis not present

## 2020-05-18 DIAGNOSIS — Z923 Personal history of irradiation: Secondary | ICD-10-CM | POA: Diagnosis not present

## 2020-05-18 DIAGNOSIS — C3411 Malignant neoplasm of upper lobe, right bronchus or lung: Secondary | ICD-10-CM | POA: Insufficient documentation

## 2020-05-18 DIAGNOSIS — Z79899 Other long term (current) drug therapy: Secondary | ICD-10-CM | POA: Diagnosis not present

## 2020-05-18 DIAGNOSIS — I2510 Atherosclerotic heart disease of native coronary artery without angina pectoris: Secondary | ICD-10-CM | POA: Diagnosis not present

## 2020-05-18 DIAGNOSIS — J8410 Pulmonary fibrosis, unspecified: Secondary | ICD-10-CM | POA: Diagnosis not present

## 2020-05-18 DIAGNOSIS — I10 Essential (primary) hypertension: Secondary | ICD-10-CM

## 2020-05-18 DIAGNOSIS — Z85118 Personal history of other malignant neoplasm of bronchus and lung: Secondary | ICD-10-CM | POA: Diagnosis not present

## 2020-05-18 DIAGNOSIS — I313 Pericardial effusion (noninflammatory): Secondary | ICD-10-CM | POA: Diagnosis not present

## 2020-05-18 MED ORDER — IOHEXOL 300 MG/ML  SOLN
75.0000 mL | Freq: Once | INTRAMUSCULAR | Status: AC | PRN
  Administered 2020-05-18: 75 mL via INTRAVENOUS

## 2020-05-18 NOTE — Progress Notes (Signed)
[facility] Telephone:([phone]) [phone]   Fax:([phone]) [phone]  OFFICE PROGRESS NOTE  Wendie Agreste, MD [place] [address]  DIAGNOSIS: Stage IIIB (T4, N2, M0) non-small cell lung cancer, poorly differentiated squamous cell carcinoma.  She presented with large right upper lobe lung mass in addition to right hilar and mediastinal lymphadenopathy diagnosed in January 2020.  PRIOR THERAPY:  1) Concurrent chemoradiation with weekly carboplatin for AUC of 2 and paclitaxel 45 mg/M2.  First dose July 13, 2018.  Status post 8 cycles.  Last dose was given August 31, 2018. 2) Consolidation treatment with immunotherapy with Imfinzi 10 mg/KG every 2 weeks.  First dose starts October 13, 2018.  Status post 26 cycles.  CURRENT THERAPY: Observation.  INTERVAL HISTORY: Christina [name] 74 y.o. female returns to the clinic today for follow-up visit.  The patient is feeling fine today with no concerning complaints.  She denied having any chest pain, shortness of breath, cough or hemoptysis.  She denied having any fever or chills.  She has no nausea, vomiting, diarrhea or constipation.  She denied having any recent weight loss or night sweats.  She is currently on observation and doing well.  She had repeat CT scan of the chest performed recently and she is here for evaluation and discussion of her risk her results.  MEDICAL HISTORY: Past Medical History:  Diagnosis Date  . Allergy   . GERD (gastroesophageal reflux disease)   . Hyperlipidemia   . Hypertension   . nscl ca dx'd 05/2018   lung cancer  . Reflux   . Tubulovillous adenoma of colon 2003    ALLERGIES:  is allergic to coconut oil, codeine, shrimp [shellfish allergy], and trandolapril.  MEDICATIONS:  Current Outpatient Medications  Medication Sig Dispense Refill  . albuterol (VENTOLIN HFA) 108 (90 Base) MCG/ACT inhaler INHALE 1-2 PUFFS INTO THE LUNGS EVERY 6 HOURS AS NEEDED FOR WHEEZING OR SHORTNESS OF  BREATH 6.7 g 1  . amLODipine (NORVASC) 10 MG tablet Take 1 tablet (10 mg total) by mouth daily. 90 tablet 1  . aspirin EC 81 MG tablet Take 81 mg by mouth daily.    Marland Kitchen atorvastatin (LIPITOR) 40 MG tablet TAKE 1 TABLET BY MOUTH DAILY 90 tablet 2  . HYDROcodone-acetaminophen (HYCET) 7.5-325 mg/15 ml solution Take 15 mLs by mouth 4 (four) times daily as needed. 473 mL 0  . omeprazole (PRILOSEC) 40 MG capsule TAKE 1 CAPSULE(40 MG) BY MOUTH DAILY 90 capsule 2  . potassium chloride (KLOR-CON) 10 MEQ tablet TAKE 1 TABLET(10 MEQ) BY MOUTH TWICE DAILY 60 tablet 6  . sucralfate (CARAFATE) 1 g tablet Take 1 tablet (1 g total) by mouth 4 (four) times daily as needed (stomach acid). 120 tablet 6   No current facility-administered medications for this visit.    SURGICAL HISTORY:  Past Surgical History:  Procedure Laterality Date  . CESAREAN SECTION     1 time  . COLONOSCOPY    . POLYPECTOMY    . VIDEO BRONCHOSCOPY WITH ENDOBRONCHIAL NAVIGATION N/A 06/24/2018   Procedure: VIDEO BRONCHOSCOPY WITH ENDOBRONCHIAL NAVIGATION with biopsies, right upper lung lobe;  Surgeon: Melrose Nakayama, MD;  Location[facility] OR;  Service: Thoracic;  Laterality: N/A;  . VIDEO BRONCHOSCOPY WITH ENDOBRONCHIAL ULTRASOUND N/A 06/24/2018   Procedure: VIDEO BRONCHOSCOPY WITH ENDOBRONCHIAL ULTRASOUND, right upper lung lobe;  Surgeon: Melrose Nakayama, MD;  Location: [facility] OR;  Service: Thoracic;  Laterality: N/A;    REVIEW OF SYSTEMS:  A comprehensive review of systems was negative.   PHYSICAL EXAMINATION: General appearance: alert, cooperative and no distress Head: Normocephalic, without obvious abnormality, atraumatic Neck: no adenopathy, no JVD, supple, symmetrical, trachea midline and thyroid not enlarged, symmetric, no tenderness/mass/nodules Lymph nodes: Cervical, supraclavicular, and axillary nodes normal. Resp: clear to auscultation bilaterally Back: symmetric, no curvature. ROM normal. No CVA tenderness. Cardio:  regular rate and rhythm, S1, S2 normal, no murmur, click, rub or gallop GI: soft, non-tender; bowel sounds normal; no masses,  no organomegaly Extremities: extremities normal, atraumatic, no cyanosis or edema  ECOG PERFORMANCE STATUS: 0 - Asymptomatic  Blood pressure 125/87, pulse 95, temperature (!) 97.4 F (36.3 C), temperature source Tympanic, resp. rate 18, height 5\' 8"  (1.727 m), weight 181 lb 9.6 oz (82.4 kg), SpO2 99 %.  LABORATORY DATA: Lab Results  Component Value Date   WBC 5.1 05/16/2020   HGB 13.0 05/16/2020   HCT 39.5 05/16/2020   MCV 87.2 05/16/2020   PLT 310 05/16/2020      Chemistry      Component Value Date/Time   NA 141 05/16/2020 0956   NA 144 04/02/2017 1435   K 3.5 05/16/2020 0956   CL 107 05/16/2020 0956   CO2 25 05/16/2020 0956   BUN 18 05/16/2020 0956   BUN 11 04/02/2017 1435   CREATININE 1.00 05/16/2020 0956   CREATININE 0.87 03/22/2016 0908      Component Value Date/Time   CALCIUM 9.6 05/16/2020 0956   ALKPHOS 90 05/16/2020 0956   AST 17 05/16/2020 0956   ALT 12 05/16/2020 0956   BILITOT 0.4 05/16/2020 0956       RADIOGRAPHIC STUDIES: CT Chest W Contrast  Result Date: 05/18/2020 CLINICAL DATA:  Primary Cancer Type: Lung Imaging Indication: Routine surveillance Interval therapy since last imaging? No Initial Cancer Diagnosis Date: 06/24/2018; Established by: Biopsy-proven Detailed Pathology: Stage IIIB non-small cell lung cancer, poorly differentiated squamous cell carcinoma. Primary Tumor location: Right upper lobe. Surgeries: No. Chemotherapy: Yes; Ongoing? No; Most recent administration: 08/31/2018 Immunotherapy?  Yes; Type: Imfinzi; Ongoing? No Radiation therapy? Yes; Date Range: 07/21/2018 - 09/03/2018; Target: Right lung EXAM: CT CHEST WITH CONTRAST TECHNIQUE: Multidetector CT imaging of the chest was performed during intravenous contrast administration. CONTRAST:  62mL OMNIPAQUE IOHEXOL 300 MG/ML  SOLN COMPARISON:  Most recent CT chest  01/14/2020.  07/02/2018 PET-CT. FINDINGS: Cardiovascular: Normal heart size. Stable small pericardial effusion. Left anterior descending and right coronary atherosclerosis. Atherosclerotic nonaneurysmal thoracic aorta. Normal caliber pulmonary arteries. No central pulmonary emboli. Mediastinum/Nodes: Heterogeneous 2.8 cm left thyroid nodule is unchanged using similar measurement technique. Unremarkable esophagus. No pathologically enlarged axillary, mediastinal or hilar lymph nodes. Lungs/Pleura: No pneumothorax. No pleural effusion. Moderate centrilobular emphysema with diffuse bronchial wall thickening. Stable irregular bandlike focus of consolidation in the right upper lobe measuring up to the 1.5 cm thickness (series 7/image 50), previously 1.6 cm, not appreciably changed, with associated parenchymal distortion and volume loss, compatible with radiation fibrosis. A few tiny solid pulmonary nodules in the superior segment right lower lobe, largest 2 mm (series 7/image 69), all stable. No acute consolidative airspace disease or new significant pulmonary nodules. Upper abdomen: Hypodense subcentimeter posterior right liver lesion (series 2/image 145), too small to characterize, stable. Musculoskeletal: No aggressive appearing focal osseous lesions. Mild thoracic spondylosis. IMPRESSION: 1. 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 2. No findings suspicious for metastatic disease in the chest. Tiny scattered superior segment right lower lobe pulmonary nodules are  stable. 3. Stable small pericardial effusion. 4. Two-vessel coronary atherosclerosis. 5. Aortic Atherosclerosis (ICD10-I70.0) and Emphysema (ICD10-J43.9). Electronically Signed   By: Ilona Sorrel M.D.   On: 05/18/2020 10:14    ASSESSMENT AND PLAN: This is a very pleasant 74 years old African-American female recently diagnosed with a stage IIIB (T4, N2, M0) non-small cell lung cancer, poorly  differentiated squamous cell carcinoma presented with right upper lobe lung mass in addition to right hilar and mediastinal lymphadenopathy. The patient underwent concurrent chemoradiation with weekly carboplatin and paclitaxel status post 8 cycles. She has partial response to this treatment. She also completed a course of consolidation treatment with immunotherapy with Imfinzi every 2 weeks status post 26 cycles. The patient is currently on observation and she is feeling fine today with no concerning complaints. She had repeat CT scan of the chest performed recently.  I personally and independently reviewed the scans and discussed the results with the patient today. Her scan showed no concerning findings for disease recurrence or metastasis. I recommended for her to continue on observation with repeat CT scan of the chest in 4 months. The patient was advised to call immediately if she has any concerning symptoms in the interval. The patient voices understanding of current disease status and treatment options and is in agreement with the current care plan.  All questions were answered. The patient knows to call the clinic with any problems, questions or concerns. We can certainly see the patient much sooner if necessary.  Disclaimer: This note was dictated with voice recognition software. Similar sounding words can inadvertently be transcribed and may not be corrected upon review.

## 2020-05-24 ENCOUNTER — Telehealth: Payer: Self-pay | Admitting: Internal Medicine

## 2020-05-24 NOTE — Telephone Encounter (Signed)
Scheduled per los. Called and left msg. Mailed printout  °

## 2020-07-16 ENCOUNTER — Other Ambulatory Visit: Payer: Self-pay | Admitting: Family Medicine

## 2020-07-16 DIAGNOSIS — R062 Wheezing: Secondary | ICD-10-CM

## 2020-07-16 NOTE — Telephone Encounter (Signed)
Requested Prescriptions  Pending Prescriptions Disp Refills  . albuterol (VENTOLIN HFA) 108 (90 Base) MCG/ACT inhaler [Pharmacy Med Name: ALBUTEROL HFA INH (200 PUFFS)6.7GM] 6.7 g 1    Sig: INHALE 2 PUFFS INTO THE LUNGS EVERY 6 HOURS AS NEEDED FOR WHEEZING OR SHORTNESS OF BREATH     Pulmonology:  Beta Agonists Failed - 07/16/2020 11:23 AM      Failed - One inhaler should last at least one month. If the patient is requesting refills earlier, contact the patient to check for uncontrolled symptoms.      Passed - Valid encounter within last 12 months    Recent Outpatient Visits          2 months ago Annual physical exam   [facility], Ranell Patrick, MD   6 months ago Acute pain of left shoulder   [facility], Delfino Lovett, NP   7 months ago Acute pain of left shoulder   [facility], NP   8 months ago Essential hypertension   [facility], Ranell Patrick, MD   1 year ago Essential hypertension   [facility], Arlie Solomons, MD      Future Appointments            In 4 months Carlota Raspberry Ranell Patrick, MD [facility] [place], [facility]

## 2020-07-25 ENCOUNTER — Other Ambulatory Visit: Payer: Self-pay

## 2020-07-25 ENCOUNTER — Encounter: Payer: Self-pay | Admitting: Registered Nurse

## 2020-07-25 ENCOUNTER — Telehealth (INDEPENDENT_AMBULATORY_CARE_PROVIDER_SITE_OTHER): Payer: BC Managed Care – PPO | Admitting: Registered Nurse

## 2020-07-25 DIAGNOSIS — J0110 Acute frontal sinusitis, unspecified: Secondary | ICD-10-CM

## 2020-07-25 MED ORDER — AMOXICILLIN-POT CLAVULANATE 875-125 MG PO TABS: 1.0000 | ORAL_TABLET | Freq: Two times a day (BID) | ORAL | 0 refills | Status: DC

## 2020-07-25 MED ORDER — AZELASTINE HCL 0.1 % NA SOLN: 1.0000 | Freq: Two times a day (BID) | NASAL | 12 refills | Status: DC

## 2020-07-25 NOTE — Patient Instructions (Signed)
° ° ° °  If you have lab work done today you will be contacted with your lab results within the next 2 weeks.  If you have not heard from us then please contact us. The fastest way to get your results is to register for My Chart. ° ° °IF you received an x-ray today, you will receive an invoice from [facility]. Please contact [facility] at [phone] with questions or concerns regarding your invoice.  ° °IF you received labwork today, you will receive an invoice from [facility]. Please contact [facility] at [phone] with questions or concerns regarding your invoice.  ° °Our billing staff will not be able to assist you with questions regarding bills from these companies. ° °You will be contacted with the lab results as soon as they are available. The fastest way to get your results is to activate your My Chart account. Instructions are located on the last page of this paperwork. If you have not heard from us regarding the results in 2 weeks, please contact this office. °  ° ° ° °

## 2020-09-15 ENCOUNTER — Ambulatory Visit (HOSPITAL_COMMUNITY)
Admission: RE | Admit: 2020-09-15 | Discharge: 2020-09-15 | Disposition: A | Discharge status: Home / Self Care | Payer: BC Managed Care – PPO | Source: Ambulatory Visit | Attending: Internal Medicine | Admitting: Internal Medicine

## 2020-09-15 ENCOUNTER — Ambulatory Visit (HOSPITAL_COMMUNITY): Payer: BC Managed Care – PPO

## 2020-09-15 ENCOUNTER — Inpatient Hospital Stay: Payer: BC Managed Care – PPO | Attending: Internal Medicine

## 2020-09-15 ENCOUNTER — Other Ambulatory Visit: Payer: Self-pay

## 2020-09-15 DIAGNOSIS — C3411 Malignant neoplasm of upper lobe, right bronchus or lung: Secondary | ICD-10-CM | POA: Diagnosis not present

## 2020-09-15 DIAGNOSIS — C3490 Malignant neoplasm of unspecified part of unspecified bronchus or lung: Secondary | ICD-10-CM

## 2020-09-15 DIAGNOSIS — Z9221 Personal history of antineoplastic chemotherapy: Secondary | ICD-10-CM | POA: Diagnosis not present

## 2020-09-15 DIAGNOSIS — Z5111 Encounter for antineoplastic chemotherapy: Secondary | ICD-10-CM | POA: Diagnosis not present

## 2020-09-15 DIAGNOSIS — I10 Essential (primary) hypertension: Secondary | ICD-10-CM | POA: Diagnosis not present

## 2020-09-15 DIAGNOSIS — Z79899 Other long term (current) drug therapy: Secondary | ICD-10-CM | POA: Insufficient documentation

## 2020-09-15 DIAGNOSIS — Z923 Personal history of irradiation: Secondary | ICD-10-CM | POA: Insufficient documentation

## 2020-09-15 DIAGNOSIS — J8410 Pulmonary fibrosis, unspecified: Secondary | ICD-10-CM | POA: Diagnosis not present

## 2020-09-15 DIAGNOSIS — I2510 Atherosclerotic heart disease of native coronary artery without angina pectoris: Secondary | ICD-10-CM | POA: Diagnosis not present

## 2020-09-15 DIAGNOSIS — I313 Pericardial effusion (noninflammatory): Secondary | ICD-10-CM | POA: Diagnosis not present

## 2020-09-15 LAB — CBC WITH DIFFERENTIAL (CANCER CENTER ONLY)
Abs Immature Granulocytes: 0.02 10*3/uL (ref 0.00–0.07)
Basophils Absolute: 0.1 10*3/uL (ref 0.0–0.1)
Basophils Relative: 1 %
Eosinophils Absolute: 0.1 10*3/uL (ref 0.0–0.5)
Eosinophils Relative: 2 %
HCT: 40.9 % (ref 36.0–46.0)
Hemoglobin: 13.5 g/dL (ref 12.0–15.0)
Immature Granulocytes: 0 %
Lymphocytes Relative: 25 %
Lymphs Abs: 1.7 10*3/uL (ref 0.7–4.0)
MCH: 29.1 pg (ref 26.0–34.0)
MCHC: 33 g/dL (ref 30.0–36.0)
MCV: 88.1 fL (ref 80.0–100.0)
Monocytes Absolute: 0.6 10*3/uL (ref 0.1–1.0)
Monocytes Relative: 9 %
Neutro Abs: 4.1 10*3/uL (ref 1.7–7.7)
Neutrophils Relative %: 63 %
Platelet Count: 329 10*3/uL (ref 150–400)
RBC: 4.64 MIL/uL (ref 3.87–5.11)
RDW: 14.8 % (ref 11.5–15.5)
WBC Count: 6.6 10*3/uL (ref 4.0–10.5)
nRBC: 0 % (ref 0.0–0.2)

## 2020-09-15 LAB — CMP (CANCER CENTER ONLY)
ALT: 15 U/L (ref 0–44)
AST: 16 U/L (ref 15–41)
Albumin: 4 g/dL (ref 3.5–5.0)
Alkaline Phosphatase: 94 U/L (ref 38–126)
Anion gap: 12 (ref 5–15)
BUN: 15 mg/dL (ref 8–23)
CO2: 26 mmol/L (ref 22–32)
Calcium: 9.5 mg/dL (ref 8.9–10.3)
Chloride: 104 mmol/L (ref 98–111)
Creatinine: 0.97 mg/dL (ref 0.44–1.00)
GFR, Estimated: >60 mL/min (ref >60)
Glucose, Bld: 91 mg/dL (ref 70–99)
Potassium: 4 mmol/L (ref 3.5–5.1)
Sodium: 142 mmol/L (ref 135–145)
Total Bilirubin: 0.5 mg/dL (ref 0.3–1.2)
Total Protein: 7.4 g/dL (ref 6.5–8.1)

## 2020-09-18 ENCOUNTER — Other Ambulatory Visit: Payer: Self-pay

## 2020-09-18 ENCOUNTER — Inpatient Hospital Stay (HOSPITAL_BASED_OUTPATIENT_CLINIC_OR_DEPARTMENT_OTHER): Payer: BC Managed Care – PPO | Admitting: Internal Medicine

## 2020-09-18 VITALS — BP 146/89 | HR 100 | Temp 97.5°F | Resp 19 | Ht 68.0 in | Wt 183.6 lb

## 2020-09-18 DIAGNOSIS — C3411 Malignant neoplasm of upper lobe, right bronchus or lung: Secondary | ICD-10-CM | POA: Diagnosis not present

## 2020-09-18 DIAGNOSIS — Z9221 Personal history of antineoplastic chemotherapy: Secondary | ICD-10-CM | POA: Diagnosis not present

## 2020-09-18 DIAGNOSIS — C3490 Malignant neoplasm of unspecified part of unspecified bronchus or lung: Secondary | ICD-10-CM

## 2020-09-18 DIAGNOSIS — I10 Essential (primary) hypertension: Secondary | ICD-10-CM | POA: Diagnosis not present

## 2020-09-18 DIAGNOSIS — Z923 Personal history of irradiation: Secondary | ICD-10-CM | POA: Diagnosis not present

## 2020-09-18 DIAGNOSIS — C3491 Malignant neoplasm of unspecified part of right bronchus or lung: Secondary | ICD-10-CM | POA: Diagnosis not present

## 2020-09-18 DIAGNOSIS — Z79899 Other long term (current) drug therapy: Secondary | ICD-10-CM | POA: Diagnosis not present

## 2020-09-18 NOTE — Progress Notes (Signed)
[place] [phone]   Fax:([phone]) [phone]  OFFICE PROGRESS NOTE  Wendie Agreste, MD [address] [place] [address]  DIAGNOSIS: Stage IIIB (T4, N2, M0) non-small cell lung cancer, poorly differentiated squamous cell carcinoma.  She presented with large right upper lobe lung mass in addition to right hilar and mediastinal lymphadenopathy diagnosed in January 2020.  PRIOR THERAPY:  1) Concurrent chemoradiation with weekly carboplatin for AUC of 2 and paclitaxel 45 mg/M2.  First dose July 13, 2018.  Status post 8 cycles.  Last dose was given August 31, 2018. 2) Consolidation treatment with immunotherapy with Imfinzi 10 mg/KG every 2 weeks.  First dose starts October 13, 2018.  Status post 26 cycles.  CURRENT THERAPY: Observation.  INTERVAL HISTORY: Christina [name] 75 y.o. female returns to the clinic today for follow-up visit.  The patient is feeling fine today with no concerning complaints.  She denied having any current chest pain, shortness of breath, cough or hemoptysis.  She denied having any fever or chills.  She has no nausea, vomiting, diarrhea or constipation.  She has no headache or visual changes.  She had repeat CT scan of the chest performed recently and she is here for evaluation and discussion of her scan results.  MEDICAL HISTORY: Past Medical History:  Diagnosis Date  . Allergy   . GERD (gastroesophageal reflux disease)   . Hyperlipidemia   . Hypertension   . nscl ca dx'd 05/2018   lung cancer  . Reflux   . Tubulovillous adenoma of colon 2003    ALLERGIES:  is allergic to coconut oil, codeine, shrimp [shellfish allergy], and trandolapril.  MEDICATIONS:  Current Outpatient Medications  Medication Sig Dispense Refill  . albuterol (VENTOLIN HFA) 108 (90 Base) MCG/ACT inhaler INHALE 2 PUFFS INTO THE LUNGS EVERY 6 HOURS AS NEEDED FOR WHEEZING OR SHORTNESS OF BREATH 6.7 g 1  . amLODipine (NORVASC) 10 MG tablet Take 1  tablet (10 mg total) by mouth daily. 90 tablet 1  . amoxicillin-clavulanate (AUGMENTIN) 875-125 MG tablet Take 1 tablet by mouth 2 (two) times daily. 14 tablet 0  . aspirin EC 81 MG tablet Take 81 mg by mouth daily.    Marland Kitchen atorvastatin (LIPITOR) 40 MG tablet TAKE 1 TABLET BY MOUTH DAILY 90 tablet 2  . azelastine (ASTELIN) 0.1 % nasal spray Place 1 spray into both nostrils 2 (two) times daily. Use in each nostril as directed 30 mL 12  . omeprazole (PRILOSEC) 40 MG capsule TAKE 1 CAPSULE(40 MG) BY MOUTH DAILY 90 capsule 2  . potassium chloride (KLOR-CON) 10 MEQ tablet TAKE 1 TABLET(10 MEQ) BY MOUTH TWICE DAILY 60 tablet 6  . sucralfate (CARAFATE) 1 g tablet Take 1 tablet (1 g total) by mouth 4 (four) times daily as needed (stomach acid). 120 tablet 6   No current facility-administered medications for this visit.    SURGICAL HISTORY:  Past Surgical History:  Procedure Laterality Date  . CESAREAN SECTION     1 time  . COLONOSCOPY    . POLYPECTOMY    . VIDEO BRONCHOSCOPY WITH ENDOBRONCHIAL NAVIGATION N/A 06/24/2018   Procedure: VIDEO BRONCHOSCOPY WITH ENDOBRONCHIAL NAVIGATION with biopsies, right upper lung lobe;  Surgeon: Melrose Nakayama, MD;  Location: [facility] OR;  Service: Thoracic;  Laterality: N/A;  . VIDEO BRONCHOSCOPY WITH ENDOBRONCHIAL ULTRASOUND N/A 06/24/2018   Procedure: VIDEO BRONCHOSCOPY WITH ENDOBRONCHIAL ULTRASOUND, right upper lung lobe;  Surgeon: Melrose Nakayama, MD;  Location:  [facility] OR;  Service: Thoracic;  Laterality: N/A;    REVIEW OF SYSTEMS:  A comprehensive review of systems was negative.   PHYSICAL EXAMINATION: General appearance: alert, cooperative and no distress Head: Normocephalic, without obvious abnormality, atraumatic Neck: no adenopathy, no JVD, supple, symmetrical, trachea midline and thyroid not enlarged, symmetric, no tenderness/mass/nodules Lymph nodes: Cervical, supraclavicular, and axillary nodes normal. Resp: clear to auscultation bilaterally Back:  symmetric, no curvature. ROM normal. No CVA tenderness. Cardio: regular rate and rhythm, S1, S2 normal, no murmur, click, rub or gallop GI: soft, non-tender; bowel sounds normal; no masses,  no organomegaly Extremities: extremities normal, atraumatic, no cyanosis or edema  ECOG PERFORMANCE STATUS: 0 - Asymptomatic  Blood pressure (!) 146/89, pulse 100, temperature (!) 97.5 F (36.4 C), temperature source Tympanic, resp. rate 19, height 5\' 8"  (1.727 m), weight 183 lb 9.6 oz (83.3 kg), SpO2 98 %.  LABORATORY DATA: Lab Results  Component Value Date   WBC 6.6 09/15/2020   HGB 13.5 09/15/2020   HCT 40.9 09/15/2020   MCV 88.1 09/15/2020   PLT 329 09/15/2020      Chemistry      Component Value Date/Time   NA 142 09/15/2020 0744   NA 144 04/02/2017 1435   K 4.0 09/15/2020 0744   CL 104 09/15/2020 0744   CO2 26 09/15/2020 0744   BUN 15 09/15/2020 0744   BUN 11 04/02/2017 1435   CREATININE 0.97 09/15/2020 0744   CREATININE 0.87 03/22/2016 0908      Component Value Date/Time   CALCIUM 9.5 09/15/2020 0744   ALKPHOS 94 09/15/2020 0744   AST 16 09/15/2020 0744   ALT 15 09/15/2020 0744   BILITOT 0.5 09/15/2020 0744       RADIOGRAPHIC STUDIES: CT Chest Wo Contrast  Result Date: 09/15/2020 CLINICAL DATA:  Primary Cancer Type: Lung Imaging Indication: Routine surveillance Interval therapy since last imaging? No Initial Cancer Diagnosis Date: 06/24/2018; Established by: Biopsy-proven Detailed Pathology: Stage IIIB non-small cell lung cancer, poorly differentiated squamous cell carcinoma. Primary Tumor location:  Right upper lobe. Surgeries: No. Chemotherapy: Yes; Ongoing? No; Most recent administration: 08/31/2018 Immunotherapy?  Yes; Type: Imfinzi; Ongoing? No Radiation therapy? Yes; Date Range: 07/21/2018 - 09/03/2018; Target: Right lung EXAM: CT CHEST WITHOUT CONTRAST TECHNIQUE: Multidetector CT imaging of the chest was performed following the standard protocol without IV contrast.  COMPARISON:  Most recent CT chest 05/18/2020.  07/02/2018 PET-CT. FINDINGS: Cardiovascular: Aortic atherosclerosis. Normal heart size with similar small pericardial effusion. Multivessel coronary artery atherosclerosis. Mediastinum/Nodes: Hypoattenuating left thyroid nodule on the order of 2.3 cm. This is present back to the chest CT of 05/27/2018, suggesting a benign etiology. (Ref: J Am Coll Radiol. 2015 Feb;12(2): 143-50). No mediastinal or definite hilar adenopathy, given limitations of unenhanced CT. Lungs/Pleura: No pleural fluid.  Moderate centrilobular emphysema. Superior segment right lower lobe pulmonary nodules including at up to 3 mm on 67/5 are unchanged. Similar appearance of right upper lobe interstitial thickening, architectural distortion, and mild consolidation consistent with radiation fibrosis. No well-defined residual or recurrent disease. The area of consolidation measures on the order of 1.3 cm on 56/5 today versus 1.5 cm when measured at the same level on the prior. Upper Abdomen: Subcentimeter high right hepatic lobe cyst. Normal imaged portions of the spleen, pancreas, gallbladder, adrenal glands, kidneys. Proximal gastric underdistention. Suspect concurrent gastric wall thickening, similar. Musculoskeletal: S shaped thoracic spine curvature. IMPRESSION: 1. Similar appearance of right upper lobe radiation fibrosis, without locally recurrent or metastatic disease. 2. Similar small  pericardial effusion. 3. Aortic Atherosclerosis (ICD10-I70.0) and Emphysema (ICD10-J43.9). Coronary artery atherosclerosis. 4. Gastric underdistention with suspicion of concurrent gastric wall thickening. Correlate with symptoms of gastritis. Of note, this is present back to 2019. Electronically Signed   By: Abigail Miyamoto M.D.   On: 09/15/2020 10:35    ASSESSMENT AND PLAN: This is a very pleasant 75 years old African-American female recently diagnosed with a stage IIIB (T4, N2, M0) non-small cell lung cancer,  poorly differentiated squamous cell carcinoma presented with right upper lobe lung mass in addition to right hilar and mediastinal lymphadenopathy. The patient underwent concurrent chemoradiation with weekly carboplatin and paclitaxel status post 8 cycles. She has partial response to this treatment. She also completed a course of consolidation treatment with immunotherapy with Imfinzi every 2 weeks status post 26 cycles. The patient is currently on observation after completion of her treatment and she is feeling well. She had repeat CT scan of the chest performed recently.  I personally and independently reviewed the scans and discussed the results with the patient today. Her scan showed no concerning findings for disease recurrence or metastasis. I recommended for her to continue on observation with repeat CT scan of the chest in 6 months. The patient was advised to call immediately if she has any concerning symptoms in the interval. The patient voices understanding of current disease status and treatment options and is in agreement with the current care plan.  All questions were answered. The patient knows to call the clinic with any problems, questions or concerns. We can certainly see the patient much sooner if necessary.  Disclaimer: This note was dictated with voice recognition software. Similar sounding words can inadvertently be transcribed and may not be corrected upon review.

## 2020-09-20 ENCOUNTER — Telehealth: Payer: Self-pay | Admitting: Internal Medicine

## 2020-09-20 NOTE — Telephone Encounter (Signed)
Scheduled per los. Called and left msg. Mailed printout  °

## 2020-09-28 ENCOUNTER — Ambulatory Visit
Admission: EM | Admit: 2020-09-28 | Discharge: 2020-09-28 | Disposition: A | Discharge status: Home / Self Care | Payer: BC Managed Care – PPO | Attending: Family Medicine | Admitting: Family Medicine

## 2020-09-28 ENCOUNTER — Other Ambulatory Visit: Payer: Self-pay

## 2020-09-28 DIAGNOSIS — R062 Wheezing: Secondary | ICD-10-CM

## 2020-09-28 DIAGNOSIS — J209 Acute bronchitis, unspecified: Secondary | ICD-10-CM

## 2020-09-28 MED ORDER — PROMETHAZINE-DM 6.25-15 MG/5ML PO SYRP: 5.0000 mL | ORAL_SOLUTION | Freq: Four times a day (QID) | ORAL | 0 refills | Status: DC | PRN

## 2020-09-28 MED ORDER — DOXYCYCLINE HYCLATE 100 MG PO CAPS: 100.0000 mg | ORAL_CAPSULE | Freq: Two times a day (BID) | ORAL | 0 refills | Status: DC

## 2020-09-28 MED ORDER — PREDNISONE 20 MG PO TABS
40.0000 mg | ORAL_TABLET | Freq: Every day | ORAL | 0 refills | Status: DC
Start: 2020-09-28 — End: 2021-03-07

## 2020-09-28 NOTE — ED Provider Notes (Signed)
[facility]    CSN: [id number] Arrival date & time: 09/28/20  0815      History   Chief Complaint Chief Complaint  Patient presents with  . Cough    HPI Christina [name] is a 75 y.o. female.   HPI  Patient presents for evaluation of cough and congestion x2 weeks.  She reports her symptoms are worse at night.  She has had some wheezing without any shortness of breath and has not used her albuterol inhaler more frequently over the last week.  Patient has non-small cell squamous lung cancer most recent CT was negative for any pneumonias which she had performed on 09/15/2020.  She has centrilobular emphysema.  Past Medical History:  Diagnosis Date  . Allergy   . GERD (gastroesophageal reflux disease)   . Hyperlipidemia   . Hypertension   . nscl ca dx'd 05/2018   lung cancer  . Reflux   . Tubulovillous adenoma of colon 2003    Patient Active Problem List   Diagnosis Date Noted  . Encounter for antineoplastic immunotherapy 10/06/2018  . Stage III squamous cell carcinoma of right lung ([place]) 07/06/2018  . Goals of care, counseling/discussion 07/06/2018  . Encounter for antineoplastic chemotherapy 07/06/2018  . Primary cancer of right upper lobe of lung ([place]) 06/18/2018  . Acute URI 05/25/2018  . Seasonal allergies 10/06/2017  . Thyromegaly 07/01/2017  . Colon polyps 04/02/2017  . Former smoker 09/27/2016  . Cough 04/25/2016  . 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 GERD (gastroesophageal reflux disease) 08/10/2013  . Hyperlipidemia 06/23/2013  . Essential hypertension 07/23/2012    Past Surgical History:  Procedure Laterality Date  . CESAREAN SECTION     1 time  . COLONOSCOPY    . POLYPECTOMY    . VIDEO BRONCHOSCOPY WITH ENDOBRONCHIAL NAVIGATION N/A 06/24/2018   Procedure: VIDEO BRONCHOSCOPY WITH ENDOBRONCHIAL NAVIGATION with biopsies, right upper lung lobe;  Surgeon: Melrose Nakayama, MD;  Location: [facility] OR;  Service: Thoracic;  Laterality:  N/A;  . VIDEO BRONCHOSCOPY WITH ENDOBRONCHIAL ULTRASOUND N/A 06/24/2018   Procedure: VIDEO BRONCHOSCOPY WITH ENDOBRONCHIAL ULTRASOUND, right upper lung lobe;  Surgeon: Melrose Nakayama, MD;  Location: [facility] OR;  Service: Thoracic;  Laterality: N/A;    OB History   No obstetric history on file.      Home Medications    Prior to Admission medications   Medication Sig Start Date End Date Taking? Authorizing Provider  albuterol (VENTOLIN HFA) 108 (90 Base) MCG/ACT inhaler INHALE 2 PUFFS INTO THE LUNGS EVERY 6 HOURS AS NEEDED FOR WHEEZING OR SHORTNESS OF BREATH 07/16/20   Wendie Agreste, MD  amLODipine (NORVASC) 10 MG tablet Take 1 tablet (10 mg total) by mouth daily. 05/17/20   Wendie Agreste, MD  amoxicillin-clavulanate (AUGMENTIN) 875-125 MG tablet Take 1 tablet by mouth 2 (two) times daily. 07/25/20   Maximiano Coss, NP  aspirin EC 81 MG tablet Take 81 mg by mouth daily.    [provider]  atorvastatin (LIPITOR) 40 MG tablet TAKE 1 TABLET BY MOUTH DAILY 05/17/20   Wendie Agreste, MD  azelastine (ASTELIN) 0.1 % nasal spray Place 1 spray into both nostrils 2 (two) times daily. Use in each nostril as directed 07/25/20   Maximiano Coss, NP  omeprazole (PRILOSEC) 40 MG capsule TAKE 1 CAPSULE(40 MG) BY MOUTH DAILY 05/17/20   Wendie Agreste, MD  potassium chloride (KLOR-CON) 10 MEQ tablet TAKE 1 TABLET(10 MEQ) BY MOUTH TWICE DAILY 05/17/20   Merri Ray  R, MD  sucralfate (CARAFATE) 1 g tablet Take 1 tablet (1 g total) by mouth 4 (four) times daily as needed (stomach acid). 05/17/20   Wendie Agreste, MD    Family History Family History  Problem Relation Age of Onset  . Hypertension Mother   . Heart disease Mother   . Leukemia Father   . Cancer Father   . Hypertension Sister   . Hypertension Sister   . Colon cancer Neg Hx   . Colon polyps Neg Hx   . Stomach cancer Neg Hx   . Esophageal cancer Neg Hx     Social History Social History   Tobacco Use  . Smoking  status: Former Smoker    Packs/day: 0.50    Years: 50.00    Pack years: 25.00    Types: Cigarettes  . Smokeless tobacco: Never Used  . Tobacco comment: Previously quit in 2015 but has started smoking again   Vaping Use  . Vaping Use: Never used  Substance Use Topics  . Alcohol use: No    Alcohol/week: 0.0 standard drinks  . Drug use: No     Allergies   Coconut oil, Codeine, Shrimp [shellfish allergy], and Trandolapril   Review of Systems Review of Systems Pertinent negatives listed in HPI   Physical Exam Triage Vital Signs ED Triage Vitals [09/28/20 0856]  Enc Vitals Group     BP 134/83     Pulse Rate 98     Resp 18     Temp 98.4 F (36.9 C)     Temp Source Oral     SpO2 94 %     Weight      Height      Head Circumference      Peak Flow      Pain Score 0     Pain Loc      Pain Edu?      Excl. in [place]?    No data found.  Updated Vital Signs BP 134/83 (BP Location: Left Arm)   Pulse 98   Temp 98.4 F (36.9 C) (Oral)   Resp 18   SpO2 95%   Visual Acuity Right Eye Distance:   Left Eye Distance:   Bilateral Distance:    Right Eye Near:   Left Eye Near:    Bilateral Near:     Physical Exam General appearance: alert, Ill-appearing, no distress Head: Normocephalic, without obvious abnormality, atraumatic ENT: External ears normal, Nares mucosal edema, congestion, oropharynx  w/o exudate Respiratory: Respirations even , unlabored, coarse lung sound, expiratory wheeze Heart: rate and rhythm normal. No gallop or murmurs noted on exam  Abdomen: BS +, no distention, no rebound tenderness, or no mass Extremities: No gross deformities Skin: Skin color, texture, turgor normal. No rashes seen  Psych: Appropriate mood and affect. Neurologic: GCS 15, normal, coordination, normal gait   UC Treatments / Results  Labs (all labs ordered are listed, but only abnormal results are displayed) Labs Reviewed - No data to display  EKG   Radiology No results  found.  Procedures Procedures (including critical care time)  Medications Ordered in UC Medications - No data to display  Initial Impression / Assessment and Plan / UC Course  I have reviewed the triage vital signs and the nursing notes.  Pertinent labs & imaging results that were available during my care of the patient were reviewed by me and considered in my medical decision making (see chart for details).    Acute bronchitis  with wheezing.  Patient initially oxygen level was 90% on arrival however after sitting and taking a few deep breaths remove the mass oxygen level was maintained between 94 and 95%.  Patient showed no evidence of any respiratory distress.  Wheezes noted bilaterally on exam.  Treatment as follows doxycycline twice daily for 10 days and prednisone 40 mg twice daily for 5 days.  Continue use of albuterol inhaler as needed.  Promethazine DM as needed for cough.  Strict ER precautions given if any shortness of breath develops. Follow-up with PCP as needed. Final Clinical Impressions(s) / UC Diagnoses   Final diagnoses:  Acute bronchitis, unspecified organism  Wheezing   Discharge Instructions   None    ED Prescriptions    Medication Sig Dispense Auth. Provider   predniSONE (DELTASONE) 20 MG tablet Take 2 tablets (40 mg total) by mouth daily with breakfast. 10 tablet Scot Jun, FNP   promethazine-dextromethorphan (PROMETHAZINE-DM) 6.25-15 MG/5ML syrup Take 5 mLs by mouth 4 (four) times daily as needed for cough. 180 mL Scot Jun, FNP   doxycycline (VIBRAMYCIN) 100 MG capsule Take 1 capsule (100 mg total) by mouth 2 (two) times daily. 20 capsule Scot Jun, FNP     PDMP not reviewed this encounter.   Scot Jun, FNP 09/28/20 1009

## 2020-09-28 NOTE — ED Triage Notes (Signed)
Pt c/o cough and chest congestion x2wks, worse at night. Denies SOB.

## 2020-09-28 NOTE — Discharge Instructions (Addendum)
Continue albuterol inhaler as needed.  Start doxycycline 100 mg twice daily for the next 10 days.  This is treatment for your acute bronchitis.  Start prednisone 40 mg once daily for the next 5 days to relieve chest congestion and wheezing.  Promethazine DM may take up to 4 times daily as needed for coughing.  If any of your symptoms worsen or you develop fever return immediately for further work-up and evaluation.  If you develop any shortness of breath or persistent wheezing that does not resolve with your inhaler go immediately to the nearest emergency department for evaluation.

## 2020-11-15 ENCOUNTER — Ambulatory Visit: Payer: Self-pay | Admitting: Family Medicine

## 2020-11-15 DIAGNOSIS — Z0289 Encounter for other administrative examinations: Secondary | ICD-10-CM

## 2020-12-30 ENCOUNTER — Other Ambulatory Visit: Payer: Self-pay | Admitting: Family Medicine

## 2020-12-30 DIAGNOSIS — R062 Wheezing: Secondary | ICD-10-CM

## 2021-01-01 ENCOUNTER — Other Ambulatory Visit: Payer: Self-pay | Admitting: Family Medicine

## 2021-01-01 DIAGNOSIS — R062 Wheezing: Secondary | ICD-10-CM

## 2021-01-02 ENCOUNTER — Ambulatory Visit: Payer: BC Managed Care – PPO | Admitting: Family

## 2021-01-02 ENCOUNTER — Encounter: Payer: Self-pay | Admitting: Family

## 2021-01-02 ENCOUNTER — Other Ambulatory Visit: Payer: Self-pay

## 2021-01-02 VITALS — BP 127/87 | HR 92 | Temp 98.0°F | Resp 16 | Ht 67.99 in | Wt 178.0 lb

## 2021-01-02 DIAGNOSIS — K219 Gastro-esophageal reflux disease without esophagitis: Secondary | ICD-10-CM | POA: Diagnosis not present

## 2021-01-02 DIAGNOSIS — I10 Essential (primary) hypertension: Secondary | ICD-10-CM

## 2021-01-02 DIAGNOSIS — Z7689 Persons encountering health services in other specified circumstances: Secondary | ICD-10-CM | POA: Diagnosis not present

## 2021-01-02 MED ORDER — AMLODIPINE BESYLATE 10 MG PO TABS
10.0000 mg | ORAL_TABLET | Freq: Every day | ORAL | 0 refills | Status: DC
Start: 2021-01-02 — End: 2021-03-07

## 2021-01-02 MED ORDER — OMEPRAZOLE 40 MG PO CPDR: DELAYED_RELEASE_CAPSULE | ORAL | 0 refills | Status: DC

## 2021-01-02 NOTE — Progress Notes (Signed)
Subjective:    Christina [name] - 75 y.o. female MRN [id number]  Date of birth: October 11, 1945  HPI  Christina [name] is to establish care. Patient has a PMH significant for essential hypertension, primary cancer of right upper lobe of lung, stage III squamous cell carcinoma of right lung, gastroesophageal reflux disease, colon polyps, thyromegaly, hyperlipidemia, and seasonal allergies.   Current issues and/or concerns: HYPERTENSION: Currently taking: see medication list Med Adherence: [x]  Yes    []  No Medication side effects: []  Yes    [x]  No Adherence with salt restriction (low-salt diet): [x]  Yes    []  No Exercise: Yes [x]  No []  Home Monitoring?: [x]  Yes    []  No Monitoring Frequency: [x]  Yes    []  No Home BP results range: [x]  Yes, 120's-130's/70's-80's SOB? []  Yes    [x]  No Chest Pain?: []  Yes    [x]  No  2. ACID REFLUX: Caused by various foods and/or drinks. Requesting refills of Omeprazole.  ROS per HPI    Health Maintenance:  Health Maintenance Due  Topic Date Due   Zoster Vaccines- Shingrix (1 of 2) Never done   MAMMOGRAM  07/11/2018   COVID-19 Vaccine (4 - Booster for [employer] series) 07/26/2020     Past Medical History: Patient Active Problem List   Diagnosis Date Noted   Encounter for antineoplastic immunotherapy 10/06/2018   Stage III squamous cell carcinoma of right lung ([place]) 07/06/2018   Goals of care, counseling/discussion 07/06/2018   Encounter for antineoplastic chemotherapy 07/06/2018   Primary cancer of right upper lobe of lung ([place]) 06/18/2018   Acute URI 05/25/2018   Seasonal allergies 10/06/2017   Thyromegaly 07/01/2017   Colon polyps 04/02/2017   Former smoker 09/27/2016   Cough 04/25/2016   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   GERD (gastroesophageal reflux disease) 08/10/2013   Hyperlipidemia 06/23/2013   Essential hypertension 07/23/2012   Social History   reports that she has quit smoking. Her smoking use included cigarettes.  She has a 25.00 pack-year smoking history. She has never used smokeless tobacco. She reports that she does not drink alcohol and does not use drugs.   Family History  family history includes Cancer in her father; Heart disease in her mother; Hypertension in her mother, sister, and sister; Leukemia in her father.   Medications: reviewed and updated   Objective:   Physical Exam BP 127/87 (BP Location: Left Arm, Patient Position: Sitting, Cuff Size: Normal)   Pulse 92   Temp 98 F (36.7 C)   Resp 16   Ht 5' 7.99" (1.727 m)   Wt 178 lb (80.7 kg)   SpO2 94%   BMI 27.07 kg/m  Physical Exam HENT:     Head: Normocephalic and atraumatic.  Eyes:     Extraocular Movements: Extraocular movements intact.     Conjunctiva/sclera: Conjunctivae normal.     Pupils: Pupils are equal, round, and reactive to light.  Cardiovascular:     Rate and Rhythm: Normal rate and regular rhythm.     Pulses: Normal pulses.     Heart sounds: Normal heart sounds.  Pulmonary:     Effort: Pulmonary effort is normal.     Breath sounds: Normal breath sounds.  Musculoskeletal:     Cervical back: Normal range of motion and neck supple.  Neurological:     General: No focal deficit present.     Mental Status: She is alert and oriented to person, place, and time.  Psychiatric:  Mood and Affect: Mood normal.        Behavior: Behavior normal.      Assessment & Plan:  1. Encounter to establish care: - Patient presents today to establish care.  - Return for annual physical examination, labs, and health maintenance. Arrive fasting meaning having no food for at least 8 hours prior to appointment. You may have only water or black coffee. Please take scheduled medications as normal.  2. Essential hypertension: - Continue Amlodipine as prescribed.  - Counseled on blood pressure goal of less than 140/90, low-sodium, DASH diet, medication compliance, 150 minutes of moderate intensity exercise per week as tolerated.  Discussed medication compliance, adverse effects. - Follow-up with primary provider in 3 months or sooner if needed. - amLODipine (NORVASC) 10 MG tablet; Take 1 tablet (10 mg total) by mouth daily.  Dispense: 90 tablet; Refill: 0  3. Gastroesophageal reflux disease without esophagitis: - Continue Omeprazole as prescribed.  - Follow-up with primary provider as scheduled.  - omeprazole (PRILOSEC) 40 MG capsule; TAKE 1 CAPSULE(40 MG) BY MOUTH DAILY  Dispense: 90 capsule; Refill: 0    Patient was given clear instructions to go to Emergency Department or return to medical center if symptoms don't improve, worsen, or new problems develop.The patient verbalized understanding.  I discussed the assessment and treatment plan with the patient. The patient was provided an opportunity to ask questions and all were answered. The patient agreed with the plan and demonstrated an understanding of the instructions.   The patient was advised to call back or seek an in-person evaluation if the symptoms worsen or if the condition fails to improve as anticipated.    Durene Fruits, NP 01/03/2021, 1:49 AM Primary Care at [facility]

## 2021-01-02 NOTE — Progress Notes (Signed)
Pt presents to establish care, she has no other concerns  Needs medication refills on amlodipine and omeprazole

## 2021-01-17 ENCOUNTER — Other Ambulatory Visit: Payer: Self-pay | Admitting: Family Medicine

## 2021-01-17 DIAGNOSIS — Z5181 Encounter for therapeutic drug level monitoring: Secondary | ICD-10-CM

## 2021-01-29 ENCOUNTER — Encounter: Payer: BC Managed Care – PPO | Admitting: Family

## 2021-02-13 ENCOUNTER — Telehealth: Payer: Self-pay | Admitting: Internal Medicine

## 2021-02-13 NOTE — Telephone Encounter (Signed)
R/s 10/4 appt due to provider pal. Called and left msg. Mailed printout

## 2021-03-03 NOTE — Progress Notes (Signed)
Patient ID: EMMERIE BATTAGLIA, female    DOB: [birth year]  MRN: [id number]  CC: Annual Physical Exam  Subjective: Christina [name] is a 75 y.o. female who presents for annual physical exam.   Her concerns today include:  Requesting Albuterol inhaler refill to assist with seasonal allergies and weather changing. Using inhaler infrequently.  Doing well on Amlodipine for high blood pressure. No issues/concerns.  Would like screening for shrimp allergy.   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) 07/06/2018   Goals of care, counseling/discussion 07/06/2018   Encounter for antineoplastic chemotherapy 07/06/2018   Primary cancer of right upper lobe of lung (HCC) 06/18/2018   Acute URI 05/25/2018   Seasonal allergies 10/06/2017   Thyromegaly 07/01/2017   Colon polyps 04/02/2017   Former smoker 09/27/2016   Smoker 09/27/2016   Cough 04/25/2016   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   Angioedema 08/04/2014   GERD (gastroesophageal reflux disease) 08/10/2013   Other and unspecified hyperlipidemia 06/23/2013   Essential hypertension 07/23/2012     Current Outpatient Medications on File Prior to Visit  Medication Sig Dispense Refill   aspirin EC 81 MG tablet Take 81 mg by mouth daily.     omeprazole (PRILOSEC) 40 MG capsule TAKE 1 CAPSULE(40 MG) BY MOUTH DAILY 90 capsule 0   potassium chloride (KLOR-CON) 10 MEQ tablet TAKE 1 TABLET(10 MEQ) BY MOUTH TWICE DAILY 60 tablet 6   sucralfate (CARAFATE) 1 g tablet Take 1 tablet (1 g total) by mouth 4 (four) times daily as needed (stomach acid). 120 tablet 6   No current facility-administered medications on file prior to visit.    Allergies  Allergen Reactions   Coconut Oil Hives   Codeine Other (See Comments)    Upsets stomach really bad   Shrimp [Shellfish Allergy] Hives    Trandolapril Swelling    Swelling of the face/lips 07/2014    Social History   Socioeconomic History   Marital status: Married    Spouse name: Chrissie Noa   Number of children: 1   Years of education: 12th grade   Highest education level: Not on file  Occupational History   Occupation: school housekeeping    Comment: part-time  Tobacco Use   Smoking status: Former    Packs/day: 0.50    Years: 50.00    Pack years: 25.00    Types: Cigarettes   Smokeless tobacco: Never   Tobacco comments:    Previously quit in 2015 but has started smoking again   Vaping Use   Vaping Use: Never used  Substance and Sexual Activity   Alcohol use: No    Alcohol/week: 0.0 standard drinks   Drug use: No   Sexual activity: Not Currently  Other Topics Concern   Not on file  Social History Narrative   Lives with her husband. Her daughter also lives in [place] with her husband and 3 children.   Social Determinants of Health   Financial Resource Strain: Not on file  Food Insecurity: Not on file  Transportation Needs: Not on file  Physical Activity: Not on file  Stress: Not on file  Social Connections: Not on file  Intimate Partner Violence: Not on file    Family History  Problem Relation Age of Onset   Hypertension Mother    Heart disease Mother    Leukemia Father    Cancer Father  Hypertension Sister    Hypertension Sister    Colon cancer Neg Hx    Colon polyps Neg Hx    Stomach cancer Neg Hx    Esophageal cancer Neg Hx     Past Surgical History:  Procedure Laterality Date   CESAREAN SECTION     1 time   COLONOSCOPY     POLYPECTOMY     VIDEO BRONCHOSCOPY WITH ENDOBRONCHIAL NAVIGATION N/A 06/24/2018   Procedure: VIDEO BRONCHOSCOPY WITH ENDOBRONCHIAL NAVIGATION with biopsies, right upper lung lobe;  Surgeon: Melrose Nakayama, MD;  Location: [facility] OR;  Service: Thoracic;  Laterality: N/A;   VIDEO BRONCHOSCOPY WITH ENDOBRONCHIAL ULTRASOUND N/A 06/24/2018   Procedure: VIDEO  BRONCHOSCOPY WITH ENDOBRONCHIAL ULTRASOUND, right upper lung lobe;  Surgeon: Melrose Nakayama, MD;  Location: [facility] OR;  Service: Thoracic;  Laterality: N/A;    ROS: Review of Systems Negative except as stated above  PHYSICAL EXAM: BP 131/87 (BP Location: Left Arm, Patient Position: Sitting, Cuff Size: Normal)   Pulse 85   Temp 97.7 F (36.5 C)   Resp 18   Ht 5' 7.99" (1.727 m)   Wt 176 lb 12.8 oz (80.2 kg)   SpO2 98%   BMI 26.89 kg/m   Physical Exam HENT:     Head: Normocephalic and atraumatic.     Right Ear: Tympanic membrane, ear canal and external ear normal.     Left Ear: Tympanic membrane, ear canal and external ear normal.  Eyes:     Extraocular Movements: Extraocular movements intact.     Conjunctiva/sclera: Conjunctivae normal.     Pupils: Pupils are equal, round, and reactive to light.  Cardiovascular:     Rate and Rhythm: Normal rate and regular rhythm.     Pulses: Normal pulses.     Heart sounds: Normal heart sounds.  Pulmonary:     Effort: Pulmonary effort is normal.     Breath sounds: Normal breath sounds.  Chest:     Comments: Patient declined exam.  Abdominal:     General: Bowel sounds are normal.     Palpations: Abdomen is soft.  Genitourinary:    Comments: Patient declined exam.  Musculoskeletal:        General: Normal range of motion.     Cervical back: Normal range of motion and neck supple.  Skin:    General: Skin is warm and dry.     Capillary Refill: Capillary refill takes less than 2 seconds.  Neurological:     General: No focal deficit present.     Mental Status: She is alert and oriented to person, place, and time.  Psychiatric:        Mood and Affect: Mood normal.        Behavior: Behavior normal.   ASSESSMENT AND PLAN: 1. Annual physical exam: - Counseled on 150 minutes of exercise per week as tolerated, healthy eating (including decreased daily intake of saturated fats, cholesterol, added sugars, sodium), STI prevention, and  routine healthcare maintenance.  2. Screening for metabolic disorder: - YME15+AXEN to check kidney function, liver function, and electrolyte balance.  - CMP14+EGFR  3. Screening for deficiency anemia: - CBC to screen for anemia. - CBC  4. Thyroid disorder screen: - TSH to check thyroid function.  - TSH  5. Encounter for screening mammogram for malignant neoplasm of breast: - Referral for breast cancer screening by mammogram.  - MM Digital Screening; Future  6. Essential hypertension: - Continue Amlodipine as prescribed.  - Counseled on blood pressure  goal of less than 130/80, low-sodium, DASH diet, medication compliance, 150 minutes of moderate intensity exercise per week as tolerated. Discussed medication compliance, adverse effects. - Follow-up with primary provider in 3 months or sooner if needed.  - amLODipine (NORVASC) 10 MG tablet; Take 1 tablet (10 mg total) by mouth daily.  Dispense: 90 tablet; Refill: 0  7. Hyperlipidemia, unspecified hyperlipidemia type: -Practice low-fat heart healthy diet and at least 150 minutes of moderate intensity exercise weekly as tolerated.  - Continue Atorvastatin as prescribed.  - Lipid panel to screen for level of cholesterol control. - Lipid panel - atorvastatin (LIPITOR) 40 MG tablet; TAKE 1 TABLET BY MOUTH DAILY  Dispense: 90 tablet; Refill: 2  8. Prediabetes: - Newly diagnosed.  - Hemoglobin A1c today 5.8%. no medication needed at the moment.  - Discussed the importance of healthy eating habits, low-carbohydrate diet, low-sugar diet, and regular aerobic exercise (at least 150 minutes a week as tolerated) to achieve or maintain control of prediabetes. - Follow-up with primary provider in 6 months or sooner if needed.  - POCT glycosylated hemoglobin (Hb A1C)  9. Seasonal allergies: 10. Wheezing: - Continue Albuterol as prescribed.  - Follow-up with primary provider as scheduled.  - albuterol (VENTOLIN HFA) 108 (90 Base) MCG/ACT  inhaler; INHALE 1 TO 2 PUFFS INTO THE LUNGS EVERY 6 HOURS AS NEEDED FOR WHEEZING OR SHORTNESS OF BREATH  Dispense: 6.7 g; Refill: 1  11. Encounter for allergy testing: - Screening for sensitivity.  - Shrimp IgE  12. Flu vaccine need: - Patient declined.   Patient was given the opportunity to ask questions.  Patient verbalized understanding of the plan and was able to repeat key elements of the plan. Patient was given clear instructions to go to Emergency Department or return to medical center if symptoms don't improve, worsen, or new problems develop.The patient verbalized understanding.   Orders Placed This Encounter  Procedures   MM Digital Screening   CBC   Lipid panel   TSH   CMP14+EGFR   Shrimp IgE   POCT glycosylated hemoglobin (Hb A1C)     Requested Prescriptions   Signed Prescriptions Disp Refills   amLODipine (NORVASC) 10 MG tablet 90 tablet 0    Sig: Take 1 tablet (10 mg total) by mouth daily.   albuterol (VENTOLIN HFA) 108 (90 Base) MCG/ACT inhaler 6.7 g 1    Sig: INHALE 1 TO 2 PUFFS INTO THE LUNGS EVERY 6 HOURS AS NEEDED FOR WHEEZING OR SHORTNESS OF BREATH   atorvastatin (LIPITOR) 40 MG tablet 90 tablet 2    Sig: TAKE 1 TABLET BY MOUTH DAILY    Return in about 3 months (around 06/06/2021) for Follow-Up or next available hypertension and 6 months prediabetes.  [name], NP

## 2021-03-07 ENCOUNTER — Ambulatory Visit (INDEPENDENT_AMBULATORY_CARE_PROVIDER_SITE_OTHER): Payer: BC Managed Care – PPO | Admitting: Family

## 2021-03-07 ENCOUNTER — Other Ambulatory Visit: Payer: Self-pay

## 2021-03-07 ENCOUNTER — Encounter: Payer: Self-pay | Admitting: Family

## 2021-03-07 VITALS — BP 131/87 | HR 85 | Temp 97.7°F | Resp 18 | Ht 67.99 in | Wt 176.8 lb

## 2021-03-07 DIAGNOSIS — Z1231 Encounter for screening mammogram for malignant neoplasm of breast: Secondary | ICD-10-CM

## 2021-03-07 DIAGNOSIS — Z23 Encounter for immunization: Secondary | ICD-10-CM

## 2021-03-07 DIAGNOSIS — R7303 Prediabetes: Secondary | ICD-10-CM | POA: Diagnosis not present

## 2021-03-07 DIAGNOSIS — Z1329 Encounter for screening for other suspected endocrine disorder: Secondary | ICD-10-CM | POA: Diagnosis not present

## 2021-03-07 DIAGNOSIS — Z0000 Encounter for general adult medical examination without abnormal findings: Secondary | ICD-10-CM | POA: Diagnosis not present

## 2021-03-07 DIAGNOSIS — Z13228 Encounter for screening for other metabolic disorders: Secondary | ICD-10-CM

## 2021-03-07 DIAGNOSIS — Z130 Encounter for screening for diseases of the blood and blood-forming organs and certain disorders involving the immune mechanism: Secondary | ICD-10-CM | POA: Diagnosis not present

## 2021-03-07 DIAGNOSIS — E785 Hyperlipidemia, unspecified: Secondary | ICD-10-CM

## 2021-03-07 DIAGNOSIS — Z0182 Encounter for allergy testing: Secondary | ICD-10-CM

## 2021-03-07 DIAGNOSIS — R062 Wheezing: Secondary | ICD-10-CM

## 2021-03-07 DIAGNOSIS — I10 Essential (primary) hypertension: Secondary | ICD-10-CM

## 2021-03-07 DIAGNOSIS — J302 Other seasonal allergic rhinitis: Secondary | ICD-10-CM

## 2021-03-07 LAB — POCT GLYCOSYLATED HEMOGLOBIN (HGB A1C): Hemoglobin A1C: 5.8 % — AB (ref 4.0–5.6)

## 2021-03-07 MED ORDER — ALBUTEROL SULFATE HFA 108 (90 BASE) MCG/ACT IN AERS: INHALATION_SPRAY | RESPIRATORY_TRACT | 1 refills | Status: DC

## 2021-03-07 MED ORDER — ATORVASTATIN CALCIUM 40 MG PO TABS: ORAL_TABLET | ORAL | 2 refills | Status: DC

## 2021-03-07 MED ORDER — AMLODIPINE BESYLATE 10 MG PO TABS: 10.0000 mg | ORAL_TABLET | Freq: Every day | ORAL | 0 refills | Status: DC

## 2021-03-07 NOTE — Progress Notes (Signed)
Pt presents for annual physical exam, no concerns request refill on albuterol inhaler Declined flu vaccine

## 2021-03-12 ENCOUNTER — Encounter: Payer: Self-pay | Admitting: Internal Medicine

## 2021-03-12 LAB — CBC
Hematocrit: 41.4 % (ref 34.0–46.6)
Hemoglobin: 13.8 g/dL (ref 11.1–15.9)
MCH: 29.4 pg (ref 26.6–33.0)
MCHC: 33.3 g/dL (ref 31.5–35.7)
MCV: 88 fL (ref 79–97)
Platelets: 349 10*3/uL (ref 150–450)
RBC: 4.69 x10E6/uL (ref 3.77–5.28)
RDW: 14.3 % (ref 11.7–15.4)
WBC: 6 10*3/uL (ref 3.4–10.8)

## 2021-03-12 LAB — CMP14+EGFR
ALT: 12 IU/L (ref 0–32)
AST: 17 IU/L (ref 0–40)
Albumin/Globulin Ratio: 1.8 (ref 1.2–2.2)
Albumin: 4.4 g/dL (ref 3.7–4.7)
Alkaline Phosphatase: 108 IU/L (ref 44–121)
BUN/Creatinine Ratio: 14 (ref 12–28)
BUN: 13 mg/dL (ref 8–27)
Bilirubin Total: 0.3 mg/dL (ref 0.0–1.2)
CO2: 24 mmol/L (ref 20–29)
Calcium: 9.9 mg/dL (ref 8.7–10.3)
Chloride: 102 mmol/L (ref 96–106)
Creatinine, Ser: 0.92 mg/dL (ref 0.57–1.00)
Globulin, Total: 2.5 g/dL (ref 1.5–4.5)
Glucose: 90 mg/dL (ref 65–99)
Potassium: 4.4 mmol/L (ref 3.5–5.2)
Sodium: 141 mmol/L (ref 134–144)
Total Protein: 6.9 g/dL (ref 6.0–8.5)
eGFR: 66 mL/min/{1.73_m2} (ref >59)

## 2021-03-12 LAB — LIPID PANEL
Chol/HDL Ratio: 3.7 ratio (ref 0.0–4.4)
Cholesterol, Total: 178 mg/dL (ref 100–199)
HDL: 48 mg/dL (ref >39)
LDL Chol Calc (NIH): 109 mg/dL — ABNORMAL HIGH (ref 0–99)
Triglycerides: 114 mg/dL (ref 0–149)
VLDL Cholesterol Cal: 21 mg/dL (ref 5–40)

## 2021-03-12 LAB — TSH: TSH: 1.29 u[IU]/mL (ref 0.450–4.500)

## 2021-03-12 LAB — ALLERGEN, SHRIMP F24: Shrimp IgE: 0.11 kU/L — AB

## 2021-03-12 NOTE — Progress Notes (Signed)
Kidney function normal.   Liver function normal.   Thyroid function normal.   Cholesterol normal. Continue Atorvastatin for cholesterol maintenance.   No anemia.  Prediabetes discussed in office.   Low sensitivity to shrimp. Recommendation to remain away from shrimp and or any shrimp substances.

## 2021-03-16 ENCOUNTER — Telehealth: Payer: Self-pay | Admitting: Internal Medicine

## 2021-03-16 ENCOUNTER — Inpatient Hospital Stay: Payer: BC Managed Care – PPO

## 2021-03-16 ENCOUNTER — Ambulatory Visit (HOSPITAL_COMMUNITY): Payer: BC Managed Care – PPO

## 2021-03-16 NOTE — Telephone Encounter (Signed)
Per 9/30 schedule msg, appt was rescheduled, msg was left with pt

## 2021-03-20 ENCOUNTER — Ambulatory Visit: Payer: BC Managed Care – PPO | Admitting: Internal Medicine

## 2021-03-20 ENCOUNTER — Inpatient Hospital Stay: Payer: BC Managed Care – PPO | Admitting: Internal Medicine

## 2021-03-24 ENCOUNTER — Other Ambulatory Visit: Payer: Self-pay | Admitting: Family Medicine

## 2021-03-24 DIAGNOSIS — I10 Essential (primary) hypertension: Secondary | ICD-10-CM

## 2021-03-26 ENCOUNTER — Ambulatory Visit (HOSPITAL_COMMUNITY)
Admission: RE | Admit: 2021-03-26 | Discharge: 2021-03-26 | Disposition: A | Discharge status: Home / Self Care | Payer: BC Managed Care – PPO | Source: Ambulatory Visit | Attending: Internal Medicine | Admitting: Internal Medicine

## 2021-03-26 ENCOUNTER — Encounter (HOSPITAL_COMMUNITY): Payer: Self-pay

## 2021-03-26 ENCOUNTER — Other Ambulatory Visit: Payer: Self-pay

## 2021-03-26 ENCOUNTER — Inpatient Hospital Stay: Payer: BC Managed Care – PPO | Attending: Internal Medicine

## 2021-03-26 DIAGNOSIS — R911 Solitary pulmonary nodule: Secondary | ICD-10-CM | POA: Diagnosis not present

## 2021-03-26 DIAGNOSIS — C3490 Malignant neoplasm of unspecified part of unspecified bronchus or lung: Secondary | ICD-10-CM | POA: Diagnosis not present

## 2021-03-26 DIAGNOSIS — I700 Atherosclerosis of aorta: Secondary | ICD-10-CM | POA: Diagnosis not present

## 2021-03-26 DIAGNOSIS — J439 Emphysema, unspecified: Secondary | ICD-10-CM | POA: Diagnosis not present

## 2021-03-26 LAB — CBC WITH DIFFERENTIAL (CANCER CENTER ONLY)
Abs Immature Granulocytes: 0.02 10*3/uL (ref 0.00–0.07)
Basophils Absolute: 0.1 10*3/uL (ref 0.0–0.1)
Basophils Relative: 1 %
Eosinophils Absolute: 0.1 10*3/uL (ref 0.0–0.5)
Eosinophils Relative: 2 %
HCT: 41.6 % (ref 36.0–46.0)
Hemoglobin: 13.4 g/dL (ref 12.0–15.0)
Immature Granulocytes: 0 %
Lymphocytes Relative: 27 %
Lymphs Abs: 1.5 10*3/uL (ref 0.7–4.0)
MCH: 29 pg (ref 26.0–34.0)
MCHC: 32.2 g/dL (ref 30.0–36.0)
MCV: 90 fL (ref 80.0–100.0)
Monocytes Absolute: 0.5 10*3/uL (ref 0.1–1.0)
Monocytes Relative: 9 %
Neutro Abs: 3.2 10*3/uL (ref 1.7–7.7)
Neutrophils Relative %: 61 %
Platelet Count: 309 10*3/uL (ref 150–400)
RBC: 4.62 MIL/uL (ref 3.87–5.11)
RDW: 15 % (ref 11.5–15.5)
WBC Count: 5.4 10*3/uL (ref 4.0–10.5)
nRBC: 0 % (ref 0.0–0.2)

## 2021-03-26 LAB — CMP (CANCER CENTER ONLY)
ALT: 13 U/L (ref 0–44)
AST: 15 U/L (ref 15–41)
Albumin: 3.8 g/dL (ref 3.5–5.0)
Alkaline Phosphatase: 93 U/L (ref 38–126)
Anion gap: 8 (ref 5–15)
BUN: 16 mg/dL (ref 8–23)
CO2: 26 mmol/L (ref 22–32)
Calcium: 9.4 mg/dL (ref 8.9–10.3)
Chloride: 106 mmol/L (ref 98–111)
Creatinine: 0.97 mg/dL (ref 0.44–1.00)
GFR, Estimated: >60 mL/min (ref >60)
Glucose, Bld: 85 mg/dL (ref 70–99)
Potassium: 3.8 mmol/L (ref 3.5–5.1)
Sodium: 140 mmol/L (ref 135–145)
Total Bilirubin: 0.4 mg/dL (ref 0.3–1.2)
Total Protein: 7.3 g/dL (ref 6.5–8.1)

## 2021-03-26 MED ORDER — IOHEXOL 350 MG/ML SOLN
60.0000 mL | Freq: Once | INTRAVENOUS | Status: AC | PRN
  Administered 2021-03-26: 60 mL via INTRAVENOUS

## 2021-03-28 ENCOUNTER — Telehealth: Payer: Self-pay | Admitting: Family

## 2021-03-28 DIAGNOSIS — I10 Essential (primary) hypertension: Secondary | ICD-10-CM

## 2021-03-28 NOTE — Telephone Encounter (Signed)
1) Medication(s) Requested (by name):  omeprazole (PRILOSEC) 40 MG capsule  potassium chloride (KLOR-CON) 10 MEQ tablet 2) Pharmacy of Choice:  [facility] [place], [place]   3) Special Requests:   Approved medications will be sent to the pharmacy, we will reach out if there is an issue.  Requests made after 3pm may not be addressed until the following business day!  If a patient is unsure of the name of the medication(s) please note and ask patient to call back when they are able to provide all info, do not send to responsible party until all information is available!

## 2021-04-01 ENCOUNTER — Other Ambulatory Visit: Payer: Self-pay | Admitting: Family

## 2021-04-01 DIAGNOSIS — K219 Gastro-esophageal reflux disease without esophagitis: Secondary | ICD-10-CM

## 2021-04-02 ENCOUNTER — Other Ambulatory Visit: Payer: Self-pay | Admitting: *Deleted

## 2021-04-02 ENCOUNTER — Inpatient Hospital Stay: Payer: BC Managed Care – PPO | Admitting: Internal Medicine

## 2021-04-02 ENCOUNTER — Other Ambulatory Visit: Payer: Self-pay

## 2021-04-02 VITALS — BP 140/87 | HR 91 | Temp 97.9°F | Resp 19 | Ht 67.99 in | Wt 183.2 lb

## 2021-04-02 DIAGNOSIS — K219 Gastro-esophageal reflux disease without esophagitis: Secondary | ICD-10-CM

## 2021-04-02 DIAGNOSIS — C3411 Malignant neoplasm of upper lobe, right bronchus or lung: Secondary | ICD-10-CM | POA: Diagnosis not present

## 2021-04-02 DIAGNOSIS — Z9221 Personal history of antineoplastic chemotherapy: Secondary | ICD-10-CM | POA: Insufficient documentation

## 2021-04-02 DIAGNOSIS — C3490 Malignant neoplasm of unspecified part of unspecified bronchus or lung: Secondary | ICD-10-CM | POA: Diagnosis not present

## 2021-04-02 DIAGNOSIS — Z923 Personal history of irradiation: Secondary | ICD-10-CM | POA: Diagnosis not present

## 2021-04-02 MED ORDER — OMEPRAZOLE 40 MG PO CPDR: DELAYED_RELEASE_CAPSULE | ORAL | 0 refills | Status: DC

## 2021-04-02 NOTE — Progress Notes (Signed)
[place] [phone]   [phone]  OFFICE PROGRESS NOTE  [name], NP [address] [place] [address]  DIAGNOSIS: Stage IIIB (T4, N2, M0) non-small cell lung cancer, poorly differentiated squamous cell carcinoma.  She presented with large right upper lobe lung mass in addition to right hilar and mediastinal lymphadenopathy diagnosed in January 2020.  PRIOR THERAPY:  1) Concurrent chemoradiation with weekly carboplatin for AUC of 2 and paclitaxel 45 mg/M2.  First dose July 13, 2018.  Status post 8 cycles.  Last dose was given August 31, 2018. 2) Consolidation treatment with immunotherapy with Imfinzi 10 mg/KG every 2 weeks.  First dose starts October 13, 2018.  Status post 26 cycles.  CURRENT THERAPY: Observation.  INTERVAL HISTORY: Christina [name] 75 y.o. female returns to the clinic today for 59-month follow-up visit.  The patient is feeling fine today with no concerning complaints.  She denied having any current chest pain, shortness of breath, cough or hemoptysis.  She denied having any fever or chills.  She has no nausea, vomiting, diarrhea or constipation.  She has no headache or visual changes.  She is here today for evaluation with repeat CT scan of the chest for restaging of her disease.  MEDICAL HISTORY: Past Medical History:  Diagnosis Date   Allergy    GERD (gastroesophageal reflux disease)    Hyperlipidemia    Hypertension    nscl ca dx'd 05/2018   lung cancer   Reflux    Tubulovillous adenoma of colon 2003    ALLERGIES:  is allergic to coconut oil, codeine, shrimp [shellfish allergy], and trandolapril.  MEDICATIONS:  Current Outpatient Medications  Medication Sig Dispense Refill   albuterol (VENTOLIN HFA) 108 (90 Base) MCG/ACT inhaler INHALE 1 TO 2 PUFFS INTO THE LUNGS EVERY 6 HOURS AS NEEDED FOR WHEEZING OR SHORTNESS OF BREATH 6.7 g 1   amLODipine (NORVASC) 10 MG tablet Take 1 tablet (10 mg total)  by mouth daily. 90 tablet 0   aspirin EC 81 MG tablet Take 81 mg by mouth daily.     atorvastatin (LIPITOR) 40 MG tablet TAKE 1 TABLET BY MOUTH DAILY 90 tablet 2   omeprazole (PRILOSEC) 40 MG capsule TAKE 1 CAPSULE(40 MG) BY MOUTH DAILY 90 capsule 0   potassium chloride (KLOR-CON) 10 MEQ tablet TAKE 1 TABLET(10 MEQ) BY MOUTH TWICE DAILY 60 tablet 6   sucralfate (CARAFATE) 1 g tablet Take 1 tablet (1 g total) by mouth 4 (four) times daily as needed (stomach acid). 120 tablet 6   No current facility-administered medications for this visit.    SURGICAL HISTORY:  Past Surgical History:  Procedure Laterality Date   CESAREAN SECTION     1 time   COLONOSCOPY     POLYPECTOMY     VIDEO BRONCHOSCOPY WITH ENDOBRONCHIAL NAVIGATION N/A 06/24/2018   Procedure: VIDEO BRONCHOSCOPY WITH ENDOBRONCHIAL NAVIGATION with biopsies, right upper lung lobe;  Surgeon: Melrose Nakayama, MD;  Location: [facility] OR;  Service: Thoracic;  Laterality: N/A;   VIDEO BRONCHOSCOPY WITH ENDOBRONCHIAL ULTRASOUND N/A 06/24/2018   Procedure: VIDEO BRONCHOSCOPY WITH ENDOBRONCHIAL ULTRASOUND, right upper lung lobe;  Surgeon: Melrose Nakayama, MD;  Location: [facility] OR;  Service: Thoracic;  Laterality: N/A;    REVIEW OF SYSTEMS:  A comprehensive review of systems was negative.   PHYSICAL EXAMINATION: General appearance: alert, cooperative, and no distress Head: Normocephalic, without obvious abnormality, atraumatic Neck: no adenopathy, no JVD, supple, symmetrical, trachea midline,  and thyroid not enlarged, symmetric, no tenderness/mass/nodules Lymph nodes: Cervical, supraclavicular, and axillary nodes normal. Resp: clear to auscultation bilaterally Back: symmetric, no curvature. ROM normal. No CVA tenderness. Cardio: regular rate and rhythm, S1, S2 normal, no murmur, click, rub or gallop GI: soft, non-tender; bowel sounds normal; no masses,  no organomegaly Extremities: extremities normal, atraumatic, no cyanosis or edema  ECOG  PERFORMANCE STATUS: 0 - Asymptomatic  Blood pressure 140/87, pulse 91, temperature 97.9 F (36.6 C), temperature source Tympanic, resp. rate 19, height 5' 7.99" (1.727 m), weight 183 lb 3.2 oz (83.1 kg), SpO2 98 %.  LABORATORY DATA: Lab Results  Component Value Date   WBC 5.4 03/26/2021   HGB 13.4 03/26/2021   HCT 41.6 03/26/2021   MCV 90.0 03/26/2021   PLT 309 03/26/2021      Chemistry      Component Value Date/Time   NA 140 03/26/2021 0938   NA 141 03/07/2021 1221   K 3.8 03/26/2021 0938   CL 106 03/26/2021 0938   CO2 26 03/26/2021 0938   BUN 16 03/26/2021 0938   BUN 13 03/07/2021 1221   CREATININE 0.97 03/26/2021 0938   CREATININE 0.87 03/22/2016 0908      Component Value Date/Time   CALCIUM 9.4 03/26/2021 0938   ALKPHOS 93 03/26/2021 0938   AST 15 03/26/2021 0938   ALT 13 03/26/2021 0938   BILITOT 0.4 03/26/2021 0938       RADIOGRAPHIC STUDIES: CT Chest W Contrast  Result Date: 03/27/2021 CLINICAL DATA:  Restaging non-small cell lung cancer. EXAM: CT CHEST WITH CONTRAST TECHNIQUE: Multidetector CT imaging of the chest was performed during intravenous contrast administration. CONTRAST:  42mL OMNIPAQUE IOHEXOL 350 MG/ML SOLN COMPARISON:  09/24/2020 FINDINGS: Cardiovascular: Heart size normal. No significant pericardial effusion. Aortic atherosclerosis. Coronary artery calcifications. Mediastinum/Nodes: Heterogeneous left lobe of thyroid gland nodule measures 3.4 cm, image 56/6. The trachea appears patent and is midline. Normal appearance of the esophagus. No enlarged axillary, supraclavicular, mediastinal, or hilar adenopathy. Lungs/Pleura: No pleural effusion. Moderate centrilobular emphysema with diffuse bronchial wall thickening. Changes secondary to external beam radiation identified within the right upper lobe with fibrosis, architectural distortion and bandlike area of soft tissue thickening. The area of soft tissue thickening measures 1.3 cm, image 58/5. Stable from  the previous exam. No new findings identified. Tiny 2 mm nodule within the superior segment of right lower lobe is unchanged, image 73/5. Upper Abdomen: Tiny low-density structure within right lobe of liver is unchanged measuring 4 mm, image 151/2. No acute abnormality identified within the imaged portions of the upper abdomen. Musculoskeletal: No chest wall abnormality. No acute or significant osseous findings. IMPRESSION: 1. Stable CT of the chest. No findings to suggest local tumor recurrence or metastatic disease. 2. Heterogeneous left lobe of thyroid gland nodule measures 3.4 cm. Recommend thyroid US. Reference: J Am Coll Radiol. 2015 Feb;12(2): 143-50 3. Aortic Atherosclerosis (ICD10-I70.0) and Emphysema (ICD10-J43.9). Electronically Signed   By: Kerby Moors M.D.   On: 03/27/2021 10:51     ASSESSMENT AND PLAN: This is a very pleasant 75 years old African-American female recently diagnosed with a stage IIIB (T4, N2, M0) non-small cell lung cancer, poorly differentiated squamous cell carcinoma presented with right upper lobe lung mass in addition to right hilar and mediastinal lymphadenopathy. The patient underwent concurrent chemoradiation with weekly carboplatin and paclitaxel status post 8 cycles. She has partial response to this treatment. She also completed a course of consolidation treatment with immunotherapy with Imfinzi every 2 weeks  status post 26 cycles. She is currently on observation and she has been doing fine with no concerning complaints. She had repeat CT scan of the chest performed recently.  I personally and independently reviewed the scan and discussed the results with the patient today. Her scan showed no concerning findings for disease recurrence or metastasis. I recommended for her to continue on observation with repeat CT scan of the chest in 6 months. The patient was advised to call immediately if she has any other concerning symptoms in the interval. The patient voices  understanding of current disease status and treatment options and is in agreement with the current care plan.  All questions were answered. The patient knows to call the clinic with any problems, questions or concerns. We can certainly see the patient much sooner if necessary.  Disclaimer: This note was dictated with voice recognition software. Similar sounding words can inadvertently be transcribed and may not be corrected upon review.

## 2021-04-03 ENCOUNTER — Telehealth: Payer: Self-pay | Admitting: Internal Medicine

## 2021-04-03 MED ORDER — POTASSIUM CHLORIDE CRYS ER 10 MEQ PO TBCR: EXTENDED_RELEASE_TABLET | ORAL | 3 refills | Status: DC

## 2021-04-03 NOTE — Telephone Encounter (Signed)
Left message with follow-up appointments per 10/17 los.

## 2021-04-12 ENCOUNTER — Other Ambulatory Visit: Payer: Self-pay | Admitting: Family

## 2021-04-12 DIAGNOSIS — I10 Essential (primary) hypertension: Secondary | ICD-10-CM

## 2021-05-15 ENCOUNTER — Other Ambulatory Visit: Payer: Self-pay

## 2021-05-15 ENCOUNTER — Ambulatory Visit
Admission: EM | Admit: 2021-05-15 | Discharge: 2021-05-15 | Disposition: A | Discharge status: Home / Self Care | Payer: BC Managed Care – PPO | Attending: Internal Medicine | Admitting: Internal Medicine

## 2021-05-15 DIAGNOSIS — R051 Acute cough: Secondary | ICD-10-CM | POA: Diagnosis not present

## 2021-05-15 DIAGNOSIS — J069 Acute upper respiratory infection, unspecified: Secondary | ICD-10-CM

## 2021-05-15 MED ORDER — DOXYCYCLINE HYCLATE 100 MG PO CAPS: 100.0000 mg | ORAL_CAPSULE | Freq: Two times a day (BID) | ORAL | 0 refills | Status: DC

## 2021-05-15 MED ORDER — PREDNISONE 20 MG PO TABS: 40.0000 mg | ORAL_TABLET | Freq: Every day | ORAL | 0 refills | Status: AC

## 2021-05-15 NOTE — ED Triage Notes (Signed)
One week h/o cough, congestion and sinus pressure. Has tried otc meds without relief. Denies n/v/d.

## 2021-05-15 NOTE — Discharge Instructions (Addendum)
You are being prescribed an antibiotic and a steroid to help alleviate your symptoms.  Please go the hospital shortness of breath develops.  Follow-up if symptoms persist.

## 2021-05-15 NOTE — ED Provider Notes (Signed)
[facility]    CSN: [id number] Arrival date & time: 05/15/21  1194      History   Chief Complaint Chief Complaint  Patient presents with   Nasal Congestion   Cough    HPI Christina [name] is a 75 y.o. female.   Patient presents with 1 week history of nonproductive cough, nasal congestion, sinus pressure.  Patient denies any known fevers or sick contacts.  Patient denies chest pain, shortness of breath, nausea, vomiting, diarrhea, ear pain, sore throat.  Patient has taken Coricidin over-the-counter with no improvement in symptoms.  Patient had a CT scan on 03/27/2021 that showed stable CT and evidence of emphysema.   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[place]) 07/06/2018   Goals of care, counseling/discussion 07/06/2018   Encounter for antineoplastic chemotherapy 07/06/2018   Primary cancer of right upper lobe of lung ([place]) 06/18/2018   Acute URI 05/25/2018   Seasonal allergies 10/06/2017   Thyromegaly 07/01/2017   Colon polyps 04/02/2017   Former smoker 09/27/2016   Smoker 09/27/2016   Cough 04/25/2016   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   Angioedema 08/04/2014   GERD (gastroesophageal reflux disease) 08/10/2013   Other and unspecified hyperlipidemia 06/23/2013   Essential hypertension 07/23/2012    Past Surgical History:  Procedure Laterality Date   CESAREAN SECTION     1 time   COLONOSCOPY     POLYPECTOMY     VIDEO BRONCHOSCOPY WITH ENDOBRONCHIAL NAVIGATION N/A 06/24/2018   Procedure: VIDEO BRONCHOSCOPY WITH ENDOBRONCHIAL NAVIGATION with biopsies, right upper lung  lobe;  Surgeon: Melrose Nakayama, MD;  Location: [facility];  Service: Thoracic;  Laterality: N/A;   VIDEO BRONCHOSCOPY WITH ENDOBRONCHIAL ULTRASOUND N/A 06/24/2018   Procedure: VIDEO BRONCHOSCOPY WITH ENDOBRONCHIAL ULTRASOUND, right upper lung lobe;  Surgeon: Melrose Nakayama, MD;  Location: [facility] OR;  Service: Thoracic;  Laterality: N/A;    OB History   No obstetric history on file.      Home Medications    Prior to Admission medications   Medication Sig Start Date End Date Taking? Authorizing Provider  doxycycline (VIBRAMYCIN) 100 MG capsule Take 1 capsule (100 mg total) by mouth 2 (two) times daily. 05/15/21  Yes Traver Meckes, Hildred Alamin E, FNP  predniSONE (DELTASONE) 20 MG tablet Take 2 tablets (40 mg total) by mouth daily for 5 days. 05/15/21 05/20/21 Yes Rawson Minix, Hildred Alamin E, FNP  albuterol (VENTOLIN HFA) 108 (90 Base) MCG/ACT inhaler INHALE 1 TO 2 PUFFS INTO THE LUNGS EVERY 6 HOURS AS NEEDED FOR WHEEZING OR SHORTNESS OF BREATH 03/07/21   [name], NP  amLODipine (NORVASC) 10 MG tablet TAKE 1 TABLET(10 MG) BY MOUTH DAILY 04/19/21   Dorna Mai, MD  aspirin EC 81 MG tablet Take 81 mg by mouth daily.    [provider]  atorvastatin (LIPITOR) 40 MG tablet TAKE 1 TABLET BY MOUTH DAILY 03/07/21   [name], NP  omeprazole (PRILOSEC) 40 MG capsule TAKE 1 CAPSULE(40 MG) BY MOUTH DAILY 04/02/21   [name], NP  potassium chloride (KLOR-CON) 10 MEQ tablet  TAKE 1 TABLET(10 MEQ) BY MOUTH TWICE DAILY 04/03/21   [name], NP  sucralfate (CARAFATE) 1 g tablet Take 1 tablet (1 g total) by mouth 4 (four) times daily as needed (stomach acid). 05/17/20   Wendie Agreste, MD    Family History Family History  Problem Relation Age of Onset   Hypertension Mother    Heart disease Mother    Leukemia Father    Cancer Father    Hypertension Sister    Hypertension Sister    Colon cancer Neg Hx    Colon polyps Neg Hx    Stomach cancer Neg Hx    Esophageal cancer Neg Hx      Social History Social History   Tobacco Use   Smoking status: Former    Packs/day: 0.50    Years: 50.00    Pack years: 25.00    Types: Cigarettes   Smokeless tobacco: Never   Tobacco comments:    Previously quit in 2015 but has started smoking again   Vaping Use   Vaping Use: Never used  Substance Use Topics   Alcohol use: No    Alcohol/week: 0.0 standard drinks   Drug use: No     Allergies   Coconut oil, Codeine, Shrimp [shellfish allergy], and Trandolapril   Review of Systems Review of Systems Per HPI  Physical Exam Triage Vital Signs ED Triage Vitals  Enc Vitals Group     BP 05/15/21 0944 126/82     Pulse Rate 05/15/21 0944 82     Resp 05/15/21 0944 18     Temp 05/15/21 0944 98 F (36.7 C)     Temp Source 05/15/21 0944 Oral     SpO2 05/15/21 0944 96 %     Weight --      Height --      Head Circumference --      Peak Flow --      Pain Score 05/15/21 0945 0     Pain Loc --      Pain Edu? --      Excl. in [place]? --    No data found.  Updated Vital Signs BP 126/82 (BP Location: Left Arm)   Pulse 82   Temp 98 F (36.7 C) (Oral)   Resp 18   SpO2 96%   Visual Acuity Right Eye Distance:   Left Eye Distance:   Bilateral Distance:    Right Eye Near:   Left Eye Near:    Bilateral Near:     Physical Exam Constitutional:      General: She is not in acute distress.    Appearance: Normal appearance. She is not toxic-appearing or diaphoretic.  HENT:     Head: Normocephalic and atraumatic.     Right Ear: Tympanic membrane and ear canal normal.     Left Ear: Tympanic membrane and ear canal normal.     Nose: Congestion present.     Mouth/Throat:     Mouth: Mucous membranes are moist.     Pharynx: No posterior oropharyngeal erythema.  Eyes:     Extraocular Movements: Extraocular movements intact.     Conjunctiva/sclera: Conjunctivae normal.     Pupils: Pupils are equal, round, and reactive to light.  Cardiovascular:     Rate and Rhythm: Normal  rate and regular rhythm.     Pulses: Normal pulses.     Heart sounds: Normal heart sounds.  Pulmonary:     Effort: Pulmonary effort is normal. No respiratory distress.  Breath sounds: Normal breath sounds. No stridor. No wheezing, rhonchi or rales.  Abdominal:     General: Abdomen is flat. Bowel sounds are normal.     Palpations: Abdomen is soft.  Musculoskeletal:        General: Normal range of motion.     Cervical back: Normal range of motion.  Skin:    General: Skin is warm and dry.  Neurological:     General: No focal deficit present.     Mental Status: She is alert and oriented to person, place, and time. Mental status is at baseline.  Psychiatric:        Mood and Affect: Mood normal.        Behavior: Behavior normal.     UC Treatments / Results  Labs (all labs ordered are listed, but only abnormal results are displayed) Labs Reviewed  NOVEL CORONAVIRUS, NAA    EKG   Radiology No results found.  Procedures Procedures (including critical care time)  Medications Ordered in UC Medications - No data to display  Initial Impression / Assessment and Plan / UC Course  I have reviewed the triage vital signs and the nursing notes.  Pertinent labs & imaging results that were available during my care of the patient were reviewed by me and considered in my medical decision making (see chart for details).     Suggested chest x-ray to patient given duration of cough but patient declined.  Will treat with doxycycline x10 days and prednisone x5 days.  Discussed supportive care and symptom management with patient.  COVID-19 test pending.  Discussed return precautions.  No red flags on exam.  Patient verbalized understanding and was agreeable with plan. Final Clinical Impressions(s) / UC Diagnoses   Final diagnoses:  Acute upper respiratory infection  Acute cough     Discharge Instructions      You are being prescribed an antibiotic and a steroid to help alleviate  your symptoms.  Please go the hospital shortness of breath develops.  Follow-up if symptoms persist.    ED Prescriptions     Medication Sig Dispense Auth. Provider   doxycycline (VIBRAMYCIN) 100 MG capsule Take 1 capsule (100 mg total) by mouth 2 (two) times daily. 20 capsule [place], [place] E, [place]   predniSONE (DELTASONE) 20 MG tablet Take 2 tablets (40 mg total) by mouth daily for 5 days. 10 tablet Teodora Medici, [place]      PDMP not reviewed this encounter.   Teodora Medici, [place] 05/15/21 1024

## 2021-05-16 LAB — NOVEL CORONAVIRUS, NAA: SARS-CoV-2, NAA: NOT DETECTED

## 2021-05-16 LAB — SARS-COV-2, NAA 2 DAY TAT

## 2021-05-23 ENCOUNTER — Other Ambulatory Visit: Payer: Self-pay | Admitting: Family Medicine

## 2021-05-23 DIAGNOSIS — K219 Gastro-esophageal reflux disease without esophagitis: Secondary | ICD-10-CM

## 2021-05-28 ENCOUNTER — Telehealth: Payer: BC Managed Care – PPO | Admitting: Emergency Medicine

## 2021-05-28 DIAGNOSIS — J069 Acute upper respiratory infection, unspecified: Secondary | ICD-10-CM

## 2021-05-28 MED ORDER — FLUTICASONE PROPIONATE 50 MCG/ACT NA SUSP: 2.0000 | Freq: Every day | NASAL | 6 refills | Status: DC

## 2021-05-28 NOTE — Patient Instructions (Addendum)
  [name], thank you for joining Carvel Getting, NP for today's virtual visit.  While this provider is not your primary care provider (PCP), if your PCP is located in our provider database this encounter information will be shared with them immediately following your visit.  Consent: (Patient) [name] provided verbal consent for this virtual visit at the beginning of the encounter.  Current Medications:  Current Outpatient Medications:    fluticasone (FLONASE) 50 MCG/ACT nasal spray, Place 2 sprays into both nostrils daily., Disp: 16 g, Rfl: 6   albuterol (VENTOLIN HFA) 108 (90 Base) MCG/ACT inhaler, INHALE 1 TO 2 PUFFS INTO THE LUNGS EVERY 6 HOURS AS NEEDED FOR WHEEZING OR SHORTNESS OF BREATH, Disp: 6.7 g, Rfl: 1   amLODipine (NORVASC) 10 MG tablet, TAKE 1 TABLET(10 MG) BY MOUTH DAILY, Disp: 90 tablet, Rfl: 0   aspirin EC 81 MG tablet, Take 81 mg by mouth daily., Disp: , Rfl:    atorvastatin (LIPITOR) 40 MG tablet, TAKE 1 TABLET BY MOUTH DAILY, Disp: 90 tablet, Rfl: 2   doxycycline (VIBRAMYCIN) 100 MG capsule, Take 1 capsule (100 mg total) by mouth 2 (two) times daily., Disp: 20 capsule, Rfl: 0   omeprazole (PRILOSEC) 40 MG capsule, TAKE 1 CAPSULE(40 MG) BY MOUTH DAILY, Disp: 90 capsule, Rfl: 0   potassium chloride (KLOR-CON) 10 MEQ tablet, TAKE 1 TABLET(10 MEQ) BY MOUTH TWICE DAILY, Disp: 60 tablet, Rfl: 3   sucralfate (CARAFATE) 1 g tablet, Take 1 tablet (1 g total) by mouth 4 (four) times daily as needed (stomach acid)., Disp: 120 tablet, Rfl: 6   Medications ordered in this encounter:  Meds ordered this encounter  Medications   fluticasone (FLONASE) 50 MCG/ACT nasal spray    Sig: Place 2 sprays into both nostrils daily.    Dispense:  16 g    Refill:  6     *If you need refills on other medications prior to your next appointment, please contact your pharmacy*  Follow-Up: Call back or seek an in-person evaluation if the symptoms worsen or if the condition fails to  improve as anticipated.  Other Instructions I suggest using saline nasal spray.  You can purchase it at most grocery stores or at any pharmacy.  You can use saline nasal spray several times a day to help get your congestion to drain. I also suggest using Mucinex to help with your congestion and I have prescribed Flonase (fluticasone) nasal spray to help with your congestion.   If you have been instructed to have an in-person evaluation today at a local Urgent Care facility, please use the link below. It will take you to a list of all of our available [facility], including address, phone number and hours of operation. Please do not delay care.  [facility]  If you or a family member do not have a primary care provider, use the link below to schedule a visit and establish care. When you choose a [facility] primary care physician or advanced practice provider, you gain a long-term partner in health. Find a Primary Care Provider  Learn more about 's in-office and virtual care options: [facility]

## 2021-05-28 NOTE — Progress Notes (Signed)
Virtual Visit Consent   Christina [name], you are scheduled for a virtual visit with a [facility] provider today.     Just as with appointments in the office, your consent must be obtained to participate.  Your consent will be active for this visit and any virtual visit you may have with one of our providers in the next 365 days.     If you have a MyChart account, a copy of this consent can be sent to you electronically.  All virtual visits are billed to your insurance company just like a traditional visit in the office.    As this is a virtual visit, video technology does not allow for your provider to perform a traditional examination.  This may limit your provider's ability to fully assess your condition.  If your provider identifies any concerns that need to be evaluated in person or the need to arrange testing (such as labs, EKG, etc.), we will make arrangements to do so.     Although advances in technology are sophisticated, we cannot ensure that it will always work on either your end or our end.  If the connection with a video visit is poor, the visit may have to be switched to a telephone visit.  With either a video or telephone visit, we are not always able to ensure that we have a secure connection.     I need to obtain your verbal consent now.   Are you willing to proceed with your visit today?    Christina [name] has provided verbal consent on 05/28/2021 for a virtual visit (video or telephone).   Carvel Getting, NP   Date: 05/28/2021 10:10 AM   Virtual Visit via Video Note   I, Carvel Getting, connected with  Christina [name]  ([id number], 1945/08/15) on 05/28/21 at 10:00 AM EST by a video-enabled telemedicine application and verified that I am speaking with the correct person using two identifiers.  Location: Patient: Virtual Visit Location Patient: Home Provider: Virtual Visit Location Provider: Home Office   I discussed the limitations of evaluation and management by  telemedicine and the availability of in person appointments. The patient expressed understanding and agreed to proceed.    History of Present Illness: Christina [name] is a 74 y.o. who identifies as a female who was assigned female at birth, and is being seen today for nasal congestion.  She has been sick for the last 2 and half weeks.  She was seen in urgent care on 05/15/2021 and prescribed doxycycline for sinus infection.  She feels those nasal congestion she has is still not improved after that.  She reports nasal congestion that is worse at night.  She does have postnasal drainage and a mild cough.  She does not feel short of breath or have any wheezing.  Denies fever or chills.  Earlier in her illness she used albuterol for her symptoms and that helped but she no longer has any chest symptoms and has not used it lately.  She has tried steaming her nasal passages a couple of times without effect.  She denies any purulent nasal drainage or sputum.  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   Angioedema 08/04/2014   GERD (gastroesophageal reflux disease) 08/10/2013   Other and unspecified hyperlipidemia 06/23/2013   Essential hypertension 07/23/2012    Allergies:  Allergies  Allergen Reactions   Coconut Oil Hives   Codeine Other (See Comments)    Upsets stomach really bad   Shrimp [Shellfish Allergy] Hives   Trandolapril  Swelling    Swelling of the face/lips 07/2014   Medications:  Current Outpatient Medications:    fluticasone (FLONASE) 50 MCG/ACT nasal spray, Place 2 sprays into both nostrils daily., Disp: 16 g, Rfl: 6   albuterol (VENTOLIN HFA) 108 (90 Base) MCG/ACT inhaler, INHALE 1 TO 2 PUFFS INTO THE LUNGS EVERY 6 HOURS AS NEEDED FOR WHEEZING OR SHORTNESS OF BREATH, Disp: 6.7 g, Rfl: 1   amLODipine (NORVASC) 10 MG tablet, TAKE 1 TABLET(10 MG) BY MOUTH DAILY, Disp: 90 tablet, Rfl: 0   aspirin EC 81 MG tablet, Take 81 mg by mouth daily., Disp: , Rfl:    atorvastatin (LIPITOR) 40 MG tablet, TAKE 1 TABLET BY MOUTH DAILY, Disp: 90 tablet, Rfl: 2   doxycycline (VIBRAMYCIN) 100 MG capsule, Take 1 capsule (100 mg total) by mouth 2 (two) times daily., Disp: 20 capsule, Rfl: 0   omeprazole (PRILOSEC) 40 MG capsule, TAKE 1 CAPSULE(40 MG) BY MOUTH DAILY, Disp: 90 capsule, Rfl: 0   potassium chloride (KLOR-CON) 10 MEQ tablet, TAKE 1 TABLET(10 MEQ) BY MOUTH TWICE DAILY, Disp: 60 tablet, Rfl: 3   sucralfate (CARAFATE) 1 g tablet, Take 1 tablet (1 g total) by mouth 4 (four) times daily as needed (stomach acid)., Disp: 120 tablet, Rfl: 6  Observations/Objective: Patient is well-developed, well-nourished in no acute distress.  Resting comfortably  at home.  Head is normocephalic, atraumatic.  No labored breathing.  Speech is clear and coherent with logical content.  Patient is alert and oriented at baseline.    Assessment and Plan: 1. Viral upper respiratory tract infection  I believe she has some lingering congestion from an original upper respiratory infection.  She does not seem to have signs and symptoms of a sinus infection at this time.  I have prescribed Flonase.  I recommended nasal saline spray and Mucinex to help manage her symptoms.  I discussed with her reasons for seeking follow-up care.  Follow Up Instructions: I discussed the assessment and treatment plan with the patient. The patient was provided an  opportunity to ask questions and all were answered. The patient agreed with the plan and demonstrated an understanding of the instructions.  A copy of instructions were sent to the patient via MyChart unless otherwise noted below.   The patient was advised to call back or seek an in-person evaluation if the symptoms worsen or if the condition fails to improve as anticipated.  Time:  I spent 10 minutes with the patient via telehealth technology discussing the above problems/concerns.    Carvel Getting, NP

## 2021-06-02 NOTE — Progress Notes (Addendum)
Patient ID: Christina [name], female    DOB: 1946/04/26  MRN: [id number]  CC: Hypertension Follow-Up  Subjective: Christina [name] is a 75 y.o. female who presents for hypertension follow-up.  Her concerns today include:  HYPERTENSION FOLLOW-UP: 03/07/2021: - Continue Amlodipine as prescribed.  06/06/2021: Doing well on current regimen. No side effects. No issues/concerns. Denies chest pain and shortness of breath.   2. URGENT CARE FOLLOW-UP: 05/15/2021 at [facility] at [facility] per NP note: Suggested chest x-ray to patient given duration of cough but patient declined.  Will treat with doxycycline x10 days and prednisone x5 days.  Discussed supportive care and symptom management with patient.  COVID-19 test pending.  Discussed return precautions.  No red flags on exam.  Patient verbalized understanding and was agreeable with plan.  06/06/2021: Reports medications helped some but once completed symptoms seemed to returned. Still having cough that hurts the sides and productive of phlegm without blood. Denies chest pain and shortness of breath. Using natural measures such as teas. Tried Mucinex but made her sick. Declines Covid and flu testing on today.   3. MEDICATION REFILLS: Requesting Omeprazole, Carafate, and Albuterol. Doing well on current regimen.   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[place]) 07/06/2018   Goals of care, counseling/discussion 07/06/2018   Encounter for antineoplastic chemotherapy 07/06/2018   Primary cancer of right upper lobe of lung ([place]) 06/18/2018   Acute URI 05/25/2018   Seasonal allergies 10/06/2017   Thyromegaly 07/01/2017   Colon polyps 04/02/2017   Former smoker 09/27/2016   Smoker 09/27/2016   Cough 04/25/2016   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   Angioedema 08/04/2014    GERD (gastroesophageal reflux disease) 08/10/2013   Other and unspecified hyperlipidemia 06/23/2013   Essential hypertension 07/23/2012     Current Outpatient Medications on File Prior to Visit  Medication Sig Dispense Refill   aspirin EC 81 MG tablet Take 81 mg by mouth daily.     atorvastatin (LIPITOR) 40 MG tablet TAKE 1 TABLET BY MOUTH DAILY 90 tablet 2   doxycycline (VIBRAMYCIN) 100 MG capsule Take 1 capsule (100 mg total) by mouth 2 (two) times daily. 20 capsule 0   fluticasone (FLONASE) 50 MCG/ACT nasal spray Place 2 sprays into both nostrils daily. 16 g 6   potassium chloride (KLOR-CON) 10 MEQ tablet TAKE 1 TABLET(10 MEQ) BY MOUTH TWICE DAILY 60 tablet 3   No current facility-administered medications on file prior to visit.    Allergies  Allergen Reactions   Coconut Oil Hives   Codeine Other (See Comments)    Upsets stomach really bad   Shrimp [Shellfish Allergy] Hives   Trandolapril Swelling    Swelling of the face/lips 07/2014    Social History   Socioeconomic History   Marital status: Married    Spouse name: [name]   Number of children: 1   Years of education: 12th grade   Highest education level: Not on file  Occupational History   Occupation: school housekeeping    Comment: part-time  Tobacco Use   Smoking status: Former    Packs/day: 0.50    Years: 50.00    Pack years: 25.00    Types: Cigarettes   Smokeless tobacco: Never   Tobacco comments:    Previously quit in 2015 but has started smoking again   Vaping Use  Vaping Use: Never used  Substance and Sexual Activity   Alcohol use: No    Alcohol/week: 0.0 standard drinks   Drug use: No   Sexual activity: Not Currently  Other Topics Concern   Not on file  Social History Narrative   Lives with her husband. Her daughter also lives in [place] with her husband and 3 children.   Social Determinants of Health   Financial Resource Strain: Not on file  Food Insecurity: Not on file  Transportation  Needs: Not on file  Physical Activity: Not on file  Stress: Not on file  Social Connections: Not on file  Intimate Partner Violence: Not on file    Family History  Problem Relation Age of Onset   Hypertension Mother    Heart disease Mother    Leukemia Father    Cancer Father    Hypertension Sister    Hypertension Sister    Colon cancer Neg Hx    Colon polyps Neg Hx    Stomach cancer Neg Hx    Esophageal cancer Neg Hx     Past Surgical History:  Procedure Laterality Date   CESAREAN SECTION     1 time   COLONOSCOPY     POLYPECTOMY     VIDEO BRONCHOSCOPY WITH ENDOBRONCHIAL NAVIGATION N/A 06/24/2018   Procedure: VIDEO BRONCHOSCOPY WITH ENDOBRONCHIAL NAVIGATION with biopsies, right upper lung lobe;  Surgeon: Melrose Nakayama, MD;  Location: [facility] OR;  Service: Thoracic;  Laterality: N/A;   VIDEO BRONCHOSCOPY WITH ENDOBRONCHIAL ULTRASOUND N/A 06/24/2018   Procedure: VIDEO BRONCHOSCOPY WITH ENDOBRONCHIAL ULTRASOUND, right upper lung lobe;  Surgeon: Melrose Nakayama, MD;  Location: [facility] OR;  Service: Thoracic;  Laterality: N/A;    ROS: Review of Systems Negative except as stated above  PHYSICAL EXAM: BP 123/82    Pulse 95    Temp 97.9 F (36.6 C) (Oral)    Resp 20    Wt 175 lb (79.4 kg)    SpO2 94%    BMI 26.62 kg/m   Physical Exam HENT:     Head: Normocephalic and atraumatic.  Eyes:     Extraocular Movements: Extraocular movements intact.     Conjunctiva/sclera: Conjunctivae normal.     Pupils: Pupils are equal, round, and reactive to light.  Cardiovascular:     Rate and Rhythm: Normal rate and regular rhythm.     Pulses: Normal pulses.     Heart sounds: Normal heart sounds.  Pulmonary:     Effort: Pulmonary effort is normal.     Breath sounds: Normal breath sounds.  Musculoskeletal:     Cervical back: Normal range of motion and neck supple.  Neurological:     General: No focal deficit present.     Mental Status: She is alert and oriented to person, place, and  time.  Psychiatric:        Mood and Affect: Mood normal.        Behavior: Behavior normal.   ASSESSMENT AND PLAN: 1. Essential hypertension: - Continue Amlodipine as prescribed. - Counseled on blood pressure goal of less than 140/90, low-sodium, DASH diet, medication compliance, 150 minutes of moderate intensity exercise per week as tolerated. Discussed medication compliance, adverse effects. - Follow-up with primary provider in 3 months or sooner if needed.  - amLODipine (NORVASC) 10 MG tablet; Take 1 tablet (10 mg total) by mouth daily.  Dispense: 90 tablet; Refill: 0  2. Gastroesophageal reflux disease without esophagitis: - Continue Omeprazole and Sucralfate as prescribed.  - Follow-up  with primary provider as scheduled.  - omeprazole (PRILOSEC) 40 MG capsule; TAKE 1 CAPSULE(40 MG) BY MOUTH DAILY  Dispense: 90 capsule; Refill: 0 - sucralfate (CARAFATE) 1 g tablet; Take 1 tablet (1 g total) by mouth 4 (four) times daily as needed (stomach acid).  Dispense: 120 tablet; Refill: 3  3. Wheezing: 4. Perennial allergic rhinitis: - Continue Albuterol inhaler as prescribed.  - Follow-up with primary provider as scheduled. - albuterol (VENTOLIN HFA) 108 (90 Base) MCG/ACT inhaler; INHALE 1 TO 2 PUFFS INTO THE LUNGS EVERY 6 HOURS AS NEEDED FOR WHEEZING OR SHORTNESS OF BREATH  Dispense: 6.7 g; Refill: 1  5. Chronic cough: - Patient today in office without cardiopulmonary distress.  - Patient declined Covid and flu testing.  - Prednisone as prescribed.  - Diagnostic chest x-ray for further evaluation. - Follow-up with primary provider as scheduled. - DG Chest 2 View; Future - predniSONE (DELTASONE) 10 MG tablet; Take 6 tablets (60 mg total) by mouth daily with breakfast for 1 day, THEN 5 tablets (50 mg total) daily with breakfast for 1 day, THEN 4 tablets (40 mg total) daily with breakfast for 1 day, THEN 3 tablets (30 mg total) daily with breakfast for 1 day, THEN 2 tablets (20 mg total) daily  with breakfast for 1 day, THEN 1 tablet (10 mg total) daily with breakfast for 1 day.  Dispense: 21 tablet; Refill: 0    Patient was given the opportunity to ask questions.  Patient verbalized understanding of the plan and was able to repeat key elements of the plan. Patient was given clear instructions to go to Emergency Department or return to medical center if symptoms don't improve, worsen, or new problems develop.The patient verbalized understanding.   Orders Placed This Encounter  Procedures   DG Chest 2 View     Requested Prescriptions   Signed Prescriptions Disp Refills   amLODipine (NORVASC) 10 MG tablet 90 tablet 0    Sig: Take 1 tablet (10 mg total) by mouth daily.   omeprazole (PRILOSEC) 40 MG capsule 90 capsule 0    Sig: TAKE 1 CAPSULE(40 MG) BY MOUTH DAILY   albuterol (VENTOLIN HFA) 108 (90 Base) MCG/ACT inhaler 6.7 g 1    Sig: INHALE 1 TO 2 PUFFS INTO THE LUNGS EVERY 6 HOURS AS NEEDED FOR WHEEZING OR SHORTNESS OF BREATH   sucralfate (CARAFATE) 1 g tablet 120 tablet 3    Sig: Take 1 tablet (1 g total) by mouth 4 (four) times daily as needed (stomach acid).   predniSONE (DELTASONE) 10 MG tablet 21 tablet 0    Sig: Take 6 tablets (60 mg total) by mouth daily with breakfast for 1 day, THEN 5 tablets (50 mg total) daily with breakfast for 1 day, THEN 4 tablets (40 mg total) daily with breakfast for 1 day, THEN 3 tablets (30 mg total) daily with breakfast for 1 day, THEN 2 tablets (20 mg total) daily with breakfast for 1 day, THEN 1 tablet (10 mg total) daily with breakfast for 1 day.    Return in about 3 months (around 09/04/2021) for Follow-Up or next available hypertension .  [name], NP

## 2021-06-06 ENCOUNTER — Encounter: Payer: Self-pay | Admitting: Family

## 2021-06-06 ENCOUNTER — Ambulatory Visit: Payer: BC Managed Care – PPO | Admitting: Family

## 2021-06-06 ENCOUNTER — Other Ambulatory Visit: Payer: Self-pay

## 2021-06-06 VITALS — BP 123/82 | HR 95 | Temp 97.9°F | Resp 20 | Wt 175.0 lb

## 2021-06-06 DIAGNOSIS — R062 Wheezing: Secondary | ICD-10-CM

## 2021-06-06 DIAGNOSIS — K219 Gastro-esophageal reflux disease without esophagitis: Secondary | ICD-10-CM | POA: Diagnosis not present

## 2021-06-06 DIAGNOSIS — J3089 Other allergic rhinitis: Secondary | ICD-10-CM

## 2021-06-06 DIAGNOSIS — I10 Essential (primary) hypertension: Secondary | ICD-10-CM | POA: Diagnosis not present

## 2021-06-06 DIAGNOSIS — R053 Chronic cough: Secondary | ICD-10-CM

## 2021-06-06 MED ORDER — SUCRALFATE 1 G PO TABS: 1.0000 g | ORAL_TABLET | Freq: Four times a day (QID) | ORAL | 3 refills | Status: DC | PRN

## 2021-06-06 MED ORDER — ALBUTEROL SULFATE HFA 108 (90 BASE) MCG/ACT IN AERS: INHALATION_SPRAY | RESPIRATORY_TRACT | 1 refills | Status: DC

## 2021-06-06 MED ORDER — OMEPRAZOLE 40 MG PO CPDR: DELAYED_RELEASE_CAPSULE | ORAL | 0 refills | Status: DC

## 2021-06-06 MED ORDER — AMLODIPINE BESYLATE 10 MG PO TABS: 10.0000 mg | ORAL_TABLET | Freq: Every day | ORAL | 0 refills | Status: DC

## 2021-06-06 MED ORDER — PREDNISONE 10 MG PO TABS: ORAL_TABLET | ORAL | 0 refills | Status: AC

## 2021-06-06 NOTE — Progress Notes (Signed)
HTN Cough x 1 month

## 2021-06-12 ENCOUNTER — Telehealth: Payer: Self-pay | Admitting: *Deleted

## 2021-06-12 NOTE — Telephone Encounter (Signed)
Patient called and informed of times to come to office for XR. Advised that office will be open Friday morning but will close all day Monday.  Patient verbalized understanding.

## 2021-07-09 NOTE — Progress Notes (Signed)
Established Patient Office Visit  Subjective:  Patient ID: Christina [name], female    DOB: 01/15/1946  Age: 76 y.o. MRN: [id number]  CC:  Chief Complaint  Patient presents with   Sinus Problem    Patient states she has been experiencing some head congestion for about 3 weeks. Per patient she has been taking some mucinex and other otc medications but no relief.    HPI Christina [name] presents for sinus congestion  Onset 3 weeks ago. Has been using OTC meds without relief Sinus pressure, pnd, mild cough No shob, doe, chest pain, fevers, nvd Has had bacterial sinusitis in the past, very similar in presentation.  Otherwise no acute concerns.   Past Medical History:  Diagnosis Date   Allergy    GERD (gastroesophageal reflux disease)    Hyperlipidemia    Hypertension    nscl ca dx'd 05/2018   lung cancer   Reflux    Tubulovillous adenoma of colon 2003    Past Surgical History:  Procedure Laterality Date   CESAREAN SECTION     1 time   COLONOSCOPY     POLYPECTOMY     VIDEO BRONCHOSCOPY WITH ENDOBRONCHIAL NAVIGATION N/A 06/24/2018   Procedure: VIDEO BRONCHOSCOPY WITH ENDOBRONCHIAL NAVIGATION with biopsies, right upper lung lobe;  Surgeon: Melrose Nakayama, MD;  Location: [facility] OR;  Service: Thoracic;  Laterality: N/A;   VIDEO BRONCHOSCOPY WITH ENDOBRONCHIAL ULTRASOUND N/A 06/24/2018   Procedure: VIDEO BRONCHOSCOPY WITH ENDOBRONCHIAL ULTRASOUND, right upper lung lobe;  Surgeon: Melrose Nakayama, MD;  Location: [facility] OR;  Service: Thoracic;  Laterality: N/A;    Family History  Problem Relation Age of Onset   Hypertension Mother    Heart disease Mother    Leukemia Father    Cancer Father    Hypertension Sister    Hypertension Sister    Colon cancer Neg Hx    Colon polyps Neg Hx    Stomach cancer Neg Hx    Esophageal cancer Neg Hx     Social History   Socioeconomic History   Marital status: Married    Spouse name: [name]   Number of children: 1   Years of  education: 12th grade   Highest education level: Not on file  Occupational History   Occupation: school housekeeping    Comment: part-time  Tobacco Use   Smoking status: Former    Packs/day: 0.50    Years: 50.00    Pack years: 25.00    Types: Cigarettes   Smokeless tobacco: Never   Tobacco comments:    Previously quit in 2015 but has started smoking again   Vaping Use   Vaping Use: Never used  Substance and Sexual Activity   Alcohol use: No    Alcohol/week: 0.0 standard drinks   Drug use: No   Sexual activity: Not Currently  Other Topics Concern   Not on file  Social History Narrative   Lives with her husband. Her daughter also lives in [place] with her husband and 3 children.   Social Determinants of Health   Financial Resource Strain: Not on file  Food Insecurity: Not on file  Transportation Needs: Not on file  Physical Activity: Not on file  Stress: Not on file  Social Connections: Not on file  Intimate Partner Violence: Not on file    Outpatient Medications Prior to Visit  Medication Sig Dispense Refill   aspirin EC 81 MG tablet Take 81 mg by mouth daily.     albuterol (VENTOLIN  HFA) 108 (90 Base) MCG/ACT inhaler INHALE 2 PUFFS INTO THE LUNGS EVERY 6 HOURS AS NEEDED FOR WHEEZING OR SHORTNESS OF BREATH 6.7 g 1   amLODipine (NORVASC) 10 MG tablet Take 1 tablet (10 mg total) by mouth daily. 90 tablet 1   atorvastatin (LIPITOR) 40 MG tablet TAKE 1 TABLET BY MOUTH DAILY 90 tablet 2   omeprazole (PRILOSEC) 40 MG capsule TAKE 1 CAPSULE(40 MG) BY MOUTH DAILY 90 capsule 2   potassium chloride (KLOR-CON) 10 MEQ tablet TAKE 1 TABLET(10 MEQ) BY MOUTH TWICE DAILY 60 tablet 6   sucralfate (CARAFATE) 1 g tablet Take 1 tablet (1 g total) by mouth 4 (four) times daily as needed (stomach acid). 120 tablet 6   HYDROcodone-acetaminophen (HYCET) 7.5-325 mg/15 ml solution Take 15 mLs by mouth 4 (four) times daily as needed. (Patient not taking: Reported on 07/25/2020) 473 mL 0   No  facility-administered medications prior to visit.    Allergies  Allergen Reactions   Coconut Oil Hives   Codeine Other (See Comments)    Upsets stomach really bad   Shrimp [Shellfish Allergy] Hives   Trandolapril Swelling    Swelling of the face/lips 07/2014    ROS Review of Systems Per hpi     Objective:    Physical Exam Vitals and nursing note reviewed.  Constitutional:      General: She is not in acute distress.    Appearance: Normal appearance. She is normal weight. She is not ill-appearing, toxic-appearing or diaphoretic.  HENT:     Right Ear: Tympanic membrane, ear canal and external ear normal. There is no impacted cerumen.     Left Ear: Tympanic membrane, ear canal and external ear normal. There is no impacted cerumen.     Nose: Congestion present.     Mouth/Throat:     Pharynx: Oropharynx is clear. No oropharyngeal exudate or posterior oropharyngeal erythema.  Cardiovascular:     Rate and Rhythm: Normal rate and regular rhythm.     Heart sounds: Normal heart sounds. No murmur heard.   No friction rub. No gallop.  Pulmonary:     Effort: Pulmonary effort is normal. No respiratory distress.     Breath sounds: Normal breath sounds. No stridor. No wheezing, rhonchi or rales.  Chest:     Chest wall: No tenderness.  Skin:    General: Skin is warm and dry.  Neurological:     General: No focal deficit present.     Mental Status: She is alert and oriented to person, place, and time. Mental status is at baseline.  Psychiatric:        Mood and Affect: Mood normal.        Behavior: Behavior normal.        Thought Content: Thought content normal.        Judgment: Judgment normal.    There were no vitals taken for this visit. Wt Readings from Last 3 Encounters:  06/06/21 175 lb (79.4 kg)  04/02/21 183 lb 3.2 oz (83.1 kg)  03/07/21 176 lb 12.8 oz (80.2 kg)     Health Maintenance Due  Topic Date Due   Zoster Vaccines- Shingrix (1 of 2) Never done   MAMMOGRAM   07/11/2018   COVID-19 Vaccine (4 - Booster for Pfizer series) 06/20/2020    There are no preventive care reminders to display for this patient.  Lab Results  Component Value Date   TSH 1.290 03/07/2021   Lab Results  Component Value Date  WBC 5.4 03/26/2021   HGB 13.4 03/26/2021   HCT 41.6 03/26/2021   MCV 90.0 03/26/2021   PLT 309 03/26/2021   Lab Results  Component Value Date   NA 140 03/26/2021   K 3.8 03/26/2021   CO2 26 03/26/2021   GLUCOSE 85 03/26/2021   BUN 16 03/26/2021   CREATININE 0.97 03/26/2021   BILITOT 0.4 03/26/2021   ALKPHOS 93 03/26/2021   AST 15 03/26/2021   ALT 13 03/26/2021   PROT 7.3 03/26/2021   ALBUMIN 3.8 03/26/2021   CALCIUM 9.4 03/26/2021   ANIONGAP 8 03/26/2021   EGFR 66 03/07/2021   Lab Results  Component Value Date   CHOL 178 03/07/2021   Lab Results  Component Value Date   HDL 48 03/07/2021   Lab Results  Component Value Date   LDLCALC 109 (H) 03/07/2021   Lab Results  Component Value Date   TRIG 114 03/07/2021   Lab Results  Component Value Date   CHOLHDL 3.7 03/07/2021   Lab Results  Component Value Date   HGBA1C 5.8 (A) 03/07/2021      Assessment & Plan:   Problem List Items Addressed This Visit   None Visit Diagnoses     Acute non-recurrent frontal sinusitis    -  Primary       Meds ordered this encounter  Medications   DISCONTD: amoxicillin-clavulanate (AUGMENTIN) 875-125 MG tablet    Sig: Take 1 tablet by mouth 2 (two) times daily.    Dispense:  14 tablet    Refill:  0    Order Specific Question:   Supervising Provider    Answer:   Carlota Raspberry, JEFFREY R [[id number]]   DISCONTD: azelastine (ASTELIN) 0.1 % nasal spray    Sig: Place 1 spray into both nostrils 2 (two) times daily. Use in each nostril as directed    Dispense:  30 mL    Refill:  12    Order Specific Question:   Supervising Provider    Answer:   Carlota Raspberry, JEFFREY R [[id number]]    Follow-up: No follow-ups on file.   PLAN Given duration of  symptoms, will give augmentin for bacterial infection Azelastine for symptom relief. Can continue mucinex Return if worsening or failing to improve Patient encouraged to call clinic with any questions, comments, or concerns.  Maximiano Coss, NP

## 2021-07-31 ENCOUNTER — Other Ambulatory Visit: Payer: Self-pay | Admitting: Family Medicine

## 2021-07-31 DIAGNOSIS — I10 Essential (primary) hypertension: Secondary | ICD-10-CM

## 2021-08-01 ENCOUNTER — Other Ambulatory Visit: Payer: Self-pay

## 2021-08-01 DIAGNOSIS — I10 Essential (primary) hypertension: Secondary | ICD-10-CM

## 2021-08-01 MED ORDER — POTASSIUM CHLORIDE CRYS ER 10 MEQ PO TBCR: EXTENDED_RELEASE_TABLET | ORAL | 0 refills | Status: DC

## 2021-08-03 ENCOUNTER — Encounter: Payer: Self-pay | Admitting: Internal Medicine

## 2021-08-14 ENCOUNTER — Other Ambulatory Visit: Payer: Self-pay | Admitting: Family

## 2021-08-14 DIAGNOSIS — J3089 Other allergic rhinitis: Secondary | ICD-10-CM

## 2021-08-14 DIAGNOSIS — R062 Wheezing: Secondary | ICD-10-CM

## 2021-08-15 ENCOUNTER — Ambulatory Visit: Payer: BC Managed Care – PPO

## 2021-08-15 ENCOUNTER — Other Ambulatory Visit: Payer: Self-pay

## 2021-08-15 ENCOUNTER — Ambulatory Visit
Admission: EM | Admit: 2021-08-15 | Discharge: 2021-08-15 | Disposition: A | Discharge status: Home / Self Care | Payer: BC Managed Care – PPO | Attending: Internal Medicine | Admitting: Internal Medicine

## 2021-08-15 DIAGNOSIS — R051 Acute cough: Secondary | ICD-10-CM

## 2021-08-15 DIAGNOSIS — J069 Acute upper respiratory infection, unspecified: Secondary | ICD-10-CM

## 2021-08-15 MED ORDER — BENZONATATE 100 MG PO CAPS: 100.0000 mg | ORAL_CAPSULE | Freq: Three times a day (TID) | ORAL | 0 refills | Status: DC | PRN

## 2021-08-15 MED ORDER — AMOXICILLIN 500 MG PO CAPS: 1000.0000 mg | ORAL_CAPSULE | Freq: Three times a day (TID) | ORAL | 0 refills | Status: AC

## 2021-08-15 NOTE — ED Provider Notes (Signed)
[facility]    CSN: [id number] Arrival date & time: 08/15/21  0950      History   Chief Complaint Chief Complaint  Patient presents with   Nasal Congestion    HPI Christina [name] is a 76 y.o. female.   Patient presents with 4-week history of nasal congestion and nonproductive cough.  Denies any known fevers or sick contacts.  Denies chest pain, shortness of breath, nausea, vomiting, diarrhea, abdominal pain.  Patient has taken over-the-counter Mucinex with minimal improvement.  Denies history of asthma or COPD.    Past Medical History:  Diagnosis Date   Allergy    GERD (gastroesophageal reflux disease)    Hyperlipidemia    Hypertension    nscl ca dx'd 05/2018   lung cancer   Reflux    Tubulovillous adenoma of colon 2003    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[employer]) 07/06/2018   Goals of care, counseling/discussion 07/06/2018   Encounter for antineoplastic chemotherapy 07/06/2018   Primary cancer of right upper lobe of lung ([place]) 06/18/2018   Acute URI 05/25/2018   Seasonal allergies 10/06/2017   Thyromegaly 07/01/2017   Colon polyps 04/02/2017   Former smoker 09/27/2016   Smoker 09/27/2016   Cough 04/25/2016   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   Angioedema 08/04/2014   GERD (gastroesophageal reflux disease) 08/10/2013   Other and unspecified hyperlipidemia 06/23/2013   Essential hypertension 07/23/2012    Past Surgical History:  Procedure Laterality Date   CESAREAN SECTION     1 time   COLONOSCOPY     POLYPECTOMY     VIDEO BRONCHOSCOPY WITH ENDOBRONCHIAL NAVIGATION N/A 06/24/2018   Procedure: VIDEO BRONCHOSCOPY WITH ENDOBRONCHIAL NAVIGATION with biopsies, right upper lung lobe;  Surgeon: Melrose Nakayama, MD;  Location: [facility] OR;  Service: Thoracic;  Laterality: N/A;   VIDEO  BRONCHOSCOPY WITH ENDOBRONCHIAL ULTRASOUND N/A 06/24/2018   Procedure: VIDEO BRONCHOSCOPY WITH ENDOBRONCHIAL ULTRASOUND, right upper lung lobe;  Surgeon: Melrose Nakayama, MD;  Location: [facility] OR;  Service: Thoracic;  Laterality: N/A;    OB History   No obstetric history on file.      Home Medications    Prior to Admission medications   Medication Sig Start Date End Date Taking? Authorizing Provider  amoxicillin (AMOXIL) 500 MG capsule Take 2 capsules (1,000 mg total) by mouth 3 (three) times daily for 5 days. 08/15/21 08/20/21 Yes Bryse Blanchette, Michele Rockers, FNP  benzonatate (TESSALON) 100 MG capsule Take 1 capsule (100 mg total) by mouth every 8 (eight) hours as needed for cough. 08/15/21  Yes Madylin Fairbank, Hildred Alamin E, FNP  albuterol (VENTOLIN HFA) 108 (90 Base) MCG/ACT inhaler INHALE 1 TO 2 PUFFS INTO THE LUNGS EVERY 6 HOURS AS NEEDED FOR WHEEZING OR SHORTNESS OF BREATH 06/06/21   [name], NP  amLODipine (NORVASC) 10 MG tablet Take 1 tablet (10 mg total) by mouth daily. 06/06/21 09/04/21  [name], NP  aspirin EC 81 MG tablet Take 81 mg by mouth daily.    [provider]  atorvastatin (LIPITOR) 40 MG tablet TAKE 1 TABLET BY MOUTH DAILY 03/07/21   [name], NP  fluticasone (FLONASE) 50 MCG/ACT nasal spray Place 2 sprays into both nostrils daily. 05/28/21   Carvel Getting, NP  omeprazole (PRILOSEC) 40 MG capsule TAKE 1 CAPSULE(40 MG) BY MOUTH DAILY 06/06/21   Minette Brine,  Amy J, NP  potassium chloride (KLOR-CON M) 10 MEQ tablet TAKE 1 TABLET(10 MEQ) BY MOUTH TWICE DAILY 08/01/21   [name], NP  sucralfate (CARAFATE) 1 g tablet Take 1 tablet (1 g total) by mouth 4 (four) times daily as needed (stomach acid). 06/06/21   [name], NP    Family History Family History  Problem Relation Age of Onset   Hypertension Mother    Heart disease Mother    Leukemia Father    Cancer Father    Hypertension Sister    Hypertension Sister    Colon cancer Neg Hx    Colon polyps Neg Hx     Stomach cancer Neg Hx    Esophageal cancer Neg Hx     Social History Social History   Tobacco Use   Smoking status: Former    Packs/day: 0.50    Years: 50.00    Pack years: 25.00    Types: Cigarettes   Smokeless tobacco: Never   Tobacco comments:    Previously quit in 2015 but has started smoking again   Vaping Use   Vaping Use: Never used  Substance Use Topics   Alcohol use: No    Alcohol/week: 0.0 standard drinks   Drug use: No     Allergies   Coconut oil, Codeine, Shrimp [shellfish allergy], and Trandolapril   Review of Systems Review of Systems Per HPI  Physical Exam Triage Vital Signs ED Triage Vitals  Enc Vitals Group     BP 08/15/21 1016 (!) 143/93     Pulse Rate 08/15/21 1016 84     Resp 08/15/21 1016 16     Temp 08/15/21 1016 98.7 F (37.1 C)     Temp Source 08/15/21 1016 Oral     SpO2 08/15/21 1016 95 %     Weight --      Height --      Head Circumference --      Peak Flow --      Pain Score 08/15/21 1018 0     Pain Loc --      Pain Edu? --      Excl. in [place]? --    No data found.  Updated Vital Signs BP (!) 143/93 (BP Location: Right Arm)    Pulse 84    Temp 98.7 F (37.1 C) (Oral)    Resp 16    SpO2 96%   Visual Acuity Right Eye Distance:   Left Eye Distance:   Bilateral Distance:    Right Eye Near:   Left Eye Near:    Bilateral Near:     Physical Exam Constitutional:      General: She is not in acute distress.    Appearance: Normal appearance. She is not toxic-appearing or diaphoretic.  HENT:     Head: Normocephalic and atraumatic.     Right Ear: Tympanic membrane and ear canal normal.     Left Ear: Tympanic membrane and ear canal normal.     Nose: Congestion present.     Mouth/Throat:     Mouth: Mucous membranes are moist.     Pharynx: No posterior oropharyngeal erythema.  Eyes:     Extraocular Movements: Extraocular movements intact.     Conjunctiva/sclera: Conjunctivae normal.     Pupils: Pupils are equal, round, and  reactive to light.  Cardiovascular:     Rate and Rhythm: Normal rate and regular rhythm.     Pulses: Normal pulses.     Heart sounds:  Normal heart sounds.  Pulmonary:     Effort: Pulmonary effort is normal. No respiratory distress.     Breath sounds: Normal breath sounds. No stridor. No wheezing, rhonchi or rales.  Abdominal:     General: Abdomen is flat. Bowel sounds are normal.     Palpations: Abdomen is soft.  Musculoskeletal:        General: Normal range of motion.     Cervical back: Normal range of motion.  Skin:    General: Skin is warm and dry.  Neurological:     General: No focal deficit present.     Mental Status: She is alert and oriented to person, place, and time. Mental status is at baseline.  Psychiatric:        Mood and Affect: Mood normal.        Behavior: Behavior normal.     UC Treatments / Results  Labs (all labs ordered are listed, but only abnormal results are displayed) Labs Reviewed - No data to display  EKG   Radiology No results found.  Procedures Procedures (including critical care time)  Medications Ordered in UC Medications - No data to display  Initial Impression / Assessment and Plan / UC Course  I have reviewed the triage vital signs and the nursing notes.  Pertinent labs & imaging results that were available during my care of the patient were reviewed by me and considered in my medical decision making (see chart for details).     Suggested chest x-ray but x-ray machine is currently not working in urgent care today.  Offered patient outpatient imaging but patient declined.  Will prescribe amoxicillin due to duration of symptoms.  Benzonatate prescribed to take as needed for cough.  Discussed return precautions.  Patient verbalized understanding and was agreeable plan. Final Clinical Impressions(s) / UC Diagnoses   Final diagnoses:  Acute upper respiratory infection  Acute cough     Discharge Instructions      You are being  treated with an antibiotic for upper respiratory infection.  Cough medication has also been prescribed for you.  Please follow-up if symptoms persist or worsen.    ED Prescriptions     Medication Sig Dispense Auth. Provider   amoxicillin (AMOXIL) 500 MG capsule Take 2 capsules (1,000 mg total) by mouth 3 (three) times daily for 5 days. 30 capsule [place], [place] E, [place]   benzonatate (TESSALON) 100 MG capsule Take 1 capsule (100 mg total) by mouth every 8 (eight) hours as needed for cough. 21 capsule [place], Michele Rockers, [place]      PDMP not reviewed this encounter.   Teodora Medici,  08/15/21 1108

## 2021-08-15 NOTE — ED Triage Notes (Signed)
Pt presents with c/o nasal congestion x 4 weeks. Pt taking OTC mucinex.  ?

## 2021-08-15 NOTE — Discharge Instructions (Signed)
You are being treated with an antibiotic for upper respiratory infection.  Cough medication has also been prescribed for you.  Please follow-up if symptoms persist or worsen. ?

## 2021-08-31 NOTE — Progress Notes (Signed)
? ? ?Patient ID: Christina [name], female    DOB: 26-Jul-1945  MRN: [id number] ? ?CC: Hypertension Follow-Up ? ?Subjective: ?Christina [name] is a 76 y.o. female who presents for hypertension follow-up.  ? ?Her concerns today include:  ?HYPERTENSION FOLLOW-UP: ?06/06/2021: ?- Continue Amlodipine as prescribed. ? ?09/04/2021: ?Doing well on current regimen. No side effects. No issues/concerns. Denies chest pain and shortness of breath. Home blood pressure similar to today's reading. ? ?2. PREDIABETES FOLLOW-UP: ?Repeat screening.  ? ?3. ACID REFLUX FOLLOW-UP: ?Refills Omeprazole and Carafate. ? ?Patient Active Problem List  ? Diagnosis Date Noted  ? Prediabetes 03/07/2021  ? Encounter for antineoplastic immunotherapy 10/06/2018  ? Stage III squamous cell carcinoma of right lung ([place]) 07/06/2018  ? Goals of care, counseling/discussion 07/06/2018  ? Encounter for antineoplastic chemotherapy 07/06/2018  ? Primary cancer of right upper lobe of lung ([place]) 06/18/2018  ? Acute URI 05/25/2018  ? Seasonal allergies 10/06/2017  ? Thyromegaly 07/01/2017  ? Colon polyps 04/02/2017  ? Former smoker 09/27/2016  ? Smoker 09/27/2016  ? Cough 04/25/2016  ? 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  ? Angioedema 08/04/2014  ? GERD (gastroesophageal reflux disease) 08/10/2013  ? Other and unspecified hyperlipidemia 06/23/2013  ? Essential hypertension 07/23/2012  ?  ? ?Current Outpatient Medications on File Prior to Visit  ?Medication Sig Dispense Refill  ? albuterol (VENTOLIN HFA) 108 (90 Base) MCG/ACT inhaler INHALE 1 TO 2 PUFFS INTO THE LUNGS EVERY 6 HOURS AS NEEDED FOR WHEEZING OR SHORTNESS OF BREATH 20.1 g 1  ? aspirin EC 81 MG tablet Take 81 mg by mouth daily.    ? atorvastatin (LIPITOR) 40 MG tablet TAKE 1 TABLET BY MOUTH DAILY 90 tablet 2  ? fluticasone (FLONASE) 50 MCG/ACT nasal spray Place 2 sprays into both nostrils daily. 16 g 6  ? potassium chloride (KLOR-CON M) 10 MEQ tablet TAKE 1 TABLET(10 MEQ) BY MOUTH TWICE DAILY 90  tablet 0  ? ?No current facility-administered medications on file prior to visit.  ? ? ?Allergies  ?Allergen Reactions  ? Coconut Oil Hives  ? Codeine Other (See Comments)  ?  Upsets stomach really bad  ? Shrimp [Shellfish Allergy] Hives  ? Trandolapril Swelling  ?  Swelling of the face/lips 07/2014  ? ? ?Social History  ? ?Socioeconomic History  ? Marital status: Married  ?  Spouse name: [name]  ? Number of children: 1  ? Years of education: 12th grade  ? Highest education level: Not on file  ?Occupational History  ? Occupation: school housekeeping  ?  Comment: part-time  ?Tobacco Use  ? Smoking status: Former  ?  Packs/day: 0.50  ?  Years: 50.00  ?  Pack years: 25.00  ?  Types: Cigarettes  ? Smokeless tobacco: Never  ? Tobacco comments:  ?  Previously quit in 2015 but has started smoking again   ?Vaping Use  ? Vaping Use: Never used  ?Substance and Sexual Activity  ? Alcohol use: No  ?  Alcohol/week: 0.0 standard drinks  ? Drug use: No  ? Sexual activity: Not Currently  ?Other Topics Concern  ? Not on file  ?Social History Narrative  ? Lives with her husband. Her daughter also lives in [place] with her husband and 3 children.  ? ?Social Determinants of Health  ? ?Financial Resource Strain: Not on file  ?Food Insecurity: Not on file  ?Transportation Needs: Not on file  ?Physical Activity: Not on file  ?Stress: Not on file  ?Social Connections: Not on file  ?  Intimate Partner Violence: Not on file  ? ? ?Family History  ?Problem Relation Age of Onset  ? Hypertension Mother   ? Heart disease Mother   ? Leukemia Father   ? Cancer Father   ? Hypertension Sister   ? Hypertension Sister   ? Colon cancer Neg Hx   ? Colon polyps Neg Hx   ? Stomach cancer Neg Hx   ? Esophageal cancer Neg Hx   ? ? ?Past Surgical History:  ?Procedure Laterality Date  ? CESAREAN SECTION    ? 1 time  ? COLONOSCOPY    ? POLYPECTOMY    ? VIDEO BRONCHOSCOPY WITH ENDOBRONCHIAL NAVIGATION N/A 06/24/2018  ? Procedure: VIDEO BRONCHOSCOPY WITH  ENDOBRONCHIAL NAVIGATION with biopsies, right upper lung lobe;  Surgeon: Melrose Nakayama, MD;  Location: [facility] OR;  Service: Thoracic;  Laterality: N/A;  ? VIDEO BRONCHOSCOPY WITH ENDOBRONCHIAL ULTRASOUND N/A 06/24/2018  ? Procedure: VIDEO BRONCHOSCOPY WITH ENDOBRONCHIAL ULTRASOUND, right upper lung lobe;  Surgeon: Melrose Nakayama, MD;  Location: [facility] OR;  Service: Thoracic;  Laterality: N/A;  ? ? ?ROS: ?Review of Systems ?Negative except as stated above ? ?PHYSICAL EXAM: ?BP 124/83 (BP Location: Left Arm, Patient Position: Sitting, Cuff Size: Normal)   Pulse 92   Temp 98.3 ?F (36.8 ?C)   Resp 18   Ht 5' 7.99" (1.727 m)   Wt 173 lb (78.5 kg)   SpO2 97%   BMI 26.31 kg/m?  ? ?Physical Exam ?HENT:  ?   Head: Normocephalic.  ?Eyes:  ?   Extraocular Movements: Extraocular movements intact.  ?   Conjunctiva/sclera: Conjunctivae normal.  ?   Pupils: Pupils are equal, round, and reactive to light.  ?Cardiovascular:  ?   Rate and Rhythm: Normal rate and regular rhythm.  ?   Pulses: Normal pulses.  ?   Heart sounds: Normal heart sounds.  ?Pulmonary:  ?   Effort: Pulmonary effort is normal.  ?   Breath sounds: Normal breath sounds.  ?Musculoskeletal:  ?   Cervical back: Normal range of motion and neck supple.  ?Neurological:  ?   General: No focal deficit present.  ?   Mental Status: She is alert and oriented to person, place, and time.  ?Psychiatric:     ?   Mood and Affect: Mood normal.     ?   Behavior: Behavior normal.  ? ?Results for orders placed or performed in visit on 09/04/21  ?POCT glycosylated hemoglobin (Hb A1C)  ?Result Value Ref Range  ? Hemoglobin A1C 5.8 (A) 4.0 - 5.6 %  ? HbA1c POC (<> result, manual entry)    ? HbA1c, POC (prediabetic range)    ? HbA1c, POC (controlled diabetic range)    ? ? ?ASSESSMENT AND PLAN: ?1. Essential (primary) hypertension: ?- Continue Amlodipine as prescribed.  ?- Counseled on blood pressure goal of less than 140/90, low-sodium, DASH diet, medication compliance, 150  minutes of moderate intensity exercise per week as tolerated. Discussed medication compliance, adverse effects. ?- Update BMP.  ?- Follow-up with primary provider in 3 months or sooner if needed.  ?- Basic Metabolic Panel ?- amLODipine (NORVASC) 10 MG tablet; Take 1 tablet (10 mg total) by mouth daily.  Dispense: 90 tablet; Refill: 0 ? ?2. Prediabetes: ?- Hemoglobin A1c today remaining consistent with prediabetes at 5.8%. ?- Follow-up with primary provider in 6 months or sooner if needed.  ?- POCT glycosylated hemoglobin (Hb A1C) ? ?3. Gastroesophageal reflux disease without esophagitis: ?- Continue Omeprazole and Sucralfate as  prescribed.  ?- Follow-up with primary provider as scheduled.  ?- omeprazole (PRILOSEC) 40 MG capsule; TAKE 1 CAPSULE(40 MG) BY MOUTH DAILY  Dispense: 90 capsule; Refill: 0 ?- sucralfate (CARAFATE) 1 g tablet; Take 1 tablet (1 g total) by mouth 4 (four) times daily as needed (stomach acid).  Dispense: 120 tablet; Refill: 3 ? ? ? ?Patient was given the opportunity to ask questions.  Patient verbalized understanding of the plan and was able to repeat key elements of the plan. Patient was given clear instructions to go to Emergency Department or return to medical center if symptoms don't improve, worsen, or new problems develop.The patient verbalized understanding. ? ? ?Orders Placed This Encounter  ?Procedures  ? Basic Metabolic Panel  ? POCT glycosylated hemoglobin (Hb A1C)  ? ? ? ?Requested Prescriptions  ? ?Signed Prescriptions Disp Refills  ? amLODipine (NORVASC) 10 MG tablet 90 tablet 0  ?  Sig: Take 1 tablet (10 mg total) by mouth daily.  ? omeprazole (PRILOSEC) 40 MG capsule 90 capsule 0  ?  Sig: TAKE 1 CAPSULE(40 MG) BY MOUTH DAILY  ? sucralfate (CARAFATE) 1 g tablet 120 tablet 3  ?  Sig: Take 1 tablet (1 g total) by mouth 4 (four) times daily as needed (stomach acid).  ? ? ?Return in about 3 months (around 12/05/2021) for Follow-Up or next available hypertension, 6 months prediabetes  . ? ?[name], NP  ?

## 2021-09-04 ENCOUNTER — Encounter: Payer: Self-pay | Admitting: Internal Medicine

## 2021-09-04 ENCOUNTER — Other Ambulatory Visit: Payer: Self-pay

## 2021-09-04 ENCOUNTER — Ambulatory Visit: Payer: BC Managed Care – PPO | Admitting: Family

## 2021-09-04 VITALS — BP 124/83 | HR 92 | Temp 98.3°F | Resp 18 | Ht 67.99 in | Wt 173.0 lb

## 2021-09-04 DIAGNOSIS — K219 Gastro-esophageal reflux disease without esophagitis: Secondary | ICD-10-CM | POA: Diagnosis not present

## 2021-09-04 DIAGNOSIS — I10 Essential (primary) hypertension: Secondary | ICD-10-CM

## 2021-09-04 DIAGNOSIS — R7303 Prediabetes: Secondary | ICD-10-CM | POA: Diagnosis not present

## 2021-09-04 LAB — POCT GLYCOSYLATED HEMOGLOBIN (HGB A1C): Hemoglobin A1C: 5.8 % — AB (ref 4.0–5.6)

## 2021-09-04 MED ORDER — AMLODIPINE BESYLATE 10 MG PO TABS: 10.0000 mg | ORAL_TABLET | Freq: Every day | ORAL | 0 refills | Status: DC

## 2021-09-04 MED ORDER — OMEPRAZOLE 40 MG PO CPDR: DELAYED_RELEASE_CAPSULE | ORAL | 0 refills | Status: DC

## 2021-09-04 MED ORDER — SUCRALFATE 1 G PO TABS: 1.0000 g | ORAL_TABLET | Freq: Four times a day (QID) | ORAL | 3 refills | Status: DC | PRN

## 2021-09-04 NOTE — Progress Notes (Signed)
Prediabetes discussed in office.

## 2021-09-04 NOTE — Progress Notes (Signed)
Pt presents for hypertension follow-up  ?

## 2021-09-05 ENCOUNTER — Encounter: Payer: Self-pay | Admitting: Internal Medicine

## 2021-09-05 LAB — BASIC METABOLIC PANEL
BUN/Creatinine Ratio: 13 (ref 12–28)
BUN: 13 mg/dL (ref 8–27)
CO2: 25 mmol/L (ref 20–29)
Calcium: 9.5 mg/dL (ref 8.7–10.3)
Chloride: 103 mmol/L (ref 96–106)
Creatinine, Ser: 1.02 mg/dL — ABNORMAL HIGH (ref 0.57–1.00)
Glucose: 96 mg/dL (ref 70–99)
Potassium: 4.2 mmol/L (ref 3.5–5.2)
Sodium: 143 mmol/L (ref 134–144)
eGFR: 58 mL/min/{1.73_m2} — ABNORMAL LOW (ref >59)

## 2021-09-05 NOTE — Progress Notes (Signed)
Kidney function mildly decreased. Recheck kidney function at next appointment. If using over-the-counter medications such as Ibuprofen (Advil) or Naproxyn (Aleve) try to limit these. Instead use Tylenol or Acetaminophen.

## 2021-09-15 ENCOUNTER — Other Ambulatory Visit: Payer: Self-pay | Admitting: Family

## 2021-09-15 DIAGNOSIS — I10 Essential (primary) hypertension: Secondary | ICD-10-CM

## 2021-09-28 ENCOUNTER — Inpatient Hospital Stay: Payer: BC Managed Care – PPO

## 2021-09-29 ENCOUNTER — Ambulatory Visit (HOSPITAL_COMMUNITY)
Admission: RE | Admit: 2021-09-29 | Discharge: 2021-09-29 | Disposition: A | Discharge status: Home / Self Care | Payer: BC Managed Care – PPO | Source: Ambulatory Visit | Attending: Internal Medicine | Admitting: Internal Medicine

## 2021-09-29 DIAGNOSIS — C3490 Malignant neoplasm of unspecified part of unspecified bronchus or lung: Secondary | ICD-10-CM | POA: Diagnosis not present

## 2021-09-29 DIAGNOSIS — I700 Atherosclerosis of aorta: Secondary | ICD-10-CM | POA: Diagnosis not present

## 2021-09-29 DIAGNOSIS — J439 Emphysema, unspecified: Secondary | ICD-10-CM | POA: Diagnosis not present

## 2021-09-29 MED ORDER — SODIUM CHLORIDE (PF) 0.9 % IJ SOLN
INTRAMUSCULAR | Status: AC
  Filled 2021-09-29: qty 50

## 2021-09-29 MED ORDER — IOHEXOL 300 MG/ML  SOLN
75.0000 mL | Freq: Once | INTRAMUSCULAR | Status: AC | PRN
  Administered 2021-09-29: 75 mL via INTRAVENOUS

## 2021-10-01 ENCOUNTER — Other Ambulatory Visit: Payer: Self-pay

## 2021-10-01 ENCOUNTER — Encounter: Payer: Self-pay | Admitting: Internal Medicine

## 2021-10-01 ENCOUNTER — Inpatient Hospital Stay: Payer: BC Managed Care – PPO | Attending: Internal Medicine | Admitting: Internal Medicine

## 2021-10-01 VITALS — BP 142/92 | HR 95 | Temp 97.5°F | Resp 19 | Ht 67.99 in | Wt 181.5 lb

## 2021-10-01 DIAGNOSIS — C3491 Malignant neoplasm of unspecified part of right bronchus or lung: Secondary | ICD-10-CM | POA: Diagnosis not present

## 2021-10-01 DIAGNOSIS — Z923 Personal history of irradiation: Secondary | ICD-10-CM | POA: Insufficient documentation

## 2021-10-01 DIAGNOSIS — C3411 Malignant neoplasm of upper lobe, right bronchus or lung: Secondary | ICD-10-CM | POA: Diagnosis not present

## 2021-10-01 DIAGNOSIS — Z9221 Personal history of antineoplastic chemotherapy: Secondary | ICD-10-CM | POA: Diagnosis not present

## 2021-10-01 DIAGNOSIS — C3490 Malignant neoplasm of unspecified part of unspecified bronchus or lung: Secondary | ICD-10-CM

## 2021-10-01 DIAGNOSIS — Z85118 Personal history of other malignant neoplasm of bronchus and lung: Secondary | ICD-10-CM | POA: Insufficient documentation

## 2021-10-01 DIAGNOSIS — Z7982 Long term (current) use of aspirin: Secondary | ICD-10-CM | POA: Insufficient documentation

## 2021-10-01 DIAGNOSIS — Z79899 Other long term (current) drug therapy: Secondary | ICD-10-CM | POA: Insufficient documentation

## 2021-10-01 NOTE — Progress Notes (Signed)
?    [facility] ?[phone]   [phone] ? ?OFFICE PROGRESS NOTE ? ?Christina Herter, NP ?[address] ?[place] [address] ? ?DIAGNOSIS: Stage IIIB (T4, N2, M0) non-small cell lung cancer, poorly differentiated squamous cell carcinoma.  She presented with large right upper lobe lung mass in addition to right hilar and mediastinal lymphadenopathy diagnosed in January 2020. ? ?PRIOR THERAPY:  ?1) Concurrent chemoradiation with weekly carboplatin for AUC of 2 and paclitaxel 45 mg/M2.  First dose July 13, 2018.  Status post 8 cycles.  Last dose was given August 31, 2018. ?2) Consolidation treatment with immunotherapy with Imfinzi 10 mg/KG every 2 weeks.  First dose starts October 13, 2018.  Status post 26 cycles. ? ?CURRENT THERAPY: Observation. ? ?INTERVAL HISTORY: ?Christina [name] 76 y.o. female returns to the clinic today for 73-month follow-up visit.  The patient is feeling fine today with no concerning complaints.  She is still very active and she works few days a week.  She denied having any current chest pain, shortness of breath, cough or hemoptysis.  She has no nausea, vomiting, diarrhea or constipation.  She has no headache or visual changes.  She denied having any weight loss or night sweats.  She is here today for evaluation with repeat CT scan of the chest for restaging of her disease. ? ?MEDICAL HISTORY: ?Past Medical History:  ?Diagnosis Date  ? Allergy   ? GERD (gastroesophageal reflux disease)   ? Hyperlipidemia   ? Hypertension   ? nscl ca dx'd 05/2018  ? lung cancer  ? Reflux   ? Tubulovillous adenoma of colon 2003  ? ? ?ALLERGIES:  is allergic to coconut oil, codeine, shrimp [shellfish allergy], and trandolapril. ? ?MEDICATIONS:  ?Current Outpatient Medications  ?Medication Sig Dispense Refill  ? albuterol (VENTOLIN HFA) 108 (90 Base) MCG/ACT inhaler INHALE 1 TO 2 PUFFS INTO THE LUNGS EVERY 6 HOURS AS NEEDED FOR WHEEZING OR SHORTNESS OF BREATH 20.1  g 1  ? amLODipine (NORVASC) 10 MG tablet Take 1 tablet (10 mg total) by mouth daily. 90 tablet 0  ? aspirin EC 81 MG tablet Take 81 mg by mouth daily.    ? atorvastatin (LIPITOR) 40 MG tablet TAKE 1 TABLET BY MOUTH DAILY 90 tablet 2  ? fluticasone (FLONASE) 50 MCG/ACT nasal spray Place 2 sprays into both nostrils daily. 16 g 6  ? omeprazole (PRILOSEC) 40 MG capsule TAKE 1 CAPSULE(40 MG) BY MOUTH DAILY 90 capsule 0  ? potassium chloride (KLOR-CON M) 10 MEQ tablet TAKE 1 TABLET(10 MEQ) BY MOUTH TWICE DAILY 90 tablet 0  ? sucralfate (CARAFATE) 1 g tablet Take 1 tablet (1 g total) by mouth 4 (four) times daily as needed (stomach acid). 120 tablet 3  ? ?No current facility-administered medications for this visit.  ? ? ?SURGICAL HISTORY:  ?Past Surgical History:  ?Procedure Laterality Date  ? CESAREAN SECTION    ? 1 time  ? COLONOSCOPY    ? POLYPECTOMY    ? VIDEO BRONCHOSCOPY WITH ENDOBRONCHIAL NAVIGATION N/A 06/24/2018  ? Procedure: VIDEO BRONCHOSCOPY WITH ENDOBRONCHIAL NAVIGATION with biopsies, right upper lung lobe;  Surgeon: Melrose Nakayama, MD;  Location: [facility] OR;  Service: Thoracic;  Laterality: N/A;  ? VIDEO BRONCHOSCOPY WITH ENDOBRONCHIAL ULTRASOUND N/A 06/24/2018  ? Procedure: VIDEO BRONCHOSCOPY WITH ENDOBRONCHIAL ULTRASOUND, right upper lung lobe;  Surgeon: Melrose Nakayama, MD;  Location: [facility] OR;  Service: Thoracic;  Laterality: N/A;  ? ? ?REVIEW OF SYSTEMS:  A comprehensive review of systems was negative.  ? ?PHYSICAL EXAMINATION: General appearance: alert, cooperative, and no distress ?Head: Normocephalic, without obvious abnormality, atraumatic ?Neck: no adenopathy, no JVD, supple, symmetrical, trachea midline, and thyroid not enlarged, symmetric, no tenderness/mass/nodules ?Lymph nodes: Cervical, supraclavicular, and axillary nodes normal. ?Resp: clear to auscultation bilaterally ?Back: symmetric, no curvature. ROM normal. No CVA tenderness. ?Cardio: regular rate and rhythm, S1, S2 normal, no murmur,  click, rub or gallop ?GI: soft, non-tender; bowel sounds normal; no masses,  no organomegaly ?Extremities: extremities normal, atraumatic, no cyanosis or edema ? ?ECOG PERFORMANCE STATUS: 0 - Asymptomatic ? ?Blood pressure (!) 142/92, pulse 95, temperature (!) 97.5 ?F (36.4 ?C), temperature source Tympanic, resp. rate 19, height 5' 7.99" (1.727 m), weight 181 lb 8 oz (82.3 kg), SpO2 96 %. ? ?LABORATORY DATA: ?Lab Results  ?Component Value Date  ? WBC 5.4 03/26/2021  ? HGB 13.4 03/26/2021  ? HCT 41.6 03/26/2021  ? MCV 90.0 03/26/2021  ? PLT 309 03/26/2021  ? ? ?  Chemistry   ?   ?Component Value Date/Time  ? NA 143 09/04/2021 0947  ? K 4.2 09/04/2021 0947  ? CL 103 09/04/2021 0947  ? CO2 25 09/04/2021 0947  ? BUN 13 09/04/2021 0947  ? CREATININE 1.02 (H) 09/04/2021 0947  ? CREATININE 0.97 03/26/2021 0938  ? CREATININE 0.87 03/22/2016 0908  ?    ?Component Value Date/Time  ? CALCIUM 9.5 09/04/2021 0947  ? ALKPHOS 93 03/26/2021 0938  ? AST 15 03/26/2021 0938  ? ALT 13 03/26/2021 0938  ? BILITOT 0.4 03/26/2021 2025  ?  ? ? ? ?RADIOGRAPHIC STUDIES: ?CT Chest W Contrast ? ?Result Date: 10/01/2021 ?CLINICAL DATA:  Restaging non-small cell lung cancer. * Tracking Code: BO * EXAM: CT CHEST WITH CONTRAST TECHNIQUE: Multidetector CT imaging of the chest was performed during intravenous contrast administration. RADIATION DOSE REDUCTION: This exam was performed according to the departmental dose-optimization program which includes automated exposure control, adjustment of the mA and/or kV according to patient size and/or use of iterative reconstruction technique. CONTRAST:  83mL OMNIPAQUE IOHEXOL 300 MG/ML  SOLN COMPARISON:  Multiple priors including most recent chest CT March 26, 2021. FINDINGS: Cardiovascular: Aortic and branch vessel atherosclerosis without thoracic aortic aneurysm. No central pulmonary embolus on this nondedicated study. Normal size heart. Small pericardial effusion unchanged from prior. Mediastinum/Nodes:  Heterogeneous nodular enlargement left lobe of the thyroid measures up to 3.4 cm, similar to prior. Trachea is slightly effaced by the enlarged left thyroid lobe but appears patent. No pathologically enlarged mediastinal, hilar or axillary lymph nodes. The esophagus is grossly unremarkable. Lungs/Pleura: Similar changes related to external beam radiation in the in the right upper lobe with fibrosis, architectural distortion and bandlike area of soft tissue thickening. The area of soft tissue thickening measures up to 1.6 cm in thickness, unchanged from multiple priors when remeasured for consistency. Stable 2 mm nodule in the superior segment right lower lobe on image 80/7. No new suspicious pulmonary nodules or masses. Moderate centrilobular emphysema with diffuse bronchial wall thickening. No pneumothorax. No pleural effusion. Upper Abdomen: Stable 4 mm hypodensity in the right lobe of the liver on image 158/2. Nodular thickening of the left adrenal gland is stable over multiple priors dating back to at least PET-CT July 02, 2018 without abnormal FDG avidity on that study. Musculoskeletal: Mild multilevel degenerative changes spine. Post radiation change in the thoracic spine. No aggressive lytic or blastic lesion of bone. Remote right anterior third rib fracture. IMPRESSION:  1. Stable changes post treatment change in the right lung. No evidence of recurrent or metastatic disease. 2. Heterogeneous nodular enlargement of the left lobe of the thyroid measuring up to 3.4 cm. Recommend thyroid US if not previously performed (ref: J Am Coll Radiol. 2015 Feb;12(2): 143-50). 3. Aortic Atherosclerosis (ICD10-I70.0) and Emphysema (ICD10-J43.9). Electronically Signed   By: Dahlia Bailiff M.D.   On: 10/01/2021 09:27   ? ? ?ASSESSMENT AND PLAN: This is a very pleasant 76 years old African-American female recently diagnosed with a stage IIIB (T4, N2, M0) non-small cell lung cancer, poorly differentiated squamous cell  carcinoma presented with right upper lobe lung mass in addition to right hilar and mediastinal lymphadenopathy. ?The patient underwent concurrent chemoradiation with weekly carboplatin and paclitaxel status p

## 2021-10-16 ENCOUNTER — Telehealth: Payer: BC Managed Care – PPO | Admitting: Family Medicine

## 2021-10-16 ENCOUNTER — Encounter: Payer: Self-pay | Admitting: Internal Medicine

## 2021-10-16 DIAGNOSIS — J019 Acute sinusitis, unspecified: Secondary | ICD-10-CM

## 2021-10-16 DIAGNOSIS — R051 Acute cough: Secondary | ICD-10-CM

## 2021-10-16 DIAGNOSIS — B9689 Other specified bacterial agents as the cause of diseases classified elsewhere: Secondary | ICD-10-CM

## 2021-10-16 MED ORDER — AMOXICILLIN-POT CLAVULANATE 875-125 MG PO TABS: 1.0000 | ORAL_TABLET | Freq: Two times a day (BID) | ORAL | 0 refills | Status: AC

## 2021-10-16 MED ORDER — BENZONATATE 100 MG PO CAPS: 100.0000 mg | ORAL_CAPSULE | Freq: Two times a day (BID) | ORAL | 0 refills | Status: DC | PRN

## 2021-10-16 NOTE — Patient Instructions (Signed)

## 2021-10-16 NOTE — Progress Notes (Signed)
?Virtual Visit Consent  ? ?Christina [name], you are scheduled for a virtual visit with a [facility] provider today.   ?  ?Just as with appointments in the office, your consent must be obtained to participate.  Your consent will be active for this visit and any virtual visit you may have with one of our providers in the next 365 days.   ?  ?If you have a MyChart account, a copy of this consent can be sent to you electronically.  All virtual visits are billed to your insurance company just like a traditional visit in the office.   ? ?As this is a virtual visit, video technology does not allow for your provider to perform a traditional examination.  This may limit your provider's ability to fully assess your condition.  If your provider identifies any concerns that need to be evaluated in person or the need to arrange testing (such as labs, EKG, etc.), we will make arrangements to do so.   ?  ?Although advances in technology are sophisticated, we cannot ensure that it will always work on either your end or our end.  If the connection with a video visit is poor, the visit may have to be switched to a telephone visit.  With either a video or telephone visit, we are not always able to ensure that we have a secure connection.   ? ?Also, by engaging in this virtual visit, you consent to the provision of healthcare. Additionally, you authorize for your insurance to be billed (if applicable) for the services provided during this visit. I also discussed with the patient that there may be a patient responsible charge related to this service. ? ?I need to obtain your verbal consent now.   Are you willing to proceed with your visit today?  ?  ?ELLAKATE [name] has provided verbal consent on 10/16/2021 for a virtual visit (video or telephone). ?  ?Perlie Mayo, NP  ? ?Date: 10/16/2021 9:03 AM ? ? ?Virtual Visit via Video Note  ? ?Christina [name], connected with  Christina [name]  ([id number], [birth year]) on 10/16/21 at  9:00 AM EDT  by a video-enabled telemedicine application and verified that I am speaking with the correct person using two identifiers. ? ?Location: ?Patient: Virtual Visit Location Patient: Home ?Provider: Virtual Visit Location Provider: Home Office ?  ?I discussed the limitations of evaluation and management by telemedicine and the availability of in person appointments. The patient expressed understanding and agreed to proceed.   ? ?History of Present Illness: ?Christina [name] is a 76 y.o. who identifies as a female who was assigned female at birth, and is being seen today for cough. Onset 7 days ago. Congestion and cough started. Headache and mild sore throat. Denies known sick contacts. Denies chest pain, shortness of breath, ear pain. T max 100-101. Has taken OTC mucinex without relief. Cough and on going congestion as the worse at this time and what she would like help with. ? ?Problems:  ?Patient Active Problem List  ? Diagnosis Date Noted  ? Prediabetes 03/07/2021  ? Encounter for antineoplastic immunotherapy 10/06/2018  ? Stage III squamous cell carcinoma of right lung ([place]) 07/06/2018  ? Goals of care, counseling/discussion 07/06/2018  ? Encounter for antineoplastic chemotherapy 07/06/2018  ? Primary cancer of right upper lobe of lung ([place]) 06/18/2018  ? Acute URI 05/25/2018  ? Seasonal allergies 10/06/2017  ? Thyromegaly 07/01/2017  ? Colon polyps 04/02/2017  ? Former smoker 09/27/2016  ?  Smoker 09/27/2016  ? Cough 04/25/2016  ? 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  ? Angioedema 08/04/2014  ? GERD (gastroesophageal reflux disease) 08/10/2013  ? Other and unspecified hyperlipidemia 06/23/2013  ? Essential hypertension 07/23/2012  ?  ?Allergies:  ?Allergies  ?Allergen Reactions  ? Coconut Oil Hives  ? Codeine Other (See Comments)  ?  Upsets stomach really bad  ? Shrimp [Shellfish Allergy] Hives  ? Trandolapril Swelling  ?  Swelling of the face/lips 07/2014  ? ?Medications:  ?Current Outpatient Medications:  ?   albuterol (VENTOLIN HFA) 108 (90 Base) MCG/ACT inhaler, INHALE 1 TO 2 PUFFS INTO THE LUNGS EVERY 6 HOURS AS NEEDED FOR WHEEZING OR SHORTNESS OF BREATH, Disp: 20.1 g, Rfl: 1 ?  amLODipine (NORVASC) 10 MG tablet, Take 1 tablet (10 mg total) by mouth daily., Disp: 90 tablet, Rfl: 0 ?  aspirin EC 81 MG tablet, Take 81 mg by mouth daily., Disp: , Rfl:  ?  atorvastatin (LIPITOR) 40 MG tablet, TAKE 1 TABLET BY MOUTH DAILY, Disp: 90 tablet, Rfl: 2 ?  fluticasone (FLONASE) 50 MCG/ACT nasal spray, Place 2 sprays into both nostrils daily., Disp: 16 g, Rfl: 6 ?  omeprazole (PRILOSEC) 40 MG capsule, TAKE 1 CAPSULE(40 MG) BY MOUTH DAILY, Disp: 90 capsule, Rfl: 0 ?  potassium chloride (KLOR-CON M) 10 MEQ tablet, TAKE 1 TABLET(10 MEQ) BY MOUTH TWICE DAILY, Disp: 90 tablet, Rfl: 0 ?  sucralfate (CARAFATE) 1 g tablet, Take 1 tablet (1 g total) by mouth 4 (four) times daily as needed (stomach acid)., Disp: 120 tablet, Rfl: 3 ? ?Observations/Objective: ?Patient is well-developed, well-nourished in no acute distress.  ?Resting comfortably  at home.  ?Head is normocephalic, atraumatic.  ?No labored breathing.  ?Speech is clear and coherent with logical content.  ?Patient is alert and oriented at baseline.  ?Cough ?Nasal tone ? ?Assessment and Plan: ?1. Acute bacterial sinusitis ? ?- amoxicillin-clavulanate (AUGMENTIN) 875-125 MG tablet; Take 1 tablet by mouth 2 (two) times daily for 7 days.  Dispense: 14 tablet; Refill: 0 ?- benzonatate (TESSALON) 100 MG capsule; Take 1 capsule (100 mg total) by mouth 2 (two) times daily as needed for cough.  Dispense: 20 capsule; Refill: 0 ? ?2. Acute cough ? ?- benzonatate (TESSALON) 100 MG capsule; Take 1 capsule (100 mg total) by mouth 2 (two) times daily as needed for cough.  Dispense: 20 capsule; Refill: 0 ? ?-S&S appear to be bacterial sinus infection post a viral cold  ?-Tx as per above and advised f/u in person if not improved ?-OTC discussed and info on AVS  ? ? Reviewed side effects, risks  and benefits of medication.   ? ?Patient acknowledged agreement and understanding of the plan.  ? ? ?Follow Up Instructions: ?I discussed the assessment and treatment plan with the patient. The patient was provided an opportunity to ask questions and all were answered. The patient agreed with the plan and demonstrated an understanding of the instructions.  A copy of instructions were sent to the patient via MyChart unless otherwise noted below.  ? ? ?The patient was advised to call back or seek an in-person evaluation if the symptoms worsen or if the condition fails to improve as anticipated. ? ?Time:  ?I spent 15 minutes with the patient via telehealth technology discussing the above problems/concerns.   ? ?Perlie Mayo, NP ? ?

## 2021-10-22 ENCOUNTER — Other Ambulatory Visit: Payer: Self-pay | Admitting: Family

## 2021-10-22 DIAGNOSIS — J3089 Other allergic rhinitis: Secondary | ICD-10-CM

## 2021-10-22 DIAGNOSIS — R062 Wheezing: Secondary | ICD-10-CM

## 2021-10-22 NOTE — Telephone Encounter (Signed)
Albuterol refilled per patient request.  ?

## 2021-11-12 ENCOUNTER — Encounter: Payer: Self-pay | Admitting: Physician Assistant

## 2021-11-12 ENCOUNTER — Telehealth: Payer: BC Managed Care – PPO | Admitting: Physician Assistant

## 2021-11-12 DIAGNOSIS — R051 Acute cough: Secondary | ICD-10-CM | POA: Diagnosis not present

## 2021-11-12 MED ORDER — DOXYCYCLINE HYCLATE 100 MG PO CAPS: 100.0000 mg | ORAL_CAPSULE | Freq: Two times a day (BID) | ORAL | 0 refills | Status: AC

## 2021-11-12 MED ORDER — BENZONATATE 100 MG PO CAPS: 100.0000 mg | ORAL_CAPSULE | Freq: Three times a day (TID) | ORAL | 0 refills | Status: AC

## 2021-11-12 NOTE — Patient Instructions (Signed)
Christina [name], thank you for joining Rodney Booze, PA-C for today's virtual visit.  While this provider is not your primary care provider (PCP), if your PCP is located in our provider database this encounter information will be shared with them immediately following your visit.  Consent: (Patient) Christina [name] provided verbal consent for this virtual visit at the beginning of the encounter.  Current Medications:  Current Outpatient Medications:    benzonatate (TESSALON) 100 MG capsule, Take 1 capsule (100 mg total) by mouth every 8 (eight) hours for 5 days., Disp: 15 capsule, Rfl: 0   doxycycline (VIBRAMYCIN) 100 MG capsule, Take 1 capsule (100 mg total) by mouth 2 (two) times daily for 7 days., Disp: 14 capsule, Rfl: 0   albuterol (VENTOLIN HFA) 108 (90 Base) MCG/ACT inhaler, INHALE 1 TO 2 PUFFS INTO THE LUNGS EVERY 6 HOURS AS NEEDED FOR WHEEZING OR SHORTNESS OF BREATH, Disp: 6.7 g, Rfl: 1   amLODipine (NORVASC) 10 MG tablet, Take 1 tablet (10 mg total) by mouth daily., Disp: 90 tablet, Rfl: 0   aspirin EC 81 MG tablet, Take 81 mg by mouth daily., Disp: , Rfl:    atorvastatin (LIPITOR) 40 MG tablet, TAKE 1 TABLET BY MOUTH DAILY, Disp: 90 tablet, Rfl: 2   benzonatate (TESSALON) 100 MG capsule, Take 1 capsule (100 mg total) by mouth 2 (two) times daily as needed for cough., Disp: 20 capsule, Rfl: 0   fluticasone (FLONASE) 50 MCG/ACT nasal spray, Place 2 sprays into both nostrils daily., Disp: 16 g, Rfl: 6   omeprazole (PRILOSEC) 40 MG capsule, TAKE 1 CAPSULE(40 MG) BY MOUTH DAILY, Disp: 90 capsule, Rfl: 0   potassium chloride (KLOR-CON M) 10 MEQ tablet, TAKE 1 TABLET(10 MEQ) BY MOUTH TWICE DAILY, Disp: 90 tablet, Rfl: 0   sucralfate (CARAFATE) 1 g tablet, Take 1 tablet (1 g total) by mouth 4 (four) times daily as needed (stomach acid)., Disp: 120 tablet, Rfl: 3   Medications ordered in this encounter:  Meds ordered this encounter  Medications   doxycycline (VIBRAMYCIN) 100 MG capsule     Sig: Take 1 capsule (100 mg total) by mouth 2 (two) times daily for 7 days.    Dispense:  14 capsule    Refill:  0    Order Specific Question:   Supervising Provider    Answer:   MILLER, BRIAN [[id number]]   benzonatate (TESSALON) 100 MG capsule    Sig: Take 1 capsule (100 mg total) by mouth every 8 (eight) hours for 5 days.    Dispense:  15 capsule    Refill:  0    Order Specific Question:   Supervising Provider    Answer:   Sabra Heck, [place]     *If you need refills on other medications prior to your next appointment, please contact your pharmacy*  Follow-Up: Call back or seek an in-person evaluation if the symptoms worsen or if the condition fails to improve as anticipated.  Other Instructions You were given a prescription for antibiotics. Please take the antibiotic prescription fully.    Take tessalon as directed and use your albuterol inhaler every 6 hours for the next 5-7 days   Follow up with your regular doctor in 2-3 days for reassessment and seek care sooner if your symptoms worsen or fail to improve.    If you have been instructed to have an in-person evaluation today at a local Urgent Care facility, please use the link below. It will take you to a  list of all of our available [facility], including address, phone number and hours of operation. Please do not delay care.  [facility]  If you or a family member do not have a primary care provider, use the link below to schedule a visit and establish care. When you choose a [facility] primary care physician or advanced practice provider, you gain a long-term partner in health. Find a Primary Care Provider  Learn more about [facility]'s in-office and virtual care options: [facility]

## 2021-11-12 NOTE — Progress Notes (Signed)
Christina [name], Christina [name] are scheduled for a virtual visit with your provider today.    Just as we do with appointments in the office, we must obtain your consent to participate.  Your consent will be active for this visit and any virtual visit you may have with one of our providers in the next 365 days.    If you have a MyChart account, I can also send a copy of this consent to you electronically.  All virtual visits are billed to your insurance company just like a traditional visit in the office.  As this is a virtual visit, video technology does not allow for your provider to perform a traditional examination.  This may limit your provider's ability to fully assess your condition.  If your provider identifies any concerns that need to be evaluated in person or the need to arrange testing such as labs, EKG, etc, we will make arrangements to do so.    Although advances in technology are sophisticated, we cannot ensure that it will always work on either your end or our end.  If the connection with a video visit is poor, we may have to switch to a telephone visit.  With either a video or telephone visit, we are not always able to ensure that we have a secure connection.   I need to obtain your verbal consent now.   Are you willing to proceed with your visit today?   Christina [name] has provided verbal consent on 11/12/2021 for a virtual visit (video or telephone).   Rodney Booze, PA-C 11/12/2021  10:52 AM   Date:  11/12/2021   ID:  Christina [name], DOB January 20, 1946, MRN [id number]  Patient Location: Home Provider Location: Home Office   Participants: Patient and Provider for Visit and Wrap up  Method of visit: Video  Location of Patient: Home Location of Provider: Home Office Consent was obtain for visit over the video. Services rendered by provider: Visit was performed via video  A video enabled telemedicine application was used and I verified that I am speaking with the correct person using two  identifiers.  PCP:  [name], NP   Chief Complaint:  cough  History of Present Illness:    Christina [name] is a 76 y.o. female with history as stated below. Presents video telehealth for an acute care visit  Pt states she has had a cough for the last week. She states the cough is nonproductive. She has had some wheezing. She has tried her albuterol without relief. She has also had rhinorrhea which has improved somewhat.  She denies fevers, chills, chest pain, hemoptysis, or shortness of breath. She states she feels like when she has had bronchitis in the past.  Past Medical, Surgical, Social History, Allergies, and Medications have been Reviewed.  Past Medical History:  Diagnosis Date   Allergy    GERD (gastroesophageal reflux disease)    Hyperlipidemia    Hypertension    nscl ca dx'd 05/2018   lung cancer   Reflux    Tubulovillous adenoma of colon 2003    Current Meds  Medication Sig   benzonatate (TESSALON) 100 MG capsule Take 1 capsule (100 mg total) by mouth every 8 (eight) hours for 5 days.   doxycycline (VIBRAMYCIN) 100 MG capsule Take 1 capsule (100 mg total) by mouth 2 (two) times daily for 7 days.     Allergies:   Coconut (cocos nucifera), Codeine, Shrimp [shellfish allergy], and Trandolapril   ROS See  HPI for history of present illness.  Physical Exam Constitutional:      Appearance: Normal appearance.  Pulmonary:     Effort: Pulmonary effort is normal.     Comments: Speaking in full sentences without difficulty Neurological:     Mental Status: She is alert.            MDM: cough x1 week, no fevers, mild wheezing. No cp, sob. Likely bronchitis though given hx will cover her for atypical pneumonia with doxycycline. Rx for tessalon also sent. Advised on close f/u.   There are no diagnoses linked to this encounter.   Time:   Today, I have spent 8 minutes with the patient with telehealth technology discussing the above problems, reviewing the chart,  previous notes, medications and orders.    Tests Ordered: No orders of the defined types were placed in this encounter.   Medication Changes: Meds ordered this encounter  Medications   doxycycline (VIBRAMYCIN) 100 MG capsule    Sig: Take 1 capsule (100 mg total) by mouth 2 (two) times daily for 7 days.    Dispense:  14 capsule    Refill:  0    Order Specific Question:   Supervising Provider    Answer:   MILLER, BRIAN [[id number]]   benzonatate (TESSALON) 100 MG capsule    Sig: Take 1 capsule (100 mg total) by mouth every 8 (eight) hours for 5 days.    Dispense:  15 capsule    Refill:  0    Order Specific Question:   Supervising Provider    Answer:   Noemi Chapel [[id number]]     Disposition:  Follow up  Signed, Nikolai, PA-C  11/12/2021 10:52 AM

## 2021-11-20 ENCOUNTER — Other Ambulatory Visit: Payer: Self-pay | Admitting: Family

## 2021-11-20 DIAGNOSIS — K219 Gastro-esophageal reflux disease without esophagitis: Secondary | ICD-10-CM

## 2021-11-20 NOTE — Telephone Encounter (Signed)
Refilled per patient request. 

## 2021-11-27 NOTE — Progress Notes (Signed)
Patient ID: Christina [name], female    DOB: [birth year]  MRN: [id number]  CC: Hypertension Follow-Up  Subjective: Christina [name] is a 76 y.o. female who presents for hypertension follow-up.   Her concerns today include:  Hypertension follow-up: 09/04/2021: - Continue Amlodipine as prescribed.   12/05/2021: Doing well on current regimen. No side effects. No issues/concerns. Denies chest pain, shortness of breath, worst headache of life and additional red flag symptoms.   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[place]) 07/06/2018   Goals of care, counseling/discussion 07/06/2018   Encounter for antineoplastic chemotherapy 07/06/2018   Primary cancer of right upper lobe of lung ([place]) 06/18/2018   Acute URI 05/25/2018   Seasonal allergies 10/06/2017   Thyromegaly 07/01/2017   Colon polyps 04/02/2017   Former smoker 09/27/2016   Smoker 09/27/2016   Cough 04/25/2016   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   Angioedema 08/04/2014   GERD (gastroesophageal reflux disease) 08/10/2013   Other and unspecified hyperlipidemia 06/23/2013   Essential hypertension 07/23/2012     Current Outpatient Medications on File Prior to Visit  Medication Sig Dispense Refill   albuterol (VENTOLIN HFA) 108 (90 Base) MCG/ACT inhaler INHALE 1 TO 2 PUFFS INTO THE LUNGS EVERY 6 HOURS AS NEEDED FOR WHEEZING OR SHORTNESS OF BREATH 6.7 g 1   aspirin EC 81 MG tablet Take 81 mg by mouth daily.     atorvastatin (LIPITOR) 40 MG tablet TAKE 1 TABLET BY MOUTH DAILY 90 tablet 2   benzonatate (TESSALON) 100 MG capsule Take 1 capsule (100 mg total) by mouth 2 (two) times daily as needed for cough. (Patient not taking: Reported on 12/05/2021) 20 capsule 0   fluticasone (FLONASE) 50 MCG/ACT nasal spray Place 2 sprays into both nostrils daily. 16 g 6   omeprazole (PRILOSEC)  40 MG capsule TAKE 1 CAPSULE(40 MG) BY MOUTH DAILY 90 capsule 1   potassium chloride (KLOR-CON M) 10 MEQ tablet TAKE 1 TABLET(10 MEQ) BY MOUTH TWICE DAILY 90 tablet 0   sucralfate (CARAFATE) 1 g tablet Take 1 tablet (1 g total) by mouth 4 (four) times daily as needed (stomach acid). 120 tablet 3   No current facility-administered medications on file prior to visit.    Allergies  Allergen Reactions   Coconut (Cocos Nucifera) Hives   Codeine Other (See Comments)    Upsets stomach really bad   Shrimp [Shellfish Allergy] Hives   Trandolapril Swelling    Swelling of the face/lips 07/2014    Social History   Socioeconomic History   Marital status: Married    Spouse name: [name]   Number of children: 1   Years of education: 12th grade   Highest education level: Not on file  Occupational History   Occupation: school housekeeping    Comment: part-time  Tobacco Use   Smoking status: Former    Packs/day: 0.50    Years: 50.00    Total pack years: 25.00    Types: Cigarettes    Passive exposure: Past   Smokeless tobacco: Never   Tobacco comments:    Previously quit in 2015 but has started smoking again   Vaping Use   Vaping Use: Never used  Substance and Sexual Activity   Alcohol use: No    Alcohol/week: 0.0 standard drinks of alcohol   Drug use: No   Sexual activity: Not Currently  Other  Topics Concern   Not on file  Social History Narrative   Lives with her husband. Her daughter also lives in [place] with her husband and 3 children.   Social Determinants of Health   Financial Resource Strain: Not on file  Food Insecurity: Not on file  Transportation Needs: Not on file  Physical Activity: Not on file  Stress: Not on file  Social Connections: Not on file  Intimate Partner Violence: Not on file    Family History  Problem Relation Age of Onset   Hypertension Mother    Heart disease Mother    Leukemia Father    Cancer Father    Hypertension Sister    Hypertension  Sister    Colon cancer Neg Hx    Colon polyps Neg Hx    Stomach cancer Neg Hx    Esophageal cancer Neg Hx     Past Surgical History:  Procedure Laterality Date   CESAREAN SECTION     1 time   COLONOSCOPY     POLYPECTOMY     VIDEO BRONCHOSCOPY WITH ENDOBRONCHIAL NAVIGATION N/A 06/24/2018   Procedure: VIDEO BRONCHOSCOPY WITH ENDOBRONCHIAL NAVIGATION with biopsies, right upper lung lobe;  Surgeon: Melrose Nakayama, MD;  Location: [facility] OR;  Service: Thoracic;  Laterality: N/A;   VIDEO BRONCHOSCOPY WITH ENDOBRONCHIAL ULTRASOUND N/A 06/24/2018   Procedure: VIDEO BRONCHOSCOPY WITH ENDOBRONCHIAL ULTRASOUND, right upper lung lobe;  Surgeon: Melrose Nakayama, MD;  Location: [facility] OR;  Service: Thoracic;  Laterality: N/A;    ROS: Review of Systems Negative except as stated above  PHYSICAL EXAM: BP 124/85 (BP Location: Left Arm, Patient Position: Sitting, Cuff Size: Normal)   Pulse 96   Temp 98.3 F (36.8 C)   Resp 16   Ht 5' 7.99" (1.727 m)   Wt 172 lb (78 kg)   SpO2 94%   BMI 26.16 kg/m   Physical Exam HENT:     Head: Normocephalic and atraumatic.  Eyes:     Extraocular Movements: Extraocular movements intact.     Conjunctiva/sclera: Conjunctivae normal.     Pupils: Pupils are equal, round, and reactive to light.  Cardiovascular:     Rate and Rhythm: Normal rate and regular rhythm.     Pulses: Normal pulses.     Heart sounds: Normal heart sounds.  Pulmonary:     Effort: Pulmonary effort is normal.     Breath sounds: Normal breath sounds.  Musculoskeletal:     Cervical back: Normal range of motion and neck supple.  Neurological:     General: No focal deficit present.     Mental Status: She is alert and oriented to person, place, and time.  Psychiatric:        Mood and Affect: Mood normal.        Behavior: Behavior normal.    ASSESSMENT AND PLAN: 1. Essential (primary) hypertension - Continue Amlodipine as prescribed.  - Counseled on blood pressure goal of less than  140/90, low-sodium, DASH diet, medication compliance, 150 minutes of moderate intensity exercise per week as tolerated. Discussed medication compliance, adverse effects. - Update BMP.  - Follow-up with primary provider in 3 months or sooner if needed. - Basic Metabolic Panel - amLODipine (NORVASC) 10 MG tablet; Take 1 tablet (10 mg total) by mouth daily.  Dispense: 90 tablet; Refill: 0    Patient was given the opportunity to ask questions.  Patient verbalized understanding of the plan and was able to repeat key elements of the plan. Patient was given  clear instructions to go to Emergency Department or return to medical center if symptoms don't improve, worsen, or new problems develop.The patient verbalized understanding.   Orders Placed This Encounter  Procedures   Basic Metabolic Panel     Requested Prescriptions   Signed Prescriptions Disp Refills   amLODipine (NORVASC) 10 MG tablet 90 tablet 0    Sig: Take 1 tablet (10 mg total) by mouth daily.    Return in about 3 months (around 03/07/2022) for Follow-Up or next available HTN.  [name], NP

## 2021-11-28 ENCOUNTER — Other Ambulatory Visit: Payer: Self-pay | Admitting: Family

## 2021-11-28 DIAGNOSIS — I10 Essential (primary) hypertension: Secondary | ICD-10-CM

## 2021-11-28 NOTE — Telephone Encounter (Signed)
Refilled per patient request. 

## 2021-12-05 ENCOUNTER — Encounter: Payer: Self-pay | Admitting: Family

## 2021-12-05 ENCOUNTER — Ambulatory Visit: Payer: BC Managed Care – PPO | Admitting: Family

## 2021-12-05 VITALS — BP 124/85 | HR 96 | Temp 98.3°F | Resp 16 | Ht 67.99 in | Wt 172.0 lb

## 2021-12-05 DIAGNOSIS — I10 Essential (primary) hypertension: Secondary | ICD-10-CM | POA: Diagnosis not present

## 2021-12-05 MED ORDER — AMLODIPINE BESYLATE 10 MG PO TABS: 10.0000 mg | ORAL_TABLET | Freq: Every day | ORAL | 0 refills | Status: DC

## 2021-12-05 NOTE — Progress Notes (Signed)
.  Pt presents for hypertension f/u

## 2021-12-05 NOTE — Patient Instructions (Signed)
Amlodipine Tablets What is this medication? AMLODIPINE (am LOE di peen) treats high blood pressure and prevents chest pain (angina). It works by relaxing the blood vessels, which helps decrease the amount of work your heart has to do. It belongs to a group of medications called calcium channel blockers. This medicine may be used for other purposes; ask your health care provider or pharmacist if you have questions. COMMON BRAND NAME(S): Norvasc What should I tell my care team before I take this medication? They need to know if you have any of these conditions: Heart disease Liver disease An unusual or allergic reaction to amlodipine, other medications, foods, dyes, or preservatives Pregnant or trying to get pregnant Breast-feeding How should I use this medication? Take this medication by mouth. Take it as directed on the prescription label at the same time every day. You can take it with or without food. If it upsets your stomach, take it with food. Keep taking it unless your care team tells you to stop. Talk to your care team about the use of this medication in children. While it may be prescribed for children as young as 6 for selected conditions, precautions do apply. Overdosage: If you think you have taken too much of this medicine contact a poison control center or emergency room at once. NOTE: This medicine is only for you. Do not share this medicine with others. What if I miss a dose? If you miss a dose, take it as soon as you can. If it is almost time for your next dose, take only that dose. Do not take double or extra doses. What may interact with this medication? Clarithromycin Cyclosporine Diltiazem Itraconazole Simvastatin Tacrolimus This list may not describe all possible interactions. Give your health care provider a list of all the medicines, herbs, non-prescription drugs, or dietary supplements you use. Also tell them if you smoke, drink alcohol, or use illegal drugs. Some  items may interact with your medicine. What should I watch for while using this medication? Visit your health care provider for regular checks on your progress. Check your blood pressure as directed. Ask your health care provider what your blood pressure should be. Also, find out when you should contact him or her. Do not treat yourself for coughs, colds, or pain while you are using this medication without asking your health care provider for advice. Some medications may increase your blood pressure. You may get drowsy or dizzy. Do not drive, use machinery, or do anything that needs mental alertness until you know how this medication affects you. Do not stand up or sit up quickly, especially if you are an older patient. This reduces the risk of dizzy or fainting spells. Alcohol can make you more drowsy and dizzy. Avoid alcoholic drinks. What side effects may I notice from receiving this medication? Side effects that you should report to your care team as soon as possible: Allergic reactions--skin rash, itching, hives, swelling of the face, lips, tongue, or throat Heart attack--pain or tightness in the chest, shoulders, arms, or jaw, nausea, shortness of breath, cold or clammy skin, feeling faint or lightheaded Low blood pressure--dizziness, feeling faint or lightheaded, blurry vision Side effects that usually do not require medical attention (report these to your care team if they continue or are bothersome): Facial flushing, redness Heart palpitations--rapid, pounding, or irregular heartbeat Nausea Stomach pain Swelling of the ankles, hands, or feet This list may not describe all possible side effects. Call your doctor for medical advice about side  effects. You may report side effects to FDA at 1-800-FDA-1088. Where should I keep my medication? Keep out of the reach of children and pets. Store at room temperature between 20 and 25 degrees C (68 and 77 degrees F). Protect from light and moisture.  Keep the container tightly closed. Get rid of any unused medication after the expiration date. To get rid of medications that are no longer needed or have expired: Take the medication to a medication take-back program. Check with your pharmacy or law enforcement to find a location. If you cannot return the medication, check the label or package insert to see if the medication should be thrown out in the garbage or flushed down the toilet. If you are not sure, ask your health care provider. If it is safe to put in the trash, empty the medication out of the container. Mix the medication with cat litter, dirt, coffee grounds, or other unwanted substance. Seal the mixture in a bag or container. Put it in the trash. NOTE: This sheet is a summary. It may not cover all possible information. If you have questions about this medicine, talk to your doctor, pharmacist, or health care provider.  2023 Elsevier/Gold Standard (2021-05-04 00:00:00)

## 2021-12-06 ENCOUNTER — Encounter: Payer: Self-pay | Admitting: Internal Medicine

## 2021-12-06 ENCOUNTER — Other Ambulatory Visit: Payer: Self-pay | Admitting: Physician Assistant

## 2021-12-06 LAB — BASIC METABOLIC PANEL
BUN/Creatinine Ratio: 21 (ref 12–28)
BUN: 16 mg/dL (ref 8–27)
CO2: 24 mmol/L (ref 20–29)
Calcium: 9.7 mg/dL (ref 8.7–10.3)
Chloride: 104 mmol/L (ref 96–106)
Creatinine, Ser: 0.77 mg/dL (ref 0.57–1.00)
Glucose: 89 mg/dL (ref 70–99)
Potassium: 4.6 mmol/L (ref 3.5–5.2)
Sodium: 143 mmol/L (ref 134–144)
eGFR: 81 mL/min/{1.73_m2} (ref >59)

## 2021-12-08 ENCOUNTER — Other Ambulatory Visit: Payer: Self-pay | Admitting: Family

## 2021-12-08 DIAGNOSIS — R062 Wheezing: Secondary | ICD-10-CM

## 2021-12-08 DIAGNOSIS — J3089 Other allergic rhinitis: Secondary | ICD-10-CM

## 2021-12-27 ENCOUNTER — Other Ambulatory Visit: Payer: Self-pay

## 2021-12-27 DIAGNOSIS — K219 Gastro-esophageal reflux disease without esophagitis: Secondary | ICD-10-CM

## 2021-12-27 DIAGNOSIS — E785 Hyperlipidemia, unspecified: Secondary | ICD-10-CM

## 2021-12-27 MED ORDER — SUCRALFATE 1 G PO TABS: 1.0000 g | ORAL_TABLET | Freq: Four times a day (QID) | ORAL | 3 refills | Status: DC | PRN

## 2021-12-27 MED ORDER — ATORVASTATIN CALCIUM 40 MG PO TABS: ORAL_TABLET | ORAL | 2 refills | Status: DC

## 2022-01-23 ENCOUNTER — Telehealth: Payer: Self-pay | Admitting: Internal Medicine

## 2022-01-23 NOTE — Telephone Encounter (Signed)
Called patient regarding upcoming October appointments, voicemail was left.

## 2022-01-25 ENCOUNTER — Other Ambulatory Visit: Payer: Self-pay | Admitting: Family

## 2022-01-25 DIAGNOSIS — R062 Wheezing: Secondary | ICD-10-CM

## 2022-01-25 DIAGNOSIS — J3089 Other allergic rhinitis: Secondary | ICD-10-CM

## 2022-01-25 NOTE — Telephone Encounter (Signed)
Requested Prescriptions  Pending Prescriptions Disp Refills  . albuterol (VENTOLIN HFA) 108 (90 Base) MCG/ACT inhaler [Pharmacy Med Name: ALBUTEROL HFA INH (200 PUFFS) 6.7GM] 6.7 g 1    Sig: INHALE 1 TO 2 PUFFS INTO THE LUNGS EVERY 6 HOURS AS NEEDED FOR WHEEZING OR SHORTNESS OF BREATH     Pulmonology:  Beta [facility]onists 2 Passed - 01/25/2022  3:44 AM      Passed - Last BP in normal range    BP Readings from Last 1 Encounters:  12/05/21 124/85         Passed - Last Heart Rate in normal range    Pulse Readings from Last 1 Encounters:  12/05/21 96         Passed - Valid encounter within last 12 months    Recent Outpatient Visits          1 month ago Essential (primary) hypertension   Primary Care at [facility], Amy J, NP   4 months ago Essential (primary) hypertension   Primary Care at [facility], Amy J, NP   7 months ago Essential hypertension   Primary Care at [facility], Amy J, NP   10 months ago Annual physical exam   Primary Care at [facility], [place], NP   1 year ago Encounter to establish care   Primary Care at [facility], Flonnie Hailstone, NP      Future Appointments            In 1 month [name], NP Primary Care at [facility]

## 2022-02-27 NOTE — Progress Notes (Signed)
Patient ID: LADINA SHUTTERS, female    DOB: 1945-11-10  MRN: [id number]  CC: Annual Physical Exam  Subjective: Christina [name] is a 76 y.o. female who presents for annual physical exam.   Her concerns today include:  - Doing well on blood pressure and cholesterol medication without issues or concerns.  - Established with Oncology for history of lung cancer and has follow-up appointments every 6 months.    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[place]) 07/06/2018   Goals of care, counseling/discussion 07/06/2018   Encounter for antineoplastic chemotherapy 07/06/2018   Primary cancer of right upper lobe of lung ([place]) 06/18/2018   Acute URI 05/25/2018   Seasonal allergies 10/06/2017   Thyromegaly 07/01/2017   Colon polyps 04/02/2017   Former smoker 09/27/2016   Smoker 09/27/2016   Cough 04/25/2016   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   Angioedema 08/04/2014   GERD (gastroesophageal reflux disease) 08/10/2013   Other and unspecified hyperlipidemia 06/23/2013   Essential hypertension 07/23/2012     Current Outpatient Medications on File Prior to Visit  Medication Sig Dispense Refill   albuterol (VENTOLIN HFA) 108 (90 Base) MCG/ACT inhaler INHALE 1 TO 2 PUFFS INTO THE LUNGS EVERY 6 HOURS AS NEEDED FOR WHEEZING OR SHORTNESS OF BREATH 6.7 g 1   amLODipine (NORVASC) 10 MG tablet Take 1 tablet (10 mg total) by mouth daily. 90 tablet 0   aspirin EC 81 MG tablet Take 81 mg by mouth daily.     fluticasone (FLONASE) 50 MCG/ACT nasal spray Place 2 sprays into both nostrils daily. 16 g 6   omeprazole (PRILOSEC) 40 MG capsule TAKE 1 CAPSULE(40 MG) BY MOUTH DAILY 90 capsule 1   potassium chloride (KLOR-CON M) 10 MEQ tablet TAKE 1 TABLET(10 MEQ) BY MOUTH TWICE DAILY 180 tablet 0   sucralfate (CARAFATE) 1 g tablet TAKE 1 TABLET(1 GRAM) BY  MOUTH FOUR TIMES DAILY AS NEEDED FOR STOMACH ACID 120 tablet 3   No current facility-administered medications on file prior to visit.    Allergies  Allergen Reactions   Coconut (Cocos Nucifera) Hives   Codeine Other (See Comments)    Upsets stomach really bad   Shrimp [Shellfish Allergy] Hives   Trandolapril Swelling    Swelling of the face/lips 07/2014    Social History   Socioeconomic History   Marital status: Married    Spouse name: [name]   Number of children: 1   Years of education: 12th grade   Highest education level: Not on file  Occupational History   Occupation: school housekeeping    Comment: part-time  Tobacco Use   Smoking status: Former    Packs/day: 0.50    Years: 50.00    Total pack years: 25.00    Types: Cigarettes    Passive exposure: Past   Smokeless tobacco: Never   Tobacco comments:    Previously quit in 2015 but has started smoking again   Vaping Use   Vaping Use: Never used  Substance and Sexual Activity   Alcohol use: No    Alcohol/week: 0.0 standard drinks of alcohol   Drug use: No   Sexual activity: Not Currently  Other Topics Concern   Not on file  Social History Narrative   Lives with her husband. Her daughter also lives in [place] with her husband and 3 children.   Social Determinants of  Health   Financial Resource Strain: Not on file  Food Insecurity: Not on file  Transportation Needs: Not on file  Physical Activity: Not on file  Stress: Not on file  Social Connections: Not on file  Intimate Partner Violence: Not on file    Family History  Problem Relation Age of Onset   Hypertension Mother    Heart disease Mother    Leukemia Father    Cancer Father    Hypertension Sister    Hypertension Sister    Colon cancer Neg Hx    Colon polyps Neg Hx    Stomach cancer Neg Hx    Esophageal cancer Neg Hx     Past Surgical History:  Procedure Laterality Date   CESAREAN SECTION     1 time   COLONOSCOPY     POLYPECTOMY      VIDEO BRONCHOSCOPY WITH ENDOBRONCHIAL NAVIGATION N/A 06/24/2018   Procedure: VIDEO BRONCHOSCOPY WITH ENDOBRONCHIAL NAVIGATION with biopsies, right upper lung lobe;  Surgeon: Melrose Nakayama, MD;  Location: [facility] OR;  Service: Thoracic;  Laterality: N/A;   VIDEO BRONCHOSCOPY WITH ENDOBRONCHIAL ULTRASOUND N/A 06/24/2018   Procedure: VIDEO BRONCHOSCOPY WITH ENDOBRONCHIAL ULTRASOUND, right upper lung lobe;  Surgeon: Melrose Nakayama, MD;  Location: [facility] OR;  Service: Thoracic;  Laterality: N/A;    ROS: Review of Systems Negative except as stated above  PHYSICAL EXAM: BP 128/88 (BP Location: Left Arm, Patient Position: Sitting, Cuff Size: Large)   Pulse 95   Temp 98.3 F (36.8 C)   Resp 16   Ht 5' 7.99" (1.727 m)   Wt 172 lb (78 kg)   SpO2 91%   BMI 26.16 kg/m   Physical Exam HENT:     Head: Normocephalic and atraumatic.     Right Ear: Tympanic membrane, ear canal and external ear normal.     Left Ear: Tympanic membrane, ear canal and external ear normal.     Nose: Nose normal.     Mouth/Throat:     Mouth: Mucous membranes are moist.     Pharynx: Oropharynx is clear.  Eyes:     Extraocular Movements: Extraocular movements intact.     Conjunctiva/sclera: Conjunctivae normal.     Pupils: Pupils are equal, round, and reactive to light.  Cardiovascular:     Rate and Rhythm: Normal rate and regular rhythm.     Pulses: Normal pulses.     Heart sounds: Normal heart sounds.  Pulmonary:     Effort: Pulmonary effort is normal.     Breath sounds: Normal breath sounds.  Chest:     Comments: Patient declined.  Abdominal:     General: Bowel sounds are normal.     Palpations: Abdomen is soft.  Genitourinary:    Comments: Patient declined.  Musculoskeletal:        General: Normal range of motion.     Right shoulder: Normal.     Left shoulder: Normal.     Right upper arm: Normal.     Left upper arm: Normal.     Right elbow: Normal.     Left elbow: Normal.     Right forearm:  Normal.     Left forearm: Normal.     Right wrist: Normal.     Left wrist: Normal.     Right hand: Normal.     Left hand: Normal.     Cervical back: Normal, normal range of motion and neck supple.     Thoracic back: Normal.  Lumbar back: Normal.     Right hip: Normal.     Left hip: Normal.     Right upper leg: Normal.     Left upper leg: Normal.     Right knee: Normal.     Left knee: Normal.     Right lower leg: Normal.     Left lower leg: Normal.     Right ankle: Normal.     Left ankle: Normal.     Right foot: Normal.     Left foot: Normal.  Skin:    General: Skin is warm and dry.     Capillary Refill: Capillary refill takes less than 2 seconds.  Neurological:     General: No focal deficit present.     Mental Status: She is alert and oriented to person, place, and time.  Psychiatric:        Mood and Affect: Mood normal.        Behavior: Behavior normal.     ASSESSMENT AND PLAN: 1. Annual physical exam - Counseled on 150 minutes of exercise per week as tolerated, healthy eating (including decreased daily intake of saturated fats, cholesterol, added sugars, sodium), STI prevention, and routine healthcare maintenance.  2. Screening for metabolic disorder - Routine screening.  - Hepatic Function Panel  3. Screening for deficiency anemia - Routine screening.  - CBC  4. Thyroid disorder screening - Routine screening.  - TSH  5. Encounter for screening mammogram for malignant neoplasm of breast - Referral for breast cancer screening by mammogram.  - MM Digital Screening; Future  6. Essential (primary) hypertension - Continue Amlodipine as prescribed. No refills needed as of present.  - Counseled on blood pressure goal of less than 130/80, low-sodium, DASH diet, medication compliance, and 150 minutes of moderate intensity exercise per week as tolerated. Counseled on medication adherence and adverse effects. - Follow-up with primary provider in 3 months or sooner if  needed.   7. Hyperlipidemia, unspecified hyperlipidemia type - Continue Atorvastatin as prescribed.  - Update lipid panel.  - Follow-up with primary provider as scheduled.  - Lipid panel - atorvastatin (LIPITOR) 40 MG tablet; TAKE 1 TABLET BY MOUTH DAILY  Dispense: 30 tablet; Refill: 2  8. Prediabetes - Routine screening.  - Hemoglobin A1c  9. Need for immunization against influenza - Administered.  - Flu Vaccine QUAD 56mo+IM (Fluarix, Fluzone & Alfiuria Quad PF)    Patient was given the opportunity to ask questions.  Patient verbalized understanding of the plan and was able to repeat key elements of the plan. Patient was given clear instructions to go to Emergency Department or return to medical center if symptoms don't improve, worsen, or new problems develop.The patient verbalized understanding.   Orders Placed This Encounter  Procedures   MM Digital Screening   Flu Vaccine QUAD 71mo+IM (Fluarix, Fluzone & Alfiuria Quad PF)   Hepatic Function Panel   CBC   Hemoglobin A1c   Lipid panel   TSH     Requested Prescriptions   Signed Prescriptions Disp Refills   atorvastatin (LIPITOR) 40 MG tablet 30 tablet 2    Sig: TAKE 1 TABLET BY MOUTH DAILY    Return in about 1 year (around 03/14/2023) for Physical per patient preference, Follow-Up or next available 3 months chronic care mgmt.  [name], NP

## 2022-03-08 ENCOUNTER — Other Ambulatory Visit: Payer: Self-pay | Admitting: Family

## 2022-03-08 DIAGNOSIS — I10 Essential (primary) hypertension: Secondary | ICD-10-CM

## 2022-03-09 ENCOUNTER — Other Ambulatory Visit: Payer: Self-pay | Admitting: Family

## 2022-03-09 DIAGNOSIS — K219 Gastro-esophageal reflux disease without esophagitis: Secondary | ICD-10-CM

## 2022-03-13 ENCOUNTER — Ambulatory Visit (INDEPENDENT_AMBULATORY_CARE_PROVIDER_SITE_OTHER): Payer: BC Managed Care – PPO | Admitting: Family

## 2022-03-13 ENCOUNTER — Encounter: Payer: Self-pay | Admitting: Family

## 2022-03-13 VITALS — BP 128/88 | HR 95 | Temp 98.3°F | Resp 16 | Ht 67.99 in | Wt 172.0 lb

## 2022-03-13 DIAGNOSIS — R7303 Prediabetes: Secondary | ICD-10-CM

## 2022-03-13 DIAGNOSIS — Z0000 Encounter for general adult medical examination without abnormal findings: Secondary | ICD-10-CM | POA: Diagnosis not present

## 2022-03-13 DIAGNOSIS — Z13228 Encounter for screening for other metabolic disorders: Secondary | ICD-10-CM

## 2022-03-13 DIAGNOSIS — Z1329 Encounter for screening for other suspected endocrine disorder: Secondary | ICD-10-CM | POA: Diagnosis not present

## 2022-03-13 DIAGNOSIS — Z1231 Encounter for screening mammogram for malignant neoplasm of breast: Secondary | ICD-10-CM

## 2022-03-13 DIAGNOSIS — E785 Hyperlipidemia, unspecified: Secondary | ICD-10-CM

## 2022-03-13 DIAGNOSIS — Z23 Encounter for immunization: Secondary | ICD-10-CM

## 2022-03-13 DIAGNOSIS — I10 Essential (primary) hypertension: Secondary | ICD-10-CM

## 2022-03-13 DIAGNOSIS — Z130 Encounter for screening for diseases of the blood and blood-forming organs and certain disorders involving the immune mechanism: Secondary | ICD-10-CM

## 2022-03-13 MED ORDER — ATORVASTATIN CALCIUM 40 MG PO TABS: ORAL_TABLET | ORAL | 2 refills | Status: DC

## 2022-03-13 NOTE — Patient Instructions (Signed)
Preventive Care 65 Years and Older, Female Preventive care refers to lifestyle choices and visits with your health care provider that can promote health and wellness. Preventive care visits are also called wellness exams. What can I expect for my preventive care visit? Counseling Your health care provider may ask you questions about your: Medical history, including: Past medical problems. Family medical history. Pregnancy and menstrual history. History of falls. Current health, including: Memory and ability to understand (cognition). Emotional well-being. Home life and relationship well-being. Sexual activity and sexual health. Lifestyle, including: Alcohol, nicotine or tobacco, and drug use. Access to firearms. Diet, exercise, and sleep habits. Work and work environment. Sunscreen use. Safety issues such as seatbelt and bike helmet use. Physical exam Your health care provider will check your: Height and weight. These may be used to calculate your BMI (body mass index). BMI is a measurement that tells if you are at a healthy weight. Waist circumference. This measures the distance around your waistline. This measurement also tells if you are at a healthy weight and may help predict your risk of certain diseases, such as type 2 diabetes and high blood pressure. Heart rate and blood pressure. Body temperature. Skin for abnormal spots. What immunizations do I need?  Vaccines are usually given at various ages, according to a schedule. Your health care provider will recommend vaccines for you based on your age, medical history, and lifestyle or other factors, such as travel or where you work. What tests do I need? Screening Your health care provider may recommend screening tests for certain conditions. This may include: Lipid and cholesterol levels. Hepatitis C test. Hepatitis B test. HIV (human immunodeficiency virus) test. STI (sexually transmitted infection) testing, if you are at  risk. Lung cancer screening. Colorectal cancer screening. Diabetes screening. This is done by checking your blood sugar (glucose) after you have not eaten for a while (fasting). Mammogram. Talk with your health care provider about how often you should have regular mammograms. BRCA-related cancer screening. This may be done if you have a family history of breast, ovarian, tubal, or peritoneal cancers. Bone density scan. This is done to screen for osteoporosis. Talk with your health care provider about your test results, treatment options, and if necessary, the need for more tests. Follow these instructions at home: Eating and drinking  Eat a diet that includes fresh fruits and vegetables, whole grains, lean protein, and low-fat dairy products. Limit your intake of foods with high amounts of sugar, saturated fats, and salt. Take vitamin and mineral supplements as recommended by your health care provider. Do not drink alcohol if your health care provider tells you not to drink. If you drink alcohol: Limit how much you have to 0-1 drink a day. Know how much alcohol is in your drink. In the [place] one drink equals one 12 oz bottle of beer (355 mL), one 5 oz glass of wine (148 mL), or one 1 oz glass of hard liquor (44 mL). Lifestyle Brush your teeth every morning and night with fluoride toothpaste. Floss one time each day. Exercise for at least 30 minutes 5 or more days each week. Do not use any products that contain nicotine or tobacco. These products include cigarettes, chewing tobacco, and vaping devices, such as e-cigarettes. If you need help quitting, ask your health care provider. Do not use drugs. If you are sexually active, practice safe sex. Use a condom or other form of protection in order to prevent STIs. Take aspirin only as told by   your health care provider. Make sure that you understand how much to take and what form to take. Work with your health care provider to find out whether it  is safe and beneficial for you to take aspirin daily. Ask your health care provider if you need to take a cholesterol-lowering medicine (statin). Find healthy ways to manage stress, such as: Meditation, yoga, or listening to music. Journaling. Talking to a trusted person. Spending time with friends and family. Minimize exposure to UV radiation to reduce your risk of skin cancer. Safety Always wear your seat belt while driving or riding in a vehicle. Do not drive: If you have been drinking alcohol. Do not ride with someone who has been drinking. When you are tired or distracted. While texting. If you have been using any mind-altering substances or drugs. Wear a helmet and other protective equipment during sports activities. If you have firearms in your house, make sure you follow all gun safety procedures. What's next? Visit your health care provider once a year for an annual wellness visit. Ask your health care provider how often you should have your eyes and teeth checked. Stay up to date on all vaccines. This information is not intended to replace advice given to you by your health care provider. Make sure you discuss any questions you have with your health care provider. Document Revised: 11/29/2020 Document Reviewed: 11/29/2020 Elsevier Patient Education  2023 Elsevier Inc.  

## 2022-03-13 NOTE — Progress Notes (Signed)
.  Pt presents for annual physical exam

## 2022-03-14 LAB — LIPID PANEL
Chol/HDL Ratio: 3.1 ratio (ref 0.0–4.4)
Cholesterol, Total: 150 mg/dL (ref 100–199)
HDL: 48 mg/dL (ref >39)
LDL Chol Calc (NIH): 83 mg/dL (ref 0–99)
Triglycerides: 104 mg/dL (ref 0–149)
VLDL Cholesterol Cal: 19 mg/dL (ref 5–40)

## 2022-03-14 LAB — CBC
Hematocrit: 42.7 % (ref 34.0–46.6)
Hemoglobin: 13.6 g/dL (ref 11.1–15.9)
MCH: 27.6 pg (ref 26.6–33.0)
MCHC: 31.9 g/dL (ref 31.5–35.7)
MCV: 87 fL (ref 79–97)
Platelets: 332 10*3/uL (ref 150–450)
RBC: 4.93 x10E6/uL (ref 3.77–5.28)
RDW: 14.9 % (ref 11.7–15.4)
WBC: 4.6 10*3/uL (ref 3.4–10.8)

## 2022-03-14 LAB — HEPATIC FUNCTION PANEL
ALT: 10 IU/L (ref 0–32)
AST: 14 IU/L (ref 0–40)
Albumin: 4.3 g/dL (ref 3.8–4.8)
Alkaline Phosphatase: 110 IU/L (ref 44–121)
Bilirubin Total: 0.4 mg/dL (ref 0.0–1.2)
Bilirubin, Direct: 0.13 mg/dL (ref 0.00–0.40)
Total Protein: 6.8 g/dL (ref 6.0–8.5)

## 2022-03-14 LAB — HEMOGLOBIN A1C
Est. average glucose Bld gHb Est-mCnc: 126 mg/dL
Hgb A1c MFr Bld: 6 % — ABNORMAL HIGH (ref 4.8–5.6)

## 2022-03-14 LAB — TSH: TSH: 1.58 u[IU]/mL (ref 0.450–4.500)

## 2022-03-15 ENCOUNTER — Other Ambulatory Visit: Payer: Self-pay | Admitting: Family

## 2022-03-15 DIAGNOSIS — J3089 Other allergic rhinitis: Secondary | ICD-10-CM

## 2022-03-15 DIAGNOSIS — R062 Wheezing: Secondary | ICD-10-CM

## 2022-03-15 NOTE — Telephone Encounter (Signed)
Requested medication (s) are due for refill today - provider review   Requested medication (s) are on the active medication list -yes  Future visit scheduled -yes  Last refill: 01/25/22 6.7g 1RF  Notes to clinic: request seems too soon for rescue inhaler- sent for provider review before refusing  Requested Prescriptions  Pending Prescriptions Disp Refills   albuterol (VENTOLIN HFA) 108 (90 Base) MCG/ACT inhaler [Pharmacy Med Name: ALBUTEROL HFA INH (200 PUFFS) 6.7GM] 6.7 g 1    Sig: INHALE 1 TO 2 PUFFS INTO THE LUNGS EVERY 6 HOURS AS NEEDED FOR WHEEZING OR SHORTNESS OF BREATH     Pulmonology:  Beta [facility]on[facility] 2 Passed - 03/15/2022  3:43 AM      Passed - Last BP in normal range    BP Readings from Last 1 Encounters:  03/13/22 128/88         Passed - Last Heart Rate in normal range    Pulse Readings from Last 1 Encounters:  03/13/22 95         Passed - Valid encounter within last 12 months    Recent Outpatient Visits           2 days ago Annual physical exam   Primary Care at [facility], [place], NP   3 months ago Essential (primary) hypertension   Primary Care at [facility], Amy J, NP   6 months ago Essential (primary) hypertension   Primary Care at [facility], Amy J, NP   9 months ago Essential hypertension   Primary Care at [facility], [place], NP   1 year ago Annual physical exam   Primary Care at [facility], Amy J, NP       Future Appointments             In 3 months [name], NP Primary Care at [facility]               Requested Prescriptions  Pending Prescriptions Disp Refills   albuterol (VENTOLIN HFA) 108 (90 Base) MCG/ACT inhaler [Pharmacy Med Name: ALBUTEROL HFA INH (200 PUFFS) 6.7GM] 6.7 g 1    Sig: INHALE 1 TO 2 PUFFS INTO THE LUNGS EVERY 6 HOURS AS NEEDED FOR WHEEZING OR SHORTNESS OF BREATH     Pulmonology:  Beta Agon[employer] 2 Passed - 03/15/2022  3:43 AM      Passed - Last BP  in normal range    BP Readings from Last 1 Encounters:  03/13/22 128/88         Passed - Last Heart Rate in normal range    Pulse Readings from Last 1 Encounters:  03/13/22 95         Passed - Valid encounter within last 12 months    Recent Outpatient Visits           2 days ago Annual physical exam   Primary Care at [facility], Amy J, NP   3 months ago Essential (primary) hypertension   Primary Care at [facility], Amy J, NP   6 months ago Essential (primary) hypertension   Primary Care at [facility], Amy J, NP   9 months ago Essential hypertension   Primary Care at [facility], [place], NP   1 year ago Annual physical exam   Primary Care at [facility], Flonnie [place], NP       Future Appointments  In 3 months [name], NP Primary Care at [facility]

## 2022-03-28 ENCOUNTER — Inpatient Hospital Stay: Payer: BC Managed Care – PPO | Attending: Internal Medicine

## 2022-03-28 DIAGNOSIS — Z08 Encounter for follow-up examination after completed treatment for malignant neoplasm: Secondary | ICD-10-CM | POA: Insufficient documentation

## 2022-03-28 DIAGNOSIS — Z85118 Personal history of other malignant neoplasm of bronchus and lung: Secondary | ICD-10-CM | POA: Diagnosis not present

## 2022-03-28 DIAGNOSIS — I2510 Atherosclerotic heart disease of native coronary artery without angina pectoris: Secondary | ICD-10-CM | POA: Insufficient documentation

## 2022-03-28 DIAGNOSIS — C3490 Malignant neoplasm of unspecified part of unspecified bronchus or lung: Secondary | ICD-10-CM

## 2022-03-28 LAB — CMP (CANCER CENTER ONLY)
ALT: 11 U/L (ref 0–44)
AST: 14 U/L — ABNORMAL LOW (ref 15–41)
Albumin: 4 g/dL (ref 3.5–5.0)
Alkaline Phosphatase: 87 U/L (ref 38–126)
Anion gap: 5 (ref 5–15)
BUN: 16 mg/dL (ref 8–23)
CO2: 29 mmol/L (ref 22–32)
Calcium: 9.4 mg/dL (ref 8.9–10.3)
Chloride: 107 mmol/L (ref 98–111)
Creatinine: 0.94 mg/dL (ref 0.44–1.00)
GFR, Estimated: >60 mL/min (ref >60)
Glucose, Bld: 100 mg/dL — ABNORMAL HIGH (ref 70–99)
Potassium: 3.5 mmol/L (ref 3.5–5.1)
Sodium: 141 mmol/L (ref 135–145)
Total Bilirubin: 0.6 mg/dL (ref 0.3–1.2)
Total Protein: 7.1 g/dL (ref 6.5–8.1)

## 2022-03-28 LAB — CBC WITH DIFFERENTIAL (CANCER CENTER ONLY)
Abs Immature Granulocytes: 0.02 10*3/uL (ref 0.00–0.07)
Basophils Absolute: 0.1 10*3/uL (ref 0.0–0.1)
Basophils Relative: 1 %
Eosinophils Absolute: 0.2 10*3/uL (ref 0.0–0.5)
Eosinophils Relative: 3 %
HCT: 41 % (ref 36.0–46.0)
Hemoglobin: 13.7 g/dL (ref 12.0–15.0)
Immature Granulocytes: 0 %
Lymphocytes Relative: 32 %
Lymphs Abs: 2 10*3/uL (ref 0.7–4.0)
MCH: 28.8 pg (ref 26.0–34.0)
MCHC: 33.4 g/dL (ref 30.0–36.0)
MCV: 86.3 fL (ref 80.0–100.0)
Monocytes Absolute: 0.5 10*3/uL (ref 0.1–1.0)
Monocytes Relative: 9 %
Neutro Abs: 3.5 10*3/uL (ref 1.7–7.7)
Neutrophils Relative %: 55 %
Platelet Count: UNDETERMINED 10*3/uL (ref 150–400)
RBC: 4.75 MIL/uL (ref 3.87–5.11)
RDW: 15.7 % — ABNORMAL HIGH (ref 11.5–15.5)
WBC Count: 6.1 10*3/uL (ref 4.0–10.5)
nRBC: 0 % (ref 0.0–0.2)

## 2022-03-29 ENCOUNTER — Other Ambulatory Visit: Payer: BC Managed Care – PPO

## 2022-03-29 ENCOUNTER — Ambulatory Visit (HOSPITAL_COMMUNITY)
Admission: RE | Admit: 2022-03-29 | Discharge: 2022-03-29 | Disposition: A | Discharge status: Home / Self Care | Payer: BC Managed Care – PPO | Source: Ambulatory Visit | Attending: Internal Medicine | Admitting: Internal Medicine

## 2022-03-29 DIAGNOSIS — C3490 Malignant neoplasm of unspecified part of unspecified bronchus or lung: Secondary | ICD-10-CM | POA: Insufficient documentation

## 2022-03-29 DIAGNOSIS — J432 Centrilobular emphysema: Secondary | ICD-10-CM | POA: Diagnosis not present

## 2022-03-29 DIAGNOSIS — J929 Pleural plaque without asbestos: Secondary | ICD-10-CM | POA: Diagnosis not present

## 2022-03-29 MED ORDER — SODIUM CHLORIDE (PF) 0.9 % IJ SOLN
INTRAMUSCULAR | Status: AC
  Filled 2022-03-29: qty 50

## 2022-03-29 MED ORDER — IOHEXOL 300 MG/ML  SOLN
75.0000 mL | Freq: Once | INTRAMUSCULAR | Status: AC | PRN
  Administered 2022-03-29: 75 mL via INTRAVENOUS

## 2022-04-02 ENCOUNTER — Inpatient Hospital Stay: Payer: BC Managed Care – PPO | Admitting: Internal Medicine

## 2022-04-02 VITALS — BP 126/82 | HR 92 | Temp 97.7°F | Resp 16 | Wt 177.0 lb

## 2022-04-02 DIAGNOSIS — C3490 Malignant neoplasm of unspecified part of unspecified bronchus or lung: Secondary | ICD-10-CM

## 2022-04-02 DIAGNOSIS — I2510 Atherosclerotic heart disease of native coronary artery without angina pectoris: Secondary | ICD-10-CM | POA: Diagnosis not present

## 2022-04-02 DIAGNOSIS — Z85118 Personal history of other malignant neoplasm of bronchus and lung: Secondary | ICD-10-CM | POA: Diagnosis not present

## 2022-04-02 DIAGNOSIS — Z08 Encounter for follow-up examination after completed treatment for malignant neoplasm: Secondary | ICD-10-CM | POA: Diagnosis not present

## 2022-04-02 NOTE — Progress Notes (Signed)
[place] [phone]   Fax:([phone]  OFFICE PROGRESS NOTE  [name], NP [address] [place] [address]  DIAGNOSIS: Stage IIIB (T4, N2, M0) non-small cell lung cancer, poorly differentiated squamous cell carcinoma.  She presented with large right upper lobe lung mass in addition to right hilar and mediastinal lymphadenopathy diagnosed in January 2020.  PRIOR THERAPY:  1) Concurrent chemoradiation with weekly carboplatin for AUC of 2 and paclitaxel 45 mg/M2.  First dose July 13, 2018.  Status post 8 cycles.  Last dose was given August 31, 2018. 2) Consolidation treatment with immunotherapy with Imfinzi 10 mg/KG every 2 weeks.  First dose starts October 13, 2018.  Status post 26 cycles.  CURRENT THERAPY: Observation.  INTERVAL HISTORY: Christina [name] 76 y.o. female returns to the clinic today for 6 months follow-up visit.  The patient is feeling fine today with no concerning complaints.  She denied having any chest pain, shortness of breath, cough or hemoptysis.  She denied having any fever or chills.  She has no nausea, vomiting, diarrhea or constipation.  She has no headache or visual changes.  She denied having any recent weight loss or night sweats.  She is here today for evaluation with repeat CT scan of the chest for restaging of her disease.   MEDICAL HISTORY: Past Medical History:  Diagnosis Date   Allergy    GERD (gastroesophageal reflux disease)    Hyperlipidemia    Hypertension    nscl ca dx'd 05/2018   lung cancer   Reflux    Tubulovillous adenoma of colon 2003    ALLERGIES:  is allergic to coconut (cocos nucifera), codeine, shrimp [shellfish allergy], and trandolapril.  MEDICATIONS:  Current Outpatient Medications  Medication Sig Dispense Refill   albuterol (VENTOLIN HFA) 108 (90 Base) MCG/ACT inhaler INHALE 1 TO 2 PUFFS INTO THE LUNGS EVERY 6 HOURS AS NEEDED FOR WHEEZING OR SHORTNESS OF BREATH 6.7 g 1    amLODipine (NORVASC) 10 MG tablet Take 1 tablet (10 mg total) by mouth daily. 90 tablet 0   aspirin EC 81 MG tablet Take 81 mg by mouth daily.     atorvastatin (LIPITOR) 40 MG tablet TAKE 1 TABLET BY MOUTH DAILY 30 tablet 2   fluticasone (FLONASE) 50 MCG/ACT nasal spray Place 2 sprays into both nostrils daily. 16 g 6   omeprazole (PRILOSEC) 40 MG capsule TAKE 1 CAPSULE(40 MG) BY MOUTH DAILY 90 capsule 1   potassium chloride (KLOR-CON M) 10 MEQ tablet TAKE 1 TABLET(10 MEQ) BY MOUTH TWICE DAILY 180 tablet 0   sucralfate (CARAFATE) 1 g tablet TAKE 1 TABLET(1 GRAM) BY MOUTH FOUR TIMES DAILY AS NEEDED FOR STOMACH ACID 120 tablet 3   No current facility-administered medications for this visit.    SURGICAL HISTORY:  Past Surgical History:  Procedure Laterality Date   CESAREAN SECTION     1 time   COLONOSCOPY     POLYPECTOMY     VIDEO BRONCHOSCOPY WITH ENDOBRONCHIAL NAVIGATION N/A 06/24/2018   Procedure: VIDEO BRONCHOSCOPY WITH ENDOBRONCHIAL NAVIGATION with biopsies, right upper lung lobe;  Surgeon: Melrose Nakayama, MD;  Location: [facility] OR;  Service: Thoracic;  Laterality: N/A;   VIDEO BRONCHOSCOPY WITH ENDOBRONCHIAL ULTRASOUND N/A 06/24/2018   Procedure: VIDEO BRONCHOSCOPY WITH ENDOBRONCHIAL ULTRASOUND, right upper lung lobe;  Surgeon: Melrose Nakayama, MD;  Location: [facility] OR;  Service: Thoracic;  Laterality: N/A;    REVIEW OF SYSTEMS:  A comprehensive review  of systems was negative.   PHYSICAL EXAMINATION: General appearance: alert, cooperative, and no distress Head: Normocephalic, without obvious abnormality, atraumatic Neck: no adenopathy, no JVD, supple, symmetrical, trachea midline, and thyroid not enlarged, symmetric, no tenderness/mass/nodules Lymph nodes: Cervical, supraclavicular, and axillary nodes normal. Resp: clear to auscultation bilaterally Back: symmetric, no curvature. ROM normal. No CVA tenderness. Cardio: regular rate and rhythm, S1, S2 normal, no murmur, click, rub  or gallop GI: soft, non-tender; bowel sounds normal; no masses,  no organomegaly Extremities: extremities normal, atraumatic, no cyanosis or edema  ECOG PERFORMANCE STATUS: 0 - Asymptomatic  Blood pressure 126/82, pulse 92, temperature 97.7 F (36.5 C), temperature source Oral, resp. rate 16, weight 177 lb (80.3 kg), SpO2 98 %.  LABORATORY DATA: Lab Results  Component Value Date   WBC 6.1 03/28/2022   HGB 13.7 03/28/2022   HCT 41.0 03/28/2022   MCV 86.3 03/28/2022   PLT PLATELET CLUMPS NOTED ON SMEAR, UNABLE TO ESTIMATE 03/28/2022      Chemistry      Component Value Date/Time   NA 141 03/28/2022 0753   NA 143 12/05/2021 1027   K 3.5 03/28/2022 0753   CL 107 03/28/2022 0753   CO2 29 03/28/2022 0753   BUN 16 03/28/2022 0753   BUN 16 12/05/2021 1027   CREATININE 0.94 03/28/2022 0753   CREATININE 0.87 03/22/2016 0908      Component Value Date/Time   CALCIUM 9.4 03/28/2022 0753   ALKPHOS 87 03/28/2022 0753   AST 14 (L) 03/28/2022 0753   ALT 11 03/28/2022 0753   BILITOT 0.6 03/28/2022 0753       RADIOGRAPHIC STUDIES: CT Chest W Contrast  Result Date: 03/31/2022 CLINICAL DATA:  76 year old female with history of non-small cell lung cancer. Staging examination. * Tracking Code: BO * EXAM: CT CHEST WITH CONTRAST TECHNIQUE: Multidetector CT imaging of the chest was performed during intravenous contrast administration. RADIATION DOSE REDUCTION: This exam was performed according to the departmental dose-optimization program which includes automated exposure control, adjustment of the mA and/or kV according to patient size and/or use of iterative reconstruction technique. CONTRAST:  61mL OMNIPAQUE IOHEXOL 300 MG/ML  SOLN COMPARISON:  Chest CT 09/29/2021. FINDINGS: Cardiovascular: Heart size is normal. Small amount of anterior pericardial fluid and/or thickening, stable compared to the prior study and unlikely to be of any hemodynamic significance at this time. No pericardial  calcification. There is aortic atherosclerosis, as well as atherosclerosis of the great vessels of the mediastinum and the coronary arteries, including calcified atherosclerotic plaque in the left main, left anterior descending, left circumflex and right coronary arteries. Mediastinum/Nodes: No pathologically enlarged mediastinal or hilar lymph nodes. Prominent amorphous soft tissue in the right hilar region measuring up to 11 mm in thickness, similar to the prior examination. Esophagus is unremarkable in appearance. No axillary lymphadenopathy. 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 No definite suspicious appearing pulmonary nodules or masses are noted on today's examination. No acute consolidative airspace disease. No pleural effusions. Mild diffuse bronchial wall thickening with mild centrilobular and paraseptal emphysema. Upper Abdomen: Atherosclerotic calcifications in the abdominal aorta. Subcentimeter low-attenuation lesion in segment 7 of the liver, too small to characterize, but similar to the prior examination and statistically likely tiny cyst (no imaging follow-up is recommended). Musculoskeletal: Chronic nondisplaced fracture of the anterolateral aspect of the right third rib  with probable fibrous union, similar to prior examinations. There are no aggressive appearing lytic or blastic lesions noted in the visualized portions of the skeleton. IMPRESSION: 1. Stable study with chronic postradiation changes in the right lung but no findings to suggest locally recurrent disease or definite metastatic disease in the thorax. 2. Aortic atherosclerosis, in addition to left main and three-vessel coronary artery disease. Assessment for potential risk factor modification, dietary therapy or  pharmacologic therapy may be warranted, if clinically indicated. 3. Mild diffuse bronchial wall thickening with mild centrilobular and paraseptal emphysema; imaging findings suggestive of underlying COPD. Aortic Atherosclerosis (ICD10-I70.0) and Emphysema (ICD10-J43.9). Electronically Signed   By: Vinnie Langton M.D.   On: 03/31/2022 10:19     ASSESSMENT AND PLAN: This is a very pleasant 76 years old African-American female recently diagnosed with a stage IIIB (T4, N2, M0) non-small cell lung cancer, poorly differentiated squamous cell carcinoma presented with right upper lobe lung mass in addition to right hilar and mediastinal lymphadenopathy diagnosed in January 2020. The patient underwent concurrent chemoradiation with weekly carboplatin and paclitaxel status post 8 cycles. She has partial response to this treatment. She also completed a course of consolidation treatment with immunotherapy with Imfinzi every 2 weeks status post 26 cycles. The patient has been on observation since that time with no concerning complaints. She had repeat CT scan of the chest performed recently.  I personally and independently reviewed the scans and discussed the result with the patient today. Her scan showed no concerning findings for disease recurrence or metastasis. I recommended for her to continue on observation with repeat CT scan of the chest in 6 months. For the coronary artery disease, she was advised to follow-up with her primary care physician and cardiology for further evaluation and management.  She was advised to call immediately if she has any other concerning symptoms in the interval. The patient voices understanding of current disease status and treatment options and is in agreement with the current care plan.  All questions were answered. The patient knows to call the clinic with any problems, questions or concerns. We can certainly see the patient much sooner if necessary.  Disclaimer: This note  was dictated with voice recognition software. Similar sounding words can inadvertently be transcribed and may not be corrected upon review.

## 2022-04-11 ENCOUNTER — Telehealth: Payer: BC Managed Care – PPO | Admitting: Family Medicine

## 2022-04-11 DIAGNOSIS — J019 Acute sinusitis, unspecified: Secondary | ICD-10-CM | POA: Diagnosis not present

## 2022-04-11 DIAGNOSIS — B9689 Other specified bacterial agents as the cause of diseases classified elsewhere: Secondary | ICD-10-CM

## 2022-04-11 MED ORDER — AMOXICILLIN-POT CLAVULANATE 875-125 MG PO TABS: 1.0000 | ORAL_TABLET | Freq: Two times a day (BID) | ORAL | 0 refills | Status: AC

## 2022-04-11 NOTE — Progress Notes (Signed)
Virtual Visit Consent   Christina [name], you are scheduled for a virtual visit with a [facility] provider today. Just as with appointments in the office, your consent must be obtained to participate. Your consent will be active for this visit and any virtual visit you may have with one of our providers in the next 365 days. If you have a MyChart account, a copy of this consent can be sent to you electronically.  As this is a virtual visit, video technology does not allow for your provider to perform a traditional examination. This may limit your provider's ability to fully assess your condition. If your provider identifies any concerns that need to be evaluated in person or the need to arrange testing (such as labs, EKG, etc.), we will make arrangements to do so. Although advances in technology are sophisticated, we cannot ensure that it will always work on either your end or our end. If the connection with a video visit is poor, the visit may have to be switched to a telephone visit. With either a video or telephone visit, we are not always able to ensure that we have a secure connection.  By engaging in this virtual visit, you consent to the provision of healthcare and authorize for your insurance to be billed (if applicable) for the services provided during this visit. Depending on your insurance coverage, you may receive a charge related to this service.  I need to obtain your verbal consent now. Are you willing to proceed with your visit today? Christina [name] has provided verbal consent on 04/11/2022 for a virtual visit (video or telephone). Perlie Mayo, NP  Date: 04/11/2022 10:47 AM  Virtual Visit via Video Note   I, Perlie Mayo, connected with  Christina [name]  ([id number], [birth year]) on 04/11/22 at 10:45 AM EDT by a video-enabled telemedicine application and verified that I am speaking with the correct person using two identifiers.  Location: Patient: Virtual Visit Location Patient:  Home Provider: Virtual Visit Location Provider: Home Office   I discussed the limitations of evaluation and management by telemedicine and the availability of in person appointments. The patient expressed understanding and agreed to proceed.    History of Present Illness: Christina [name] is a 76 y.o. who identifies as a female who was assigned female at birth, and is being seen today for Sinus infection.   HPI: Sinus Problem This is a new problem. The current episode started in the past 7 days. The problem has been rapidly worsening since onset. The maximum temperature recorded prior to her arrival was 100.4 - 100.9 F. The fever has been present for Less than 1 day. Her pain is at a severity of 3/10. The pain is mild. Associated symptoms include congestion, coughing, headaches and sinus pressure. Pertinent negatives include no chills, diaphoresis, ear pain, hoarse voice, neck pain, shortness of breath, sneezing, sore throat or swollen glands. (Granddaughter was sick recently that resulted in a sinus infection as well) Past treatments include saline nose sprays (flonase). The treatment provided no relief.    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[place]) 07/06/2018   Goals of care, counseling/discussion 07/06/2018   Encounter for antineoplastic chemotherapy 07/06/2018   Primary cancer of right upper lobe of lung ([place]) 06/18/2018   Acute URI 05/25/2018   Seasonal allergies 10/06/2017  Thyromegaly 07/01/2017   Colon polyps 04/02/2017   Former smoker 09/27/2016   Smoker 09/27/2016   Cough 04/25/2016   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   Angioedema 08/04/2014   GERD (gastroesophageal reflux disease) 08/10/2013   Other and unspecified hyperlipidemia 06/23/2013   Essential hypertension 07/23/2012    Allergies:  Allergies   Allergen Reactions   Coconut (Cocos Nucifera) Hives   Codeine Other (See Comments)    Upsets stomach really bad   Shrimp [Shellfish Allergy] Hives   Trandolapril Swelling    Swelling of the face/lips 07/2014   Medications:  Current Outpatient Medications:    albuterol (VENTOLIN HFA) 108 (90 Base) MCG/ACT inhaler, INHALE 1 TO 2 PUFFS INTO THE LUNGS EVERY 6 HOURS AS NEEDED FOR WHEEZING OR SHORTNESS OF BREATH, Disp: 6.7 g, Rfl: 1   amLODipine (NORVASC) 10 MG tablet, Take 1 tablet (10 mg total) by mouth daily., Disp: 90 tablet, Rfl: 0   aspirin EC 81 MG tablet, Take 81 mg by mouth daily., Disp: , Rfl:    atorvastatin (LIPITOR) 40 MG tablet, TAKE 1 TABLET BY MOUTH DAILY, Disp: 30 tablet, Rfl: 2   fluticasone (FLONASE) 50 MCG/ACT nasal spray, Place 2 sprays into both nostrils daily., Disp: 16 g, Rfl: 6   omeprazole (PRILOSEC) 40 MG capsule, TAKE 1 CAPSULE(40 MG) BY MOUTH DAILY, Disp: 90 capsule, Rfl: 1   potassium chloride (KLOR-CON M) 10 MEQ tablet, TAKE 1 TABLET(10 MEQ) BY MOUTH TWICE DAILY, Disp: 180 tablet, Rfl: 0   sucralfate (CARAFATE) 1 g tablet, TAKE 1 TABLET(1 GRAM) BY MOUTH FOUR TIMES DAILY AS NEEDED FOR STOMACH ACID, Disp: 120 tablet, Rfl: 3  Observations/Objective: Patient is well-developed, well-nourished in no acute distress.  Resting comfortably  at home.  Head is normocephalic, atraumatic.  No labored breathing.  Speech is clear and coherent with logical content.  Patient is alert and oriented at baseline.    Assessment and Plan: 1. Acute bacterial sinusitis  - amoxicillin-clavulanate (AUGMENTIN) 875-125 MG tablet; Take 1 tablet by mouth 2 (two) times daily for 7 days.  Dispense: 14 tablet; Refill: 0  -Take meds as prescribed -Rest -Use a cool mist humidifier especially during the winter months when heat dries out the air. - Use saline nose sprays frequently to help soothe nasal passages and promote drainage. -Saline irrigations of the nose can be very helpful if done  frequently.             * 4X daily for 1 week*             * Use of a nettie pot can be helpful with this.  *Follow directions with this* *Boiled or distilled water only -stay hydrated by drinking plenty of fluids - Keep thermostat turn down low to prevent drying out sinuses - For any cough or congestion- robitussin DM or Delsym as needed - For fever or aches or pains- take tylenol or ibuprofen as directed on bottle             * for fevers greater than 101 orally you may alternate ibuprofen and tylenol every 3 hours.  If you do not improve you will need a follow up visit in person.               Reviewed side effects, risks and benefits of medication.    Patient acknowledged agreement and understanding of the plan.   Past Medical, Surgical, Social History, Allergies, and Medications have been Reviewed.     Follow Up Instructions: I  discussed the assessment and treatment plan with the patient. The patient was provided an opportunity to ask questions and all were answered. The patient agreed with the plan and demonstrated an understanding of the instructions.  A copy of instructions were sent to the patient via MyChart unless otherwise noted below.     The patient was advised to call back or seek an in-person evaluation if the symptoms worsen or if the condition fails to improve as anticipated.  Time:  I spent 10 minutes with the patient via telehealth technology discussing the above problems/concerns.    Perlie Mayo, NP

## 2022-04-11 NOTE — Patient Instructions (Addendum)
Christina [name], thank you for joining Perlie Mayo, NP for today's virtual visit.  While this provider is not your primary care provider (PCP), if your PCP is located in our provider database this encounter information will be shared with them immediately following your visit.   [facility] account gives you access to today's visit and all your visits, tests, and labs performed at [facility] " click here if you don't have a [facility] account or go to [email or web address]  Consent: (Patient) Christina [name] provided verbal consent for this virtual visit at the beginning of the encounter.  Current Medications:  Current Outpatient Medications:    amoxicillin-clavulanate (AUGMENTIN) 875-125 MG tablet, Take 1 tablet by mouth 2 (two) times daily for 7 days., Disp: 14 tablet, Rfl: 0   albuterol (VENTOLIN HFA) 108 (90 Base) MCG/ACT inhaler, INHALE 1 TO 2 PUFFS INTO THE LUNGS EVERY 6 HOURS AS NEEDED FOR WHEEZING OR SHORTNESS OF BREATH, Disp: 6.7 g, Rfl: 1   amLODipine (NORVASC) 10 MG tablet, Take 1 tablet (10 mg total) by mouth daily., Disp: 90 tablet, Rfl: 0   aspirin EC 81 MG tablet, Take 81 mg by mouth daily., Disp: , Rfl:    atorvastatin (LIPITOR) 40 MG tablet, TAKE 1 TABLET BY MOUTH DAILY, Disp: 30 tablet, Rfl: 2   fluticasone (FLONASE) 50 MCG/ACT nasal spray, Place 2 sprays into both nostrils daily., Disp: 16 g, Rfl: 6   omeprazole (PRILOSEC) 40 MG capsule, TAKE 1 CAPSULE(40 MG) BY MOUTH DAILY, Disp: 90 capsule, Rfl: 1   potassium chloride (KLOR-CON M) 10 MEQ tablet, TAKE 1 TABLET(10 MEQ) BY MOUTH TWICE DAILY, Disp: 180 tablet, Rfl: 0   sucralfate (CARAFATE) 1 g tablet, TAKE 1 TABLET(1 GRAM) BY MOUTH FOUR TIMES DAILY AS NEEDED FOR STOMACH ACID, Disp: 120 tablet, Rfl: 3   Medications ordered in this encounter:  Meds ordered this encounter  Medications   amoxicillin-clavulanate (AUGMENTIN) 875-125 MG tablet    Sig: Take 1 tablet by mouth 2 (two) times  daily for 7 days.    Dispense:  14 tablet    Refill:  0    Order Specific Question:   Supervising Provider    Answer:   Chase Picket [id number]     *If you need refills on other medications prior to your next appointment, please contact your pharmacy*  Follow-Up: Call back or seek an in-person evaluation if the symptoms worsen or if the condition fails to improve as anticipated.  [place] [phone]  Other Instructions -Take meds as prescribed -Rest -Use a cool mist humidifier especially during the winter months when heat dries out the air. - Use saline nose sprays frequently to help soothe nasal passages and promote drainage. -Saline irrigations of the nose can be very helpful if done frequently.             * 4X daily for 1 week*             * Use of a nettie pot can be helpful with this.  *Follow directions with this* *Boiled or distilled water only -stay hydrated by drinking plenty of fluids - Keep thermostat turn down low to prevent drying out sinuses - For any cough or congestion- robitussin DM or Delsym as needed - For fever or aches or pains- take tylenol or ibuprofen as directed on bottle             * for fevers greater than 101 orally  you may alternate ibuprofen and tylenol every 3 hours.  If you do not improve you will need a follow up visit in person.                If you have been instructed to have an in-person evaluation today at a local Urgent Care facility, please use the link below. It will take you to a list of all of our available [facility], including address, phone number and hours of operation. Please do not delay care.  [facility]  If you or a family member do not have a primary care provider, use the link below to schedule a visit and establish care. When you choose a [facility] primary care physician or advanced practice provider, you gain a long-term partner in health. Find a Primary Care  Provider  Learn more about [facility]'s in-office and virtual care options: [facility]

## 2022-04-22 ENCOUNTER — Other Ambulatory Visit: Payer: Self-pay | Admitting: Family

## 2022-04-22 DIAGNOSIS — K219 Gastro-esophageal reflux disease without esophagitis: Secondary | ICD-10-CM

## 2022-04-23 NOTE — Telephone Encounter (Signed)
Requested Prescriptions  Pending Prescriptions Disp Refills   omeprazole (PRILOSEC) 40 MG capsule [Pharmacy Med Name: OMEPRAZOLE 40MG  CAPSULES] 90 capsule 0    Sig: TAKE 1 CAPSULE(40 MG) BY MOUTH DAILY     Gastroenterology: Proton Pump Inhibitors Passed - 04/22/2022  9:37 AM      Passed - Valid encounter within last 12 months    Recent Outpatient Visits           1 month ago Annual physical exam   Primary Care at [facility], [place], NP   4 months ago Essential (primary) hypertension   Primary Care at [facility], Amy J, NP   7 months ago Essential (primary) hypertension   Primary Care at [facility], Amy J, NP   10 months ago Essential hypertension   Primary Care at [facility], [place], NP   1 year ago Annual physical exam   Primary Care at [facility], Flonnie Hailstone, NP       Future Appointments             In 1 month [name], NP Primary Care at [facility]

## 2022-05-03 ENCOUNTER — Ambulatory Visit: Payer: BC Managed Care – PPO

## 2022-05-03 ENCOUNTER — Ambulatory Visit
Admission: RE | Admit: 2022-05-03 | Discharge: 2022-05-03 | Disposition: A | Discharge status: Home / Self Care | Payer: BC Managed Care – PPO | Source: Ambulatory Visit | Attending: Family | Admitting: Family

## 2022-05-03 DIAGNOSIS — Z1231 Encounter for screening mammogram for malignant neoplasm of breast: Secondary | ICD-10-CM | POA: Diagnosis not present

## 2022-05-08 ENCOUNTER — Other Ambulatory Visit: Payer: Self-pay | Admitting: Family

## 2022-05-08 DIAGNOSIS — R062 Wheezing: Secondary | ICD-10-CM

## 2022-05-08 DIAGNOSIS — J3089 Other allergic rhinitis: Secondary | ICD-10-CM

## 2022-05-08 NOTE — Telephone Encounter (Signed)
Requested Prescriptions  Pending Prescriptions Disp Refills   albuterol (VENTOLIN HFA) 108 (90 Base) MCG/ACT inhaler [Pharmacy Med Name: ALBUTEROL HFA INH (200 PUFFS) 6.7GM] 6.7 g 0    Sig: INHALE 1 TO 2 PUFFS INTO THE LUNGS EVERY 6 HOURS AS NEEDED FOR WHEEZING OR SHORTNESS OF BREATH     Pulmonology:  Beta Agonists 2 Passed - 05/08/2022  3:44 AM      Passed - Last BP in normal range    BP Readings from Last 1 Encounters:  04/02/22 126/82         Passed - Last Heart Rate in normal range    Pulse Readings from Last 1 Encounters:  04/02/22 92         Passed - Valid encounter within last 12 months    Recent Outpatient Visits           1 month ago Annual physical exam   Primary Care at [facility], Amy J, NP   5 months ago Essential (primary) hypertension   Primary Care at [facility], Amy J, NP   8 months ago Essential (primary) hypertension   Primary Care at [facility], Amy J, NP   11 months ago Essential hypertension   Primary Care at [facility], [place], NP   1 year ago Annual physical exam   Primary Care at [facility], Flonnie Hailstone, NP       Future Appointments             In 1 month [name], NP Primary Care at [facility]

## 2022-06-01 ENCOUNTER — Other Ambulatory Visit: Payer: Self-pay | Admitting: Family

## 2022-06-01 DIAGNOSIS — I10 Essential (primary) hypertension: Secondary | ICD-10-CM

## 2022-06-01 DIAGNOSIS — K219 Gastro-esophageal reflux disease without esophagitis: Secondary | ICD-10-CM

## 2022-06-13 ENCOUNTER — Other Ambulatory Visit: Payer: Self-pay | Admitting: Family

## 2022-06-13 DIAGNOSIS — R062 Wheezing: Secondary | ICD-10-CM

## 2022-06-13 DIAGNOSIS — J3089 Other allergic rhinitis: Secondary | ICD-10-CM

## 2022-06-14 NOTE — Progress Notes (Signed)
Patient ID: Christina [name], female    DOB: 19-Feb-1946  MRN: [id number]  CC: Chronic Care Management   Subjective: Christina [name] is a 76 y.o. female who presents for chronic care management.  Her concerns today include:  Doing well on chronic care medications, no issues/concerns. Denies reed flag symptoms.   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[place]) 07/06/2018   Goals of care, counseling/discussion 07/06/2018   Encounter for antineoplastic chemotherapy 07/06/2018   Primary cancer of right upper lobe of lung ([place]) 06/18/2018   Acute URI 05/25/2018   Seasonal allergies 10/06/2017   Thyromegaly 07/01/2017   Colon polyps 04/02/2017   Former smoker 09/27/2016   Smoker 09/27/2016   Cough 04/25/2016   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   Angioedema 08/04/2014   GERD (gastroesophageal reflux disease) 08/10/2013   Other and unspecified hyperlipidemia 06/23/2013   Essential hypertension 07/23/2012     Current Outpatient Medications on File Prior to Visit  Medication Sig Dispense Refill   albuterol (VENTOLIN HFA) 108 (90 Base) MCG/ACT inhaler INHALE 1 TO 2 PUFFS INTO THE LUNGS EVERY 6 HOURS AS NEEDED FOR WHEEZING OR SHORTNESS OF BREATH 6.7 g 0   aspirin EC 81 MG tablet Take 81 mg by mouth daily.     fluticasone (FLONASE) 50 MCG/ACT nasal spray Place 2 sprays into both nostrils daily. 16 g 6   omeprazole (PRILOSEC) 40 MG capsule TAKE 1 CAPSULE(40 MG) BY MOUTH DAILY 90 capsule 0   potassium chloride (KLOR-CON M) 10 MEQ tablet TAKE 1 TABLET(10 MEQ) BY MOUTH TWICE DAILY 180 tablet 0   sucralfate (CARAFATE) 1 g tablet TAKE 1 TABLET(1 GRAM) BY MOUTH FOUR TIMES DAILY AS NEEDED FOR STOMACH ACID 120 tablet 3   No current facility-administered medications on file prior to visit.    Allergies  Allergen Reactions   Coconut (Cocos  Nucifera) Hives   Codeine Other (See Comments)    Upsets stomach really bad   Shrimp [Shellfish Allergy] Hives   Trandolapril Swelling    Swelling of the face/lips 07/2014    Social History   Socioeconomic History   Marital status: Married    Spouse name: [name]   Number of children: 1   Years of education: 12th grade   Highest education level: Not on file  Occupational History   Occupation: school housekeeping    Comment: part-time  Tobacco Use   Smoking status: Former    Packs/day: 0.50    Years: 50.00    Total pack years: 25.00    Types: Cigarettes    Passive exposure: Past   Smokeless tobacco: Never   Tobacco comments:    Previously quit in 2015 but has started smoking again   Vaping Use   Vaping Use: Never used  Substance and Sexual Activity   Alcohol use: No    Alcohol/week: 0.0 standard drinks of alcohol   Drug use: No   Sexual activity: Not Currently  Other Topics Concern   Not on file  Social History Narrative   Lives with her husband. Her daughter also lives in [place] with her husband and 3 children.   Social Determinants of Health   Financial Resource Strain: Not on file  Food Insecurity: Not on file  Transportation Needs: Not on file  Physical Activity: Not on file  Stress: Not on file  Social Connections: Not on file  Intimate Partner Violence: Not on file    Family History  Problem Relation Age of Onset   Hypertension Mother    Heart disease Mother    Leukemia Father    Cancer Father    Hypertension Sister    Hypertension Sister    Colon cancer Neg Hx    Colon polyps Neg Hx    Stomach cancer Neg Hx    Esophageal cancer Neg Hx     Past Surgical History:  Procedure Laterality Date   CESAREAN SECTION     1 time   COLONOSCOPY     POLYPECTOMY     VIDEO BRONCHOSCOPY WITH ENDOBRONCHIAL NAVIGATION N/A 06/24/2018   Procedure: VIDEO BRONCHOSCOPY WITH ENDOBRONCHIAL NAVIGATION with biopsies, right upper lung lobe;  Surgeon: Melrose Nakayama, MD;  Location: [facility] OR;  Service: Thoracic;  Laterality: N/A;   VIDEO BRONCHOSCOPY WITH ENDOBRONCHIAL ULTRASOUND N/A 06/24/2018   Procedure: VIDEO BRONCHOSCOPY WITH ENDOBRONCHIAL ULTRASOUND, right upper lung lobe;  Surgeon: Melrose Nakayama, MD;  Location: [facility] OR;  Service: Thoracic;  Laterality: N/A;    ROS: Review of Systems Negative except as stated above  PHYSICAL EXAM: BP 125/84 (BP Location: Right Arm, Patient Position: Sitting, Cuff Size: Normal)   Pulse 94   Temp 98.5 F (36.9 C) (Oral)   Resp 16   Ht 5\' 8"  (1.727 m)   Wt 172 lb (78 kg)   SpO2 96%   BMI 26.15 kg/m   Physical Exam HENT:     Head: Normocephalic and atraumatic.  Eyes:     Extraocular Movements: Extraocular movements intact.     Conjunctiva/sclera: Conjunctivae normal.     Pupils: Pupils are equal, round, and reactive to light.  Cardiovascular:     Rate and Rhythm: Normal rate and regular rhythm.     Pulses: Normal pulses.     Heart sounds: Normal heart sounds.  Pulmonary:     Effort: Pulmonary effort is normal.     Breath sounds: Normal breath sounds.  Musculoskeletal:     Cervical back: Normal range of motion and neck supple.  Neurological:     General: No focal deficit present.     Mental Status: She is alert and oriented to person, place, and time.  Psychiatric:        Mood and Affect: Mood normal.        Behavior: Behavior normal.     ASSESSMENT AND PLAN: 1. Primary hypertension - Continue Amlodipine as prescribed.  - Counseled on blood pressure goal of less than 140/90, low-sodium, DASH diet, medication compliance, 150 minutes of moderate intensity exercise per week as tolerated. Discussed medication compliance, adverse effects. - Follow-up with primary provider in 3 months or sooner if needed.  - amLODipine (NORVASC) 10 MG tablet; TAKE 1 TABLET(10 MG) BY MOUTH DAILY  Dispense: 30 tablet; Refill: 2  2. Hyperlipidemia, unspecified hyperlipidemia type - Practice low-fat heart  healthy diet and at least 150 minutes of moderate intensity exercise weekly as tolerated.  - Continue Atorvastatin as prescribed.  - Follow-up with primary provider in 3 months or sooner if needed.  - atorvastatin (LIPITOR) 40 MG tablet; TAKE 1 TABLET BY MOUTH DAILY  Dispense: 30 tablet; Refill: 2    Patient was given the opportunity to ask questions.  Patient verbalized understanding of the plan and was able to repeat key elements of the plan. Patient was given clear instructions to go to Emergency Department or return to medical center if symptoms don't improve, worsen, or new  problems develop.The patient verbalized understanding.   Requested Prescriptions   Signed Prescriptions Disp Refills   amLODipine (NORVASC) 10 MG tablet 30 tablet 2    Sig: TAKE 1 TABLET(10 MG) BY MOUTH DAILY   atorvastatin (LIPITOR) 40 MG tablet 30 tablet 2    Sig: TAKE 1 TABLET BY MOUTH DAILY    Return in about 3 months (around 09/20/2022) for Follow-Up or next available chronic care mgmt .  [name], NP

## 2022-06-21 ENCOUNTER — Ambulatory Visit: Payer: Commercial Managed Care - PPO | Admitting: Family

## 2022-06-21 VITALS — BP 125/84 | HR 94 | Temp 98.5°F | Resp 16 | Ht 68.0 in | Wt 172.0 lb

## 2022-06-21 DIAGNOSIS — I10 Essential (primary) hypertension: Secondary | ICD-10-CM | POA: Diagnosis not present

## 2022-06-21 DIAGNOSIS — E785 Hyperlipidemia, unspecified: Secondary | ICD-10-CM

## 2022-06-21 MED ORDER — ATORVASTATIN CALCIUM 40 MG PO TABS: ORAL_TABLET | ORAL | 2 refills | Status: DC

## 2022-06-21 MED ORDER — AMLODIPINE BESYLATE 10 MG PO TABS: ORAL_TABLET | ORAL | 2 refills | Status: DC

## 2022-06-29 ENCOUNTER — Telehealth: Payer: Commercial Managed Care - PPO | Admitting: Family Medicine

## 2022-06-29 DIAGNOSIS — J069 Acute upper respiratory infection, unspecified: Secondary | ICD-10-CM

## 2022-06-29 MED ORDER — AZITHROMYCIN 250 MG PO TABS: ORAL_TABLET | ORAL | 0 refills | Status: AC

## 2022-06-29 MED ORDER — PREDNISONE 20 MG PO TABS: 20.0000 mg | ORAL_TABLET | Freq: Two times a day (BID) | ORAL | 0 refills | Status: AC

## 2022-06-29 MED ORDER — BENZONATATE 200 MG PO CAPS: 200.0000 mg | ORAL_CAPSULE | Freq: Two times a day (BID) | ORAL | 0 refills | Status: DC | PRN

## 2022-06-29 NOTE — Patient Instructions (Signed)

## 2022-06-29 NOTE — Progress Notes (Signed)
Virtual Visit Consent   Christina [name], you are scheduled for a virtual visit with a [facility] provider today. Just as with appointments in the office, your consent must be obtained to participate. Your consent will be active for this visit and any virtual visit you may have with one of our providers in the next 365 days. If you have a MyChart account, a copy of this consent can be sent to you electronically.  As this is a virtual visit, video technology does not allow for your provider to perform a traditional examination. This may limit your provider's ability to fully assess your condition. If your provider identifies any concerns that need to be evaluated in person or the need to arrange testing (such as labs, EKG, etc.), we will make arrangements to do so. Although advances in technology are sophisticated, we cannot ensure that it will always work on either your end or our end. If the connection with a video visit is poor, the visit may have to be switched to a telephone visit. With either a video or telephone visit, we are not always able to ensure that we have a secure connection.  By engaging in this virtual visit, you consent to the provision of healthcare and authorize for your insurance to be billed (if applicable) for the services provided during this visit. Depending on your insurance coverage, you may receive a charge related to this service.  I need to obtain your verbal consent now. Are you willing to proceed with your visit today? MERIBETH VITUG has provided verbal consent on 06/29/2022 for a virtual visit (video or telephone). Georgana Curio, FNP  Date: 06/29/2022 9:48 AM  Virtual Visit via Video Note   I, Georgana Curio, connected with  SAISHA HOGUE  ([id number], Jan 10, 1946) on 06/29/22 at  9:45 AM EST by a video-enabled telemedicine application and verified that I am speaking with the correct person using two identifiers.  Location: Patient: Virtual Visit Location Patient:  Home Provider: Virtual Visit Location Provider: Home Office   I discussed the limitations of evaluation and management by telemedicine and the availability of in person appointments. The patient expressed understanding and agreed to proceed.    History of Present Illness: Christina [name] is a 77 y.o. who identifies as a female who was assigned female at birth, and is being seen today for head and chest congestion, wheezing, prod cough, taking mucinex, sx for almost a week, fever, no covid tests and no exposure she is aware of. She denies loss of taste or smell. 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   Angioedema 08/04/2014   GERD (gastroesophageal reflux disease) 08/10/2013   Other and unspecified hyperlipidemia 06/23/2013   Essential hypertension 07/23/2012    Allergies:  Allergies  Allergen Reactions   Coconut (Cocos Nucifera) Hives   Codeine Other (See Comments)    Upsets stomach really bad   Shrimp [Shellfish Allergy] Hives  Trandolapril Swelling    Swelling of the face/lips 07/2014   Medications:  Current Outpatient Medications:    azithromycin (ZITHROMAX) 250 MG tablet, Take 2 tablets on day 1, then 1 tablet daily on days 2 through 5, Disp: 6 tablet, Rfl: 0   benzonatate (TESSALON) 200 MG capsule, Take 1 capsule (200 mg total) by mouth 2 (two) times daily as needed for cough., Disp: 20 capsule, Rfl: 0    predniSONE (DELTASONE) 20 MG tablet, Take 1 tablet (20 mg total) by mouth 2 (two) times daily with a meal for 5 days., Disp: 10 tablet, Rfl: 0   albuterol (VENTOLIN HFA) 108 (90 Base) MCG/ACT inhaler, INHALE 1 TO 2 PUFFS INTO THE LUNGS EVERY 6 HOURS AS NEEDED FOR WHEEZING OR SHORTNESS OF BREATH, Disp: 6.7 g, Rfl: 0   amLODipine (NORVASC) 10 MG tablet, TAKE 1 TABLET(10 MG) BY MOUTH DAILY, Disp: 30 tablet, Rfl: 2   aspirin EC 81 MG tablet, Take 81 mg by mouth daily., Disp: , Rfl:    atorvastatin (LIPITOR) 40 MG tablet, TAKE 1 TABLET BY MOUTH DAILY, Disp: 30 tablet, Rfl: 2   fluticasone (FLONASE) 50 MCG/ACT nasal spray, Place 2 sprays into both nostrils daily., Disp: 16 g, Rfl: 6   omeprazole (PRILOSEC) 40 MG capsule, TAKE 1 CAPSULE(40 MG) BY MOUTH DAILY, Disp: 90 capsule, Rfl: 0   potassium chloride (KLOR-CON M) 10 MEQ tablet, TAKE 1 TABLET(10 MEQ) BY MOUTH TWICE DAILY, Disp: 180 tablet, Rfl: 0   sucralfate (CARAFATE) 1 g tablet, TAKE 1 TABLET(1 GRAM) BY MOUTH FOUR TIMES DAILY AS NEEDED FOR STOMACH ACID, Disp: 120 tablet, Rfl: 3  Observations/Objective: Patient is well-developed, well-nourished in no acute distress.  Resting comfortably  at home.  Head is normocephalic, atraumatic.  No labored breathing.  Speech is clear and coherent with logical content.  Patient is alert and oriented at baseline.    Assessment and Plan: 1. Upper respiratory tract infection, unspecified type  Increase fluids, humidifier at night, tylenol, urgent care if sx persist or worsen, continue mucinrx.   Follow Up Instructions: I discussed the assessment and treatment plan with the patient. The patient was provided an opportunity to ask questions and all were answered. The patient agreed with the plan and demonstrated an understanding of the instructions.  A copy of instructions were sent to the patient via MyChart unless otherwise noted below.     The patient was advised to call back or seek an in-person  evaluation if the symptoms worsen or if the condition fails to improve as anticipated.  Time:  I spent 10 minutes with the patient via telehealth technology discussing the above problems/concerns.    Georgana Curio, FNP

## 2022-07-01 ENCOUNTER — Other Ambulatory Visit: Payer: Self-pay | Admitting: Family

## 2022-07-01 DIAGNOSIS — J3089 Other allergic rhinitis: Secondary | ICD-10-CM

## 2022-07-01 DIAGNOSIS — R062 Wheezing: Secondary | ICD-10-CM

## 2022-07-08 ENCOUNTER — Other Ambulatory Visit: Payer: Self-pay | Admitting: Family

## 2022-07-08 DIAGNOSIS — K219 Gastro-esophageal reflux disease without esophagitis: Secondary | ICD-10-CM

## 2022-07-09 NOTE — Telephone Encounter (Signed)
Requested Prescriptions  Pending Prescriptions Disp Refills   omeprazole (PRILOSEC) 40 MG capsule [Pharmacy Med Name: OMEPRAZOLE 40MG  CAPSULES] 90 capsule 0    Sig: TAKE 1 CAPSULE(40 MG) BY MOUTH DAILY     Gastroenterology: Proton Pump Inhibitors Passed - 07/08/2022  9:19 AM      Passed - Valid encounter within last 12 months    Recent Outpatient Visits           2 weeks ago Primary hypertension   [facility], NP   3 months ago Annual physical exam   [facility] [facility], Amy J, NP   7 months ago Essential (primary) hypertension   [place] [facility], Amy J, NP   10 months ago Essential (primary) hypertension   [place] [facility], NP   1 year ago Essential hypertension   [place] [facility], NP       Future Appointments             In 2 months [place], NP [facility]

## 2022-07-13 ENCOUNTER — Telehealth: Payer: Commercial Managed Care - PPO | Admitting: Family Medicine

## 2022-07-13 DIAGNOSIS — J069 Acute upper respiratory infection, unspecified: Secondary | ICD-10-CM | POA: Diagnosis not present

## 2022-07-13 MED ORDER — BENZONATATE 200 MG PO CAPS: 200.0000 mg | ORAL_CAPSULE | Freq: Two times a day (BID) | ORAL | 0 refills | Status: DC | PRN

## 2022-07-13 MED ORDER — PREDNISONE 10 MG PO TABS: 10.0000 mg | ORAL_TABLET | Freq: Two times a day (BID) | ORAL | 0 refills | Status: AC

## 2022-07-13 NOTE — Progress Notes (Signed)
We are sorry that you are not feeling well.  Here is how we plan to help!  Based on your presentation I believe you most likely have A cough due to a virus.  This is called viral bronchitis and is best treated by rest, plenty of fluids and control of the cough.  You may use Ibuprofen or Tylenol as directed to help your symptoms.     In addition you may use A prescription cough medication called Tessalon Perles 100mg . You may take 1-2 capsules every 8 hours as needed for your cough.  Prednisone 5 mg daily for 6 days (see taper instructions below)  From your responses in the eVisit questionnaire you describe inflammation in the upper respiratory tract which is causing a significant cough.  This is commonly called Bronchitis and has four common causes:   Allergies Viral Infections Acid Reflux Bacterial Infection Allergies, viruses and acid reflux are treated by controlling symptoms or eliminating the cause. An example might be a cough caused by taking certain blood pressure medications. You stop the cough by changing the medication. Another example might be a cough caused by acid reflux. Controlling the reflux helps control the cough.  USE OF BRONCHODILATOR ("RESCUE") INHALERS: There is a risk from using your bronchodilator too frequently.  The risk is that over-reliance on a medication which only relaxes the muscles surrounding the breathing tubes can reduce the effectiveness of medications prescribed to reduce swelling and congestion of the tubes themselves.  Although you feel brief relief from the bronchodilator inhaler, your asthma may actually be worsening with the tubes becoming more swollen and filled with mucus.  This can delay other crucial treatments, such as oral steroid medications. If you need to use a bronchodilator inhaler daily, several times per day, you should discuss this with your provider.  There are probably better treatments that could be used to keep your asthma under control.      HOME CARE Only take medications as instructed by your medical team. Complete the entire course of an antibiotic. Drink plenty of fluids and get plenty of rest. Avoid close contacts especially the very young and the elderly Cover your mouth if you cough or cough into your sleeve. Always remember to wash your hands A steam or ultrasonic humidifier can help congestion.   GET HELP RIGHT AWAY IF: You develop worsening fever. You become short of breath You cough up blood. Your symptoms persist after you have completed your treatment plan MAKE SURE YOU  Understand these instructions. Will watch your condition. Will get help right away if you are not doing well or get worse.    Thank you for choosing an e-visit.  Your e-visit answers were reviewed by a board certified advanced clinical practitioner to complete your personal care plan. Depending upon the condition, your plan could have included both over the counter or prescription medications.  Please review your pharmacy choice. Make sure the pharmacy is open so you can pick up prescription now. If there is a problem, you may contact your provider through [employer] and have the prescription routed to another pharmacy.  Your safety is important to [place]. If you have drug allergies check your prescription carefully.   For the next 24 hours you can use MyChart to ask questions about today's visit, request a non-urgent call back, or ask for a work or school excuse. You will get an email in the next two days asking about your experience. I hope that your e-visit has been  valuable and will speed your recovery.    have provided 5 minutes of non face to face time during this encounter for chart review and documentation.

## 2022-07-16 ENCOUNTER — Emergency Department (HOSPITAL_BASED_OUTPATIENT_CLINIC_OR_DEPARTMENT_OTHER): Payer: Commercial Managed Care - PPO

## 2022-07-16 ENCOUNTER — Encounter (HOSPITAL_BASED_OUTPATIENT_CLINIC_OR_DEPARTMENT_OTHER): Payer: Self-pay | Admitting: Emergency Medicine

## 2022-07-16 ENCOUNTER — Other Ambulatory Visit: Payer: Self-pay

## 2022-07-16 ENCOUNTER — Emergency Department (HOSPITAL_BASED_OUTPATIENT_CLINIC_OR_DEPARTMENT_OTHER)
Admission: EM | Admit: 2022-07-16 | Discharge: 2022-07-16 | Disposition: A | Discharge status: Home / Self Care | Payer: Commercial Managed Care - PPO | Attending: Emergency Medicine | Admitting: Emergency Medicine

## 2022-07-16 DIAGNOSIS — Z7982 Long term (current) use of aspirin: Secondary | ICD-10-CM | POA: Insufficient documentation

## 2022-07-16 DIAGNOSIS — Z20822 Contact with and (suspected) exposure to covid-19: Secondary | ICD-10-CM | POA: Diagnosis not present

## 2022-07-16 DIAGNOSIS — Z79899 Other long term (current) drug therapy: Secondary | ICD-10-CM | POA: Insufficient documentation

## 2022-07-16 DIAGNOSIS — I10 Essential (primary) hypertension: Secondary | ICD-10-CM | POA: Insufficient documentation

## 2022-07-16 DIAGNOSIS — R000 Tachycardia, unspecified: Secondary | ICD-10-CM | POA: Insufficient documentation

## 2022-07-16 DIAGNOSIS — Z87891 Personal history of nicotine dependence: Secondary | ICD-10-CM | POA: Diagnosis not present

## 2022-07-16 DIAGNOSIS — Z85118 Personal history of other malignant neoplasm of bronchus and lung: Secondary | ICD-10-CM | POA: Diagnosis not present

## 2022-07-16 DIAGNOSIS — J069 Acute upper respiratory infection, unspecified: Secondary | ICD-10-CM | POA: Diagnosis not present

## 2022-07-16 DIAGNOSIS — B9789 Other viral agents as the cause of diseases classified elsewhere: Secondary | ICD-10-CM | POA: Diagnosis not present

## 2022-07-16 DIAGNOSIS — R0602 Shortness of breath: Secondary | ICD-10-CM | POA: Diagnosis present

## 2022-07-16 LAB — RESP PANEL BY RT-PCR (RSV, FLU A&B, COVID)  RVPGX2
Influenza A by PCR: NEGATIVE
Influenza B by PCR: NEGATIVE
Resp Syncytial Virus by PCR: NEGATIVE
SARS Coronavirus 2 by RT PCR: NEGATIVE

## 2022-07-16 LAB — BASIC METABOLIC PANEL
Anion gap: 8 (ref 5–15)
BUN: 15 mg/dL (ref 8–23)
CO2: 25 mmol/L (ref 22–32)
Calcium: 8.7 mg/dL — ABNORMAL LOW (ref 8.9–10.3)
Chloride: 104 mmol/L (ref 98–111)
Creatinine, Ser: 0.94 mg/dL (ref 0.44–1.00)
GFR, Estimated: >60 mL/min (ref >60)
Glucose, Bld: 99 mg/dL (ref 70–99)
Potassium: 3.5 mmol/L (ref 3.5–5.1)
Sodium: 137 mmol/L (ref 135–145)

## 2022-07-16 LAB — CBC
HCT: 38 % (ref 36.0–46.0)
Hemoglobin: 12.8 g/dL (ref 12.0–15.0)
MCH: 28.8 pg (ref 26.0–34.0)
MCHC: 33.7 g/dL (ref 30.0–36.0)
MCV: 85.6 fL (ref 80.0–100.0)
Platelets: 332 10*3/uL (ref 150–400)
RBC: 4.44 MIL/uL (ref 3.87–5.11)
RDW: 15.4 % (ref 11.5–15.5)
WBC: 7.6 10*3/uL (ref 4.0–10.5)
nRBC: 0 % (ref 0.0–0.2)

## 2022-07-16 MED ORDER — ONDANSETRON HCL 4 MG PO TABS: 4.0000 mg | ORAL_TABLET | Freq: Four times a day (QID) | ORAL | 0 refills | Status: DC

## 2022-07-16 MED ORDER — ONDANSETRON 4 MG PO TBDP
4.0000 mg | ORAL_TABLET | Freq: Once | ORAL | Status: AC
  Administered 2022-07-16: 4 mg via ORAL
  Filled 2022-07-16: qty 1

## 2022-07-16 MED ORDER — ALBUTEROL SULFATE HFA 108 (90 BASE) MCG/ACT IN AERS
1.0000 | INHALATION_SPRAY | Freq: Once | RESPIRATORY_TRACT | Status: AC
  Administered 2022-07-16: 1 via RESPIRATORY_TRACT
  Filled 2022-07-16: qty 6.7

## 2022-07-16 NOTE — Discharge Instructions (Addendum)
Please follow-up with your PCP, and monitor symptoms for any worsening, if you have significant worsening shortness of breath, develop chest pain, vomiting does not respond to nausea medication recommend return to the emergency department for further evaluation, otherwise you can use nausea medication as needed at home, drink plenty of fluids, and rest, your symptoms are likely to improve in a few days if this is a viral illness.

## 2022-07-16 NOTE — ED Triage Notes (Signed)
Feeling bad weak and sob x 1 , has also had a cough and h/a

## 2022-07-16 NOTE — ED Provider Notes (Signed)
[facility] Provider Note   CSN: [id number] Arrival date & time: 07/16/22  6546     History  Chief Complaint  Patient presents with   Weakness    Christina [name] is a 77 y.o. 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  Patient reports that her husband was sick with a cough around her birthday, and she began feeling sick shortly thereafter.  She denies significant worsening of shortness of breath with exertion, she denies any chest pain, hemoptysis, leg swelling, recent travel.  She has had no previous history of any blood clots.  She reports Tmax of 100.9 at home, she reports that she has gotten some increased temperature normally around nighttime.   Weakness      Home Medications Prior to Admission medications   Medication Sig Start Date End Date Taking? Authorizing Provider  ondansetron (ZOFRAN) 4 MG tablet Take 1 tablet (4 mg total) by mouth every 6 (six) hours. 07/16/22  Yes Kylea Berrong H, PA-C  albuterol (VENTOLIN HFA) 108 (90 Base) MCG/ACT inhaler INHALE 1 TO 2 PUFFS INTO THE LUNGS EVERY 6 HOURS AS NEEDED FOR WHEEZING OR SHORTNESS OF BREATH 07/01/22   [name], NP  amLODipine (NORVASC) 10 MG tablet TAKE 1 TABLET(10 MG) BY MOUTH DAILY 06/21/22   [name], NP  aspirin EC 81 MG tablet Take 81 mg by mouth daily.    [provider]  atorvastatin (LIPITOR) 40 MG tablet TAKE 1 TABLET BY MOUTH DAILY 06/21/22   [name], NP  benzonatate (TESSALON) 200 MG capsule Take 1 capsule (200 mg total) by mouth 2 (two) times daily as needed for cough. 06/29/22   Tempie Hoist, FNP  benzonatate (TESSALON) 200 MG capsule Take 1 capsule (200 mg total) by mouth 2 (two)  times daily as needed for cough. 07/13/22   Tempie Hoist, FNP  fluticasone (FLONASE) 50 MCG/ACT nasal spray Place 2 sprays into both nostrils daily. 05/28/21   Carvel Getting, NP  omeprazole (PRILOSEC) 40 MG capsule TAKE 1 CAPSULE(40 MG) BY MOUTH DAILY 07/09/22   [name], NP  potassium chloride (KLOR-CON M) 10 MEQ tablet TAKE 1 TABLET(10 MEQ) BY MOUTH TWICE DAILY 06/03/22   [name], NP  predniSONE (DELTASONE) 10 MG tablet Take 1 tablet (10 mg total) by mouth 2 (two) times daily with a meal for 5 days. 07/13/22 07/18/22  Tempie Hoist, FNP  sucralfate (CARAFATE) 1 g tablet TAKE 1 TABLET(1 GRAM) BY MOUTH FOUR TIMES DAILY AS NEEDED FOR STOMACH ACID 06/03/22   [name], NP      Allergies    Coconut (cocos nucifera), Codeine, Shrimp [shellfish allergy], and Trandolapril    Review of Systems   Review of Systems  Neurological:  Positive for weakness.  All other systems reviewed and are negative.   Physical Exam Updated Vital Signs BP 109/83   Pulse 98   Temp 97.8 F (36.6 C) (Oral)   Resp (!) 21   Ht 5\' 8"  (1.727 m)   Wt 78.5 kg   SpO2 94%   BMI 26.30 kg/m  Physical Exam Vitals and nursing note reviewed.  Constitutional:      General: She is not in acute distress.  Appearance: Normal appearance.  HENT:     Head: Normocephalic and atraumatic.  Eyes:     General:        Right eye: No discharge.        Left eye: No discharge.  Cardiovascular:     Rate and Rhythm: Regular rhythm. Tachycardia present.     Heart sounds: No murmur heard.    No friction rub. No gallop.  Pulmonary:     Effort: Pulmonary effort is normal.     Breath sounds: Normal breath sounds.     Comments: Mild rhonchi, no wheezing, stridor, rales. No respiratory distress. Abdominal:     General: Bowel sounds are normal.     Palpations: Abdomen is soft.  Skin:    General: Skin is warm and dry.     Capillary Refill: Capillary refill takes less than 2 seconds.  Neurological:     Mental  Status: She is alert and oriented to person, place, and time.  Psychiatric:        Mood and Affect: Mood normal.        Behavior: Behavior normal.     ED Results / Procedures / Treatments   Labs (all labs ordered are listed, but only abnormal results are displayed) Labs Reviewed  BASIC METABOLIC PANEL - Abnormal; Notable for the following components:      Result Value   Calcium 8.7 (*)    All other components within normal limits  RESP PANEL BY RT-PCR (RSV, FLU A&B, COVID)  [id number]  CBC    EKG EKG Interpretation  Date/Time:  Tuesday July 16 2022 09:40:12 EST Ventricular Rate:  98 PR Interval:  117 QRS Duration: 92 QT Interval:  351 QTC Calculation: 449 R Axis:   80 Text Interpretation: Sinus rhythm Confirmed by Cindee Lame [phone]) on 07/16/2022 10:54:34 AM  Radiology DG Chest 2 View  Result Date: 07/16/2022 CLINICAL DATA:  Cough, congestion, fatigue, shortness of breath, past history of small cell lung cancer, hypertension, prior chemotherapy and radiation therapy EXAM: CHEST - 2 VIEW COMPARISON:  06/24/2018 chest radiograph; CT chest 03/29/2022 FINDINGS: Normal heart size, mediastinal contours, and pulmonary vascularity. Atherosclerotic calcification aorta. Mild volume loss in RIGHT hemithorax with slight mediastinal shift to RIGHT. Emphysematous changes with scarring and volume loss in LEFT upper lobe. Persistent RIGHT perihilar density corresponding to post therapy changes on chest CT. Remaining lungs clear without pulmonary infiltrate, pleural effusion, or pneumothorax. Bones demineralized. IMPRESSION: Chronic post therapy changes from treated lung cancer in RIGHT upper lobe. Aortic Atherosclerosis (ICD10-I70.0) and Emphysema (ICD10-J43.9). Electronically Signed   By: Lavonia Dana M.D.   On: 07/16/2022 10:07    Procedures Procedures    Medications Ordered in ED Medications  albuterol (VENTOLIN HFA) 108 (90 Base) MCG/ACT inhaler 1 puff (1 puff Inhalation Given 07/16/22  1012)  ondansetron (ZOFRAN-ODT) disintegrating tablet 4 mg (4 mg Oral Given 07/16/22 1135)    ED Course/ Medical Decision Making/ A&P                             Medical Decision Making Amount and/or Complexity of Data Reviewed Labs: ordered. Radiology: ordered.  Risk Prescription drug management.   This patient is a 77 y.o. female who presents to the ED for concern of cough, general weakness, intermittent shortness, for around one week. Hx of right upper lobe SCLC hx of immunotherapy.   Differential diagnoses prior to evaluation: COVID, flu, RSV, acute bronchitis, bronchitis, COPD, asthma exacerbation,  CHF exacerbation, PE, pneumonia  Past Medical History / Social History / Additional history: Chart reviewed. Pertinent results include: Hypertension, acid reflux, previous smoker, small cancer of the right lung status post immunotherapy  Physical Exam: Physical exam performed. The pertinent findings include: Patient does have some occasional dry cough, she has some upper respiratory infectious symptoms, scattered rhonchi in the upper lung fields, no wheezing, rales, focal consolidation, or respiratory distress.  She has some brief tachycardia which resolves with rest on the bed.  She is not having any active hemoptysis.  She has no leg swelling.  Medications / Treatment: Zofran administered for nausea that developed while in the emergency department, albuterol for cough and mild shortness of breath.  RVP negative for COVID, flu, RSV, CBC unremarkable, BMP unremarkable.  I dependently interpreted chest x-ray which shows some changes related to her small cell lung cancer and immunotherapy, with no evidence of metastasis, acute pneumonia, or other intrathoracic abnormality.   Disposition: After consideration of the diagnostic results and the patients response to treatment, I feel that patient's symptoms are consistent likely with an upper respiratory infection, we performed shared decision  making to discuss additional evaluation with D-dimer, possible CT to rule out pulmonary embolism especially in context of previous cancer and mild tachycardia on arrival, however after discussion with patient, and her clinical presentation today I think that is reasonable to treat presumptively as an upper respiratory infection with close PCP follow-up.  Encourage plenty of fluids, rest, over-the-counter cough and cold medication, discussed extensive return precautions.Marland Kitchen   emergency department workup does not suggest an emergent condition requiring admission or immediate intervention beyond what has been performed at this time. The plan is: as above. The patient is safe for discharge and has been instructed to return immediately for worsening symptoms, change in symptoms or any other concerns.  I discussed this case with my attending physician who cosigned this note including patient's presenting symptoms, physical exam, and planned diagnostics and interventions. Attending physician stated agreement with plan or made changes to plan which were implemented.   Attending physician assessed patient at bedside.  Final Clinical Impression(s) / ED Diagnoses Final diagnoses:  Viral upper respiratory tract infection    Rx / DC Orders ED Discharge Orders          Ordered    ondansetron (ZOFRAN) 4 MG tablet  Every 6 hours        07/16/22 1119              Haja Crego, [place] H, PA-C 07/16/22 1137    Audley Hose, MD 07/16/22 1655

## 2022-07-20 ENCOUNTER — Encounter (HOSPITAL_COMMUNITY): Payer: Self-pay

## 2022-07-20 ENCOUNTER — Other Ambulatory Visit: Payer: Self-pay

## 2022-07-20 ENCOUNTER — Emergency Department (HOSPITAL_COMMUNITY): Payer: Commercial Managed Care - PPO

## 2022-07-20 ENCOUNTER — Emergency Department (HOSPITAL_COMMUNITY)
Admission: EM | Admit: 2022-07-20 | Discharge: 2022-07-21 | Disposition: A | Discharge status: Home / Self Care | Payer: Commercial Managed Care - PPO | Attending: Emergency Medicine | Admitting: Emergency Medicine

## 2022-07-20 DIAGNOSIS — I10 Essential (primary) hypertension: Secondary | ICD-10-CM | POA: Diagnosis not present

## 2022-07-20 DIAGNOSIS — R062 Wheezing: Secondary | ICD-10-CM | POA: Insufficient documentation

## 2022-07-20 DIAGNOSIS — R531 Weakness: Secondary | ICD-10-CM | POA: Diagnosis not present

## 2022-07-20 DIAGNOSIS — Z7982 Long term (current) use of aspirin: Secondary | ICD-10-CM | POA: Insufficient documentation

## 2022-07-20 DIAGNOSIS — Z85118 Personal history of other malignant neoplasm of bronchus and lung: Secondary | ICD-10-CM | POA: Insufficient documentation

## 2022-07-20 DIAGNOSIS — Z1152 Encounter for screening for COVID-19: Secondary | ICD-10-CM | POA: Diagnosis not present

## 2022-07-20 DIAGNOSIS — Z79899 Other long term (current) drug therapy: Secondary | ICD-10-CM | POA: Insufficient documentation

## 2022-07-20 LAB — CBC WITH DIFFERENTIAL/PLATELET
Abs Immature Granulocytes: 0.05 10*3/uL (ref 0.00–0.07)
Basophils Absolute: 0.1 10*3/uL (ref 0.0–0.1)
Basophils Relative: 1 %
Eosinophils Absolute: 0.2 10*3/uL (ref 0.0–0.5)
Eosinophils Relative: 4 %
HCT: 35.7 % — ABNORMAL LOW (ref 36.0–46.0)
Hemoglobin: 11.5 g/dL — ABNORMAL LOW (ref 12.0–15.0)
Immature Granulocytes: 1 %
Lymphocytes Relative: 24 %
Lymphs Abs: 1.7 10*3/uL (ref 0.7–4.0)
MCH: 28.9 pg (ref 26.0–34.0)
MCHC: 32.2 g/dL (ref 30.0–36.0)
MCV: 89.7 fL (ref 80.0–100.0)
Monocytes Absolute: 0.7 10*3/uL (ref 0.1–1.0)
Monocytes Relative: 11 %
Neutro Abs: 4.1 10*3/uL (ref 1.7–7.7)
Neutrophils Relative %: 59 %
Platelets: 397 10*3/uL (ref 150–400)
RBC: 3.98 MIL/uL (ref 3.87–5.11)
RDW: 15.4 % (ref 11.5–15.5)
WBC: 6.9 10*3/uL (ref 4.0–10.5)
nRBC: 0 % (ref 0.0–0.2)

## 2022-07-20 LAB — RESP PANEL BY RT-PCR (RSV, FLU A&B, COVID)  RVPGX2
Influenza A by PCR: NEGATIVE
Influenza B by PCR: NEGATIVE
Resp Syncytial Virus by PCR: NEGATIVE
SARS Coronavirus 2 by RT PCR: NEGATIVE

## 2022-07-20 LAB — BASIC METABOLIC PANEL
Anion gap: 9 (ref 5–15)
BUN: 13 mg/dL (ref 8–23)
CO2: 24 mmol/L (ref 22–32)
Calcium: 8.6 mg/dL — ABNORMAL LOW (ref 8.9–10.3)
Chloride: 105 mmol/L (ref 98–111)
Creatinine, Ser: 0.98 mg/dL (ref 0.44–1.00)
GFR, Estimated: >60 mL/min (ref >60)
Glucose, Bld: 92 mg/dL (ref 70–99)
Potassium: 3.7 mmol/L (ref 3.5–5.1)
Sodium: 138 mmol/L (ref 135–145)

## 2022-07-20 LAB — MAGNESIUM: Magnesium: 1.7 mg/dL (ref 1.7–2.4)

## 2022-07-20 MED ORDER — ALBUTEROL SULFATE (2.5 MG/3ML) 0.083% IN NEBU
10.0000 mg/h | INHALATION_SOLUTION | RESPIRATORY_TRACT | Status: DC
  Administered 2022-07-20: 10 mg/h via RESPIRATORY_TRACT

## 2022-07-20 MED ORDER — IPRATROPIUM-ALBUTEROL 0.5-2.5 (3) MG/3ML IN SOLN
3.0000 mL | Freq: Once | RESPIRATORY_TRACT | Status: AC
  Administered 2022-07-20: 3 mL via RESPIRATORY_TRACT
  Filled 2022-07-20: qty 3

## 2022-07-20 MED ORDER — SODIUM CHLORIDE 0.9 % IV BOLUS
1000.0000 mL | Freq: Once | INTRAVENOUS | Status: AC
  Administered 2022-07-20: 1000 mL via INTRAVENOUS

## 2022-07-20 MED ORDER — ALBUTEROL (5 MG/ML) CONTINUOUS INHALATION SOLN: 10.0000 mg/h | INHALATION_SOLUTION | Freq: Once | RESPIRATORY_TRACT | Status: DC

## 2022-07-20 MED ORDER — AZITHROMYCIN 250 MG PO TABS: ORAL_TABLET | ORAL | 0 refills | Status: AC

## 2022-07-20 MED ORDER — PREDNISONE 20 MG PO TABS: 40.0000 mg | ORAL_TABLET | Freq: Every day | ORAL | 0 refills | Status: DC

## 2022-07-20 MED ORDER — DEXAMETHASONE SODIUM PHOSPHATE 10 MG/ML IJ SOLN
10.0000 mg | Freq: Once | INTRAMUSCULAR | Status: AC
  Administered 2022-07-20: 10 mg via INTRAVENOUS
  Filled 2022-07-20: qty 1

## 2022-07-20 MED ORDER — ALBUTEROL SULFATE (2.5 MG/3ML) 0.083% IN NEBU
INHALATION_SOLUTION | RESPIRATORY_TRACT | Status: AC
  Filled 2022-07-20: qty 12

## 2022-07-20 MED ORDER — ALBUTEROL SULFATE (2.5 MG/3ML) 0.083% IN NEBU: 2.5000 mg | INHALATION_SOLUTION | Freq: Once | RESPIRATORY_TRACT | Status: DC

## 2022-07-20 NOTE — ED Notes (Signed)
Pt walked to the bathroom and down the hall. O2 dropped to 74 and back up to 86. PA was notified immediately

## 2022-07-20 NOTE — ED Provider Notes (Signed)
[facility] Provider Note   CSN: [id number] Arrival date & time: 07/20/22  1840     History  Chief Complaint  Patient presents with   Weakness    Christina [name] is a 77 y.o. female with medical history of acid reflux, hypertension, lung cancer status post chemo and radiation in remission.  Patient presents to ED for evaluation of weakness.  Patient reports that 2 hours prior to arrival she began generally weak.  Patient reports she was sitting on the couch when this occurred.  Patient reports she has been battling URI for the last 1 week.  Patient reports that other members of the household are also sick with URI, she took a home COVID test which was negative.  Patient goes on to state that she has also had decreased appetite secondary to feeling malaised for the last 1 week.  Patient denies fevers, sore throat, nausea, vomiting, diarrhea, abdominal pain, chest pain, shortness of breath, dysuria.   Weakness Associated symptoms: no abdominal pain, no chest pain, no diarrhea, no dysuria, no fever, no nausea, no shortness of breath and no vomiting        Home Medications Prior to Admission medications   Medication Sig Start Date End Date Taking? Authorizing Provider  azithromycin (ZITHROMAX Z-PAK) 250 MG tablet Take 2 tablets (500 mg total) by mouth daily for 1 day, THEN 1 tablet (250 mg total) daily for 4 days. 07/20/22 07/25/22 Yes Azucena Cecil, PA-C  predniSONE (DELTASONE) 20 MG tablet Take 2 tablets (40 mg total) by mouth daily. 07/20/22  Yes Azucena Cecil, PA-C  albuterol (VENTOLIN HFA) 108 (90 Base) MCG/ACT inhaler INHALE 1 TO 2 PUFFS INTO THE LUNGS EVERY 6 HOURS AS NEEDED FOR WHEEZING OR SHORTNESS OF BREATH 07/01/22   [name], NP  amLODipine (NORVASC) 10 MG tablet TAKE 1 TABLET(10 MG) BY MOUTH DAILY 06/21/22   [name], NP  aspirin EC 81 MG tablet Take 81 mg by mouth daily.    [provider]  atorvastatin  (LIPITOR) 40 MG tablet TAKE 1 TABLET BY MOUTH DAILY 06/21/22   [name], NP  benzonatate (TESSALON) 200 MG capsule Take 1 capsule (200 mg total) by mouth 2 (two) times daily as needed for cough. 06/29/22   Tempie Hoist, FNP  benzonatate (TESSALON) 200 MG capsule Take 1 capsule (200 mg total) by mouth 2 (two) times daily as needed for cough. 07/13/22   Tempie Hoist, FNP  fluticasone (FLONASE) 50 MCG/ACT nasal spray Place 2 sprays into both nostrils daily. 05/28/21   Carvel Getting, NP  omeprazole (PRILOSEC) 40 MG capsule TAKE 1 CAPSULE(40 MG) BY MOUTH DAILY 07/09/22   [name], NP  ondansetron (ZOFRAN) 4 MG tablet Take 1 tablet (4 mg total) by mouth every 6 (six) hours. 07/16/22   Prosperi, Christian H, PA-C  potassium chloride (KLOR-CON M) 10 MEQ tablet TAKE 1 TABLET(10 MEQ) BY MOUTH TWICE DAILY 06/03/22   [name], NP  sucralfate (CARAFATE) 1 g tablet TAKE 1 TABLET(1 GRAM) BY MOUTH FOUR TIMES DAILY AS NEEDED FOR STOMACH ACID 06/03/22   [name], NP      Allergies    Coconut (cocos nucifera), Codeine, Shrimp [shellfish allergy], and Trandolapril    Review of Systems   Review of Systems  Constitutional:  Negative for appetite change and fever.  HENT:  Negative for sore throat.   Respiratory:  Negative for shortness of  breath.   Cardiovascular:  Negative for chest pain.  Gastrointestinal:  Negative for abdominal pain, diarrhea, nausea and vomiting.  Genitourinary:  Negative for dysuria.  Neurological:  Positive for weakness.  All other systems reviewed and are negative.   Physical Exam Updated Vital Signs BP 129/89   Pulse (!) 107   Temp 98.2 F (36.8 C)   Resp 19   SpO2 99%  Physical Exam Vitals and nursing note reviewed.  Constitutional:      General: She is not in acute distress.    Appearance: Normal appearance. She is not ill-appearing, toxic-appearing or diaphoretic.  HENT:     Head: Normocephalic and atraumatic.     Nose: Nose normal. No  congestion.     Mouth/Throat:     Mouth: Mucous membranes are moist.     Pharynx: Oropharynx is clear.  Eyes:     Extraocular Movements: Extraocular movements intact.     Conjunctiva/sclera: Conjunctivae normal.     Pupils: Pupils are equal, round, and reactive to light.  Cardiovascular:     Rate and Rhythm: Normal rate and regular rhythm.  Pulmonary:     Effort: Pulmonary effort is normal.     Breath sounds: Wheezing present.  Abdominal:     General: Abdomen is flat. Bowel sounds are normal.     Palpations: Abdomen is soft.     Tenderness: There is no abdominal tenderness.  Skin:    General: Skin is warm and dry.     Capillary Refill: Capillary refill takes less than 2 seconds.  Neurological:     General: No focal deficit present.     Mental Status: She is alert and oriented to person, place, and time. Mental status is at baseline.     GCS: GCS eye subscore is 4. GCS verbal subscore is 5. GCS motor subscore is 6.     Cranial Nerves: Cranial nerves 2-12 are intact. No cranial nerve deficit.     Sensory: Sensation is intact. No sensory deficit.     Motor: Motor function is intact. No weakness.     Coordination: Coordination is intact. Heel to [facility] Test normal.     ED Results / Procedures / Treatments   Labs (all labs ordered are listed, but only abnormal results are displayed) Labs Reviewed  CBC WITH DIFFERENTIAL/PLATELET - Abnormal; Notable for the following components:      Result Value   Hemoglobin 11.5 (*)    HCT 35.7 (*)    All other components within normal limits  BASIC METABOLIC PANEL - Abnormal; Notable for the following components:   Calcium 8.6 (*)    All other components within normal limits  RESP PANEL BY RT-PCR (RSV, FLU A&B, COVID)  [id number]  MAGNESIUM    EKG EKG Interpretation  Date/Time:  Saturday July 20 2022 19:54:50 EST Ventricular Rate:  85 PR Interval:  133 QRS Duration: 97 QT Interval:  375 QTC Calculation: 446 R Axis:   91 Text  Interpretation: Sinus rhythm Right atrial enlargement Right axis deviation No significant change since last tracing Confirmed by Georgina Snell [phone]) on 07/20/2022 7:57:50 PM  Radiology DG Chest 2 View  Result Date: 07/20/2022 CLINICAL DATA:  Wheezing and cough for 2 weeks. EXAM: CHEST - 2 VIEW COMPARISON:  07/16/2022. FINDINGS: The heart size and mediastinal contours are within normal limits. There is mild atherosclerotic calcification of the aorta. Emphysematous changes are present in the lungs. Chronic linear densities are noted in the right upper lobe compatible with known  posttherapy changes from treated lung cancer. No effusion or pneumothorax. No acute osseous abnormality. IMPRESSION: 1. Stable posttherapy changes in the right upper lobe. No acute process. 2. Emphysema. 3. Aortic atherosclerosis. Electronically Signed   By: Brett Fairy M.D.   On: 07/20/2022 20:33    Procedures Procedures   Medications Ordered in ED Medications  albuterol (PROVENTIL) (2.5 MG/3ML) 0.083% nebulizer solution (10 mg/hr Nebulization New Bag/Given 07/20/22 2142)  albuterol (PROVENTIL) (2.5 MG/3ML) 0.083% nebulizer solution (  Not Given 07/20/22 2146)  ipratropium-albuterol (DUONEB) 0.5-2.5 (3) MG/3ML nebulizer solution 3 mL (3 mLs Nebulization Given 07/20/22 2009)  sodium chloride 0.9 % bolus 1,000 mL (0 mLs Intravenous Stopped 07/20/22 2056)  dexamethasone (DECADRON) injection 10 mg (10 mg Intravenous Given 07/20/22 1947)  ipratropium-albuterol (DUONEB) 0.5-2.5 (3) MG/3ML nebulizer solution 3 mL (3 mLs Nebulization Given 07/20/22 2102)    ED Course/ Medical Decision Making/ A&P  Medical Decision Making Amount and/or Complexity of Data Reviewed Labs: ordered. Radiology: ordered.  Risk Prescription drug management.   77 year old female presents to ED for evaluation.  Please see HPI for further details.  On examination patient afebrile, tachycardic.  Patient lung sounds have wheezing throughout, patient denies  shortness of breath or chest pain.  Patient denies history of COPD or asthma.  Patient abdomen soft and compressible throughout.  Normal neurological examination at baseline.  Patient will be worked up utilizing CBC, BMP, magnesium, viral panel, chest x-ray, EKG.  Patient provided 10 mg Decadron, DuoNeb, 1 L fluid  Patient CBC unremarkable with a stable hemoglobin, no leukocytosis.  Patient BMP unremarkable without electrolyte derangement.  Patient viral testing negative for all.  Patient magnesium within normal limits.  Chest x-ray unremarkable however does show emphysema.  Patient EKG nonischemic.  After initial DuoNeb patient continued to wheeze, second DuoNeb ordered at this time.  On reexamination, patient had continued wheezing despite 2 DuoNeb's.  Patient placed on continuous DuoNeb for 1 hour at this time.  Patient ambulated and desatted down to 78% on room air.  After duo nebulizer continuous patient was ambulated for second time and she maintained oxygen saturation of 95%.  Patient denies shortness of breath, states that she does not wish to go home.  At this time, patient will be discharged home and advised to follow-up with PCP.  Patient will be referred to pulmonology for suspected COPD.  Patient will be sent home on 4 days of steroids as well as Z-Pak.  Patient provided return precautions and she voiced understanding.  Patient had all of her questions answered to her satisfaction.  Patient stable for discharge.  Patient discussed with attending Dr. Nechama Guard who voices agreement with plan of management.  Final Clinical Impression(s) / ED Diagnoses Final diagnoses:  Weakness  Wheezing    Rx / DC Orders ED Discharge Orders          Ordered    azithromycin (ZITHROMAX Z-PAK) 250 MG tablet  Daily        07/20/22 2334    predniSONE (DELTASONE) 20 MG tablet  Daily        07/20/22 2334              Azucena Cecil, PA-C 07/20/22 2335    Elgie Congo,  MD 07/22/22 1113

## 2022-07-20 NOTE — ED Notes (Signed)
Pt walked in the hallway, O2 stayed in the upper 90s and no complaints. PA was notified.

## 2022-07-20 NOTE — ED Triage Notes (Signed)
Patient has been feeling weak for the past few days. No energy. Nonproductive cough.

## 2022-07-20 NOTE — Discharge Instructions (Signed)
Return to the ED with any new or worsening signs or symptoms Please follow-up with your PCP regarding weakness Please follow-up with pulmonology.  Please call and make an appointment to be seen regarding your suspected COPD Please pick up steroids have sent in.  You will begin taking them tomorrow.  You will take 40 mg once daily for 4 days Please pick up the Z-Pak I have sent in.  You will take 500 mg on the first day, and then 250 mg once a day for 4 days afterward

## 2022-07-25 ENCOUNTER — Other Ambulatory Visit: Payer: Self-pay

## 2022-07-25 DIAGNOSIS — J3089 Other allergic rhinitis: Secondary | ICD-10-CM

## 2022-07-25 DIAGNOSIS — R062 Wheezing: Secondary | ICD-10-CM

## 2022-07-25 MED ORDER — ALBUTEROL SULFATE HFA 108 (90 BASE) MCG/ACT IN AERS: 1.0000 | INHALATION_SPRAY | Freq: Four times a day (QID) | RESPIRATORY_TRACT | 0 refills | Status: DC | PRN

## 2022-08-27 ENCOUNTER — Other Ambulatory Visit: Payer: Self-pay | Admitting: Family

## 2022-08-27 DIAGNOSIS — I10 Essential (primary) hypertension: Secondary | ICD-10-CM

## 2022-08-27 NOTE — Telephone Encounter (Signed)
Order complete. 

## 2022-09-16 NOTE — Progress Notes (Signed)
Patient ID: Christina [name], female    DOB: 01-06-1946  MRN: [id number]  CC: Chronic Care Management   Subjective: Christina [name] is a 77 y.o. female who presents for chronic care management.   Her concerns today include:  - Doing well on blood pressure medication, no issues/concerns. She denies red flag symptoms such as but not limited to chest pain, shortness of breath, worst headache of life, nausea/vomiting. Home blood pressures 130's/80's.  - Doing well on cholesterol medication, no issues/concerns.  - Needs refills on Omeprazole and Carafate.  - No further issues/concerns for discussion today.  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 6.7 g 0   aspirin EC 81 MG tablet Take 81 mg by mouth daily.     benzonatate (TESSALON) 200 MG capsule Take 1 capsule (200 mg total) by mouth 2 (two) times daily as needed for cough. 20 capsule 0   benzonatate (TESSALON) 200 MG capsule Take 1 capsule (200 mg total) by mouth 2 (two) times daily as needed for cough.  20 capsule 0   fluticasone (FLONASE) 50 MCG/ACT nasal spray Place 2 sprays into both nostrils daily. 16 g 6   ondansetron (ZOFRAN) 4 MG tablet Take 1 tablet (4 mg total) by mouth every 6 (six) hours. 12 tablet 0   potassium chloride (KLOR-CON M) 10 MEQ tablet TAKE 1 TABLET(10 MEQ) BY MOUTH TWICE DAILY 180 tablet 0   predniSONE (DELTASONE) 20 MG tablet Take 2 tablets (40 mg total) by mouth daily. 8 tablet 0   No current facility-administered medications on file prior to visit.    Allergies  Allergen Reactions   Coconut (Cocos Nucifera) Hives   Codeine Other (See Comments)    Upsets stomach really bad   Shrimp [Shellfish Allergy] Hives   Trandolapril Swelling    Swelling of the face/lips 07/2014    Social History   Socioeconomic History   Marital status: Married    Spouse name: Chrissie NoaWilliam   Number of children: 1   Years of education: 12th grade   Highest education level: Not on file  Occupational History   Occupation: school housekeeping    Comment: part-time  Tobacco Use   Smoking status: Former    Packs/day: 0.50    Years: 50.00    Additional pack years: 0.00    Total pack years: 25.00    Types: Cigarettes    Passive exposure: Past   Smokeless tobacco: Never   Tobacco comments:    Previously quit in 2015 but has started smoking again   Vaping Use   Vaping Use: Never used  Substance and Sexual Activity   Alcohol use: No    Alcohol/week: 0.0 standard drinks of alcohol   Drug use: No   Sexual activity: Not Currently  Other Topics Concern   Not on file  Social History Narrative   Lives with her husband. Her daughter also lives in [place] with her husband and 3 children.   Social Determinants of Health   Financial Resource Strain: Not on file  Food Insecurity: Not on file  Transportation Needs: Not on file  Physical Activity: Not on file  Stress: Not on file  Social Connections: Not on file  Intimate Partner Violence: Not on file    Family History  Problem  Relation Age of Onset   Hypertension Mother    Heart disease Mother    Leukemia Father    Cancer Father    Hypertension Sister    Hypertension Sister    Colon cancer Neg Hx    Colon polyps Neg Hx    Stomach cancer Neg Hx    Esophageal cancer Neg Hx     Past Surgical History:  Procedure Laterality Date   CESAREAN SECTION     1 time   COLONOSCOPY     POLYPECTOMY     VIDEO BRONCHOSCOPY WITH ENDOBRONCHIAL NAVIGATION N/A 06/24/2018   Procedure: VIDEO BRONCHOSCOPY WITH ENDOBRONCHIAL NAVIGATION with biopsies, right upper lung lobe;  Surgeon: Loreli Slot, MD;  Location: [facility] OR;  Service: Thoracic;  Laterality: N/A;   VIDEO BRONCHOSCOPY WITH ENDOBRONCHIAL ULTRASOUND N/A 06/24/2018   Procedure: VIDEO BRONCHOSCOPY WITH ENDOBRONCHIAL ULTRASOUND, right upper lung lobe;  Surgeon: Loreli Slot, MD;  Location: [facility] OR;  Service: Thoracic;  Laterality: N/A;    ROS: Review of Systems Negative except as stated above  PHYSICAL EXAM: BP (!) 143/87 (BP Location: Right Arm, Patient Position: Sitting, Cuff Size: Normal)   Pulse 95   Temp 98.1 F (36.7 C)   Resp 14   Ht 5\' 7"  (1.702 m)   Wt 176 lb 9.6 oz (80.1 kg)   SpO2 96%   BMI 27.66 kg/m   Physical Exam HENT:     Head: Normocephalic and atraumatic.  Eyes:     Extraocular Movements: Extraocular movements intact.     Conjunctiva/sclera: Conjunctivae normal.     Pupils: Pupils are equal, round, and reactive to light.  Cardiovascular:     Rate and Rhythm: Normal rate and regular rhythm.     Pulses: Normal pulses.     Heart sounds: Normal heart sounds.  Pulmonary:     Effort: Pulmonary effort is normal.     Breath sounds: Normal breath sounds.  Musculoskeletal:     Cervical back: Normal range of motion and neck supple.  Neurological:     General: No focal deficit present.     Mental Status: She is alert and oriented to person, place, and time.  Psychiatric:        Mood and Affect: Mood normal.        Behavior: Behavior  normal.      ASSESSMENT AND PLAN: 1. Primary hypertension - Continue Amlodipine as prescribed.  - Counseled on blood pressure goal of less than 140/90, low-sodium, DASH diet, medication compliance, 150 minutes of moderate intensity exercise per week as tolerated. Discussed medication compliance, adverse effects. - Follow-up with primary provider in 6 months or sooner if needed. - amLODipine (NORVASC) 10 MG tablet; TAKE 1 TABLET(10 MG) BY MOUTH DAILY  Dispense: 90 tablet; Refill: 0  2. Hyperlipidemia, unspecified hyperlipidemia type -  Continue Atorvastatin as prescribed.  - Follow-up with primary provider in 6 months or sooner if needed.  - atorvastatin (LIPITOR) 40 MG tablet; TAKE 1 TABLET BY MOUTH DAILY  Dispense: 90 tablet; Refill: 0  3. Prediabetes - Routine screening.  - POCT glycosylated hemoglobin (Hb A1C)  4. Gastroesophageal reflux disease without esophagitis - Continue Sucralfate and Omeprazole as prescribed.  - Follow-up with primary provider as scheduled.  - sucralfate (CARAFATE) 1 g tablet; TAKE 1 TABLET(1 GRAM) BY MOUTH FOUR TIMES DAILY AS NEEDED FOR STOMACH ACID  Dispense: 120 tablet; Refill: 3 - omeprazole (PRILOSEC) 40 MG capsule; Take 1 capsule (40 mg total) by mouth daily.  Dispense: 90 capsule; Refill: 0   Patient was given the opportunity to ask questions.  Patient verbalized understanding of the plan and was able to repeat key elements of the plan. Patient was given clear instructions to go to Emergency Department or return to medical center if symptoms don't improve, worsen, or new problems develop.The patient verbalized understanding.   Orders Placed This Encounter  Procedures   POCT glycosylated hemoglobin (Hb A1C)     Requested Prescriptions   Signed Prescriptions Disp Refills   amLODipine (NORVASC) 10 MG tablet 90 tablet 0    Sig: TAKE 1 TABLET(10 MG) BY MOUTH DAILY   atorvastatin (LIPITOR) 40 MG tablet 90 tablet 0    Sig: TAKE 1 TABLET BY MOUTH  DAILY   sucralfate (CARAFATE) 1 g tablet 120 tablet 3    Sig: TAKE 1 TABLET(1 GRAM) BY MOUTH FOUR TIMES DAILY AS NEEDED FOR STOMACH ACID   omeprazole (PRILOSEC) 40 MG capsule 90 capsule 0    Sig: Take 1 capsule (40 mg total) by mouth daily.    Return in about 6 months (around 03/22/2023) for Follow-Up or next available chronic care mgmt .  Rema Fendt, NP

## 2022-09-20 ENCOUNTER — Ambulatory Visit: Payer: Commercial Managed Care - PPO | Admitting: Family

## 2022-09-20 ENCOUNTER — Encounter: Payer: Self-pay | Admitting: Family

## 2022-09-20 VITALS — BP 143/87 | HR 95 | Temp 98.1°F | Resp 14 | Ht 67.0 in | Wt 176.6 lb

## 2022-09-20 DIAGNOSIS — I10 Essential (primary) hypertension: Secondary | ICD-10-CM | POA: Diagnosis not present

## 2022-09-20 DIAGNOSIS — R7303 Prediabetes: Secondary | ICD-10-CM

## 2022-09-20 DIAGNOSIS — F17210 Nicotine dependence, cigarettes, uncomplicated: Secondary | ICD-10-CM

## 2022-09-20 DIAGNOSIS — E785 Hyperlipidemia, unspecified: Secondary | ICD-10-CM

## 2022-09-20 DIAGNOSIS — K219 Gastro-esophageal reflux disease without esophagitis: Secondary | ICD-10-CM | POA: Diagnosis not present

## 2022-09-20 LAB — POCT GLYCOSYLATED HEMOGLOBIN (HGB A1C): HbA1c POC (<> result, manual entry): 5.5 % (ref 4.0–5.6)

## 2022-09-20 MED ORDER — AMLODIPINE BESYLATE 10 MG PO TABS: ORAL_TABLET | ORAL | 0 refills | Status: DC

## 2022-09-20 MED ORDER — SUCRALFATE 1 G PO TABS: ORAL_TABLET | ORAL | 3 refills | Status: DC

## 2022-09-20 MED ORDER — OMEPRAZOLE 40 MG PO CPDR: 40.0000 mg | DELAYED_RELEASE_CAPSULE | Freq: Every day | ORAL | 0 refills | Status: DC

## 2022-09-20 MED ORDER — ATORVASTATIN CALCIUM 40 MG PO TABS: ORAL_TABLET | ORAL | 0 refills | Status: DC

## 2022-09-20 NOTE — Progress Notes (Signed)
Pt here for chronic care management   No questions or concerns at this time  

## 2022-09-25 ENCOUNTER — Telehealth: Payer: Self-pay | Admitting: Internal Medicine

## 2022-09-25 NOTE — Telephone Encounter (Signed)
Called patient regarding upcoming April appointments changes, left a voicemail.

## 2022-09-30 ENCOUNTER — Ambulatory Visit (HOSPITAL_COMMUNITY)
Admission: RE | Admit: 2022-09-30 | Discharge: 2022-09-30 | Disposition: A | Discharge status: Home / Self Care | Payer: Commercial Managed Care - PPO | Source: Ambulatory Visit | Attending: Internal Medicine | Admitting: Internal Medicine

## 2022-09-30 ENCOUNTER — Inpatient Hospital Stay: Payer: Commercial Managed Care - PPO | Attending: Nurse Practitioner

## 2022-09-30 ENCOUNTER — Inpatient Hospital Stay: Payer: Commercial Managed Care - PPO

## 2022-09-30 DIAGNOSIS — C3490 Malignant neoplasm of unspecified part of unspecified bronchus or lung: Secondary | ICD-10-CM | POA: Diagnosis present

## 2022-09-30 DIAGNOSIS — Z9221 Personal history of antineoplastic chemotherapy: Secondary | ICD-10-CM | POA: Insufficient documentation

## 2022-09-30 DIAGNOSIS — Z85118 Personal history of other malignant neoplasm of bronchus and lung: Secondary | ICD-10-CM | POA: Insufficient documentation

## 2022-09-30 DIAGNOSIS — Z923 Personal history of irradiation: Secondary | ICD-10-CM | POA: Insufficient documentation

## 2022-09-30 LAB — CMP (CANCER CENTER ONLY)
ALT: 13 U/L (ref 0–44)
AST: 19 U/L (ref 15–41)
Albumin: 4.1 g/dL (ref 3.5–5.0)
Alkaline Phosphatase: 78 U/L (ref 38–126)
Anion gap: 6 (ref 5–15)
BUN: 20 mg/dL (ref 8–23)
CO2: 25 mmol/L (ref 22–32)
Calcium: 9.7 mg/dL (ref 8.9–10.3)
Chloride: 108 mmol/L (ref 98–111)
Creatinine: 1.08 mg/dL — ABNORMAL HIGH (ref 0.44–1.00)
GFR, Estimated: 54 mL/min — ABNORMAL LOW (ref >60)
Glucose, Bld: 98 mg/dL (ref 70–99)
Potassium: 3.7 mmol/L (ref 3.5–5.1)
Sodium: 139 mmol/L (ref 135–145)
Total Bilirubin: 0.4 mg/dL (ref 0.3–1.2)
Total Protein: 7 g/dL (ref 6.5–8.1)

## 2022-09-30 LAB — CBC WITH DIFFERENTIAL (CANCER CENTER ONLY)
Abs Immature Granulocytes: 0.02 10*3/uL (ref 0.00–0.07)
Basophils Absolute: 0.1 10*3/uL (ref 0.0–0.1)
Basophils Relative: 1 %
Eosinophils Absolute: 0.1 10*3/uL (ref 0.0–0.5)
Eosinophils Relative: 2 %
HCT: 38.4 % (ref 36.0–46.0)
Hemoglobin: 12.9 g/dL (ref 12.0–15.0)
Immature Granulocytes: 0 %
Lymphocytes Relative: 24 %
Lymphs Abs: 1.6 10*3/uL (ref 0.7–4.0)
MCH: 29.9 pg (ref 26.0–34.0)
MCHC: 33.6 g/dL (ref 30.0–36.0)
MCV: 89.1 fL (ref 80.0–100.0)
Monocytes Absolute: 0.5 10*3/uL (ref 0.1–1.0)
Monocytes Relative: 8 %
Neutro Abs: 4.4 10*3/uL (ref 1.7–7.7)
Neutrophils Relative %: 65 %
Platelet Count: 311 10*3/uL (ref 150–400)
RBC: 4.31 MIL/uL (ref 3.87–5.11)
RDW: 14.8 % (ref 11.5–15.5)
WBC Count: 6.7 10*3/uL (ref 4.0–10.5)
nRBC: 0 % (ref 0.0–0.2)

## 2022-09-30 MED ORDER — IOHEXOL 300 MG/ML  SOLN
75.0000 mL | Freq: Once | INTRAMUSCULAR | Status: AC | PRN
  Administered 2022-09-30: 75 mL via INTRAVENOUS

## 2022-10-02 ENCOUNTER — Other Ambulatory Visit: Payer: Self-pay

## 2022-10-02 ENCOUNTER — Inpatient Hospital Stay: Payer: Commercial Managed Care - PPO | Admitting: Internal Medicine

## 2022-10-02 VITALS — BP 149/89 | HR 88 | Temp 100.1°F | Resp 15 | Wt 176.7 lb

## 2022-10-02 DIAGNOSIS — Z923 Personal history of irradiation: Secondary | ICD-10-CM | POA: Diagnosis not present

## 2022-10-02 DIAGNOSIS — Z9221 Personal history of antineoplastic chemotherapy: Secondary | ICD-10-CM | POA: Diagnosis not present

## 2022-10-02 DIAGNOSIS — Z85118 Personal history of other malignant neoplasm of bronchus and lung: Secondary | ICD-10-CM | POA: Diagnosis present

## 2022-10-02 DIAGNOSIS — C3490 Malignant neoplasm of unspecified part of unspecified bronchus or lung: Secondary | ICD-10-CM

## 2022-10-02 NOTE — Progress Notes (Signed)
[facility] Telephone:([phone]) [phone]   [phone]  OFFICE PROGRESS NOTE  Christina Fendt, NP [address] [place] [address]  DIAGNOSIS: Stage IIIB (T4, N2, M0) non-small cell lung cancer, poorly differentiated squamous cell carcinoma.  She presented with large right upper lobe lung mass in addition to right hilar and mediastinal lymphadenopathy diagnosed in January 2020.  PRIOR THERAPY:  1) Concurrent chemoradiation with weekly carboplatin for AUC of 2 and paclitaxel 45 mg/M2.  First dose July 13, 2018.  Status post 8 cycles.  Last dose was given August 31, 2018. 2) Consolidation treatment with immunotherapy with Imfinzi 10 mg/KG every 2 weeks.  First dose starts October 13, 2018.  Status post 26 cycles.  CURRENT THERAPY: Observation.  INTERVAL HISTORY: Christina [name] 77 y.o. female returns to the neck today for 71-month follow-up visit.  The patient is feeling fine today with no concerning complaints.  She has no chest pain, shortness of breath, cough or hemoptysis.  She has no nausea, vomiting, diarrhea or constipation.  She has no headache or visual changes.  She denied having any recent weight loss or night sweats.  She is here today for evaluation with repeat CT scan of the chest for restaging of her disease.  MEDICAL HISTORY: Past Medical History:  Diagnosis Date   Allergy    GERD (gastroesophageal reflux disease)    Hyperlipidemia    Hypertension    nscl ca dx'd 05/2018   lung cancer   Reflux    Tubulovillous adenoma of colon 2003    ALLERGIES:  is allergic to coconut (cocos nucifera), codeine, shrimp [shellfish allergy], and trandolapril.  MEDICATIONS:  Current Outpatient Medications  Medication Sig Dispense Refill   albuterol (VENTOLIN HFA) 108 (90 Base) MCG/ACT inhaler Inhale 1-2 puffs into the lungs every 6 (six) hours as needed for wheezing or shortness of breath. 6.7 g 0   amLODipine (NORVASC) 10 MG tablet TAKE 1  TABLET(10 MG) BY MOUTH DAILY 90 tablet 0   aspirin EC 81 MG tablet Take 81 mg by mouth daily.     atorvastatin (LIPITOR) 40 MG tablet TAKE 1 TABLET BY MOUTH DAILY 90 tablet 0   benzonatate (TESSALON) 200 MG capsule Take 1 capsule (200 mg total) by mouth 2 (two) times daily as needed for cough. 20 capsule 0   benzonatate (TESSALON) 200 MG capsule Take 1 capsule (200 mg total) by mouth 2 (two) times daily as needed for cough. 20 capsule 0   fluticasone (FLONASE) 50 MCG/ACT nasal spray Place 2 sprays into both nostrils daily. 16 g 6   omeprazole (PRILOSEC) 40 MG capsule Take 1 capsule (40 mg total) by mouth daily. 90 capsule 0   ondansetron (ZOFRAN) 4 MG tablet Take 1 tablet (4 mg total) by mouth every 6 (six) hours. 12 tablet 0   potassium chloride (KLOR-CON M) 10 MEQ tablet TAKE 1 TABLET(10 MEQ) BY MOUTH TWICE DAILY 180 tablet 0   predniSONE (DELTASONE) 20 MG tablet Take 2 tablets (40 mg total) by mouth daily. 8 tablet 0   sucralfate (CARAFATE) 1 g tablet TAKE 1 TABLET(1 GRAM) BY MOUTH FOUR TIMES DAILY AS NEEDED FOR STOMACH ACID 120 tablet 3   No current facility-administered medications for this visit.    SURGICAL HISTORY:  Past Surgical History:  Procedure Laterality Date   CESAREAN SECTION     1 time   COLONOSCOPY     POLYPECTOMY     VIDEO BRONCHOSCOPY WITH  ENDOBRONCHIAL NAVIGATION N/A 06/24/2018   Procedure: VIDEO BRONCHOSCOPY WITH ENDOBRONCHIAL NAVIGATION with biopsies, right upper lung lobe;  Surgeon: Loreli Slot, MD;  Location: [facility] OR;  Service: Thoracic;  Laterality: N/A;   VIDEO BRONCHOSCOPY WITH ENDOBRONCHIAL ULTRASOUND N/A 06/24/2018   Procedure: VIDEO BRONCHOSCOPY WITH ENDOBRONCHIAL ULTRASOUND, right upper lung lobe;  Surgeon: Loreli Slot, MD;  Location: [facility] OR;  Service: Thoracic;  Laterality: N/A;    REVIEW OF SYSTEMS:  A comprehensive review of systems was negative.   PHYSICAL EXAMINATION: General appearance: alert, cooperative, and no distress Head:  Normocephalic, without obvious abnormality, atraumatic Neck: no adenopathy, no JVD, supple, symmetrical, trachea midline, and thyroid not enlarged, symmetric, no tenderness/mass/nodules Lymph nodes: Cervical, supraclavicular, and axillary nodes normal. Resp: clear to auscultation bilaterally Back: symmetric, no curvature. ROM normal. No CVA tenderness. Cardio: regular rate and rhythm, S1, S2 normal, no murmur, click, rub or gallop GI: soft, non-tender; bowel sounds normal; no masses,  no organomegaly Extremities: extremities normal, atraumatic, no cyanosis or edema  ECOG PERFORMANCE STATUS: 0 - Asymptomatic  Blood pressure (!) 149/89, pulse 88, temperature 100.1 F (37.8 C), temperature source Oral, resp. rate 15, weight 176 lb 11.2 oz (80.2 kg), SpO2 95 %.  LABORATORY DATA: Lab Results  Component Value Date   WBC 6.7 09/30/2022   HGB 12.9 09/30/2022   HCT 38.4 09/30/2022   MCV 89.1 09/30/2022   PLT 311 09/30/2022      Chemistry      Component Value Date/Time   NA 139 09/30/2022 1554   NA 143 12/05/2021 1027   K 3.7 09/30/2022 1554   CL 108 09/30/2022 1554   CO2 25 09/30/2022 1554   BUN 20 09/30/2022 1554   BUN 16 12/05/2021 1027   CREATININE 1.08 (H) 09/30/2022 1554   CREATININE 0.87 03/22/2016 0908      Component Value Date/Time   CALCIUM 9.7 09/30/2022 1554   ALKPHOS 78 09/30/2022 1554   AST 19 09/30/2022 1554   ALT 13 09/30/2022 1554   BILITOT 0.4 09/30/2022 1554       RADIOGRAPHIC STUDIES: No results found.   ASSESSMENT AND PLAN: This is a very pleasant 77 years old African-American female recently diagnosed with a stage IIIB (T4, N2, M0) non-small cell lung cancer, poorly differentiated squamous cell carcinoma presented with right upper lobe lung mass in addition to right hilar and mediastinal lymphadenopathy diagnosed in January 2020. The patient underwent concurrent chemoradiation with weekly carboplatin and paclitaxel status post 8 cycles. She has partial  response to this treatment. She also completed a course of consolidation treatment with immunotherapy with Imfinzi every 2 weeks status post 26 cycles. The patient has been in observation since that time and she is feeling well with no concerning complaints. She had repeat CT scan of the chest performed recently.  The final report is still pending but I personally and independently reviewed the scan images and I do not see any concerning findings for disease progression.  I will wait for the final report for confirmation. I recommended for the patient to continue on observation with repeat CT scan of the chest in 6 months. She was advised to call immediately if she has any other concerning symptoms in the interval. The patient voices understanding of current disease status and treatment options and is in agreement with the current care plan.  All questions were answered. The patient knows to call the clinic with any problems, questions or concerns. We can certainly see the patient much sooner  if necessary.  Disclaimer: This note was dictated with voice recognition software. Similar sounding words can inadvertently be transcribed and may not be corrected upon review.

## 2022-10-24 ENCOUNTER — Telehealth: Payer: Commercial Managed Care - PPO | Admitting: Physician Assistant

## 2022-10-24 DIAGNOSIS — Z20818 Contact with and (suspected) exposure to other bacterial communicable diseases: Secondary | ICD-10-CM

## 2022-10-24 DIAGNOSIS — J029 Acute pharyngitis, unspecified: Secondary | ICD-10-CM | POA: Diagnosis not present

## 2022-10-24 MED ORDER — AZITHROMYCIN 250 MG PO TABS
ORAL_TABLET | ORAL | 0 refills | Status: AC
Start: 2022-10-24 — End: 2022-10-29

## 2022-10-24 NOTE — Progress Notes (Signed)
Virtual Visit Consent   Christina [name], you are scheduled for a virtual visit with a [facility] provider today. Just as with appointments in the office, your consent must be obtained to participate. Your consent will be active for this visit and any virtual visit you may have with one of our providers in the next 365 days. If you have a MyChart account, a copy of this consent can be sent to you electronically.  As this is a virtual visit, video technology does not allow for your provider to perform a traditional examination. This may limit your provider's ability to fully assess your condition. If your provider identifies any concerns that need to be evaluated in person or the need to arrange testing (such as labs, EKG, etc.), we will make arrangements to do so. Although advances in technology are sophisticated, we cannot ensure that it will always work on either your end or our end. If the connection with a video visit is poor, the visit may have to be switched to a telephone visit. With either a video or telephone visit, we are not always able to ensure that we have a secure connection.  By engaging in this virtual visit, you consent to the provision of healthcare and authorize for your insurance to be billed (if applicable) for the services provided during this visit. Depending on your insurance coverage, you may receive a charge related to this service.  I need to obtain your verbal consent now. Are you willing to proceed with your visit today? DEAISA HOSAKA has provided verbal consent on 10/24/2022 for a virtual visit (video or telephone). Christina [name], [place]  Date: 10/24/2022 8:36 AM  Virtual Visit via Video Note   I, Christina [name], connected with  Christina [name]  ([id number], 03-Jun-1946) on 10/24/22 at  9:15 AM EDT by a video-enabled telemedicine application and verified that I am speaking with the correct person using two identifiers.  Location: Patient: Virtual Visit Location  Patient: Home Provider: Virtual Visit Location Provider: Home Office   I discussed the limitations of evaluation and management by telemedicine and the availability of in person appointments. The patient expressed understanding and agreed to proceed.    History of Present Illness: Christina [name] is a 77 y.o. who identifies as a female who was assigned female at birth, and is being seen today for concern for strep throat. Notes two of her grandchildren who have been with her are currently on treatment for strep throat. She now notes 2 days of sore throat, with white patches in throat and low-grade fever. Mild cough and nasal congestion but denies other URI symptoms. Denies any other known sick contact.  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   Angioedema 08/04/2014   GERD (gastroesophageal reflux disease) 08/10/2013   Other and unspecified hyperlipidemia 06/23/2013   Essential hypertension 07/23/2012    Allergies:  Allergies  Allergen Reactions   Coconut (Cocos Nucifera)  Hives   Codeine Other (See Comments)    Upsets stomach really bad   Shrimp [Shellfish Allergy] Hives   Trandolapril Swelling    Swelling of the face/lips 07/2014   Medications:  Current Outpatient Medications:    azithromycin (ZITHROMAX) 250 MG tablet, Take 2 tablets on day 1, then 1 tablet daily on days 2 through 5, Disp: 6 tablet, Rfl: 0    albuterol (VENTOLIN HFA) 108 (90 Base) MCG/ACT inhaler, Inhale 1-2 puffs into the lungs every 6 (six) hours as needed for wheezing or shortness of breath., Disp: 6.7 g, Rfl: 0   amLODipine (NORVASC) 10 MG tablet, TAKE 1 TABLET(10 MG) BY MOUTH DAILY, Disp: 90 tablet, Rfl: 0   aspirin EC 81 MG tablet, Take 81 mg by mouth daily., Disp: , Rfl:    atorvastatin (LIPITOR) 40 MG tablet, TAKE 1 TABLET BY MOUTH DAILY, Disp: 90 tablet, Rfl: 0   omeprazole (PRILOSEC) 40 MG capsule, Take 1 capsule (40 mg total) by mouth daily., Disp: 90 capsule, Rfl: 0   ondansetron (ZOFRAN) 4 MG tablet, Take 1 tablet (4 mg total) by mouth every 6 (six) hours., Disp: 12 tablet, Rfl: 0   potassium chloride (KLOR-CON M) 10 MEQ tablet, TAKE 1 TABLET(10 MEQ) BY MOUTH TWICE DAILY, Disp: 180 tablet, Rfl: 0   sucralfate (CARAFATE) 1 g tablet, TAKE 1 TABLET(1 GRAM) BY MOUTH FOUR TIMES DAILY AS NEEDED FOR STOMACH ACID, Disp: 120 tablet, Rfl: 3  Observations/Objective: Patient is well-developed, well-nourished in no acute distress.  Resting comfortably at home.  Head is normocephalic, atraumatic.  No labored breathing. Speech is clear and coherent with logical content.  Patient is alert and oriented at baseline.   Assessment and Plan: 1. Streptococcus exposure - azithromycin (ZITHROMAX) 250 MG tablet; Take 2 tablets on day 1, then 1 tablet daily on days 2 through 5  Dispense: 6 tablet; Refill: 0  2. Exudative pharyngitis - azithromycin (ZITHROMAX) 250 MG tablet; Take 2 tablets on day 1, then 1 tablet daily on days 2 through 5  Dispense: 6 tablet; Refill: 0  Exposure x 2 to strep throat. Does have mild cough which can be from the throat irritation itself. Supportive measures and OTC medications reviewed. Start Azithromycin per orders.  Follow Up Instructions: I discussed the assessment and treatment plan with the patient. The patient was provided an opportunity to ask questions and all were answered. The patient agreed with the  plan and demonstrated an understanding of the instructions.  A copy of instructions were sent to the patient via MyChart unless otherwise noted below.   The patient was advised to call back or seek an in-person evaluation if the symptoms worsen or if the condition fails to improve as anticipated.  Time:  I spent 10 minutes with the patient via telehealth technology discussing the above problems/concerns.    Christina Climes, PA-C

## 2022-10-24 NOTE — Patient Instructions (Signed)
Christina [name], thank you for joining [name], PA-C for today's virtual visit.  While this provider is not your primary care provider (PCP), if your PCP is located in our provider database this encounter information will be shared with them immediately following your visit.   A [facility] MyChart account gives you access to today's visit and all your visits, tests, and labs performed at [facility] " click here if you don't have a [facility] MyChart account or go to [email or web address]  Consent: (Patient) Christina [name] provided verbal consent for this virtual visit at the beginning of the encounter.  Current Medications:  Current Outpatient Medications:    albuterol (VENTOLIN HFA) 108 (90 Base) MCG/ACT inhaler, Inhale 1-2 puffs into the lungs every 6 (six) hours as needed for wheezing or shortness of breath., Disp: 6.7 g, Rfl: 0   amLODipine (NORVASC) 10 MG tablet, TAKE 1 TABLET(10 MG) BY MOUTH DAILY, Disp: 90 tablet, Rfl: 0   aspirin EC 81 MG tablet, Take 81 mg by mouth daily., Disp: , Rfl:    atorvastatin (LIPITOR) 40 MG tablet, TAKE 1 TABLET BY MOUTH DAILY, Disp: 90 tablet, Rfl: 0   benzonatate (TESSALON) 200 MG capsule, Take 1 capsule (200 mg total) by mouth 2 (two) times daily as needed for cough., Disp: 20 capsule, Rfl: 0   benzonatate (TESSALON) 200 MG capsule, Take 1 capsule (200 mg total) by mouth 2 (two) times daily as needed for cough., Disp: 20 capsule, Rfl: 0   fluticasone (FLONASE) 50 MCG/ACT nasal spray, Place 2 sprays into both nostrils daily., Disp: 16 g, Rfl: 6   omeprazole (PRILOSEC) 40 MG capsule, Take 1 capsule (40 mg total) by mouth daily., Disp: 90 capsule, Rfl: 0   ondansetron (ZOFRAN) 4 MG tablet, Take 1 tablet (4 mg total) by mouth every 6 (six) hours., Disp: 12 tablet, Rfl: 0   potassium chloride (KLOR-CON M) 10 MEQ tablet, TAKE 1 TABLET(10 MEQ) BY MOUTH TWICE DAILY, Disp: 180 tablet, Rfl: 0   predniSONE (DELTASONE) 20 MG tablet,  Take 2 tablets (40 mg total) by mouth daily., Disp: 8 tablet, Rfl: 0   sucralfate (CARAFATE) 1 g tablet, TAKE 1 TABLET(1 GRAM) BY MOUTH FOUR TIMES DAILY AS NEEDED FOR STOMACH ACID, Disp: 120 tablet, Rfl: 3   Medications ordered in this encounter:  No orders of the defined types were placed in this encounter.    *If you need refills on other medications prior to your next appointment, please contact your pharmacy*  Follow-Up: Call back or seek an in-person evaluation if the symptoms worsen or if the condition fails to improve as anticipated.  [facility] [phone]  Other Instructions Please keep well-hydrated and get plenty of rest. Start a saline nasal rinse and salt-water gargles. Take the antibiotic as directed. Tylenol if needed for throat pain. Mucinex for any mild cough. Follow-up in person if symptoms are not resolving or you note any new/worsening symptoms.    If you have been instructed to have an in-person evaluation today at a local Urgent Care facility, please use the link below. It will take you to a list of all of our available [facility], including address, phone number and hours of operation. Please do not delay care.  [facility]  If you or a family member do not have a primary care provider, use the link below to schedule a visit and establish care. When you choose a [facility] primary care  physician or advanced practice provider, you gain a long-term partner in health. Find a Primary Care Provider  Learn more about [facility] in-office and virtual care options: [facility]

## 2022-10-25 ENCOUNTER — Other Ambulatory Visit: Payer: Self-pay | Admitting: Family

## 2022-10-25 DIAGNOSIS — J3089 Other allergic rhinitis: Secondary | ICD-10-CM

## 2022-10-25 DIAGNOSIS — R062 Wheezing: Secondary | ICD-10-CM

## 2022-10-25 NOTE — Telephone Encounter (Signed)
Requested Prescriptions  Pending Prescriptions Disp Refills   albuterol (VENTOLIN HFA) 108 (90 Base) MCG/ACT inhaler [Pharmacy Med Name: ALBUTEROL HFA INH (200 PUFFS) 6.7GM] 6.7 g 0    Sig: INHALE 1 TO 2 PUFFS INTO THE LUNGS EVERY 6 HOURS AS NEEDED FOR WHEEZING OR SHORTNESS OF BREATH     Pulmonology:  Beta Agonists 2 Failed - 10/25/2022  3:44 AM      Failed - Last BP in normal range    BP Readings from Last 1 Encounters:  10/02/22 (!) 149/89         Passed - Last Heart Rate in normal range    Pulse Readings from Last 1 Encounters:  10/02/22 88         Passed - Valid encounter within last 12 months    Recent Outpatient Visits           1 month ago Primary hypertension   [place] [facility], [place], [place]   4 months ago Primary hypertension   [place] [facility] at [facility], [place], [place]   7 months ago Annual physical exam   [place] [facility], Amy J, NP   10 months ago Essential (primary) hypertension   [place] [facility], [place], [place]   1 year ago Essential (primary) hypertension    [facility] Care at [facility], [place], [place]       Future Appointments             In 5 months Rema Fendt, NP [facility] at [facility]

## 2022-11-14 ENCOUNTER — Other Ambulatory Visit: Payer: Self-pay | Admitting: Family

## 2022-11-14 DIAGNOSIS — I10 Essential (primary) hypertension: Secondary | ICD-10-CM

## 2022-11-14 NOTE — Telephone Encounter (Signed)
Complete

## 2022-11-18 ENCOUNTER — Other Ambulatory Visit: Payer: Self-pay | Admitting: Family

## 2022-11-18 DIAGNOSIS — J3089 Other allergic rhinitis: Secondary | ICD-10-CM

## 2022-11-18 DIAGNOSIS — R062 Wheezing: Secondary | ICD-10-CM

## 2022-12-02 ENCOUNTER — Other Ambulatory Visit: Payer: Self-pay | Admitting: Family

## 2022-12-02 DIAGNOSIS — R062 Wheezing: Secondary | ICD-10-CM

## 2022-12-02 DIAGNOSIS — J3089 Other allergic rhinitis: Secondary | ICD-10-CM

## 2022-12-03 ENCOUNTER — Other Ambulatory Visit: Payer: Self-pay | Admitting: Family

## 2022-12-03 DIAGNOSIS — J3089 Other allergic rhinitis: Secondary | ICD-10-CM

## 2022-12-03 DIAGNOSIS — R062 Wheezing: Secondary | ICD-10-CM

## 2022-12-04 ENCOUNTER — Other Ambulatory Visit: Payer: Self-pay | Admitting: Family

## 2022-12-04 ENCOUNTER — Telehealth: Payer: Self-pay | Admitting: Family

## 2022-12-04 DIAGNOSIS — J3089 Other allergic rhinitis: Secondary | ICD-10-CM

## 2022-12-04 DIAGNOSIS — R062 Wheezing: Secondary | ICD-10-CM

## 2022-12-04 NOTE — Telephone Encounter (Signed)
Pt is calling in because she wants to know why her refill request for albuterol (VENTOLIN HFA) 108 (90 Base) MCG/ACT inhaler [[id number]] was denied. Pt would like someone to call her with this information.

## 2022-12-04 NOTE — Telephone Encounter (Signed)
Requested Prescriptions  Pending Prescriptions Disp Refills   albuterol (VENTOLIN HFA) 108 (90 Base) MCG/ACT inhaler [Pharmacy Med Name: ALBUTEROL HFA INH (200 PUFFS) 6.7GM] 6.7 g 0    Sig: INHALE 1 TO 2 PUFFS INTO THE LUNGS EVERY 6 HOURS AS NEEDED FOR WHEEZING OR SHORTNESS OF BREATH     Pulmonology:  Beta Agonists 2 Failed - 12/03/2022  2:49 PM      Failed - Last BP in normal range    BP Readings from Last 1 Encounters:  10/02/22 (!) 149/89         Passed - Last Heart Rate in normal range    Pulse Readings from Last 1 Encounters:  10/02/22 88         Passed - Valid encounter within last 12 months    Recent Outpatient Visits           2 months ago Primary hypertension   [place] [facility] at [facility], [place], [place]   5 months ago Primary hypertension   [place] [facility] at [facility], [place], [place]   8 months ago Annual physical exam   [facility] at [facility], Amy J, NP   12 months ago Essential (primary) hypertension   [place] [facility] at [facility], [place], [place]   1 year ago Essential (primary) hypertension   [place] [facility] at [facility], [place], [place]       Future Appointments             In 3 months Rema Fendt, NP [facility] at [facility]

## 2022-12-05 ENCOUNTER — Other Ambulatory Visit: Payer: Self-pay | Admitting: Family

## 2022-12-05 DIAGNOSIS — R062 Wheezing: Secondary | ICD-10-CM

## 2022-12-05 DIAGNOSIS — J3089 Other allergic rhinitis: Secondary | ICD-10-CM

## 2022-12-05 NOTE — Telephone Encounter (Signed)
Pt reports that the albuterol (VENTOLIN HFA) 108 (90 Base) MCG/ACT inhaler  that she received is not working and the pharmacy advised her to contact the manufacturer to replace it. Pt does not want to contact manufacturer and requests that another Rx be sent to  [id number] Ginette Otto, [place] - [address] AT [facility] [address] Phone: [phone]  Fax: [phone]

## 2022-12-05 NOTE — Telephone Encounter (Signed)
Requested medication (s) are due for refill today: no  Requested medication (s) are on the active medication list: yes    Last refill: 11/18/22  6.7g   0 refills  Future visit scheduled yes 03/24/23  Notes to clinic:    Patient request another refill for albuterol inhaler due to malfunction of last inhaler refilled    Requested Prescriptions  Pending Prescriptions Disp Refills   albuterol (VENTOLIN HFA) 108 (90 Base) MCG/ACT inhaler 6.7 g 0     Pulmonology:  Beta Agonists 2 Failed - 12/05/2022  3:31 PM      Failed - Last BP in normal range    BP Readings from Last 1 Encounters:  10/02/22 (!) 149/89         Passed - Last Heart Rate in normal range    Pulse Readings from Last 1 Encounters:  10/02/22 88         Passed - Valid encounter within last 12 months    Recent Outpatient Visits           2 months ago Primary hypertension   [place] [facility] at [facility], [place]   5 months ago Primary hypertension   [place] [facility] at [facility], [place], [place]   8 months ago Annual physical exam   [place] [facility] at [facility], Amy J, NP   1 year ago Essential (primary) hypertension   [place] [facility] at [facility], [place], [place]   1 year ago Essential (primary) hypertension    Primary Care at [facility], [place], [place]       Future Appointments             In 3 months Rema Fendt, NP [facility] at [facility]

## 2022-12-05 NOTE — Telephone Encounter (Signed)
Patient request another refill for albuterol inhaler due to malfunction from the last one she had.

## 2022-12-16 ENCOUNTER — Telehealth: Payer: Self-pay | Admitting: Family

## 2022-12-16 ENCOUNTER — Other Ambulatory Visit: Payer: Self-pay | Admitting: Family

## 2022-12-16 DIAGNOSIS — R062 Wheezing: Secondary | ICD-10-CM

## 2022-12-16 DIAGNOSIS — J3089 Other allergic rhinitis: Secondary | ICD-10-CM

## 2022-12-16 NOTE — Telephone Encounter (Signed)
[facility] called and spoke to [employer] consultant about the refill(s) Albuterol inhaler requested. Advised it was sent on 11/19/22 #6.7 g/0 refill(s). She says the patient picked up the same day. Patient requesting refill.  Requested Prescriptions  Pending Prescriptions Disp Refills   albuterol (VENTOLIN HFA) 108 (90 Base) MCG/ACT inhaler [Pharmacy Med Name: ALBUTEROL HFA INH (200 PUFFS) 6.7GM] 6.7 g 0    Sig: INHALE 1 TO 2 PUFFS INTO THE LUNGS EVERY 6 HOURS AS NEEDED FOR WHEEZING OR SHORTNESS OF BREATH     Pulmonology:  Beta Agonists 2 Failed - 12/16/2022  2:07 PM      Failed - Last BP in normal range    BP Readings from Last 1 Encounters:  10/02/22 (!) 149/89         Passed - Last Heart Rate in normal range    Pulse Readings from Last 1 Encounters:  10/02/22 88         Passed - Valid encounter within last 12 months    Recent Outpatient Visits           2 months ago Primary hypertension   [place] [facility], [place], [place]   5 months ago Primary hypertension   [place] [facility] at [facility], [place]   9 months ago Annual physical exam   [place] [facility] at [facility], Amy J, NP   1 year ago Essential (primary) hypertension   [place] [facility] at [facility], [place], [place]   1 year ago Essential (primary) hypertension   [place] [facility] at [facility], [place], [place]       Future Appointments             In 3 months Rema Fendt, NP [facility] at [facility]

## 2022-12-16 NOTE — Telephone Encounter (Signed)
[facility] called and spoke to [employer] consultant about the refill(s) Albuterol inhaler requested. Advised it was sent on 11/19/22 #6.7 g/0 refill(s). She says the patient picked up the same day. Patient requesting refill. Already requested today by the pharmacy in a refill encounter, pending approval.

## 2022-12-16 NOTE — Telephone Encounter (Signed)
Medication Refill - Medication: albuterol (VENTOLIN HFA) 108 (90 Base) MCG/ACT inhaler   Has the patient contacted their pharmacy? Yes.   They never recvd the refill request  Preferred Pharmacy (with phone number or street name):  [id number] [place], [place] - [address] AT [facility] OF [facility] & [address] Phone: [phone]  Fax: [phone]     Has the patient been seen for an appointment in the last year OR does the patient have an upcoming appointment? No.  Agent: Please be advised that RX refills may take up to 3 business days. We ask that you follow-up with your pharmacy.

## 2022-12-25 NOTE — Telephone Encounter (Signed)
Spoke to pt states that albuterol inhaler was refilled

## 2023-01-10 ENCOUNTER — Other Ambulatory Visit: Payer: Self-pay

## 2023-01-10 DIAGNOSIS — K219 Gastro-esophageal reflux disease without esophagitis: Secondary | ICD-10-CM

## 2023-01-10 MED ORDER — SUCRALFATE 1 G PO TABS
ORAL_TABLET | ORAL | 3 refills | Status: DC
Start: 2023-01-10 — End: 2023-03-24

## 2023-01-13 ENCOUNTER — Other Ambulatory Visit: Payer: Self-pay

## 2023-01-13 DIAGNOSIS — E785 Hyperlipidemia, unspecified: Secondary | ICD-10-CM

## 2023-01-13 MED ORDER — ATORVASTATIN CALCIUM 40 MG PO TABS
ORAL_TABLET | ORAL | 0 refills | Status: DC
Start: 2023-01-13 — End: 2023-03-03

## 2023-01-14 ENCOUNTER — Other Ambulatory Visit: Payer: Self-pay

## 2023-01-14 DIAGNOSIS — I10 Essential (primary) hypertension: Secondary | ICD-10-CM

## 2023-01-14 MED ORDER — AMLODIPINE BESYLATE 10 MG PO TABS
ORAL_TABLET | ORAL | 0 refills | Status: DC
Start: 2023-01-14 — End: 2023-03-11

## 2023-02-12 ENCOUNTER — Other Ambulatory Visit: Payer: Self-pay | Admitting: Family

## 2023-02-12 DIAGNOSIS — I10 Essential (primary) hypertension: Secondary | ICD-10-CM

## 2023-02-13 NOTE — Telephone Encounter (Signed)
Requested Prescriptions  Pending Prescriptions Disp Refills   potassium chloride (KLOR-CON M) 10 MEQ tablet [Pharmacy Med Name: POTASSIUM CL MICRO ER TABS] 180 tablet 0    Sig: TAKE 1 TABLET(10 MEQ) BY MOUTH TWICE DAILY     Endocrinology:  Minerals - Potassium Supplementation Failed - 02/12/2023  7:01 AM      Failed - Cr in normal range and within 360 days    Creatinine  Date Value Ref Range Status  09/30/2022 1.08 (H) 0.44 - 1.00 mg/dL Final   Creat  Date Value Ref Range Status  03/22/2016 0.87 0.50 - 0.99 mg/dL Final    Comment:      For patients > or = 75 years of age: The upper reference limit for Creatinine is approximately 13% higher for people identified as African-American.            Passed - K in normal range and within 360 days    Potassium  Date Value Ref Range Status  09/30/2022 3.7 3.5 - 5.1 mmol/L Final         Passed - Valid encounter within last 12 months    Recent Outpatient Visits           4 months ago Primary hypertension   [facility], [place], NP   7 months ago Primary hypertension   [facility], [place], [place]   11 months ago Annual physical exam   [facility], Amy J, NP   1 year ago Essential (primary) hypertension   [facility], [place], [place]   1 year ago Essential (primary) hypertension   [facility], [place], [place]       Future Appointments             In 1 month Rema Fendt, NP [facility]

## 2023-03-02 ENCOUNTER — Other Ambulatory Visit: Payer: Self-pay | Admitting: Family

## 2023-03-02 DIAGNOSIS — E785 Hyperlipidemia, unspecified: Secondary | ICD-10-CM

## 2023-03-03 ENCOUNTER — Telehealth: Payer: Commercial Managed Care - PPO | Admitting: Physician Assistant

## 2023-03-03 DIAGNOSIS — J019 Acute sinusitis, unspecified: Secondary | ICD-10-CM

## 2023-03-03 DIAGNOSIS — R051 Acute cough: Secondary | ICD-10-CM

## 2023-03-03 DIAGNOSIS — B9689 Other specified bacterial agents as the cause of diseases classified elsewhere: Secondary | ICD-10-CM | POA: Diagnosis not present

## 2023-03-03 MED ORDER — AMOXICILLIN-POT CLAVULANATE 875-125 MG PO TABS
1.0000 | ORAL_TABLET | Freq: Two times a day (BID) | ORAL | 0 refills | Status: DC
Start: 2023-03-03 — End: 2023-06-24

## 2023-03-03 MED ORDER — BENZONATATE 100 MG PO CAPS
100.0000 mg | ORAL_CAPSULE | Freq: Three times a day (TID) | ORAL | 0 refills | Status: DC | PRN
Start: 2023-03-03 — End: 2023-07-05

## 2023-03-03 NOTE — Progress Notes (Signed)
Virtual Visit Consent   Christina [name], you are scheduled for a virtual visit with a [facility] provider today. Just as with appointments in the office, your consent must be obtained to participate. Your consent will be active for this visit and any virtual visit you may have with one of our providers in the next 365 days. If you have a MyChart account, a copy of this consent can be sent to you electronically.  As this is a virtual visit, video technology does not allow for your provider to perform a traditional examination. This may limit your provider's ability to fully assess your condition. If your provider identifies any concerns that need to be evaluated in person or the need to arrange testing (such as labs, EKG, etc.), we will make arrangements to do so. Although advances in technology are sophisticated, we cannot ensure that it will always work on either your end or our end. If the connection with a video visit is poor, the visit may have to be switched to a telephone visit. With either a video or telephone visit, we are not always able to ensure that we have a secure connection.  By engaging in this virtual visit, you consent to the provision of healthcare and authorize for your insurance to be billed (if applicable) for the services provided during this visit. Depending on your insurance coverage, you may receive a charge related to this service.  I need to obtain your verbal consent now. Are you willing to proceed with your visit today? [name] has provided verbal consent on 03/03/2023 for a virtual visit (video or telephone). Margaretann Loveless, PA-C  Date: 03/03/2023 2:35 PM  Virtual Visit via Video Note   I, Margaretann Loveless, connected with  Christina [name]  ([id number], 1946/02/26) on 03/03/23 at  2:30 PM EDT by a video-enabled telemedicine application and verified that I am speaking with the correct person using two identifiers.  Location: Patient: Virtual Visit Location  Patient: Home Provider: Virtual Visit Location Provider: Home Office   I discussed the limitations of evaluation and management by telemedicine and the availability of in person appointments. The patient expressed understanding and agreed to proceed.    History of Present Illness: Christina [name] is a 77 y.o. who identifies as a female who was assigned female at birth, and is being seen today for head and chest congestion.  HPI: URI  This is a new problem. The current episode started 1 to 4 weeks ago (1.5 weeks). The problem has been gradually worsening. The maximum temperature recorded prior to her arrival was 101 - 101.9 F. The fever has been present for Less than 1 day. Associated symptoms include congestion, coughing, headaches, a plugged ear sensation, sinus pain and a sore throat (started yesterday). Pertinent negatives include no diarrhea, ear pain, nausea, rhinorrhea or wheezing. Treatments tried: mucinex. The treatment provided no relief.     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  Angioedema 08/04/2014   GERD (gastroesophageal reflux disease) 08/10/2013   Other and unspecified hyperlipidemia 06/23/2013   Essential hypertension 07/23/2012    Allergies:  Allergies  Allergen Reactions   Coconut (Cocos Nucifera) Hives   Codeine Other (See Comments)    Upsets stomach really bad   Shrimp [Shellfish Allergy] Hives    Trandolapril Swelling    Swelling of the face/lips 07/2014   Medications:  Current Outpatient Medications:    amoxicillin-clavulanate (AUGMENTIN) 875-125 MG tablet, Take 1 tablet by mouth 2 (two) times daily., Disp: 20 tablet, Rfl: 0   benzonatate (TESSALON) 100 MG capsule, Take 1-2 capsules (100-200 mg total) by mouth 3 (three) times daily as needed., Disp: 30 capsule, Rfl: 0   albuterol (VENTOLIN HFA) 108 (90 Base) MCG/ACT inhaler, INHALE 1 TO 2 PUFFS INTO THE LUNGS EVERY 6 HOURS AS NEEDED FOR WHEEZING OR SHORTNESS OF BREATH, Disp: 6.7 g, Rfl: 3   amLODipine (NORVASC) 10 MG tablet, TAKE 1 TABLET(10 MG) BY MOUTH DAILY, Disp: 30 tablet, Rfl: 0   aspirin EC 81 MG tablet, Take 81 mg by mouth daily., Disp: , Rfl:    atorvastatin (LIPITOR) 40 MG tablet, TAKE 1 TABLET BY MOUTH DAILY, Disp: 90 tablet, Rfl: 0   omeprazole (PRILOSEC) 40 MG capsule, Take 1 capsule (40 mg total) by mouth daily., Disp: 90 capsule, Rfl: 0   ondansetron (ZOFRAN) 4 MG tablet, Take 1 tablet (4 mg total) by mouth every 6 (six) hours., Disp: 12 tablet, Rfl: 0   potassium chloride (KLOR-CON M) 10 MEQ tablet, TAKE 1 TABLET(10 MEQ) BY MOUTH TWICE DAILY, Disp: 180 tablet, Rfl: 0   sucralfate (CARAFATE) 1 g tablet, TAKE 1 TABLET(1 GRAM) BY MOUTH FOUR TIMES DAILY AS NEEDED FOR STOMACH ACID, Disp: 120 tablet, Rfl: 3  Observations/Objective: Patient is well-developed, well-nourished in no acute distress.  Resting comfortably at home.  Head is normocephalic, atraumatic.  No labored breathing.  Speech is clear and coherent with logical content.  Patient is alert and oriented at baseline.    Assessment and Plan: 1. Acute bacterial sinusitis - amoxicillin-clavulanate (AUGMENTIN) 875-125 MG tablet; Take 1 tablet by mouth 2 (two) times daily.  Dispense: 20 tablet; Refill: 0  2. Acute cough - benzonatate (TESSALON) 100 MG capsule; Take 1-2 capsules (100-200 mg total) by mouth 3 (three) times daily as needed.  Dispense: 30 capsule;  Refill: 0  - Worsening symptoms that have not responded to OTC medications.  - Will give Augmentin and Tessalon perles - Continue allergy medications.  - Steam and humidifier can help - Stay well hydrated and get plenty of rest.  - Seek in person evaluation if no symptom improvement or if symptoms worsen   Follow Up Instructions: I discussed the assessment and treatment plan with the patient. The patient was provided an opportunity to ask questions and all were answered. The patient agreed with the plan and demonstrated an understanding of the instructions.  A copy of instructions were sent to the patient via MyChart unless otherwise noted below.    The patient was advised to call back or seek an in-person evaluation if the symptoms worsen or if the condition fails to improve as anticipated.  Time:  I spent 10 minutes with the patient via telehealth technology discussing the above problems/concerns.    Margaretann Loveless, PA-C

## 2023-03-03 NOTE — Patient Instructions (Signed)
Christina [name], thank you for joining Margaretann Loveless, PA-C for today's virtual visit.  While this provider is not your primary care provider (PCP), if your PCP is located in our provider database this encounter information will be shared with them immediately following your visit.   A [facility] MyChart account gives you access to today's visit and all your visits, tests, and labs performed at [facility] " click here if you don't have a [facility] MyChart account or go to [email or web address]  Consent: (Patient) Christina [name] provided verbal consent for this virtual visit at the beginning of the encounter.  Current Medications:  Current Outpatient Medications:    amoxicillin-clavulanate (AUGMENTIN) 875-125 MG tablet, Take 1 tablet by mouth 2 (two) times daily., Disp: 20 tablet, Rfl: 0   benzonatate (TESSALON) 100 MG capsule, Take 1-2 capsules (100-200 mg total) by mouth 3 (three) times daily as needed., Disp: 30 capsule, Rfl: 0   albuterol (VENTOLIN HFA) 108 (90 Base) MCG/ACT inhaler, INHALE 1 TO 2 PUFFS INTO THE LUNGS EVERY 6 HOURS AS NEEDED FOR WHEEZING OR SHORTNESS OF BREATH, Disp: 6.7 g, Rfl: 3   amLODipine (NORVASC) 10 MG tablet, TAKE 1 TABLET(10 MG) BY MOUTH DAILY, Disp: 30 tablet, Rfl: 0   aspirin EC 81 MG tablet, Take 81 mg by mouth daily., Disp: , Rfl:    atorvastatin (LIPITOR) 40 MG tablet, TAKE 1 TABLET BY MOUTH DAILY, Disp: 90 tablet, Rfl: 0   omeprazole (PRILOSEC) 40 MG capsule, Take 1 capsule (40 mg total) by mouth daily., Disp: 90 capsule, Rfl: 0   ondansetron (ZOFRAN) 4 MG tablet, Take 1 tablet (4 mg total) by mouth every 6 (six) hours., Disp: 12 tablet, Rfl: 0   potassium chloride (KLOR-CON M) 10 MEQ tablet, TAKE 1 TABLET(10 MEQ) BY MOUTH TWICE DAILY, Disp: 180 tablet, Rfl: 0   sucralfate (CARAFATE) 1 g tablet, TAKE 1 TABLET(1 GRAM) BY MOUTH FOUR TIMES DAILY AS NEEDED FOR STOMACH ACID, Disp: 120 tablet, Rfl: 3   Medications ordered in this  encounter:  Meds ordered this encounter  Medications   amoxicillin-clavulanate (AUGMENTIN) 875-125 MG tablet    Sig: Take 1 tablet by mouth 2 (two) times daily.    Dispense:  20 tablet    Refill:  0    Order Specific Question:   Supervising Provider    Answer:   Merrilee Jansky [[id number]]   benzonatate (TESSALON) 100 MG capsule    Sig: Take 1-2 capsules (100-200 mg total) by mouth 3 (three) times daily as needed.    Dispense:  30 capsule    Refill:  0    Order Specific Question:   Supervising Provider    Answer:   Merrilee Jansky [id number]     *If you need refills on other medications prior to your next appointment, please contact your pharmacy*  Follow-Up: Call back or seek an in-person evaluation if the symptoms worsen or if the condition fails to improve as anticipated.  [facility] [phone]  Other Instructions Sinus Infection, Adult A sinus infection, also called sinusitis, is inflammation of your sinuses. Sinuses are hollow spaces in the bones around your face. Your sinuses are located: Around your eyes. In the middle of your forehead. Behind your nose. In your cheekbones. Mucus normally drains out of your sinuses. When your nasal tissues become inflamed or swollen, mucus can become trapped or blocked. This allows bacteria, viruses, and fungi to grow, which leads to infection. Most infections of the  sinuses are caused by a virus. A sinus infection can develop quickly. It can last for up to 4 weeks (acute) or for more than 12 weeks (chronic). A sinus infection often develops after a cold. What are the causes? This condition is caused by anything that creates swelling in the sinuses or stops mucus from draining. This includes: Allergies. Asthma. Infection from bacteria or viruses. Deformities or blockages in your nose or sinuses. Abnormal growths in the nose (nasal polyps). Pollutants, such as chemicals or irritants in the air. Infection from fungi.  This is rare. What increases the risk? You are more likely to develop this condition if you: Have a weak body defense system (immune system). Do a lot of swimming or diving. Overuse nasal sprays. Smoke. What are the signs or symptoms? The main symptoms of this condition are pain and a feeling of pressure around the affected sinuses. Other symptoms include: Stuffy nose or congestion that makes it difficult to breathe through your nose. Thick yellow or greenish drainage from your nose. Tenderness, swelling, and warmth over the affected sinuses. A cough that may get worse at night. Decreased sense of smell and taste. Extra mucus that collects in the throat or the back of the nose (postnasal drip) causing a sore throat or bad breath. Tiredness (fatigue). Fever. How is this diagnosed? This condition is diagnosed based on: Your symptoms. Your medical history. A physical exam. Tests to find out if your condition is acute or chronic. This may include: Checking your nose for nasal polyps. Viewing your sinuses using a device that has a light (endoscope). Testing for allergies or bacteria. Imaging tests, such as an MRI or CT scan. In rare cases, a bone biopsy may be done to rule out more serious types of fungal sinus disease. How is this treated? Treatment for a sinus infection depends on the cause and whether your condition is chronic or acute. If caused by a virus, your symptoms should go away on their own within 10 days. You may be given medicines to relieve symptoms. They include: Medicines that shrink swollen nasal passages (decongestants). A spray that eases inflammation of the nostrils (topical intranasal corticosteroids). Rinses that help get rid of thick mucus in your nose (nasal saline washes). Medicines that treat allergies (antihistamines). Over-the-counter pain relievers. If caused by bacteria, your health care provider may recommend waiting to see if your symptoms improve. Most  bacterial infections will get better without antibiotic medicine. You may be given antibiotics if you have: A severe infection. A weak immune system. If caused by narrow nasal passages or nasal polyps, surgery may be needed. Follow these instructions at home: Medicines Take, use, or apply over-the-counter and prescription medicines only as told by your health care provider. These may include nasal sprays. If you were prescribed an antibiotic medicine, take it as told by your health care provider. Do not stop taking the antibiotic even if you start to feel better. Hydrate and humidify  Drink enough fluid to keep your urine pale yellow. Staying hydrated will help to thin your mucus. Use a cool mist humidifier to keep the humidity level in your home above 50%. Inhale steam for 10-15 minutes, 3-4 times a day, or as told by your health care provider. You can do this in the bathroom while a hot shower is running. Limit your exposure to cool or dry air. Rest Rest as much as possible. Sleep with your head raised (elevated). Make sure you get enough sleep each night. General instructions  Apply a warm, moist washcloth to your face 3-4 times a day or as told by your health care provider. This will help with discomfort. Use nasal saline washes as often as told by your health care provider. Wash your hands often with soap and water to reduce your exposure to germs. If soap and water are not available, use hand sanitizer. Do not smoke. Avoid being around people who are smoking (secondhand smoke). Keep all follow-up visits. This is important. Contact a health care provider if: You have a fever. Your symptoms get worse. Your symptoms do not improve within 10 days. Get help right away if: You have a severe headache. You have persistent vomiting. You have severe pain or swelling around your face or eyes. You have vision problems. You develop confusion. Your neck is stiff. You have trouble  breathing. These symptoms may be an emergency. Get help right away. Call 911. Do not wait to see if the symptoms will go away. Do not drive yourself to the hospital. Summary A sinus infection is soreness and inflammation of your sinuses. Sinuses are hollow spaces in the bones around your face. This condition is caused by nasal tissues that become inflamed or swollen. The swelling traps or blocks the flow of mucus. This allows bacteria, viruses, and fungi to grow, which leads to infection. If you were prescribed an antibiotic medicine, take it as told by your health care provider. Do not stop taking the antibiotic even if you start to feel better. Keep all follow-up visits. This is important. This information is not intended to replace advice given to you by your health care provider. Make sure you discuss any questions you have with your health care provider. Document Revised: 05/08/2021 Document Reviewed: 05/08/2021 Elsevier Patient Education  2024 Elsevier [employer]    If you have been instructed to have an in-person evaluation today at a local Urgent Care facility, please use the link below. It will take you to a list of all of our available [facility], including address, phone number and hours of operation. Please do not delay care.  [facility]  If you or a family member do not have a primary care provider, use the link below to schedule a visit and establish care. When you choose a [facility] primary care physician or advanced practice provider, you gain a long-term partner in health. Find a Primary Care Provider  Learn more about [facility]'s in-office and virtual care options: [place] - Get Care Now

## 2023-03-10 ENCOUNTER — Other Ambulatory Visit: Payer: Self-pay | Admitting: Family

## 2023-03-10 DIAGNOSIS — I10 Essential (primary) hypertension: Secondary | ICD-10-CM

## 2023-03-11 ENCOUNTER — Other Ambulatory Visit: Payer: Self-pay

## 2023-03-11 DIAGNOSIS — I10 Essential (primary) hypertension: Secondary | ICD-10-CM

## 2023-03-11 MED ORDER — AMLODIPINE BESYLATE 10 MG PO TABS
ORAL_TABLET | ORAL | 0 refills | Status: DC
Start: 2023-03-11 — End: 2023-06-24

## 2023-03-18 ENCOUNTER — Other Ambulatory Visit: Payer: Self-pay

## 2023-03-18 ENCOUNTER — Other Ambulatory Visit: Payer: Self-pay | Admitting: Family

## 2023-03-18 DIAGNOSIS — J3089 Other allergic rhinitis: Secondary | ICD-10-CM

## 2023-03-18 DIAGNOSIS — R062 Wheezing: Secondary | ICD-10-CM

## 2023-03-18 MED ORDER — ALBUTEROL SULFATE HFA 108 (90 BASE) MCG/ACT IN AERS: 2.0000 | INHALATION_SPRAY | Freq: Four times a day (QID) | RESPIRATORY_TRACT | 3 refills | Status: DC | PRN

## 2023-03-24 ENCOUNTER — Encounter: Payer: Self-pay | Admitting: Family

## 2023-03-24 ENCOUNTER — Ambulatory Visit: Payer: Commercial Managed Care - PPO | Admitting: Family

## 2023-03-24 VITALS — BP 128/89 | HR 94 | Resp 16 | Ht 68.0 in | Wt 175.8 lb

## 2023-03-24 DIAGNOSIS — E785 Hyperlipidemia, unspecified: Secondary | ICD-10-CM

## 2023-03-24 DIAGNOSIS — K219 Gastro-esophageal reflux disease without esophagitis: Secondary | ICD-10-CM

## 2023-03-24 DIAGNOSIS — R7303 Prediabetes: Secondary | ICD-10-CM | POA: Diagnosis not present

## 2023-03-24 DIAGNOSIS — I10 Essential (primary) hypertension: Secondary | ICD-10-CM

## 2023-03-24 DIAGNOSIS — Z23 Encounter for immunization: Secondary | ICD-10-CM | POA: Diagnosis not present

## 2023-03-24 MED ORDER — AMLODIPINE BESYLATE 10 MG PO TABS
ORAL_TABLET | ORAL | 0 refills | Status: DC
Start: 2023-03-24 — End: 2023-06-24

## 2023-03-24 MED ORDER — SUCRALFATE 1 G PO TABS: ORAL_TABLET | ORAL | 3 refills | Status: DC

## 2023-03-24 MED ORDER — ATORVASTATIN CALCIUM 40 MG PO TABS: ORAL_TABLET | ORAL | 0 refills | Status: DC

## 2023-03-24 MED ORDER — OMEPRAZOLE 40 MG PO CPDR
40.0000 mg | DELAYED_RELEASE_CAPSULE | Freq: Every day | ORAL | 0 refills | Status: DC
Start: 2023-03-24 — End: 2023-06-24

## 2023-03-24 NOTE — Progress Notes (Signed)
Patient ID: Christina [name], female    DOB: September 30, 1945  MRN: [id number]  CC: Chronic Conditions Follow-Up  Subjective: Christina [name] is a 77 y.o. female who presents for chronic conditions follow-up.   Her concerns today include:  - Doing well on Amlodipine, no issues/concerns. Home blood pressures at goal. She does not complain of red flag symptoms such as but not limited to chest pain, shortness of breath, worst headache of life, nausea/vomiting.  - Doing well on Atorvastatin, no issues/concerns.  - Doing well on Omeprazole and Carafate, no issues/concerns.    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) 07/06/2018   Goals of care, counseling/discussion 07/06/2018   Encounter for antineoplastic chemotherapy 07/06/2018   Primary cancer of right upper lobe of lung (HCC) 06/18/2018   Acute URI 05/25/2018   Seasonal allergies 10/06/2017   Thyromegaly 07/01/2017   Colon polyps 04/02/2017   Former smoker 09/27/2016   Smoker 09/27/2016   Cough 04/25/2016   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   Angioedema 08/04/2014   GERD (gastroesophageal reflux disease) 08/10/2013   Other and unspecified hyperlipidemia 06/23/2013   Essential hypertension 07/23/2012     Current Outpatient Medications on File Prior to Visit  Medication Sig Dispense Refill   albuterol (VENTOLIN HFA) 108 (90 Base) MCG/ACT inhaler INHALE 1 TO 2 PUFFS INTO THE LUNGS EVERY 6 HOURS AS NEEDED FOR WHEEZING OR SHORTNESS OF BREATH 6.7 g 3   albuterol (VENTOLIN HFA) 108 (90 Base) MCG/ACT inhaler Inhale 2 puffs into the lungs every 6 (six) hours as needed for wheezing or shortness of breath (1-2 Puffs as needed). 6.7 g 3   amLODipine (NORVASC) 10 MG tablet TAKE 1 TABLET(10 MG) BY MOUTH DAILY 30 tablet 0   amoxicillin-clavulanate (AUGMENTIN) 875-125 MG tablet Take 1 tablet  by mouth 2 (two) times daily. 20 tablet 0   aspirin EC 81 MG tablet Take 81 mg by mouth daily.     benzonatate (TESSALON) 100 MG capsule Take 1-2 capsules (100-200 mg total) by mouth 3 (three) times daily as needed. 30 capsule 0   ondansetron (ZOFRAN) 4 MG tablet Take 1 tablet (4 mg total) by mouth every 6 (six) hours. 12 tablet 0   potassium chloride (KLOR-CON M) 10 MEQ tablet TAKE 1 TABLET(10 MEQ) BY MOUTH TWICE DAILY 180 tablet 0   No current facility-administered medications on file prior to visit.    Allergies  Allergen Reactions   Coconut (Cocos Nucifera) Hives   Codeine Other (See Comments)    Upsets stomach really bad   Shrimp [Shellfish Allergy] Hives   Trandolapril Swelling    Swelling of the face/lips 07/2014    Social History   Socioeconomic History   Marital status: Married    Spouse name: Chrissie Noa   Number of children: 1   Years of education: 12th grade   Highest education level: Not on file  Occupational History   Occupation: school housekeeping    Comment: part-time  Tobacco Use   Smoking status: Former    Current packs/day: 0.50    Average packs/day: 0.5 packs/day for 50.0 years (25.0 ttl pk-yrs)    Types: Cigarettes    Passive exposure: Past   Smokeless tobacco: Never   Tobacco comments:    Previously quit in 2015 but has started smoking again   Vaping Use   Vaping status: Never Used  Substance and Sexual Activity   Alcohol use: No    Alcohol/week: 0.0 standard drinks of alcohol   Drug use: No   Sexual activity: Not Currently  Other Topics Concern   Not on file  Social History Narrative   Lives with her husband. Her daughter also lives in [place] with her husband and 3 children.   Social Determinants of Health   Financial Resource Strain: Not on file  Food Insecurity: Not on file  Transportation Needs: Not on file  Physical Activity: Not on file  Stress: Not on file  Social Connections: Unknown (02/01/2022)   Received from [facility],  [facility]   Social Network    Social Network: Not on file  Intimate Partner Violence: Unknown (02/01/2022)   Received from [facility], [facility]   HITS    Physically Hurt: Not on file    Insult or Talk Down To: Not on file    Threaten Physical Harm: Not on file    Scream or Curse: Not on file    Family History  Problem Relation Age of Onset   Hypertension Mother    Heart disease Mother    Leukemia Father    Cancer Father    Hypertension Sister    Hypertension Sister    Colon cancer Neg Hx    Colon polyps Neg Hx    Stomach cancer Neg Hx    Esophageal cancer Neg Hx     Past Surgical History:  Procedure Laterality Date   CESAREAN SECTION     1 time   COLONOSCOPY     POLYPECTOMY     VIDEO BRONCHOSCOPY WITH ENDOBRONCHIAL NAVIGATION N/A 06/24/2018   Procedure: VIDEO BRONCHOSCOPY WITH ENDOBRONCHIAL NAVIGATION with biopsies, right upper lung lobe;  Surgeon: Loreli Slot, MD;  Location: [facility] OR;  Service: Thoracic;  Laterality: N/A;   VIDEO BRONCHOSCOPY WITH ENDOBRONCHIAL ULTRASOUND N/A 06/24/2018   Procedure: VIDEO BRONCHOSCOPY WITH ENDOBRONCHIAL ULTRASOUND, right upper lung lobe;  Surgeon: Loreli Slot, MD;  Location: [facility] OR;  Service: Thoracic;  Laterality: N/A;    ROS: Review of Systems Negative except as stated above  PHYSICAL EXAM: BP 128/89   Pulse 94   Resp 16   Ht 5\' 8"  (1.727 m)   Wt 175 lb 12.8 oz (79.7 kg)   SpO2 92%   BMI 26.73 kg/m   Physical Exam HENT:     Head: Normocephalic and atraumatic.     Nose: Nose normal.     Mouth/Throat:     Mouth: Mucous membranes are moist.     Pharynx: Oropharynx is clear.  Eyes:     Extraocular Movements: Extraocular movements intact.     Conjunctiva/sclera: Conjunctivae normal.     Pupils: Pupils are equal, round, and reactive to light.  Cardiovascular:     Rate and Rhythm: Normal rate and regular rhythm.     Pulses: Normal pulses.     Heart sounds: Normal heart sounds.  Pulmonary:      Effort: Pulmonary effort is normal.     Breath sounds: Normal breath sounds.  Musculoskeletal:        General: Normal range of motion.     Cervical back: Normal range of motion and neck supple.  Neurological:     General: No focal deficit present.     Mental Status: She is alert and oriented to person, place, and time.  Psychiatric:        Mood and Affect: Mood normal.  Behavior: Behavior normal.     ASSESSMENT AND PLAN: 1. Primary hypertension - Continue Amlodipine as prescribed.  - Routine screening.  - Counseled on blood pressure goal of less than 140/90, low-sodium, DASH diet, medication compliance, 150 minutes of moderate intensity exercise per week as tolerated. Discussed medication compliance, adverse effects. - Follow-up with primary provider in 3 months or sooner if needed.  - Basic Metabolic Panel - amLODipine (NORVASC) 10 MG tablet; TAKE 1 TABLET(10 MG) BY MOUTH DAILY  Dispense: 90 tablet; Refill: 0  2. Hyperlipidemia, unspecified hyperlipidemia type - Continue Atorvastatin as prescribed. Counseled on medication adherence/adverse effects.  - Routine screening.  - Follow-up with primary provider as scheduled.  - Lipid panel - atorvastatin (LIPITOR) 40 MG tablet; TAKE 1 TABLET BY MOUTH DAILY  Dispense: 90 tablet; Refill: 0  3. Prediabetes - Routine screening.  - Hemoglobin A1c  4. Gastroesophageal reflux disease without esophagitis - Continue Omeprazole and Sucralfate as prescribed. Counseled on medication adherence/adverse effects.  - Follow-up with primary provider in 3 months or sooner if needed.  - sucralfate (CARAFATE) 1 g tablet; TAKE 1 TABLET(1 GRAM) BY MOUTH FOUR TIMES DAILY AS NEEDED FOR STOMACH ACID  Dispense: 120 tablet; Refill: 3 - omeprazole (PRILOSEC) 40 MG capsule; Take 1 capsule (40 mg total) by mouth daily.  Dispense: 90 capsule; Refill: 0  5. Encounter for immunization - Administered.  - Flu Vaccine Trivalent High Dose (Fluad)    Patient  was given the opportunity to ask questions.  Patient verbalized understanding of the plan and was able to repeat key elements of the plan. Patient was given clear instructions to go to Emergency Department or return to medical center if symptoms don't improve, worsen, or new problems develop.The patient verbalized understanding.   Orders Placed This Encounter  Procedures   Flu Vaccine Trivalent High Dose (Fluad)   Lipid panel   Basic Metabolic Panel   Hemoglobin A1c     Requested Prescriptions   Signed Prescriptions Disp Refills   amLODipine (NORVASC) 10 MG tablet 90 tablet 0    Sig: TAKE 1 TABLET(10 MG) BY MOUTH DAILY   atorvastatin (LIPITOR) 40 MG tablet 90 tablet 0    Sig: TAKE 1 TABLET BY MOUTH DAILY   sucralfate (CARAFATE) 1 g tablet 120 tablet 3    Sig: TAKE 1 TABLET(1 GRAM) BY MOUTH FOUR TIMES DAILY AS NEEDED FOR STOMACH ACID   omeprazole (PRILOSEC) 40 MG capsule 90 capsule 0    Sig: Take 1 capsule (40 mg total) by mouth daily.    Return in about 3 months (around 06/24/2023) for Follow-Up or next available chronic conditions.  Rema Fendt, NP

## 2023-03-25 ENCOUNTER — Other Ambulatory Visit: Payer: Self-pay | Admitting: Family

## 2023-03-25 DIAGNOSIS — Z13228 Encounter for screening for other metabolic disorders: Secondary | ICD-10-CM

## 2023-03-25 LAB — HEMOGLOBIN A1C
Est. average glucose Bld gHb Est-mCnc: 126 mg/dL
Hgb A1c MFr Bld: 6 % — ABNORMAL HIGH (ref 4.8–5.6)

## 2023-03-25 LAB — LIPID PANEL
Chol/HDL Ratio: 3.2 {ratio} (ref 0.0–4.4)
Cholesterol, Total: 174 mg/dL (ref 100–199)
HDL: 54 mg/dL (ref >39)
LDL Chol Calc (NIH): 103 mg/dL — ABNORMAL HIGH (ref 0–99)
Triglycerides: 91 mg/dL (ref 0–149)
VLDL Cholesterol Cal: 17 mg/dL (ref 5–40)

## 2023-03-25 LAB — BASIC METABOLIC PANEL
BUN/Creatinine Ratio: 15 (ref 12–28)
BUN: 16 mg/dL (ref 8–27)
CO2: 23 mmol/L (ref 20–29)
Calcium: 10 mg/dL (ref 8.7–10.3)
Chloride: 103 mmol/L (ref 96–106)
Creatinine, Ser: 1.07 mg/dL — ABNORMAL HIGH (ref 0.57–1.00)
Glucose: 85 mg/dL (ref 70–99)
Potassium: 4.7 mmol/L (ref 3.5–5.2)
Sodium: 142 mmol/L (ref 134–144)
eGFR: 54 mL/min/{1.73_m2} — ABNORMAL LOW (ref >59)

## 2023-04-01 ENCOUNTER — Inpatient Hospital Stay: Payer: Commercial Managed Care - PPO | Attending: Internal Medicine

## 2023-04-01 ENCOUNTER — Ambulatory Visit (HOSPITAL_COMMUNITY)
Admission: RE | Admit: 2023-04-01 | Discharge: 2023-04-01 | Disposition: A | Discharge status: Home / Self Care | Payer: Commercial Managed Care - PPO | Source: Ambulatory Visit | Attending: Internal Medicine | Admitting: Internal Medicine

## 2023-04-01 ENCOUNTER — Encounter (HOSPITAL_COMMUNITY): Payer: Self-pay

## 2023-04-01 DIAGNOSIS — C3490 Malignant neoplasm of unspecified part of unspecified bronchus or lung: Secondary | ICD-10-CM

## 2023-04-01 DIAGNOSIS — Z08 Encounter for follow-up examination after completed treatment for malignant neoplasm: Secondary | ICD-10-CM | POA: Insufficient documentation

## 2023-04-01 DIAGNOSIS — Z85118 Personal history of other malignant neoplasm of bronchus and lung: Secondary | ICD-10-CM | POA: Insufficient documentation

## 2023-04-01 LAB — CMP (CANCER CENTER ONLY)
ALT: 11 U/L (ref 0–44)
AST: 14 U/L — ABNORMAL LOW (ref 15–41)
Albumin: 4 g/dL (ref 3.5–5.0)
Alkaline Phosphatase: 80 U/L (ref 38–126)
Anion gap: 6 (ref 5–15)
BUN: 21 mg/dL (ref 8–23)
CO2: 28 mmol/L (ref 22–32)
Calcium: 10 mg/dL (ref 8.9–10.3)
Chloride: 108 mmol/L (ref 98–111)
Creatinine: 1.07 mg/dL — ABNORMAL HIGH (ref 0.44–1.00)
GFR, Estimated: 54 mL/min — ABNORMAL LOW (ref >60)
Glucose, Bld: 78 mg/dL (ref 70–99)
Potassium: 4 mmol/L (ref 3.5–5.1)
Sodium: 142 mmol/L (ref 135–145)
Total Bilirubin: 0.3 mg/dL (ref 0.3–1.2)
Total Protein: 7 g/dL (ref 6.5–8.1)

## 2023-04-01 LAB — CBC WITH DIFFERENTIAL (CANCER CENTER ONLY)
Abs Immature Granulocytes: 0.03 10*3/uL (ref 0.00–0.07)
Basophils Absolute: 0.1 10*3/uL (ref 0.0–0.1)
Basophils Relative: 1 %
Eosinophils Absolute: 0.2 10*3/uL (ref 0.0–0.5)
Eosinophils Relative: 4 %
HCT: 40.2 % (ref 36.0–46.0)
Hemoglobin: 13 g/dL (ref 12.0–15.0)
Immature Granulocytes: 1 %
Lymphocytes Relative: 29 %
Lymphs Abs: 1.8 10*3/uL (ref 0.7–4.0)
MCH: 28.9 pg (ref 26.0–34.0)
MCHC: 32.3 g/dL (ref 30.0–36.0)
MCV: 89.3 fL (ref 80.0–100.0)
Monocytes Absolute: 0.6 10*3/uL (ref 0.1–1.0)
Monocytes Relative: 9 %
Neutro Abs: 3.5 10*3/uL (ref 1.7–7.7)
Neutrophils Relative %: 56 %
Platelet Count: 308 10*3/uL (ref 150–400)
RBC: 4.5 MIL/uL (ref 3.87–5.11)
RDW: 14.5 % (ref 11.5–15.5)
WBC Count: 6.1 10*3/uL (ref 4.0–10.5)
nRBC: 0 % (ref 0.0–0.2)

## 2023-04-01 MED ORDER — IOHEXOL 300 MG/ML  SOLN
75.0000 mL | Freq: Once | INTRAMUSCULAR | Status: AC | PRN
  Administered 2023-04-01: 75 mL via INTRAVENOUS

## 2023-04-01 MED ORDER — SODIUM CHLORIDE (PF) 0.9 % IJ SOLN
INTRAMUSCULAR | Status: AC
  Filled 2023-04-01: qty 50

## 2023-04-03 ENCOUNTER — Inpatient Hospital Stay: Payer: Commercial Managed Care - PPO | Admitting: Internal Medicine

## 2023-04-03 VITALS — BP 135/90 | HR 93 | Temp 97.8°F | Resp 17 | Ht 68.0 in | Wt 179.6 lb

## 2023-04-03 DIAGNOSIS — C3490 Malignant neoplasm of unspecified part of unspecified bronchus or lung: Secondary | ICD-10-CM | POA: Diagnosis not present

## 2023-04-03 DIAGNOSIS — Z08 Encounter for follow-up examination after completed treatment for malignant neoplasm: Secondary | ICD-10-CM | POA: Diagnosis present

## 2023-04-03 DIAGNOSIS — Z85118 Personal history of other malignant neoplasm of bronchus and lung: Secondary | ICD-10-CM | POA: Diagnosis not present

## 2023-04-03 NOTE — Progress Notes (Signed)
[facility] [phone]   Fax:([phone]) [phone]  OFFICE PROGRESS NOTE  Rema Fendt, NP [address] [place] [address]  DIAGNOSIS: Stage IIIB (T4, N2, M0) non-small cell lung cancer, poorly differentiated squamous cell carcinoma.  She presented with large right upper lobe lung mass in addition to right hilar and mediastinal lymphadenopathy diagnosed in January 2020.  PRIOR THERAPY:  1) Concurrent chemoradiation with weekly carboplatin for AUC of 2 and paclitaxel 45 mg/M2.  First dose July 13, 2018.  Status post 8 cycles.  Last dose was given August 31, 2018. 2) Consolidation treatment with immunotherapy with Imfinzi 10 mg/KG every 2 weeks.  First dose starts October 13, 2018.  Status post 26 cycles.  CURRENT THERAPY: Observation.  INTERVAL HISTORY: Christina [name] 77 y.o. female returns to the clinic today for 36-month follow-up visit.Discussed the use of AI scribe software for clinical note transcription with the patient, who gave verbal consent to proceed.  History of Present Illness   Christina [name], a 77 year old patient, was diagnosed with stage 3B non-small cell lung cancer in January 2020. The patient underwent concurrent chemotherapy and radiation with carboplatin and paclitaxel, followed by a year of immunotherapy with Imfinzi. The patient has been under observation for approximately two years since the completion of treatment.  In the past six months, the patient reports feeling well with no significant complaints. There have been no symptoms of chest pain or breathing difficulties. The patient also denies experiencing nausea, vomiting, or any recent weight loss. In fact, there has been a slight weight gain of three pounds in the past few weeks. The patient also denies any headaches or changes in vision.  The patient's medication regimen has remained unchanged.      MEDICAL HISTORY: Past Medical History:  Diagnosis Date    Allergy    GERD (gastroesophageal reflux disease)    Hyperlipidemia    Hypertension    nscl ca dx'd 05/2018   lung cancer   Reflux    Tubulovillous adenoma of colon 2003    ALLERGIES:  is allergic to coconut (cocos nucifera), codeine, shrimp [shellfish allergy], and trandolapril.  MEDICATIONS:  Current Outpatient Medications  Medication Sig Dispense Refill   albuterol (VENTOLIN HFA) 108 (90 Base) MCG/ACT inhaler INHALE 1 TO 2 PUFFS INTO THE LUNGS EVERY 6 HOURS AS NEEDED FOR WHEEZING OR SHORTNESS OF BREATH 6.7 g 3   albuterol (VENTOLIN HFA) 108 (90 Base) MCG/ACT inhaler Inhale 2 puffs into the lungs every 6 (six) hours as needed for wheezing or shortness of breath (1-2 Puffs as needed). 6.7 g 3   amLODipine (NORVASC) 10 MG tablet TAKE 1 TABLET(10 MG) BY MOUTH DAILY 30 tablet 0   amLODipine (NORVASC) 10 MG tablet TAKE 1 TABLET(10 MG) BY MOUTH DAILY 90 tablet 0   amoxicillin-clavulanate (AUGMENTIN) 875-125 MG tablet Take 1 tablet by mouth 2 (two) times daily. 20 tablet 0   aspirin EC 81 MG tablet Take 81 mg by mouth daily.     atorvastatin (LIPITOR) 40 MG tablet TAKE 1 TABLET BY MOUTH DAILY 90 tablet 0   benzonatate (TESSALON) 100 MG capsule Take 1-2 capsules (100-200 mg total) by mouth 3 (three) times daily as needed. 30 capsule 0   omeprazole (PRILOSEC) 40 MG capsule Take 1 capsule (40 mg total) by mouth daily. 90 capsule 0   ondansetron (ZOFRAN) 4 MG tablet Take 1 tablet (4 mg total) by mouth every 6 (six) hours. 12 tablet 0  potassium chloride (KLOR-CON M) 10 MEQ tablet TAKE 1 TABLET(10 MEQ) BY MOUTH TWICE DAILY 180 tablet 0   sucralfate (CARAFATE) 1 g tablet TAKE 1 TABLET(1 GRAM) BY MOUTH FOUR TIMES DAILY AS NEEDED FOR STOMACH ACID 120 tablet 3   No current facility-administered medications for this visit.    SURGICAL HISTORY:  Past Surgical History:  Procedure Laterality Date   CESAREAN SECTION     1 time   COLONOSCOPY     POLYPECTOMY     VIDEO BRONCHOSCOPY WITH  ENDOBRONCHIAL NAVIGATION N/A 06/24/2018   Procedure: VIDEO BRONCHOSCOPY WITH ENDOBRONCHIAL NAVIGATION with biopsies, right upper lung lobe;  Surgeon: Loreli Slot, MD;  Location: [facility] OR;  Service: Thoracic;  Laterality: N/A;   VIDEO BRONCHOSCOPY WITH ENDOBRONCHIAL ULTRASOUND N/A 06/24/2018   Procedure: VIDEO BRONCHOSCOPY WITH ENDOBRONCHIAL ULTRASOUND, right upper lung lobe;  Surgeon: Loreli Slot, MD;  Location: [facility] OR;  Service: Thoracic;  Laterality: N/A;    REVIEW OF SYSTEMS:  A comprehensive review of systems was negative.   PHYSICAL EXAMINATION: General appearance: alert, cooperative, and no distress Head: Normocephalic, without obvious abnormality, atraumatic Neck: no adenopathy, no JVD, supple, symmetrical, trachea midline, and thyroid not enlarged, symmetric, no tenderness/mass/nodules Lymph nodes: Cervical, supraclavicular, and axillary nodes normal. Resp: clear to auscultation bilaterally Back: symmetric, no curvature. ROM normal. No CVA tenderness. Cardio: regular rate and rhythm, S1, S2 normal, no murmur, click, rub or gallop GI: soft, non-tender; bowel sounds normal; no masses,  no organomegaly Extremities: extremities normal, atraumatic, no cyanosis or edema  ECOG PERFORMANCE STATUS: 0 - Asymptomatic  Blood pressure (!) 135/90, pulse 93, temperature 97.8 F (36.6 C), temperature source Oral, resp. rate 17, height 5\' 8"  (1.727 m), weight 179 lb 9.6 oz (81.5 kg), SpO2 97%.  LABORATORY DATA: Lab Results  Component Value Date   WBC 6.1 04/01/2023   HGB 13.0 04/01/2023   HCT 40.2 04/01/2023   MCV 89.3 04/01/2023   PLT 308 04/01/2023      Chemistry      Component Value Date/Time   NA 142 04/01/2023 0747   NA 142 03/24/2023 0826   K 4.0 04/01/2023 0747   CL 108 04/01/2023 0747   CO2 28 04/01/2023 0747   BUN 21 04/01/2023 0747   BUN 16 03/24/2023 0826   CREATININE 1.07 (H) 04/01/2023 0747   CREATININE 0.87 03/22/2016 0908      Component Value Date/Time    CALCIUM 10.0 04/01/2023 0747   ALKPHOS 80 04/01/2023 0747   AST 14 (L) 04/01/2023 0747   ALT 11 04/01/2023 0747   BILITOT 0.3 04/01/2023 0747       RADIOGRAPHIC STUDIES: CT Chest W Contrast  Result Date: 04/03/2023 CLINICAL DATA:  Non-small cell lung cancer; * Tracking Code: BO * EXAM: CT CHEST WITH CONTRAST TECHNIQUE: Multidetector CT imaging of the chest was performed during intravenous contrast administration. RADIATION DOSE REDUCTION: This exam was performed according to the departmental dose-optimization program which includes automated exposure control, adjustment of the mA and/or kV according to patient size and/or use of iterative reconstruction technique. CONTRAST:  75mL OMNIPAQUE IOHEXOL 300 MG/ML  SOLN COMPARISON:  Multiple priors, most recent chest CT dated September 30, 2022 FINDINGS: Cardiovascular: Normal heart size. Trace pericardial effusion. Normal caliber thoracic aorta with moderate atherosclerotic disease. Severe coronary artery calcifications. Mediastinum/Nodes: Esophagus unremarkable. Unchanged left thyroid nodule 3 cm. No No enlarged lymph nodes seen in the chest. Unchanged soft tissue thickening of the right hilum. Lungs/Pleura: Waxing and waning mucous plugging of the  right lower lobe. Stable right perihilar postradiation change. Moderate emphysema. No new or enlarging pulmonary nodules. No pleural effusion. Upper Abdomen: Stable tiny low-attenuation lesion of the right lobe of the liver which is likely a simple cysts. No acute abnormality. Musculoskeletal: No chest wall abnormality. No acute or significant osseous findings. IMPRESSION: 1. Stable right perihilar postradiation change. No evidence of recurrent or metastatic disease in the chest. 2. Waxing and waning mucous plugging of the right lower lobe, likely due to recurrent aspiration. 3. Aortic Atherosclerosis (ICD10-I70.0) and Emphysema (ICD10-J43.9). Electronically Signed   By: Allegra Lai M.D.   On: 04/03/2023 09:31      ASSESSMENT AND PLAN: This is a very pleasant 77 years old African-American female recently diagnosed with a stage IIIB (T4, N2, M0) non-small cell lung cancer, poorly differentiated squamous cell carcinoma presented with right upper lobe lung mass in addition to right hilar and mediastinal lymphadenopathy diagnosed in January 2020. The patient underwent concurrent chemoradiation with weekly carboplatin and paclitaxel status post 8 cycles. She has partial response to this treatment. She also completed a course of consolidation treatment with immunotherapy with Imfinzi every 2 weeks status post 26 cycles. The patient has been on observation since that time and she is feeling fine with no concerning complaints. She had repeat CT scan of the chest performed recently that showed no concerning findings for disease recurrence or metastasis.    Stage 3B Non-Small Cell Lung Cancer Diagnosed in January 2020, treated with concurrent chemo-radiation with carboplatin and paclitaxel, followed by one year of immune therapy with Imfinzi. Currently on observation for two years. No new symptoms or complaints. Recent imaging shows no evidence of cancer growth or spread. -Continue observation. -Schedule next scan 10 days prior to next appointment in six months.  Aspiration Evidence of mucus plugging in the right lower lobe on recent imaging, suggestive of aspiration. -Advised patient to be cautious with eating and drinking to prevent further aspiration.    The patient voices understanding of current disease status and treatment options and is in agreement with the current care plan.  All questions were answered. The patient knows to call the clinic with any problems, questions or concerns. We can certainly see the patient much sooner if necessary.  Disclaimer: This note was dictated with voice recognition software. Similar sounding words can inadvertently be transcribed and may not be corrected upon  review.

## 2023-05-02 ENCOUNTER — Telehealth: Payer: Commercial Managed Care - PPO | Admitting: Family Medicine

## 2023-05-02 DIAGNOSIS — B9689 Other specified bacterial agents as the cause of diseases classified elsewhere: Secondary | ICD-10-CM | POA: Diagnosis not present

## 2023-05-02 DIAGNOSIS — J019 Acute sinusitis, unspecified: Secondary | ICD-10-CM | POA: Diagnosis not present

## 2023-05-02 MED ORDER — AMOXICILLIN-POT CLAVULANATE 875-125 MG PO TABS
1.0000 | ORAL_TABLET | Freq: Two times a day (BID) | ORAL | 0 refills | Status: DC
Start: 2023-05-02 — End: 2023-06-24

## 2023-05-02 NOTE — Progress Notes (Signed)
Virtual Visit Consent   [name], you are scheduled for a virtual visit with a [facility] provider today. Just as with appointments in the office, your consent must be obtained to participate. Your consent will be active for this visit and any virtual visit you may have with one of our providers in the next 365 days. If you have a MyChart account, a copy of this consent can be sent to you electronically.  As this is a virtual visit, video technology does not allow for your provider to perform a traditional examination. This may limit your provider's ability to fully assess your condition. If your provider identifies any concerns that need to be evaluated in person or the need to arrange testing (such as labs, EKG, etc.), we will make arrangements to do so. Although advances in technology are sophisticated, we cannot ensure that it will always work on either your end or our end. If the connection with a video visit is poor, the visit may have to be switched to a telephone visit. With either a video or telephone visit, we are not always able to ensure that we have a secure connection.  By engaging in this virtual visit, you consent to the provision of healthcare and authorize for your insurance to be billed (if applicable) for the services provided during this visit. Depending on your insurance coverage, you may receive a charge related to this service.  I need to obtain your verbal consent now. Are you willing to proceed with your visit today? Christina [name] has provided verbal consent on 05/02/2023 for a virtual visit (video or telephone). Georgana Curio, FNP  Date: 05/02/2023 11:09 AM  Virtual Visit via Video Note   I, Georgana Curio, connected with  Christina [name]  ([id number], 25-Oct-1945) on 05/02/23 at 11:00 AM EST by a video-enabled telemedicine application and verified that I am speaking with the correct person using two identifiers.  Location: Patient: Virtual Visit Location Patient:  Home Provider: Virtual Visit Location Provider: Home Office   I discussed the limitations of evaluation and management by telemedicine and the availability of in person appointments. The patient expressed understanding and agreed to proceed.    History of Present Illness: Christina [name] is a 77 y.o. who identifies as a female who was assigned female at birth, and is being seen today for sinus pressure and pain for over a week worsening. No allergy sx. No fever. Post nasal drainage is green. No cough. .  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   Angioedema 08/04/2014   GERD (gastroesophageal reflux disease) 08/10/2013   Other and unspecified hyperlipidemia 06/23/2013   Essential hypertension 07/23/2012    Allergies:  Allergies  Allergen Reactions   Coconut (Cocos Nucifera) Hives   Codeine Other (See Comments)    Upsets stomach really bad   Shrimp [Shellfish Allergy] Hives   Trandolapril Swelling    Swelling of the face/lips 07/2014  Medications:  Current Outpatient Medications:    albuterol (VENTOLIN HFA) 108 (90 Base) MCG/ACT inhaler, INHALE 1 TO 2 PUFFS INTO THE LUNGS EVERY 6 HOURS AS NEEDED FOR WHEEZING OR SHORTNESS OF BREATH, Disp: 6.7 g, Rfl: 3   albuterol (VENTOLIN HFA) 108 (90 Base) MCG/ACT inhaler, Inhale 2 puffs into the lungs every 6 (six) hours as needed for wheezing or shortness of breath (1-2 Puffs as  needed)., Disp: 6.7 g, Rfl: 3   amLODipine (NORVASC) 10 MG tablet, TAKE 1 TABLET(10 MG) BY MOUTH DAILY, Disp: 30 tablet, Rfl: 0   amLODipine (NORVASC) 10 MG tablet, TAKE 1 TABLET(10 MG) BY MOUTH DAILY, Disp: 90 tablet, Rfl: 0   amoxicillin-clavulanate (AUGMENTIN) 875-125 MG tablet, Take 1 tablet by mouth 2 (two) times daily., Disp: 20 tablet, Rfl: 0   aspirin EC 81 MG tablet, Take 81 mg by mouth daily., Disp: , Rfl:    atorvastatin (LIPITOR) 40 MG tablet, TAKE 1 TABLET BY MOUTH DAILY, Disp: 90 tablet, Rfl: 0   benzonatate (TESSALON) 100 MG capsule, Take 1-2 capsules (100-200 mg total) by mouth 3 (three) times daily as needed., Disp: 30 capsule, Rfl: 0   omeprazole (PRILOSEC) 40 MG capsule, Take 1 capsule (40 mg total) by mouth daily., Disp: 90 capsule, Rfl: 0   ondansetron (ZOFRAN) 4 MG tablet, Take 1 tablet (4 mg total) by mouth every 6 (six) hours., Disp: 12 tablet, Rfl: 0   potassium chloride (KLOR-CON M) 10 MEQ tablet, TAKE 1 TABLET(10 MEQ) BY MOUTH TWICE DAILY, Disp: 180 tablet, Rfl: 0   sucralfate (CARAFATE) 1 g tablet, TAKE 1 TABLET(1 GRAM) BY MOUTH FOUR TIMES DAILY AS NEEDED FOR STOMACH ACID, Disp: 120 tablet, Rfl: 3  Observations/Objective: Patient is well-developed, well-nourished in no acute distress.  Resting comfortably  at home.  Head is normocephalic, atraumatic.  No labored breathing.  Speech is clear and coherent with logical content.  Patient is alert and oriented at baseline.    Assessment and Plan: 1. Acute bacterial sinusitis  Increase fluids, humidifier at night, tylenol or ibuprofen as directed, UC if sx worsen.   Follow Up Instructions: I discussed the assessment and treatment plan with the patient. The patient was provided an opportunity to ask questions and all were answered. The patient agreed with the plan and demonstrated an understanding of the instructions.  A copy of instructions were sent to the patient via MyChart unless otherwise noted below.     The  patient was advised to call back or seek an in-person evaluation if the symptoms worsen or if the condition fails to improve as anticipated.    Georgana Curio, FNP

## 2023-05-14 ENCOUNTER — Other Ambulatory Visit: Payer: Self-pay | Admitting: Family

## 2023-05-14 ENCOUNTER — Other Ambulatory Visit: Payer: Self-pay

## 2023-05-14 DIAGNOSIS — I10 Essential (primary) hypertension: Secondary | ICD-10-CM

## 2023-05-14 MED ORDER — POTASSIUM CHLORIDE CRYS ER 10 MEQ PO TBCR: EXTENDED_RELEASE_TABLET | ORAL | 0 refills | Status: DC

## 2023-05-17 ENCOUNTER — Other Ambulatory Visit: Payer: Self-pay | Admitting: Family

## 2023-05-17 DIAGNOSIS — K219 Gastro-esophageal reflux disease without esophagitis: Secondary | ICD-10-CM

## 2023-05-19 ENCOUNTER — Other Ambulatory Visit: Payer: Self-pay

## 2023-05-19 DIAGNOSIS — K219 Gastro-esophageal reflux disease without esophagitis: Secondary | ICD-10-CM

## 2023-05-19 MED ORDER — SUCRALFATE 1 G PO TABS: ORAL_TABLET | ORAL | 3 refills | Status: DC

## 2023-06-06 ENCOUNTER — Other Ambulatory Visit: Payer: Self-pay | Admitting: Family

## 2023-06-06 DIAGNOSIS — E785 Hyperlipidemia, unspecified: Secondary | ICD-10-CM

## 2023-06-09 NOTE — Telephone Encounter (Signed)
Complete

## 2023-06-17 ENCOUNTER — Other Ambulatory Visit: Payer: Self-pay

## 2023-06-17 ENCOUNTER — Other Ambulatory Visit: Payer: Self-pay | Admitting: Family

## 2023-06-17 DIAGNOSIS — R062 Wheezing: Secondary | ICD-10-CM

## 2023-06-17 DIAGNOSIS — J3089 Other allergic rhinitis: Secondary | ICD-10-CM

## 2023-06-17 MED ORDER — ALBUTEROL SULFATE HFA 108 (90 BASE) MCG/ACT IN AERS
1.0000 | INHALATION_SPRAY | Freq: Four times a day (QID) | RESPIRATORY_TRACT | 3 refills | Status: DC | PRN
Start: 2023-06-17 — End: 2023-09-29

## 2023-06-24 ENCOUNTER — Ambulatory Visit: Payer: Commercial Managed Care - PPO | Admitting: Family

## 2023-06-24 VITALS — BP 133/68 | HR 89 | Temp 98.4°F | Ht 68.0 in | Wt 172.6 lb

## 2023-06-24 DIAGNOSIS — J3089 Other allergic rhinitis: Secondary | ICD-10-CM | POA: Diagnosis not present

## 2023-06-24 DIAGNOSIS — E785 Hyperlipidemia, unspecified: Secondary | ICD-10-CM

## 2023-06-24 DIAGNOSIS — I10 Essential (primary) hypertension: Secondary | ICD-10-CM | POA: Diagnosis not present

## 2023-06-24 DIAGNOSIS — K219 Gastro-esophageal reflux disease without esophagitis: Secondary | ICD-10-CM | POA: Diagnosis not present

## 2023-06-24 MED ORDER — FLUTICASONE PROPIONATE 50 MCG/ACT NA SUSP: 2.0000 | Freq: Every day | NASAL | 1 refills | Status: DC

## 2023-06-24 MED ORDER — SUCRALFATE 1 G PO TABS: ORAL_TABLET | ORAL | 2 refills | Status: DC

## 2023-06-24 MED ORDER — AMLODIPINE BESYLATE 10 MG PO TABS
ORAL_TABLET | ORAL | 0 refills | Status: DC
Start: 2023-06-24 — End: 2023-09-22

## 2023-06-24 MED ORDER — ATORVASTATIN CALCIUM 40 MG PO TABS
40.0000 mg | ORAL_TABLET | Freq: Every day | ORAL | 0 refills | Status: DC
Start: 2023-06-24 — End: 2023-09-22

## 2023-06-24 MED ORDER — OMEPRAZOLE 40 MG PO CPDR
40.0000 mg | DELAYED_RELEASE_CAPSULE | Freq: Every day | ORAL | 0 refills | Status: DC
Start: 2023-06-24 — End: 2023-09-22

## 2023-06-24 NOTE — Progress Notes (Signed)
 Patient ID: Christina [name], female    DOB: 11-03-1945  MRN: [id number]  CC: Chronic Conditions Follow-Up  Subjective: Christina [name] is a 78 y.o. female who presents for chronic conditions follow-up.   Her concerns today include:  - Doing well on Amlodipine , no issues/concerns. She does not complain of red flag symptoms such as but not limited to chest pain, shortness of breath, worst headache of life, nausea/vomiting.  - Doing well on Atorvastatin , no issues/concerns.  - Doing well on Omeprazole  and Sucralfate , no issues/concerns.  - Needs refills of Fluticasone .   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) 07/06/2018   Goals of care, counseling/discussion 07/06/2018   Encounter for antineoplastic chemotherapy 07/06/2018   Primary cancer of right upper lobe of lung (HCC) 06/18/2018   Acute URI 05/25/2018   Seasonal allergies 10/06/2017   Thyromegaly 07/01/2017   Colon polyps 04/02/2017   Former smoker 09/27/2016   Smoker 09/27/2016   Cough 04/25/2016   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   Angioedema 08/04/2014   GERD (gastroesophageal reflux disease) 08/10/2013   Other and unspecified hyperlipidemia 06/23/2013   Essential hypertension 07/23/2012     Current Outpatient Medications on File Prior to Visit  Medication Sig Dispense Refill   albuterol  (VENTOLIN  HFA) 108 (90 Base) MCG/ACT inhaler Inhale 1-2 puffs into the lungs every 6 (six) hours as needed for wheezing or shortness of breath. 6.7 g 3   aspirin EC 81 MG tablet Take 81 mg by mouth daily.     potassium chloride  (KLOR-CON  M) 10 MEQ tablet TAKE 1 TABLET(10 MEQ) BY MOUTH TWICE DAILY 180 tablet 0   benzonatate  (TESSALON ) 100 MG capsule Take 1-2 capsules (100-200 mg total) by mouth 3 (three) times daily as needed. (Patient not taking: Reported on 06/24/2023) 30  capsule 0   ondansetron  (ZOFRAN ) 4 MG tablet Take 1 tablet (4 mg total) by mouth every 6 (six) hours. (Patient not taking: Reported on 06/24/2023) 12 tablet 0   No current facility-administered medications on file prior to visit.    Allergies  Allergen Reactions   Coconut (Cocos Nucifera) Hives   Codeine Other (See Comments)    Upsets stomach really bad   Shrimp [Shellfish Allergy] Hives   Trandolapril  Swelling    Swelling of the face/lips 07/2014    Social History   Socioeconomic History   Marital status: Married    Spouse name: Elsie   Number of children: 1   Years of education: 12th grade   Highest education level: Not on file  Occupational History   Occupation: school housekeeping    Comment: part-time  Tobacco Use   Smoking status: Former    Current packs/day: 0.50    Average packs/day: 0.5 packs/day for 50.0 years (25.0 ttl pk-yrs)    Types: Cigarettes    Passive exposure: Past   Smokeless tobacco: Never   Tobacco comments:    Previously quit in 2015 but has started smoking again   Vaping Use   Vaping status: Never Used  Substance and Sexual Activity   Alcohol use: No    Alcohol/week: 0.0 standard drinks of alcohol   Drug use: No   Sexual activity: Not Currently  Other Topics Concern   Not on file  Social History Narrative   Lives with her husband. Her daughter also lives in [place] with her husband and 3 children.  Social Drivers of Corporate Investment Banker Strain: Low Risk  (03/24/2023)   Overall Financial Resource Strain (CARDIA)    Difficulty of Paying Living Expenses: Not very hard  Food Insecurity: No Food Insecurity (03/24/2023)   Hunger Vital Sign    Worried About Running Out of Food in the Last Year: Never true    Ran Out of Food in the Last Year: Never true  Transportation Needs: No Transportation Needs (03/24/2023)   PRAPARE - Administrator, Civil Service (Medical): No    Lack of Transportation (Non-Medical): No  Physical  Activity: Inactive (03/24/2023)   Exercise Vital Sign    Days of Exercise per Week: 0 days    Minutes of Exercise per Session: 0 min  Stress: No Stress Concern Present (03/24/2023)   [employer] of Occupational Health - Occupational Stress Questionnaire    Feeling of Stress : Not at all  Social Connections: Socially Integrated (03/24/2023)   Social Connection and Isolation Panel [NHANES]    Frequency of Communication with Friends and Family: Twice a week    Frequency of Social Gatherings with Friends and Family: Twice a week    Attends Religious Services: 1 to 4 times per year    Active Member of [employer] or Organizations: Yes    Attends Banker Meetings: 1 to 4 times per year    Marital Status: Living with partner  Intimate Partner Violence: Not At Risk (03/24/2023)   Humiliation, Afraid, Rape, and Kick questionnaire    Fear of Current or Ex-Partner: No    Emotionally Abused: No    Physically Abused: No    Sexually Abused: No    Family History  Problem Relation Age of Onset   Hypertension Mother    Heart disease Mother    Leukemia Father    Cancer Father    Hypertension Sister    Hypertension Sister    Colon cancer Neg Hx    Colon polyps Neg Hx    Stomach cancer Neg Hx    Esophageal cancer Neg Hx     Past Surgical History:  Procedure Laterality Date   CESAREAN SECTION     1 time   COLONOSCOPY     POLYPECTOMY     VIDEO BRONCHOSCOPY WITH ENDOBRONCHIAL NAVIGATION N/A 06/24/2018   Procedure: VIDEO BRONCHOSCOPY WITH ENDOBRONCHIAL NAVIGATION with biopsies, right upper lung lobe;  Surgeon: Kerrin Elspeth BROCKS, MD;  Location: [facility] OR;  Service: Thoracic;  Laterality: N/A;   VIDEO BRONCHOSCOPY WITH ENDOBRONCHIAL ULTRASOUND N/A 06/24/2018   Procedure: VIDEO BRONCHOSCOPY WITH ENDOBRONCHIAL ULTRASOUND, right upper lung lobe;  Surgeon: Kerrin Elspeth BROCKS, MD;  Location: [facility] OR;  Service: Thoracic;  Laterality: N/A;    ROS: Review of Systems Negative except as stated  above  PHYSICAL EXAM: BP 133/68   Pulse 89   Temp 98.4 F (36.9 C) (Oral)   Ht 5' 8 (1.727 m)   Wt 172 lb 9.6 oz (78.3 kg)   SpO2 94%   BMI 26.24 kg/m   Physical Exam HENT:     Head: Normocephalic and atraumatic.     Nose: Nose normal.     Mouth/Throat:     Mouth: Mucous membranes are moist.     Pharynx: Oropharynx is clear.  Eyes:     Extraocular Movements: Extraocular movements intact.     Conjunctiva/sclera: Conjunctivae normal.     Pupils: Pupils are equal, round, and reactive to light.  Cardiovascular:     Rate and Rhythm: Normal  rate and regular rhythm.     Pulses: Normal pulses.     Heart sounds: Normal heart sounds.  Pulmonary:     Effort: Pulmonary effort is normal.     Breath sounds: Normal breath sounds.  Musculoskeletal:        General: Normal range of motion.     Cervical back: Normal range of motion and neck supple.  Neurological:     General: No focal deficit present.     Mental Status: She is alert and oriented to person, place, and time.  Psychiatric:        Mood and Affect: Mood normal.        Behavior: Behavior normal.    ASSESSMENT AND PLAN: 1. Primary hypertension (Primary) - Continue Amlodipine  as prescribed.  - Routine screening.  - Counseled on blood pressure goal of less than 140/90, low-sodium, DASH diet, medication compliance, 150 minutes of moderate intensity exercise per week as tolerated. Discussed medication compliance, adverse effects. - Follow-up with primary provider in 3 months or sooner if needed.  - amLODipine  (NORVASC ) 10 MG tablet; TAKE 1 TABLET(10 MG) BY MOUTH DAILY  Dispense: 90 tablet; Refill: 0 - Basic Metabolic Panel  2. Hyperlipidemia, unspecified hyperlipidemia type - Continue Atorvastatin  as prescribed. Counseled on medication adherence/adverse effects. - Follow-up with primary provider in 3 months or sooner if needed.  - atorvastatin  (LIPITOR) 40 MG tablet; Take 1 tablet (40 mg total) by mouth daily.  Dispense: 90  tablet; Refill: 0  3. Gastroesophageal reflux disease without esophagitis - Continue Omeprazole  and Sucralfate  as prescribed. Counseled on medication adherence/adverse effects.  - Follow-up with primary provider in 3 months or sooner if needed.  - omeprazole  (PRILOSEC) 40 MG capsule; Take 1 capsule (40 mg total) by mouth daily.  Dispense: 90 capsule; Refill: 0 - sucralfate  (CARAFATE ) 1 g tablet; TAKE 1 TABLET(1 GRAM) BY MOUTH FOUR TIMES DAILY AS NEEDED FOR STOMACH ACID  Dispense: 120 tablet; Refill: 2  4. Perennial allergic rhinitis - Continue Fluticasone  as prescribed. Counseled on medication adherence/adverse effects.  - Follow-up with primary provider as scheduled.  - fluticasone  (FLONASE ) 50 MCG/ACT nasal spray; Place 2 sprays into both nostrils daily.  Dispense: 16 g; Refill: 1   Patient was given the opportunity to ask questions.  Patient verbalized understanding of the plan and was able to repeat key elements of the plan. Patient was given clear instructions to go to Emergency Department or return to medical center if symptoms don't improve, worsen, or new problems develop.The patient verbalized understanding.   Orders Placed This Encounter  Procedures   Basic Metabolic Panel     Requested Prescriptions   Signed Prescriptions Disp Refills   amLODipine  (NORVASC ) 10 MG tablet 90 tablet 0    Sig: TAKE 1 TABLET(10 MG) BY MOUTH DAILY   atorvastatin  (LIPITOR) 40 MG tablet 90 tablet 0    Sig: Take 1 tablet (40 mg total) by mouth daily.   omeprazole  (PRILOSEC) 40 MG capsule 90 capsule 0    Sig: Take 1 capsule (40 mg total) by mouth daily.   sucralfate  (CARAFATE ) 1 g tablet 120 tablet 2    Sig: TAKE 1 TABLET(1 GRAM) BY MOUTH FOUR TIMES DAILY AS NEEDED FOR STOMACH ACID   fluticasone  (FLONASE ) 50 MCG/ACT nasal spray 16 g 1    Sig: Place 2 sprays into both nostrils daily.    Return in about 3 months (around 09/22/2023) for Follow-Up or next available chronic conditions.  Greig JINNY Drones, NP

## 2023-06-24 NOTE — Progress Notes (Signed)
 Patient states no concerns to discuss.  States she's here for a regular checkup.

## 2023-06-25 LAB — BASIC METABOLIC PANEL
BUN/Creatinine Ratio: 22 (ref 12–28)
BUN: 19 mg/dL (ref 8–27)
CO2: 26 mmol/L (ref 20–29)
Calcium: 9.9 mg/dL (ref 8.7–10.3)
Chloride: 105 mmol/L (ref 96–106)
Creatinine, Ser: 0.87 mg/dL (ref 0.57–1.00)
Glucose: 95 mg/dL (ref 70–99)
Potassium: 4.4 mmol/L (ref 3.5–5.2)
Sodium: 143 mmol/L (ref 134–144)
eGFR: 69 mL/min/{1.73_m2} (ref >59)

## 2023-07-05 ENCOUNTER — Ambulatory Visit (INDEPENDENT_AMBULATORY_CARE_PROVIDER_SITE_OTHER): Payer: Commercial Managed Care - PPO

## 2023-07-05 ENCOUNTER — Ambulatory Visit
Admission: EM | Admit: 2023-07-05 | Discharge: 2023-07-05 | Disposition: A | Discharge status: Home / Self Care | Payer: Commercial Managed Care - PPO | Attending: Physician Assistant | Admitting: Physician Assistant

## 2023-07-05 ENCOUNTER — Encounter: Payer: Self-pay | Admitting: Emergency Medicine

## 2023-07-05 ENCOUNTER — Other Ambulatory Visit: Payer: Self-pay

## 2023-07-05 DIAGNOSIS — J441 Chronic obstructive pulmonary disease with (acute) exacerbation: Secondary | ICD-10-CM | POA: Diagnosis not present

## 2023-07-05 DIAGNOSIS — J3089 Other allergic rhinitis: Secondary | ICD-10-CM

## 2023-07-05 DIAGNOSIS — R051 Acute cough: Secondary | ICD-10-CM | POA: Diagnosis not present

## 2023-07-05 DIAGNOSIS — R062 Wheezing: Secondary | ICD-10-CM

## 2023-07-05 LAB — POC COVID19/FLU A&B COMBO
Covid Antigen, POC: NEGATIVE
Influenza A Antigen, POC: NEGATIVE
Influenza B Antigen, POC: NEGATIVE

## 2023-07-05 MED ORDER — PREDNISONE 20 MG PO TABS: 40.0000 mg | ORAL_TABLET | Freq: Every day | ORAL | 0 refills | Status: AC

## 2023-07-05 MED ORDER — SPIRIVA RESPIMAT 2.5 MCG/ACT IN AERS: 2.0000 | INHALATION_SPRAY | Freq: Every day | RESPIRATORY_TRACT | 0 refills | Status: DC

## 2023-07-05 MED ORDER — BENZONATATE 100 MG PO CAPS: 100.0000 mg | ORAL_CAPSULE | Freq: Three times a day (TID) | ORAL | 0 refills | Status: DC

## 2023-07-05 MED ORDER — AMOXICILLIN-POT CLAVULANATE 500-125 MG PO TABS: 1.0000 | ORAL_TABLET | Freq: Two times a day (BID) | ORAL | 0 refills | Status: AC | PRN

## 2023-07-05 NOTE — ED Triage Notes (Signed)
Pt here for cough and congestion x 2 days 

## 2023-07-05 NOTE — Discharge Instructions (Signed)
You tested negative for COVID and flu.  Your x-ray showed that you have COPD and with your increasing coughing I am concerned for COPD exacerbation.  Continue albuterol as prescribed by your primary care.  Start Spiriva daily.  Take Augmentin twice daily for 7 days.  Start prednisone 40 mg for 4 days.  Do not take NSAIDs with this medication including aspirin, ibuprofen/Advil, naproxen/Aleve.  You can use Mucinex and Flonase for symptom relief.  If your symptoms are not improving within a few days or if anything worsens and you have high fever, worsening cough, shortness of breath, nausea, vomiting you need to be seen immediately.

## 2023-07-05 NOTE — ED Provider Notes (Signed)
[facility]    CSN: [id number] Arrival date & time: 07/05/23  0835      History   Chief Complaint Chief Complaint  Patient presents with   Cough    HPI Christina [name] is a 78 y.o. female.   Patient presents today with a 2-day history of URI symptoms including cough and congestion.  She denies any fever, nausea, vomiting, chest pain, shortness of breath.  Denies any known sick contacts.  She has never had COVID.  She has had COVID-19 vaccinations.  She has taken Mucinex without improvement of symptoms.  She has a history of seasonal allergies and takes fluticasone nasal spray which she has been consistently taking without improvement of symptoms.  She denies any recent antibiotics or steroids.  She does have a history of lung cancer but is not actively receiving treatment.  She does have a history of smoking but reports that she is no longer smoking.  She denies formal diagnosis of COPD or asthma.    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) 07/06/2018   Goals of care, counseling/discussion 07/06/2018   Encounter for antineoplastic chemotherapy 07/06/2018   Primary cancer of right upper lobe of lung (HCC) 06/18/2018   Acute URI 05/25/2018   Seasonal allergies 10/06/2017   Thyromegaly 07/01/2017   Colon polyps 04/02/2017   Former smoker 09/27/2016   Smoker 09/27/2016   Cough 04/25/2016   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   Angioedema 08/04/2014   GERD (gastroesophageal reflux disease) 08/10/2013   Other and unspecified hyperlipidemia 06/23/2013   Essential hypertension 07/23/2012     Past Surgical History:  Procedure Laterality Date   CESAREAN SECTION     1 time   COLONOSCOPY     POLYPECTOMY     VIDEO BRONCHOSCOPY WITH ENDOBRONCHIAL NAVIGATION N/A 06/24/2018   Procedure: VIDEO BRONCHOSCOPY WITH ENDOBRONCHIAL NAVIGATION with biopsies, right upper lung lobe;  Surgeon: Loreli Slot, MD;  Location: [facility] OR;  Service: Thoracic;  Laterality: N/A;   VIDEO BRONCHOSCOPY WITH ENDOBRONCHIAL ULTRASOUND N/A 06/24/2018   Procedure: VIDEO BRONCHOSCOPY WITH ENDOBRONCHIAL ULTRASOUND, right upper lung lobe;  Surgeon: Loreli Slot, MD;  Location: [facility] OR;  Service: Thoracic;  Laterality: N/A;    OB History   No obstetric history on file.      Home Medications    Prior to Admission medications   Medication Sig Start Date End Date Taking? Authorizing Provider  amoxicillin-clavulanate (AUGMENTIN) 500-125 MG tablet Take 1 tablet by mouth 2 (two) times daily as needed for up to 7 days. 07/05/23 07/12/23 Yes Norlan Rann K, PA-C  benzonatate (TESSALON) 100 MG capsule Take 1 capsule (100 mg total) by mouth every 8 (eight) hours. 07/05/23  Yes Teira Arcilla K, PA-C  predniSONE (DELTASONE) 20 MG tablet Take 2 tablets (40 mg total) by mouth daily for 4 days. 07/05/23 07/09/23 Yes Charisa Twitty, Noberto Retort, PA-C  Tiotropium Bromide Monohydrate (SPIRIVA RESPIMAT) 2.5 MCG/ACT AERS Inhale 2 puffs into the lungs daily. 07/05/23  Yes Merlyn Bollen K, PA-C  albuterol (VENTOLIN HFA) 108 (90 Base) MCG/ACT inhaler Inhale 1-2  puffs into the lungs every 6 (six) hours as needed for wheezing or shortness of breath. 06/17/23   Rema Fendt, NP  amLODipine (NORVASC) 10 MG tablet TAKE 1 TABLET(10 MG) BY MOUTH DAILY 06/24/23   Rema Fendt, NP  aspirin EC 81 MG tablet Take 81 mg by mouth daily.    [provider]  atorvastatin (LIPITOR) 40 MG tablet Take 1 tablet (40 mg total) by mouth daily. 06/24/23   Rema Fendt, NP  fluticasone (FLONASE) 50 MCG/ACT nasal spray Place 2 sprays into both nostrils  daily. 06/24/23   Rema Fendt, NP  omeprazole (PRILOSEC) 40 MG capsule Take 1 capsule (40 mg total) by mouth daily. 06/24/23   Rema Fendt, NP  potassium chloride (KLOR-CON M) 10 MEQ tablet TAKE 1 TABLET(10 MEQ) BY MOUTH TWICE DAILY 05/14/23   Rema Fendt, NP  sucralfate (CARAFATE) 1 g tablet TAKE 1 TABLET(1 GRAM) BY MOUTH FOUR TIMES DAILY AS NEEDED FOR STOMACH ACID 06/24/23   Rema Fendt, NP    Family History Family History  Problem Relation Age of Onset   Hypertension Mother    Heart disease Mother    Leukemia Father    Cancer Father    Hypertension Sister    Hypertension Sister    Colon cancer Neg Hx    Colon polyps Neg Hx    Stomach cancer Neg Hx    Esophageal cancer Neg Hx     Social History Social History   Tobacco Use   Smoking status: Former    Current packs/day: 0.50    Average packs/day: 0.5 packs/day for 50.0 years (25.0 ttl pk-yrs)    Types: Cigarettes    Passive exposure: Past   Smokeless tobacco: Never   Tobacco comments:    Previously quit in 2015 but has started smoking again   Vaping Use   Vaping status: Never Used  Substance Use Topics   Alcohol use: No    Alcohol/week: 0.0 standard drinks of alcohol   Drug use: No     Allergies   Coconut (cocos nucifera), Codeine, Shrimp [shellfish allergy], and Trandolapril   Review of Systems Review of Systems  Constitutional:  Positive for activity change. Negative for appetite change, fatigue and fever.  HENT:  Positive for congestion and postnasal drip. Negative for sneezing and sore throat.   Respiratory:  Positive for cough. Negative for shortness of breath.   Cardiovascular:  Negative for chest pain.  Gastrointestinal:  Negative for abdominal pain, diarrhea, nausea and vomiting.  Neurological:  Negative for dizziness, light-headedness and headaches.     Physical Exam Triage Vital Signs ED Triage Vitals  Encounter Vitals Group     BP 07/05/23 0847 134/82     Systolic BP Percentile --       Diastolic BP Percentile --      Pulse Rate 07/05/23 0847 94     Resp 07/05/23 0847 18     Temp 07/05/23 0847 98.1 F (36.7 C)     Temp Source 07/05/23 0847 Oral     SpO2 07/05/23 0847 95 %     Weight --      Height --      Head Circumference --      Peak Flow --      Pain Score 07/05/23 0848 5     Pain Loc --      Pain Education --      Exclude from Growth Chart --    No data  found.  Updated Vital Signs BP 134/82 (BP Location: Right Arm)   Pulse 94   Temp 98.1 F (36.7 C) (Oral)   Resp 18   SpO2 95%   Visual Acuity Right Eye Distance:   Left Eye Distance:   Bilateral Distance:    Right Eye Near:   Left Eye Near:    Bilateral Near:     Physical Exam Vitals reviewed.  Constitutional:      General: She is awake. She is not in acute distress.    Appearance: Normal appearance. She is well-developed. She is not ill-appearing.     Comments: Very pleasant female appears stated age in no acute distress sitting comfortably in exam room  HENT:     Head: Normocephalic and atraumatic.     Right Ear: Tympanic membrane, ear canal and external ear normal. Tympanic membrane is not erythematous or bulging.     Left Ear: Tympanic membrane, ear canal and external ear normal. Tympanic membrane is not erythematous or bulging.     Nose:     Right Sinus: No maxillary sinus tenderness or frontal sinus tenderness.     Left Sinus: No maxillary sinus tenderness or frontal sinus tenderness.     Mouth/Throat:     Pharynx: Uvula midline. Postnasal drip present. No oropharyngeal exudate or posterior oropharyngeal erythema.  Cardiovascular:     Rate and Rhythm: Normal rate and regular rhythm.     Heart sounds: Normal heart sounds, S1 normal and S2 normal. No murmur heard. Pulmonary:     Effort: Pulmonary effort is normal.     Breath sounds: Examination of the right-lower field reveals decreased breath sounds. Decreased breath sounds present. No wheezing, rhonchi or rales.  Psychiatric:         Behavior: Behavior is cooperative.      UC Treatments / Results  Labs (all labs ordered are listed, but only abnormal results are displayed) Labs Reviewed  POC COVID19/FLU A&B COMBO - Normal    EKG   Radiology DG Chest 2 View Result Date: 07/05/2023 CLINICAL DATA:  Decreased breath sounds right lower lung. Cough and congestion 2 days. EXAM: CHEST - 2 VIEW COMPARISON:  07/20/2022 FINDINGS: Lungs are hyperexpanded with chronic linear scarring right mid to upper lung. No acute airspace process or effusion. No pneumothorax. Cardiomediastinal silhouette and remainder of the exam is unchanged. IMPRESSION: 1. No acute cardiopulmonary disease. 2. 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 Electronically Signed   By: Elberta Fortis M.D.   On: 07/05/2023 09:11    Procedures Procedures (including critical care time)  Medications Ordered in UC Medications - No data to display  Initial Impression / Assessment and Plan / UC Course  I have reviewed the triage vital signs and the nursing notes.  Pertinent labs & imaging results that were available during my care of the patient were reviewed by me and considered in my medical decision making (see chart for details).     Patient is well-appearing, afebrile, nontoxic, nontachycardic, with oxygen saturation of 95%.  Viral testing was obtained and was negative for COVID and flu.  Chest x-ray was obtained given abnormal breath sounds with history of smoking that showed evidence of COPD without focal consolidation.  We discussed that given her increased sputum production and cough she likely has a COPD exacerbation.  Will treat with Augmentin 500 mg / 125 mg twice daily for 7 days.  No indication for dose adjustment based on metabolic panel from 06/24/2023 with  creatinine of 0.87 and calculated creatinine clearance of 68.8 mL/min.  Will start prednisone burst of 40 mg for 4 days.  Discussed that she is not to take NSAIDs with this  medication but can use over-the-counter medication such as Mucinex, Flonase, Tylenol.  She does have albuterol available and was encouraged to use this every 4-6 hours as needed.  Will also start Spiriva daily.  She was given Tessalon for cough.  Recommend close follow-up with her primary care.  Discussed that if anything worsens and she has worsening cough, high fever, chest pain, shortness of breath, nausea, vomiting she needs to be seen immediately.  Strict return precautions given.  Final Clinical Impressions(s) / UC Diagnoses   Final diagnoses:  Acute cough  COPD exacerbation (HCC)     Discharge Instructions      You tested negative for COVID and flu.  Your x-ray showed that you have COPD and with your increasing coughing I am concerned for COPD exacerbation.  Continue albuterol as prescribed by your primary care.  Start Spiriva daily.  Take Augmentin twice daily for 7 days.  Start prednisone 40 mg for 4 days.  Do not take NSAIDs with this medication including aspirin, ibuprofen/Advil, naproxen/Aleve.  You can use Mucinex and Flonase for symptom relief.  If your symptoms are not improving within a few days or if anything worsens and you have high fever, worsening cough, shortness of breath, nausea, vomiting you need to be seen immediately.     ED Prescriptions     Medication Sig Dispense Auth. Provider   amoxicillin-clavulanate (AUGMENTIN) 500-125 MG tablet Take 1 tablet by mouth 2 (two) times daily as needed for up to 7 days. 14 tablet Kawanda Drumheller K, PA-C   predniSONE (DELTASONE) 20 MG tablet Take 2 tablets (40 mg total) by mouth daily for 4 days. 8 tablet Harshil Cavallaro K, PA-C   benzonatate (TESSALON) 100 MG capsule Take 1 capsule (100 mg total) by mouth every 8 (eight) hours. 21 capsule Greely Atiyeh K, PA-C   Tiotropium Bromide Monohydrate (SPIRIVA RESPIMAT) 2.5 MCG/ACT AERS Inhale 2 puffs into the lungs daily. 4 g Gracemarie Skeet, Noberto Retort, PA-C      PDMP not reviewed this encounter.    Jeani Hawking, PA-C 07/05/23 1610

## 2023-08-20 ENCOUNTER — Other Ambulatory Visit: Payer: Self-pay | Admitting: Family

## 2023-08-20 DIAGNOSIS — J3089 Other allergic rhinitis: Secondary | ICD-10-CM

## 2023-09-18 ENCOUNTER — Other Ambulatory Visit: Payer: Self-pay | Admitting: Family

## 2023-09-18 ENCOUNTER — Other Ambulatory Visit: Payer: Self-pay

## 2023-09-18 DIAGNOSIS — I10 Essential (primary) hypertension: Secondary | ICD-10-CM

## 2023-09-18 MED ORDER — POTASSIUM CHLORIDE CRYS ER 10 MEQ PO TBCR: EXTENDED_RELEASE_TABLET | ORAL | 0 refills | Status: DC

## 2023-09-22 ENCOUNTER — Encounter: Payer: Self-pay | Admitting: Family

## 2023-09-22 ENCOUNTER — Ambulatory Visit: Payer: Commercial Managed Care - PPO | Admitting: Family

## 2023-09-22 VITALS — BP 136/88 | HR 92 | Temp 97.9°F | Resp 16 | Ht 68.0 in | Wt 175.4 lb

## 2023-09-22 DIAGNOSIS — K219 Gastro-esophageal reflux disease without esophagitis: Secondary | ICD-10-CM | POA: Diagnosis not present

## 2023-09-22 DIAGNOSIS — E785 Hyperlipidemia, unspecified: Secondary | ICD-10-CM

## 2023-09-22 DIAGNOSIS — I10 Essential (primary) hypertension: Secondary | ICD-10-CM | POA: Diagnosis not present

## 2023-09-22 DIAGNOSIS — J3089 Other allergic rhinitis: Secondary | ICD-10-CM | POA: Diagnosis not present

## 2023-09-22 MED ORDER — FLUTICASONE PROPIONATE 50 MCG/ACT NA SUSP: 2.0000 | Freq: Every day | NASAL | 1 refills | Status: DC

## 2023-09-22 MED ORDER — OMEPRAZOLE 40 MG PO CPDR: 40.0000 mg | DELAYED_RELEASE_CAPSULE | Freq: Every day | ORAL | 0 refills | Status: DC

## 2023-09-22 MED ORDER — AMLODIPINE BESYLATE 10 MG PO TABS: ORAL_TABLET | ORAL | 0 refills | Status: DC

## 2023-09-22 MED ORDER — SUCRALFATE 1 G PO TABS: ORAL_TABLET | ORAL | 2 refills | Status: AC

## 2023-09-22 MED ORDER — ATORVASTATIN CALCIUM 40 MG PO TABS: 40.0000 mg | ORAL_TABLET | Freq: Every day | ORAL | 0 refills | Status: DC

## 2023-09-22 NOTE — Progress Notes (Signed)
3 month follow

## 2023-09-22 NOTE — Progress Notes (Signed)
 Patient ID: Christina [name], female    DOB: 1946/03/03  MRN: [id number]  CC: Chronic Conditions Follow-Up  Subjective: Christina [name] is a 78 y.o. female who presents for chronic conditions follow-up.   Her concerns today include:  - Doing well on Amlodipine, no issues/concerns. She does not complain of red flag symptoms such as but not limited to chest pain, shortness of breath, worst headache of life, nausea/vomiting.  - Doing well on Atorvastatin, no issues/concerns.  - Doing well on Omeprazole and Sucralfate, no issues/concerns. - Doing well on Fluticasone, no issues/concerns.   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) 07/06/2018   Goals of care, counseling/discussion 07/06/2018   Encounter for antineoplastic chemotherapy 07/06/2018   Primary cancer of right upper lobe of lung (HCC) 06/18/2018   Acute URI 05/25/2018   Seasonal allergies 10/06/2017   Thyromegaly 07/01/2017   Colon polyps 04/02/2017   Former smoker 09/27/2016   Smoker 09/27/2016   Cough 04/25/2016   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   Angioedema 08/04/2014   GERD (gastroesophageal reflux disease) 08/10/2013   Other and unspecified hyperlipidemia 06/23/2013   Essential hypertension 07/23/2012     Current Outpatient Medications on File Prior to Visit  Medication Sig Dispense Refill   albuterol (VENTOLIN HFA) 108 (90 Base) MCG/ACT inhaler Inhale 1-2 puffs into the lungs every 6 (six) hours as needed for wheezing or shortness of breath. 6.7 g 3   aspirin EC 81 MG tablet Take 81 mg by mouth daily.     benzonatate (TESSALON) 100 MG capsule Take 1 capsule (100 mg total) by mouth every 8 (eight) hours. 21 capsule 0   potassium chloride (KLOR-CON M) 10 MEQ tablet TAKE 1 TABLET(10 MEQ) BY MOUTH TWICE DAILY 180 tablet 0   potassium chloride (KLOR-CON M) 10  MEQ tablet TAKE 1 TABLET(10 MEQ) BY MOUTH TWICE DAILY 180 tablet 0   Tiotropium Bromide Monohydrate (SPIRIVA RESPIMAT) 2.5 MCG/ACT AERS Inhale 2 puffs into the lungs daily. 4 g 0   No current facility-administered medications on file prior to visit.    Allergies  Allergen Reactions   Coconut (Cocos Nucifera) Hives   Codeine Other (See Comments)    Upsets stomach really bad   Shrimp [Shellfish Allergy] Hives   Trandolapril Swelling    Swelling of the face/lips 07/2014    Social History   Socioeconomic History   Marital status: Married    Spouse name: Christina [name]   Number of children: 1   Years of education: 12th grade   Highest education level: Not on file  Occupational History   Occupation: school housekeeping    Comment: part-time  Tobacco Use   Smoking status: Former    Current packs/day: 0.50    Average packs/day: 0.5 packs/day for 50.0 years (25.0 ttl pk-yrs)    Types: Cigarettes    Passive exposure: Past   Smokeless tobacco: Never   Tobacco comments:    Previously quit in 2015 but has started smoking again   Vaping Use   Vaping status: Never Used  Substance and Sexual Activity   Alcohol use: No    Alcohol/week: 0.0 standard drinks of alcohol   Drug use: No   Sexual activity: Not Currently  Other Topics Concern   Not on file  Social History Narrative   Lives with her husband. Her daughter also lives in [place] with  her husband and 3 children.   Social Drivers of Corporate investment banker Strain: Low Risk  (03/24/2023)   Overall Financial Resource Strain (CARDIA)    Difficulty of Paying Living Expenses: Not very hard  Food Insecurity: No Food Insecurity (03/24/2023)   Hunger Vital Sign    Worried About Running Out of Food in the Last Year: Never true    Ran Out of Food in the Last Year: Never true  Transportation Needs: No Transportation Needs (03/24/2023)   PRAPARE - Administrator, Civil Service (Medical): No    Lack of Transportation  (Non-Medical): No  Physical Activity: Inactive (03/24/2023)   Exercise Vital Sign    Days of Exercise per Week: 0 days    Minutes of Exercise per Session: 0 min  Stress: No Stress Concern Present (03/24/2023)   [employer] of Occupational Health - Occupational Stress Questionnaire    Feeling of Stress : Not at all  Social Connections: Socially Integrated (03/24/2023)   Social Connection and Isolation Panel [NHANES]    Frequency of Communication with Friends and Family: Twice a week    Frequency of Social Gatherings with Friends and Family: Twice a week    Attends Religious Services: 1 to 4 times per year    Active Member of [employer] or Organizations: Yes    Attends Banker Meetings: 1 to 4 times per year    Marital Status: Living with partner  Intimate Partner Violence: Not At Risk (03/24/2023)   Humiliation, Afraid, Rape, and Kick questionnaire    Fear of Current or Ex-Partner: No    Emotionally Abused: No    Physically Abused: No    Sexually Abused: No    Family History  Problem Relation Age of Onset   Hypertension Mother    Heart disease Mother    Leukemia Father    Cancer Father    Hypertension Sister    Hypertension Sister    Colon cancer Neg Hx    Colon polyps Neg Hx    Stomach cancer Neg Hx    Esophageal cancer Neg Hx     Past Surgical History:  Procedure Laterality Date   CESAREAN SECTION     1 time   COLONOSCOPY     POLYPECTOMY     VIDEO BRONCHOSCOPY WITH ENDOBRONCHIAL NAVIGATION N/A 06/24/2018   Procedure: VIDEO BRONCHOSCOPY WITH ENDOBRONCHIAL NAVIGATION with biopsies, right upper lung lobe;  Surgeon: Loreli Slot, MD;  Location: [facility] OR;  Service: Thoracic;  Laterality: N/A;   VIDEO BRONCHOSCOPY WITH ENDOBRONCHIAL ULTRASOUND N/A 06/24/2018   Procedure: VIDEO BRONCHOSCOPY WITH ENDOBRONCHIAL ULTRASOUND, right upper lung lobe;  Surgeon: Loreli Slot, MD;  Location: [facility] OR;  Service: Thoracic;  Laterality: N/A;    ROS: Review of  Systems Negative except as stated above  PHYSICAL EXAM: BP 136/88   Pulse 92   Temp 97.9 F (36.6 C) (Oral)   Resp 16   Ht 5\' 8"  (1.727 m)   Wt 175 lb 6.4 oz (79.6 kg)   SpO2 95%   BMI 26.67 kg/m   Physical Exam HENT:     Head: Normocephalic and atraumatic.     Nose: Nose normal.     Mouth/Throat:     Mouth: Mucous membranes are moist.     Pharynx: Oropharynx is clear.  Eyes:     Extraocular Movements: Extraocular movements intact.     Conjunctiva/sclera: Conjunctivae normal.     Pupils: Pupils are equal, round, and reactive to  light.  Cardiovascular:     Rate and Rhythm: Normal rate and regular rhythm.     Pulses: Normal pulses.     Heart sounds: Normal heart sounds.  Pulmonary:     Effort: Pulmonary effort is normal.     Breath sounds: Normal breath sounds.  Musculoskeletal:        General: Normal range of motion.     Cervical back: Normal range of motion and neck supple.  Neurological:     General: No focal deficit present.     Mental Status: She is alert and oriented to person, place, and time.  Psychiatric:        Mood and Affect: Mood normal.        Behavior: Behavior normal.     ASSESSMENT AND PLAN: 1. Primary hypertension (Primary) - Continue Amlodipine as prescribed.  - Counseled on blood pressure goal of less than 140/90, low-sodium, DASH diet, medication compliance, 150 minutes of moderate intensity exercise per week as tolerated. Discussed medication compliance, adverse effects. - Follow-up with primary provider in 3 months or sooner if needed.  - amLODipine (NORVASC) 10 MG tablet; TAKE 1 TABLET(10 MG) BY MOUTH DAILY  Dispense: 90 tablet; Refill: 0  2. Hyperlipidemia, unspecified hyperlipidemia type - Continue Atorvastatin as prescribed. Counseled on medication adherence/adverse effects.  - Follow-up with primary provider in 3 months or sooner if needed. - atorvastatin (LIPITOR) 40 MG tablet; Take 1 tablet (40 mg total) by mouth daily.  Dispense: 90  tablet; Refill: 0  3. Gastroesophageal reflux disease without esophagitis - Continue Omeprazole and Sucralfate as prescribed. Counseled on medication adherence/adverse effects.  - Follow-up with primary provider in 3 months or sooner if needed. - omeprazole (PRILOSEC) 40 MG capsule; Take 1 capsule (40 mg total) by mouth daily.  Dispense: 90 capsule; Refill: 0 - sucralfate (CARAFATE) 1 g tablet; TAKE 1 TABLET(1 GRAM) BY MOUTH FOUR TIMES DAILY AS NEEDED FOR STOMACH ACID  Dispense: 120 tablet; Refill: 2  4. Perennial allergic rhinitis - Continue Fluticasone nasal spray as prescribed. Counseled on medication adherence/adverse effects.  - Follow-up with primary provider in 3 months or sooner if needed.  - fluticasone (FLONASE) 50 MCG/ACT nasal spray; Place 2 sprays into both nostrils daily.  Dispense: 16 g; Refill: 1    Patient was given the opportunity to ask questions.  Patient verbalized understanding of the plan and was able to repeat key elements of the plan. Patient was given clear instructions to go to Emergency Department or return to medical center if symptoms don't improve, worsen, or new problems develop.The patient verbalized understanding.  Requested Prescriptions   Signed Prescriptions Disp Refills   amLODipine (NORVASC) 10 MG tablet 90 tablet 0    Sig: TAKE 1 TABLET(10 MG) BY MOUTH DAILY   atorvastatin (LIPITOR) 40 MG tablet 90 tablet 0    Sig: Take 1 tablet (40 mg total) by mouth daily.   fluticasone (FLONASE) 50 MCG/ACT nasal spray 16 g 1    Sig: Place 2 sprays into both nostrils daily.   omeprazole (PRILOSEC) 40 MG capsule 90 capsule 0    Sig: Take 1 capsule (40 mg total) by mouth daily.   sucralfate (CARAFATE) 1 g tablet 120 tablet 2    Sig: TAKE 1 TABLET(1 GRAM) BY MOUTH FOUR TIMES DAILY AS NEEDED FOR STOMACH ACID    Return in about 3 months (around 12/22/2023) for Follow-Up or next available chronic conditions.  Rema Fendt, NP

## 2023-09-23 ENCOUNTER — Inpatient Hospital Stay: Payer: Commercial Managed Care - PPO

## 2023-09-23 ENCOUNTER — Inpatient Hospital Stay: Attending: Internal Medicine

## 2023-09-23 ENCOUNTER — Ambulatory Visit (HOSPITAL_COMMUNITY)
Admission: RE | Admit: 2023-09-23 | Discharge: 2023-09-23 | Disposition: A | Discharge status: Home / Self Care | Source: Ambulatory Visit | Attending: Internal Medicine | Admitting: Internal Medicine

## 2023-09-23 DIAGNOSIS — Z923 Personal history of irradiation: Secondary | ICD-10-CM | POA: Insufficient documentation

## 2023-09-23 DIAGNOSIS — C3490 Malignant neoplasm of unspecified part of unspecified bronchus or lung: Secondary | ICD-10-CM | POA: Diagnosis present

## 2023-09-23 DIAGNOSIS — Z85118 Personal history of other malignant neoplasm of bronchus and lung: Secondary | ICD-10-CM | POA: Insufficient documentation

## 2023-09-23 DIAGNOSIS — Z9221 Personal history of antineoplastic chemotherapy: Secondary | ICD-10-CM | POA: Insufficient documentation

## 2023-09-23 LAB — CMP (CANCER CENTER ONLY)
ALT: 11 U/L (ref 0–44)
AST: 15 U/L (ref 15–41)
Albumin: 4.2 g/dL (ref 3.5–5.0)
Alkaline Phosphatase: 95 U/L (ref 38–126)
Anion gap: 6 (ref 5–15)
BUN: 16 mg/dL (ref 8–23)
CO2: 29 mmol/L (ref 22–32)
Calcium: 9.6 mg/dL (ref 8.9–10.3)
Chloride: 104 mmol/L (ref 98–111)
Creatinine: 0.92 mg/dL (ref 0.44–1.00)
GFR, Estimated: >60 mL/min (ref >60)
Glucose, Bld: 92 mg/dL (ref 70–99)
Potassium: 4 mmol/L (ref 3.5–5.1)
Sodium: 139 mmol/L (ref 135–145)
Total Bilirubin: 0.5 mg/dL (ref 0.0–1.2)
Total Protein: 7.6 g/dL (ref 6.5–8.1)

## 2023-09-23 LAB — CBC WITH DIFFERENTIAL (CANCER CENTER ONLY)
Abs Immature Granulocytes: 0.01 10*3/uL (ref 0.00–0.07)
Basophils Absolute: 0.1 10*3/uL (ref 0.0–0.1)
Basophils Relative: 1 %
Eosinophils Absolute: 0.1 10*3/uL (ref 0.0–0.5)
Eosinophils Relative: 2 %
HCT: 41.5 % (ref 36.0–46.0)
Hemoglobin: 13.5 g/dL (ref 12.0–15.0)
Immature Granulocytes: 0 %
Lymphocytes Relative: 23 %
Lymphs Abs: 1.4 10*3/uL (ref 0.7–4.0)
MCH: 28.1 pg (ref 26.0–34.0)
MCHC: 32.5 g/dL (ref 30.0–36.0)
MCV: 86.5 fL (ref 80.0–100.0)
Monocytes Absolute: 0.5 10*3/uL (ref 0.1–1.0)
Monocytes Relative: 8 %
Neutro Abs: 4.1 10*3/uL (ref 1.7–7.7)
Neutrophils Relative %: 66 %
Platelet Count: 349 10*3/uL (ref 150–400)
RBC: 4.8 MIL/uL (ref 3.87–5.11)
RDW: 14.6 % (ref 11.5–15.5)
WBC Count: 6.2 10*3/uL (ref 4.0–10.5)
nRBC: 0 % (ref 0.0–0.2)

## 2023-09-23 MED ORDER — SODIUM CHLORIDE (PF) 0.9 % IJ SOLN
INTRAMUSCULAR | Status: AC
  Filled 2023-09-23: qty 50

## 2023-09-23 MED ORDER — IOHEXOL 300 MG/ML  SOLN
100.0000 mL | Freq: Once | INTRAMUSCULAR | Status: AC | PRN
  Administered 2023-09-23: 75 mL via INTRAVENOUS

## 2023-09-29 ENCOUNTER — Other Ambulatory Visit: Payer: Self-pay | Admitting: Family

## 2023-09-29 DIAGNOSIS — J3089 Other allergic rhinitis: Secondary | ICD-10-CM

## 2023-09-29 DIAGNOSIS — R062 Wheezing: Secondary | ICD-10-CM

## 2023-09-30 ENCOUNTER — Inpatient Hospital Stay (HOSPITAL_BASED_OUTPATIENT_CLINIC_OR_DEPARTMENT_OTHER): Payer: Commercial Managed Care - PPO | Admitting: Internal Medicine

## 2023-09-30 VITALS — BP 135/85 | HR 93 | Temp 98.0°F | Resp 16 | Ht 68.0 in | Wt 179.2 lb

## 2023-09-30 DIAGNOSIS — Z9221 Personal history of antineoplastic chemotherapy: Secondary | ICD-10-CM | POA: Diagnosis not present

## 2023-09-30 DIAGNOSIS — C3490 Malignant neoplasm of unspecified part of unspecified bronchus or lung: Secondary | ICD-10-CM | POA: Diagnosis not present

## 2023-09-30 DIAGNOSIS — Z923 Personal history of irradiation: Secondary | ICD-10-CM | POA: Diagnosis not present

## 2023-09-30 DIAGNOSIS — Z85118 Personal history of other malignant neoplasm of bronchus and lung: Secondary | ICD-10-CM | POA: Diagnosis present

## 2023-09-30 NOTE — Progress Notes (Signed)
 [facility] [phone]   [phone]) [phone]  OFFICE PROGRESS NOTE  Christina Fendt, NP [address] [place] [address]  DIAGNOSIS: Stage IIIB (T4, N2, M0) non-small cell lung cancer, poorly differentiated squamous cell carcinoma.  She presented with large right upper lobe lung mass in addition to right hilar and mediastinal lymphadenopathy diagnosed in January 2020.  PRIOR THERAPY:  1) Concurrent chemoradiation with weekly carboplatin for AUC of 2 and paclitaxel 45 mg/M2.  First dose July 13, 2018.  Status post 8 cycles.  Last dose was given August 31, 2018. 2) Consolidation treatment with immunotherapy with Imfinzi 10 mg/KG every 2 weeks.  First dose starts October 13, 2018.  Status post 26 cycles.  CURRENT THERAPY: Observation.  INTERVAL HISTORY: Christina [name] 78 y.o. female returns to the clinic today for 57-month follow-up visit.Discussed the use of AI scribe software for clinical note transcription with the patient, who gave verbal consent to proceed.  History of Present Illness   She is a 78 year old female with stage three B non-small cell lung cancer who presents for evaluation and repeat CT scan for restaging of her disease.  Diagnosed with squamous cell carcinoma in January 2020, she underwent concurrent chemoradiation with weekly carboplatin and paclitaxel, followed by one year of consolidation treatment with durvalumab. She has been on observation for almost four years.  In the last six months, she has no new complaints. No chest pain, breathing issues, nausea, vomiting, diarrhea, headaches, or changes in vision. She mentions gaining some weight recently.  A CT scan was performed last week, and she is currently awaiting the official report.       MEDICAL HISTORY: Past Medical History:  Diagnosis Date   Allergy    GERD (gastroesophageal reflux disease)    Hyperlipidemia    Hypertension    nscl ca dx'd 05/2018   lung  cancer   Reflux    Tubulovillous adenoma of colon 2003    ALLERGIES:  is allergic to coconut (cocos nucifera), codeine, shrimp [shellfish allergy], and trandolapril.  MEDICATIONS:  Current Outpatient Medications  Medication Sig Dispense Refill   albuterol (VENTOLIN HFA) 108 (90 Base) MCG/ACT inhaler INHALE 1 TO 2 PUFFS INTO THE LUNGS EVERY 6 HOURS AS NEEDED FOR WHEEZING OR SHORTNESS OF BREATH 6.7 g 3   amLODipine (NORVASC) 10 MG tablet TAKE 1 TABLET(10 MG) BY MOUTH DAILY 90 tablet 0   aspirin EC 81 MG tablet Take 81 mg by mouth daily.     atorvastatin (LIPITOR) 40 MG tablet Take 1 tablet (40 mg total) by mouth daily. 90 tablet 0   benzonatate (TESSALON) 100 MG capsule Take 1 capsule (100 mg total) by mouth every 8 (eight) hours. 21 capsule 0   fluticasone (FLONASE) 50 MCG/ACT nasal spray Place 2 sprays into both nostrils daily. 16 g 1   omeprazole (PRILOSEC) 40 MG capsule Take 1 capsule (40 mg total) by mouth daily. 90 capsule 0   potassium chloride (KLOR-CON M) 10 MEQ tablet TAKE 1 TABLET(10 MEQ) BY MOUTH TWICE DAILY 180 tablet 0   potassium chloride (KLOR-CON M) 10 MEQ tablet TAKE 1 TABLET(10 MEQ) BY MOUTH TWICE DAILY 180 tablet 0   sucralfate (CARAFATE) 1 g tablet TAKE 1 TABLET(1 GRAM) BY MOUTH FOUR TIMES DAILY AS NEEDED FOR STOMACH ACID 120 tablet 2   Tiotropium Bromide Monohydrate (SPIRIVA RESPIMAT) 2.5 MCG/ACT AERS Inhale 2 puffs into the lungs daily. 4 g 0   No  current facility-administered medications for this visit.    SURGICAL HISTORY:  Past Surgical History:  Procedure Laterality Date   CESAREAN SECTION     1 time   COLONOSCOPY     POLYPECTOMY     VIDEO BRONCHOSCOPY WITH ENDOBRONCHIAL NAVIGATION N/A 06/24/2018   Procedure: VIDEO BRONCHOSCOPY WITH ENDOBRONCHIAL NAVIGATION with biopsies, right upper lung lobe;  Surgeon: Loreli Slot, MD;  Location: [facility] OR;  Service: Thoracic;  Laterality: N/A;   VIDEO BRONCHOSCOPY WITH ENDOBRONCHIAL ULTRASOUND N/A 06/24/2018    Procedure: VIDEO BRONCHOSCOPY WITH ENDOBRONCHIAL ULTRASOUND, right upper lung lobe;  Surgeon: Loreli Slot, MD;  Location: [facility] OR;  Service: Thoracic;  Laterality: N/A;    REVIEW OF SYSTEMS:  A comprehensive review of systems was negative.   PHYSICAL EXAMINATION: General appearance: alert, cooperative, and no distress Head: Normocephalic, without obvious abnormality, atraumatic Neck: no adenopathy, no JVD, supple, symmetrical, trachea midline, and thyroid not enlarged, symmetric, no tenderness/mass/nodules Lymph nodes: Cervical, supraclavicular, and axillary nodes normal. Resp: clear to auscultation bilaterally Back: symmetric, no curvature. ROM normal. No CVA tenderness. Cardio: regular rate and rhythm, S1, S2 normal, no murmur, click, rub or gallop GI: soft, non-tender; bowel sounds normal; no masses,  no organomegaly Extremities: extremities normal, atraumatic, no cyanosis or edema  ECOG PERFORMANCE STATUS: 0 - Asymptomatic  Blood pressure 135/85, pulse 93, temperature 98 F (36.7 C), temperature source Temporal, resp. rate 16, height 5\' 8"  (1.727 m), weight 179 lb 3.2 oz (81.3 kg), SpO2 97%.  LABORATORY DATA: Lab Results  Component Value Date   WBC 6.2 09/23/2023   HGB 13.5 09/23/2023   HCT 41.5 09/23/2023   MCV 86.5 09/23/2023   PLT 349 09/23/2023      Chemistry      Component Value Date/Time   NA 139 09/23/2023 1239   NA 143 06/24/2023 0852   K 4.0 09/23/2023 1239   CL 104 09/23/2023 1239   CO2 29 09/23/2023 1239   BUN 16 09/23/2023 1239   BUN 19 06/24/2023 0852   CREATININE 0.92 09/23/2023 1239   CREATININE 0.87 03/22/2016 0908      Component Value Date/Time   CALCIUM 9.6 09/23/2023 1239   ALKPHOS 95 09/23/2023 1239   AST 15 09/23/2023 1239   ALT 11 09/23/2023 1239   BILITOT 0.5 09/23/2023 1239       RADIOGRAPHIC STUDIES: No results found.   ASSESSMENT AND PLAN: This is a very pleasant 78 years old African-American female recently diagnosed with  a stage IIIB (T4, N2, M0) non-small cell lung cancer, poorly differentiated squamous cell carcinoma presented with right upper lobe lung mass in addition to right hilar and mediastinal lymphadenopathy diagnosed in January 2020. The patient underwent concurrent chemoradiation with weekly carboplatin and paclitaxel status post 8 cycles. She has partial response to this treatment. She also completed a course of consolidation treatment with immunotherapy with Imfinzi every 2 weeks status post 26 cycles. The patient has been on observation since that time and she is feeling fine with no concerning complaints. She had repeat CT scan of the chest performed recently.  The final report is pending but I personally and independently reviewed the images in comparison to the previous scan 6 months ago and I do not see any concerning findings for disease progression.    Stage III B non-small cell lung cancer, squamous cell carcinoma Stage III B non-small cell lung cancer, squamous cell carcinoma, diagnosed in January 2020. Status post concurrent chemoradiation with weekly carboplatin and paclitaxel, followed by  one year of consolidation treatment with durvalumab. Currently on observation for almost four years with no new symptoms, including chest pain, dyspnea, nausea, vomiting, diarrhea, headaches, or vision changes. Recent CT scan images show no concerning findings, awaiting official report. - Await official CT scan report for restaging - Schedule follow-up in one year if CT scan is unremarkable - Adjust follow-up schedule if CT scan shows concerning findings - Instruct her to call if any concerning issues arise   The patient was advised to call immediately if she has any other concerning symptoms in the interval. The patient voices understanding of current disease status and treatment options and is in agreement with the current care plan.  All questions were answered. The patient knows to call the clinic with  any problems, questions or concerns. We can certainly see the patient much sooner if necessary.  Disclaimer: This note was dictated with voice recognition software. Similar sounding words can inadvertently be transcribed and may not be corrected upon review.

## 2023-10-01 ENCOUNTER — Telehealth: Payer: Self-pay | Admitting: Internal Medicine

## 2023-10-01 NOTE — Telephone Encounter (Signed)
 Left a voicemail with appointments scheduled around a scan expected date. The patient will be mailed an appointment reminder.

## 2023-10-02 ENCOUNTER — Other Ambulatory Visit: Payer: Self-pay | Admitting: Internal Medicine

## 2023-10-24 ENCOUNTER — Other Ambulatory Visit: Payer: Self-pay | Admitting: Family

## 2023-10-24 DIAGNOSIS — J3089 Other allergic rhinitis: Secondary | ICD-10-CM

## 2023-12-22 ENCOUNTER — Ambulatory Visit: Admitting: Family

## 2023-12-22 ENCOUNTER — Encounter: Payer: Self-pay | Admitting: Family

## 2023-12-22 VITALS — BP 123/83 | HR 98 | Temp 98.5°F | Resp 16 | Ht 68.0 in | Wt 180.0 lb

## 2023-12-22 DIAGNOSIS — I10 Essential (primary) hypertension: Secondary | ICD-10-CM | POA: Diagnosis not present

## 2023-12-22 DIAGNOSIS — J3089 Other allergic rhinitis: Secondary | ICD-10-CM

## 2023-12-22 DIAGNOSIS — K219 Gastro-esophageal reflux disease without esophagitis: Secondary | ICD-10-CM

## 2023-12-22 DIAGNOSIS — E876 Hypokalemia: Secondary | ICD-10-CM

## 2023-12-22 DIAGNOSIS — E785 Hyperlipidemia, unspecified: Secondary | ICD-10-CM

## 2023-12-22 MED ORDER — AMLODIPINE BESYLATE 10 MG PO TABS
ORAL_TABLET | ORAL | 0 refills | Status: DC
Start: 2023-12-22 — End: 2024-04-05

## 2023-12-22 MED ORDER — POTASSIUM CHLORIDE CRYS ER 10 MEQ PO TBCR: EXTENDED_RELEASE_TABLET | ORAL | 0 refills | Status: DC

## 2023-12-22 MED ORDER — FLUTICASONE PROPIONATE 50 MCG/ACT NA SUSP
2.0000 | Freq: Every day | NASAL | 1 refills | Status: DC
Start: 2023-12-22 — End: 2024-03-07

## 2023-12-22 MED ORDER — ATORVASTATIN CALCIUM 40 MG PO TABS
40.0000 mg | ORAL_TABLET | Freq: Every day | ORAL | 0 refills | Status: DC
Start: 2023-12-22 — End: 2024-03-22

## 2023-12-22 MED ORDER — OMEPRAZOLE 40 MG PO CPDR: 40.0000 mg | DELAYED_RELEASE_CAPSULE | Freq: Every day | ORAL | 0 refills | Status: DC

## 2023-12-22 NOTE — Progress Notes (Signed)
 3 month follow up patient said that the klor-conmider er tablet hurts her stomach really bad and the  potassium cl 10 mcg er tablets does not.  Patient don't understand why they were changed

## 2023-12-22 NOTE — Progress Notes (Signed)
 Patient ID: INDRIA BISHARA, female    DOB: February 18, 1946  MRN: [id number]  CC: Chronic Conditions Follow-Up  Subjective: Micaylah Bertucci is a 78 y.o. female who presents for chronic conditions follow-up.   Her concerns today include: - Doing well on Amlodipine , no issues/concerns. She does not complain of red flag symptoms such as but not limited to chest pain, shortness of breath, worst headache of life, nausea/vomiting.  - Doing well on Atorvastatin , no issues/concerns.  - Doing well on Omeprazole , no issues/concerns. - Doing well on Fluticasone  nasal spray, no issues/concerns.  - States she needs Potassium tablets. States the Potassium ER tablets causes stomachaches.  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     benzonatate  (TESSALON ) 100 MG capsule Take 1 capsule (100 mg total) by mouth every 8 (eight) hours. 21 capsule 0   potassium chloride  (KLOR-CON  M) 10 MEQ tablet TAKE 1  TABLET(10 MEQ) BY MOUTH TWICE DAILY (Patient not taking: Reported on 12/22/2023) 180 tablet 0   sucralfate  (CARAFATE ) 1 g tablet TAKE 1 TABLET(1 GRAM) BY MOUTH FOUR TIMES DAILY AS NEEDED FOR STOMACH ACID 120 tablet 2   Tiotropium Bromide Monohydrate  (SPIRIVA  RESPIMAT) 2.5 MCG/ACT AERS Inhale 2 puffs into the lungs daily. 4 g 0   No current facility-administered medications on file prior to visit.    Allergies  Allergen Reactions   Coconut (Cocos Nucifera) Hives   Codeine Other (See Comments)    Upsets stomach really bad   Shrimp [Shellfish Allergy] Hives   Trandolapril  Swelling    Swelling of the face/lips 07/2014    Social History   Socioeconomic History   Marital status: Married    Spouse name: Elsie   Number of children: 1   Years of education: 12th grade   Highest education level: Not on file  Occupational History   Occupation: school housekeeping    Comment: part-time  Tobacco Use   Smoking status: Former    Current packs/day: 0.50    Average packs/day: 0.5 packs/day for 50.0 years (25.0 ttl pk-yrs)    Types: Cigarettes    Passive exposure: Past   Smokeless tobacco: Never   Tobacco comments:    Previously quit in 2015 but has started smoking again   Vaping Use   Vaping status: Never Used  Substance and Sexual Activity   Alcohol use: No    Alcohol/week: 0.0 standard drinks of alcohol   Drug use: No   Sexual activity: Not Currently  Other Topics  Concern   Not on file  Social History Narrative   Lives with her husband. Her daughter also lives in [place] with her husband and 3 children.   Social Drivers of Corporate investment banker Strain: Low Risk  (03/24/2023)   Overall Financial Resource Strain (CARDIA)    Difficulty of Paying Living Expenses: Not very hard  Food Insecurity: No Food Insecurity (03/24/2023)   Hunger Vital Sign    Worried About Running Out of Food in the Last Year: Never true    Ran Out of Food in the Last Year: Never true  Transportation  Needs: No Transportation Needs (03/24/2023)   PRAPARE - Administrator, Civil Service (Medical): No    Lack of Transportation (Non-Medical): No  Physical Activity: Inactive (03/24/2023)   Exercise Vital Sign    Days of Exercise per Week: 0 days    Minutes of Exercise per Session: 0 min  Stress: No Stress Concern Present (03/24/2023)   [employer] of Occupational Health - Occupational Stress Questionnaire    Feeling of Stress : Not at all  Social Connections: Socially Integrated (03/24/2023)   Social Connection and Isolation Panel    Frequency of Communication with Friends and Family: Twice a week    Frequency of Social Gatherings with Friends and Family: Twice a week    Attends Religious Services: 1 to 4 times per year    Active Member of [employer] or Organizations: Yes    Attends Banker Meetings: 1 to 4 times per year    Marital Status: Living with partner  Intimate Partner Violence: Not At Risk (03/24/2023)   Humiliation, Afraid, Rape, and Kick questionnaire    Fear of Current or Ex-Partner: No    Emotionally Abused: No    Physically Abused: No    Sexually Abused: No    Family History  Problem Relation Age of Onset   Hypertension Mother    Heart disease Mother    Leukemia Father    Cancer Father    Hypertension Sister    Hypertension Sister    Colon cancer Neg Hx    Colon polyps Neg Hx    Stomach cancer Neg Hx    Esophageal cancer Neg Hx     Past Surgical History:  Procedure Laterality Date   CESAREAN SECTION     1 time   COLONOSCOPY     POLYPECTOMY     VIDEO BRONCHOSCOPY WITH ENDOBRONCHIAL NAVIGATION N/A 06/24/2018   Procedure: VIDEO BRONCHOSCOPY WITH ENDOBRONCHIAL NAVIGATION with biopsies, right upper lung lobe;  Surgeon: Kerrin Elspeth BROCKS, MD;  Location: [facility] OR;  Service: Thoracic;  Laterality: N/A;   VIDEO BRONCHOSCOPY WITH ENDOBRONCHIAL ULTRASOUND N/A 06/24/2018   Procedure: VIDEO BRONCHOSCOPY WITH ENDOBRONCHIAL ULTRASOUND, right upper  lung lobe;  Surgeon: Kerrin Elspeth BROCKS, MD;  Location: [facility] OR;  Service: Thoracic;  Laterality: N/A;    ROS: Review of Systems Negative except as stated above  PHYSICAL EXAM: BP 123/83   Pulse 98   Temp 98.5 F (36.9 C) (Oral)   Resp 16   Ht 5' 8 (1.727 m)   Wt 180 lb (81.6 kg)   SpO2 95%   BMI 27.37 kg/m   Physical Exam HENT:     Head: Normocephalic and atraumatic.     Nose: Nose normal.     Mouth/Throat:     Mouth: Mucous membranes are moist.     Pharynx: Oropharynx is clear.  Eyes:     Extraocular Movements: Extraocular movements  intact.     Conjunctiva/sclera: Conjunctivae normal.     Pupils: Pupils are equal, round, and reactive to light.  Cardiovascular:     Rate and Rhythm: Normal rate and regular rhythm.     Pulses: Normal pulses.     Heart sounds: Normal heart sounds.  Pulmonary:     Effort: Pulmonary effort is normal.     Breath sounds: Normal breath sounds.  Musculoskeletal:        General: Normal range of motion.     Cervical back: Normal range of motion and neck supple.  Neurological:     General: No focal deficit present.     Mental Status: She is alert and oriented to person, place, and time.  Psychiatric:        Mood and Affect: Mood normal.        Behavior: Behavior normal.     ASSESSMENT AND PLAN: 1. Primary hypertension (Primary) - Continue Amlodipine  as prescribed.  - Counseled on blood pressure goal of less than 130/80, low-sodium, DASH diet, medication compliance, and 150 minutes of moderate intensity exercise per week as tolerated. Counseled on medication adherence and adverse effects. - Follow-up with primary provider in 3 months or sooner if needed. - amLODipine  (NORVASC ) 10 MG tablet; TAKE 1 TABLET(10 MG) BY MOUTH DAILY  Dispense: 90 tablet; Refill: 0  2. Hyperlipidemia, unspecified hyperlipidemia type - Continue Atorvastatin  as prescribed. Counseled on medication adherence/adverse effects.  - Follow-up with primary provider in 3  months or sooner if needed. - atorvastatin  (LIPITOR) 40 MG tablet; Take 1 tablet (40 mg total) by mouth daily.  Dispense: 90 tablet; Refill: 0  3. Gastroesophageal reflux disease without esophagitis - Continue Omeprazole  as prescribed. Counseled on medication adherence/adverse effects.  - Follow-up with primary provider in 3 months or sooner if needed. - omeprazole  (PRILOSEC) 40 MG capsule; Take 1 capsule (40 mg total) by mouth daily.  Dispense: 90 capsule; Refill: 0  4. Perennial allergic rhinitis - Continue Fluticasone  nasal spray as prescribed. Counseled on medication adherence/adverse effects.  - Follow-up with primary provider in 3 months or sooner if needed. - fluticasone  (FLONASE ) 50 MCG/ACT nasal spray; Place 2 sprays into both nostrils daily.  Dispense: 16 g; Refill: 1  5. Hypokalemia - Continue Potassium Chloride  as prescribed. Counseled on medication adherence/adverse effects.  - Follow-up with primary provider in 3 months or sooner if needed. - potassium chloride  (KLOR-CON  M) 10 MEQ tablet; TAKE 1 TABLET(10 MEQ) BY MOUTH TWICE DAILY  Dispense: 180 tablet; Refill: 0   Patient was given the opportunity to ask questions.  Patient verbalized understanding of the plan and was able to repeat key elements of the plan. Patient was given clear instructions to go to Emergency Department or return to medical center if symptoms don't improve, worsen, or new problems develop.The patient verbalized understanding.   Requested Prescriptions   Signed Prescriptions Disp Refills   amLODipine  (NORVASC ) 10 MG tablet 90 tablet 0    Sig: TAKE 1 TABLET(10 MG) BY MOUTH DAILY   atorvastatin  (LIPITOR) 40 MG tablet 90 tablet 0    Sig: Take 1 tablet (40 mg total) by mouth daily.   omeprazole  (PRILOSEC) 40 MG capsule 90 capsule 0    Sig: Take 1 capsule (40 mg total) by mouth daily.   fluticasone  (FLONASE ) 50 MCG/ACT nasal spray 16 g 1    Sig: Place 2 sprays into both nostrils daily.   potassium  chloride (KLOR-CON  M) 10 MEQ tablet 180 tablet 0  Sig: TAKE 1 TABLET(10 MEQ) BY MOUTH TWICE DAILY    Return in about 3 months (around 03/23/2024) for Follow-Up or next available chronic conditions.  Greig JINNY Drones, NP

## 2023-12-23 ENCOUNTER — Inpatient Hospital Stay: Attending: Internal Medicine

## 2023-12-23 ENCOUNTER — Ambulatory Visit (HOSPITAL_COMMUNITY)
Admission: RE | Admit: 2023-12-23 | Discharge: 2023-12-23 | Disposition: A | Discharge status: Home / Self Care | Source: Ambulatory Visit | Attending: Internal Medicine | Admitting: Internal Medicine

## 2023-12-23 DIAGNOSIS — Z85118 Personal history of other malignant neoplasm of bronchus and lung: Secondary | ICD-10-CM | POA: Insufficient documentation

## 2023-12-23 DIAGNOSIS — Z9221 Personal history of antineoplastic chemotherapy: Secondary | ICD-10-CM | POA: Insufficient documentation

## 2023-12-23 DIAGNOSIS — C3490 Malignant neoplasm of unspecified part of unspecified bronchus or lung: Secondary | ICD-10-CM

## 2023-12-23 DIAGNOSIS — Z923 Personal history of irradiation: Secondary | ICD-10-CM | POA: Diagnosis not present

## 2023-12-23 LAB — CBC WITH DIFFERENTIAL (CANCER CENTER ONLY)
Abs Immature Granulocytes: 0.02 K/uL (ref 0.00–0.07)
Basophils Absolute: 0.1 K/uL (ref 0.0–0.1)
Basophils Relative: 1 %
Eosinophils Absolute: 0.2 K/uL (ref 0.0–0.5)
Eosinophils Relative: 3 %
HCT: 40.2 % (ref 36.0–46.0)
Hemoglobin: 13.2 g/dL (ref 12.0–15.0)
Immature Granulocytes: 0 %
Lymphocytes Relative: 25 %
Lymphs Abs: 1.6 K/uL (ref 0.7–4.0)
MCH: 28.2 pg (ref 26.0–34.0)
MCHC: 32.8 g/dL (ref 30.0–36.0)
MCV: 85.9 fL (ref 80.0–100.0)
Monocytes Absolute: 0.5 K/uL (ref 0.1–1.0)
Monocytes Relative: 9 %
Neutro Abs: 3.8 K/uL (ref 1.7–7.7)
Neutrophils Relative %: 62 %
Platelet Count: 323 K/uL (ref 150–400)
RBC: 4.68 MIL/uL (ref 3.87–5.11)
RDW: 15.3 % (ref 11.5–15.5)
WBC Count: 6.2 K/uL (ref 4.0–10.5)
nRBC: 0 % (ref 0.0–0.2)

## 2023-12-23 LAB — CMP (CANCER CENTER ONLY)
ALT: 13 U/L (ref 0–44)
AST: 16 U/L (ref 15–41)
Albumin: 4 g/dL (ref 3.5–5.0)
Alkaline Phosphatase: 96 U/L (ref 38–126)
Anion gap: 6 (ref 5–15)
BUN: 19 mg/dL (ref 8–23)
CO2: 29 mmol/L (ref 22–32)
Calcium: 10.1 mg/dL (ref 8.9–10.3)
Chloride: 105 mmol/L (ref 98–111)
Creatinine: 1.06 mg/dL — ABNORMAL HIGH (ref 0.44–1.00)
GFR, Estimated: 54 mL/min — ABNORMAL LOW (ref >60)
Glucose, Bld: 95 mg/dL (ref 70–99)
Potassium: 4.1 mmol/L (ref 3.5–5.1)
Sodium: 140 mmol/L (ref 135–145)
Total Bilirubin: 0.5 mg/dL (ref 0.0–1.2)
Total Protein: 7.1 g/dL (ref 6.5–8.1)

## 2023-12-23 MED ORDER — IOHEXOL 300 MG/ML  SOLN
75.0000 mL | Freq: Once | INTRAMUSCULAR | Status: AC | PRN
  Administered 2023-12-23: 75 mL via INTRAVENOUS

## 2023-12-30 ENCOUNTER — Inpatient Hospital Stay: Admitting: Internal Medicine

## 2023-12-30 VITALS — BP 135/88 | HR 93 | Temp 98.0°F | Resp 17 | Ht 68.0 in | Wt 184.0 lb

## 2023-12-30 DIAGNOSIS — Z85118 Personal history of other malignant neoplasm of bronchus and lung: Secondary | ICD-10-CM | POA: Diagnosis not present

## 2023-12-30 DIAGNOSIS — C3490 Malignant neoplasm of unspecified part of unspecified bronchus or lung: Secondary | ICD-10-CM

## 2023-12-30 NOTE — Progress Notes (Signed)
 [facility] [phone]   [phone]  OFFICE PROGRESS NOTE  Christina Greig PARAS, NP [address] [place] [address]  DIAGNOSIS: Stage IIIB (T4, N2, M0) non-small cell lung cancer, poorly differentiated squamous cell carcinoma.  She presented with large right upper lobe lung mass in addition to right hilar and mediastinal lymphadenopathy diagnosed in January 2020.  PRIOR THERAPY:  1) Concurrent chemoradiation with weekly carboplatin  for AUC of 2 and paclitaxel  45 mg/M2.  First dose July 13, 2018.  Status post 8 cycles.  Last dose was given August 31, 2018. 2) Consolidation treatment with immunotherapy with Imfinzi  10 mg/KG every 2 weeks.  First dose starts October 13, 2018.  Status post 26 cycles.  CURRENT THERAPY: Observation.  INTERVAL HISTORY: Christina [name] 78 y.o. female returns to the clinic today for 91-month follow-up visit.Discussed the use of AI scribe software for clinical note transcription with the patient, who gave verbal consent to proceed.  History of Present Illness Christina [name] is a 78 year old female with stage 3B non-small cell lung cancer who presents for evaluation and repeat CT scan for restaging of her disease.  Diagnosed with stage 3B non-small cell lung cancer, squamous cell carcinoma, in January 2020, she completed concurrent chemoradiation followed by one year of consolidation treatment with immunotherapy, concluding in April 2021. Since then, she has been under observation.  She feels good with no new issues or complaints since her last visit. No chest pain, breathing issues, headaches, or recent weight loss. She mentions gaining a few [name] and wants to lose weight.  A previous scan led to a recommendation for a repeat scan after three months. She is here today for the evaluation of this repeat CT scan.    MEDICAL HISTORY: Past Medical History:  Diagnosis Date   Allergy    GERD (gastroesophageal  reflux disease)    Hyperlipidemia    Hypertension    nscl ca dx'd 05/2018   lung cancer   Reflux    Tubulovillous adenoma of colon 2003    ALLERGIES:  is allergic to coconut (cocos nucifera), codeine, shrimp [shellfish allergy], and trandolapril .  MEDICATIONS:  Current Outpatient Medications  Medication Sig Dispense Refill   albuterol  (VENTOLIN  HFA) 108 (90 Base) MCG/ACT inhaler INHALE 1 TO 2 PUFFS INTO THE LUNGS EVERY 6 HOURS AS NEEDED FOR WHEEZING OR SHORTNESS OF BREATH 6.7 g 3   amLODipine  (NORVASC ) 10 MG tablet TAKE 1 TABLET(10 MG) BY MOUTH DAILY 90 tablet 0   aspirin EC 81 MG tablet Take 81 mg by mouth daily.     atorvastatin  (LIPITOR) 40 MG tablet Take 1 tablet (40 mg total) by mouth daily. 90 tablet 0   benzonatate  (TESSALON ) 100 MG capsule Take 1 capsule (100 mg total) by mouth every 8 (eight) hours. 21 capsule 0   fluticasone  (FLONASE ) 50 MCG/ACT nasal spray Place 2 sprays into both nostrils daily. 16 g 1   omeprazole  (PRILOSEC) 40 MG capsule Take 1 capsule (40 mg total) by mouth daily. 90 capsule 0   potassium chloride  (KLOR-CON  M) 10 MEQ tablet TAKE 1 TABLET(10 MEQ) BY MOUTH TWICE DAILY (Patient not taking: Reported on 12/22/2023) 180 tablet 0   potassium chloride  (KLOR-CON  M) 10 MEQ tablet TAKE 1 TABLET(10 MEQ) BY MOUTH TWICE DAILY 180 tablet 0   sucralfate  (CARAFATE ) 1 g tablet TAKE 1 TABLET(1 GRAM) BY MOUTH FOUR TIMES DAILY AS NEEDED FOR STOMACH ACID 120 tablet 2  Tiotropium Bromide Monohydrate  (SPIRIVA  RESPIMAT) 2.5 MCG/ACT AERS Inhale 2 puffs into the lungs daily. 4 g 0   No current facility-administered medications for this visit.    SURGICAL HISTORY:  Past Surgical History:  Procedure Laterality Date   CESAREAN SECTION     1 time   COLONOSCOPY     POLYPECTOMY     VIDEO BRONCHOSCOPY WITH ENDOBRONCHIAL NAVIGATION N/A 06/24/2018   Procedure: VIDEO BRONCHOSCOPY WITH ENDOBRONCHIAL NAVIGATION with biopsies, right upper lung lobe;  Surgeon: Kerrin Elspeth BROCKS, MD;   Location: [facility] OR;  Service: Thoracic;  Laterality: N/A;   VIDEO BRONCHOSCOPY WITH ENDOBRONCHIAL ULTRASOUND N/A 06/24/2018   Procedure: VIDEO BRONCHOSCOPY WITH ENDOBRONCHIAL ULTRASOUND, right upper lung lobe;  Surgeon: Kerrin Elspeth BROCKS, MD;  Location: [facility] OR;  Service: Thoracic;  Laterality: N/A;    REVIEW OF SYSTEMS:  A comprehensive review of systems was negative.   PHYSICAL EXAMINATION: General appearance: alert, cooperative, and no distress Head: Normocephalic, without obvious abnormality, atraumatic Neck: no adenopathy, no JVD, supple, symmetrical, trachea midline, and thyroid  not enlarged, symmetric, no tenderness/mass/nodules Lymph nodes: Cervical, supraclavicular, and axillary nodes normal. Resp: clear to auscultation bilaterally Back: symmetric, no curvature. ROM normal. No CVA tenderness. Cardio: regular rate and rhythm, S1, S2 normal, no murmur, click, rub or gallop GI: soft, non-tender; bowel sounds normal; no masses,  no organomegaly Extremities: extremities normal, atraumatic, no cyanosis or edema  ECOG PERFORMANCE STATUS: 0 - Asymptomatic  Blood pressure 135/88, pulse 93, temperature 98 F (36.7 C), resp. rate 17, height 5' 8 (1.727 m), weight 184 lb (83.5 kg), SpO2 93%.  LABORATORY DATA: Lab Results  Component Value Date   WBC 6.2 12/23/2023   HGB 13.2 12/23/2023   HCT 40.2 12/23/2023   MCV 85.9 12/23/2023   PLT 323 12/23/2023      Chemistry      Component Value Date/Time   NA 140 12/23/2023 0930   NA 143 06/24/2023 0852   K 4.1 12/23/2023 0930   CL 105 12/23/2023 0930   CO2 29 12/23/2023 0930   BUN 19 12/23/2023 0930   BUN 19 06/24/2023 0852   CREATININE 1.06 (H) 12/23/2023 0930   CREATININE 0.87 03/22/2016 0908      Component Value Date/Time   CALCIUM  10.1 12/23/2023 0930   ALKPHOS 96 12/23/2023 0930   AST 16 12/23/2023 0930   ALT 13 12/23/2023 0930   BILITOT 0.5 12/23/2023 0930       RADIOGRAPHIC STUDIES: CT Chest W Contrast Result Date:  12/23/2023 CLINICAL DATA:  Non-small cell lung cancer. Restaging. * Tracking Code: BO * EXAM: CT CHEST WITH CONTRAST TECHNIQUE: Multidetector CT imaging of the chest was performed during intravenous contrast administration. RADIATION DOSE REDUCTION: This exam was performed according to the departmental dose-optimization program which includes automated exposure control, adjustment of the mA and/or kV according to patient size and/or use of iterative reconstruction technique. CONTRAST:  75mL OMNIPAQUE  IOHEXOL  300 MG/ML  SOLN COMPARISON:  Chest CT 09/23/2023 and 04/01/2023. FINDINGS: Cardiovascular: No acute vascular findings. Atherosclerosis of the aorta, great vessels and coronary arteries. Focal anterior pericardial effusion is similar to the previous study. The heart size remains normal. Mediastinum/Nodes: Stable treatment changes in the right hilar region with soft tissue thickening and distortion extending into the subcarinal region. No discretely enlarged mediastinal, hilar or axillary lymph nodes. Grossly stable complex 2.8 cm left thyroid  nodule. Mild mid esophageal wall thickening attributed to prior treatment. Lungs/Pleura: No pleural effusion or pneumothorax. Stable treatment changes in the right perihilar region  with volume loss, architectural distortion and chronic radiation changes inferiorly in the right upper lobe. These findings are stable from the most recent study, without evidence of local recurrence. No suspicious pulmonary nodularity. Upper abdomen: The visualized upper abdomen appears stable without suspicious findings. There is a stable cyst in the right hepatic lobe and mild thickening of the adrenal glands consistent with benign hyperplasia. Musculoskeletal/Chest wall: There is no chest wall mass or suspicious osseous finding. IMPRESSION: 1. Evolving radiation changes in the right perihilar region, unchanged from most recent prior study of 3 months prior. No evidence of local recurrence or  metastatic disease. 2. Stable complex left thyroid  nodule. If not previously evaluated or contraindicated by the patient's comorbidities, consider further evaluation with thyroid  ultrasound.(Ref: J Am Coll Radiol. 2015 Feb;12(2): 143-50). 3. Coronary and aortic Atherosclerosis (ICD10-I70.0). Electronically Signed   By: Elsie Perone M.D.   On: 12/23/2023 18:53     ASSESSMENT AND PLAN: This is a very pleasant 78 years old African-American female recently diagnosed with a stage IIIB (T4, N2, M0) non-small cell lung cancer, poorly differentiated squamous cell carcinoma presented with right upper lobe lung mass in addition to right hilar and mediastinal lymphadenopathy diagnosed in January 2020. The patient underwent concurrent chemoradiation with weekly carboplatin  and paclitaxel  status post 8 cycles. She has partial response to this treatment. She also completed a course of consolidation treatment with immunotherapy with Imfinzi  every 2 weeks status post 26 cycles. The patient is currently on observation. She had repeat CT scan of the chest performed recently.  The scan showed no concerning findings for disease recurrence or metastasis. Assessment and Plan Assessment & Plan Stage III B non-small cell lung cancer, squamous cell carcinoma 78 year old female with stage III B non-small cell lung cancer, squamous cell carcinoma, diagnosed in January 2020. Status post concurrent chemoradiation and one year of consolidation immunotherapy, completed in April 2021. Recent CT scan for restaging showed no evidence of disease recurrence, only radiation changes. She reports no current symptoms or complaints. - Schedule follow-up appointment in six months with repeat CT scan for continued monitoring. The patient was advised to call immediately if she has any concerning symptoms in the interval.  The patient voices understanding of current disease status and treatment options and is in agreement with the current  care plan.  All questions were answered. The patient knows to call the clinic with any problems, questions or concerns. We can certainly see the patient much sooner if necessary.  Disclaimer: This note was dictated with voice recognition software. Similar sounding words can inadvertently be transcribed and may not be corrected upon review.

## 2024-01-28 ENCOUNTER — Telehealth: Admitting: Physician Assistant

## 2024-01-28 ENCOUNTER — Telehealth

## 2024-01-28 DIAGNOSIS — J441 Chronic obstructive pulmonary disease with (acute) exacerbation: Secondary | ICD-10-CM

## 2024-01-28 MED ORDER — PREDNISONE 20 MG PO TABS: 40.0000 mg | ORAL_TABLET | Freq: Every day | ORAL | 0 refills | Status: DC

## 2024-01-28 MED ORDER — AMOXICILLIN-POT CLAVULANATE 875-125 MG PO TABS: 1.0000 | ORAL_TABLET | Freq: Two times a day (BID) | ORAL | 0 refills | Status: DC

## 2024-01-28 MED ORDER — BENZONATATE 100 MG PO CAPS: 100.0000 mg | ORAL_CAPSULE | Freq: Three times a day (TID) | ORAL | 0 refills | Status: DC | PRN

## 2024-01-28 NOTE — Patient Instructions (Signed)
 Christina [name], thank you for joining Delon CHRISTELLA Dickinson, PA-C for today's virtual visit.  While this provider is not your primary care provider (PCP), if your PCP is located in our provider database this encounter information will be shared with them immediately following your visit.   A [facility] MyChart account gives you access to today's visit and all your visits, tests, and labs performed at [facility]  click here if you don't have a [place] MyChart account or go to [email or web address]  Consent: (Patient) Christina [name] provided verbal consent for this virtual visit at the beginning of the encounter.  Current Medications:  Current Outpatient Medications:    amoxicillin -clavulanate (AUGMENTIN ) 875-125 MG tablet, Take 1 tablet by mouth 2 (two) times daily., Disp: 20 tablet, Rfl: 0   benzonatate  (TESSALON ) 100 MG capsule, Take 1-2 capsules (100-200 mg total) by mouth 3 (three) times daily as needed., Disp: 30 capsule, Rfl: 0   predniSONE  (DELTASONE ) 20 MG tablet, Take 2 tablets (40 mg total) by mouth daily with breakfast., Disp: 14 tablet, Rfl: 0   albuterol  (VENTOLIN  HFA) 108 (90 Base) MCG/ACT inhaler, INHALE 1 TO 2 PUFFS INTO THE LUNGS EVERY 6 HOURS AS NEEDED FOR WHEEZING OR SHORTNESS OF BREATH, Disp: 6.7 g, Rfl: 3   amLODipine  (NORVASC ) 10 MG tablet, TAKE 1 TABLET(10 MG) BY MOUTH DAILY, Disp: 90 tablet, Rfl: 0   aspirin EC 81 MG tablet, Take 81 mg by mouth daily., Disp: , Rfl:    atorvastatin  (LIPITOR) 40 MG tablet, Take 1 tablet (40 mg total) by mouth daily., Disp: 90 tablet, Rfl: 0   fluticasone  (FLONASE ) 50 MCG/ACT nasal spray, Place 2 sprays into both nostrils daily., Disp: 16 g, Rfl: 1   omeprazole  (PRILOSEC) 40 MG capsule, Take 1 capsule (40 mg total) by mouth daily., Disp: 90 capsule, Rfl: 0   potassium chloride  (KLOR-CON  M) 10 MEQ tablet, TAKE 1 TABLET(10 MEQ) BY MOUTH TWICE DAILY (Patient not taking: Reported on 12/22/2023), Disp: 180 tablet, Rfl: 0    potassium chloride  (KLOR-CON  M) 10 MEQ tablet, TAKE 1 TABLET(10 MEQ) BY MOUTH TWICE DAILY, Disp: 180 tablet, Rfl: 0   sucralfate  (CARAFATE ) 1 g tablet, TAKE 1 TABLET(1 GRAM) BY MOUTH FOUR TIMES DAILY AS NEEDED FOR STOMACH ACID, Disp: 120 tablet, Rfl: 2   Tiotropium Bromide Monohydrate  (SPIRIVA  RESPIMAT) 2.5 MCG/ACT AERS, Inhale 2 puffs into the lungs daily., Disp: 4 g, Rfl: 0   Medications ordered in this encounter:  Meds ordered this encounter  Medications   amoxicillin -clavulanate (AUGMENTIN ) 875-125 MG tablet    Sig: Take 1 tablet by mouth 2 (two) times daily.    Dispense:  20 tablet    Refill:  0    Supervising Provider:   LAMPTEY, PHILIP O [[id number]]   predniSONE  (DELTASONE ) 20 MG tablet    Sig: Take 2 tablets (40 mg total) by mouth daily with breakfast.    Dispense:  14 tablet    Refill:  0    Supervising Provider:   BLAISE ALEENE KIDD [[id number]]   benzonatate  (TESSALON ) 100 MG capsule    Sig: Take 1-2 capsules (100-200 mg total) by mouth 3 (three) times daily as needed.    Dispense:  30 capsule    Refill:  0    Supervising Provider:   BLAISE ALEENE KIDD [[id number]]     *If you need refills on other medications prior to your next appointment, please contact your pharmacy*  Follow-Up: Call back or seek an in-person evaluation if the symptoms  worsen or if the condition fails to improve as anticipated.  [facility] [phone]  Other Instructions Chronic Obstructive Pulmonary Disease Exacerbation  Chronic obstructive pulmonary disease (COPD) is a long-term (chronic) lung problem. When you have COPD, it can feel harder to breathe in or out. COPD exacerbation is a flare-up of symptoms when breathing gets worse and more treatment may be needed. Without treatment, flare-ups can be life-threatening. If they happen often, your lungs can become more damaged. What are the causes? Not taking your usual COPD medicines as told by your health care provider. A cold or the flu,  which can cause infection in your lungs. Being exposed to things that make your breathing worse, such as: Smoke. Air pollution. Fumes. Dust. Allergies. Weather changes. What are the signs or symptoms? Symptoms do not get better or get worse even if you take your medicines as told by your provider. Symptoms may include: More shortness of breath. You may only be able to speak one or two words at a time. More coughing or mucus from your lungs. More wheezing or chest tightness. Being more tired and having less energy. Confusion. How is this diagnosed? This condition is diagnosed based on: Symptoms that get worse. Your medical history. A physical exam. You may also have tests, including: A chest X-ray. Blood or mucus tests. How is this treated? You may be able to stay home or you may need to go to the hospital. Treatment may include: Taking medicines. These may include: Inhalers. These have medicines in them that you breathe in. These may be more of what you already take or they may be new. Steroids. These reduce inflammation in the airways. These may be inhaled, taken by mouth, or given in an IV. Antibiotics. These treat infection. Using oxygen. Using a device to help you clear mucus. Follow these instructions at home: Medicines Take your medicines only as told by your provider. If you were given antibiotics or steroids, take them as told by your provider. Do not stop taking them even if you start to feel better. Lifestyle Several times a day, wash your hands with soap and water for at least 20 seconds. If you cannot use soap and water, use hand sanitizer. This may help keep you from getting an infection. Avoid being around crowds or people who are sick. Do not smoke or use any products that contain nicotine or tobacco. If you need help quitting, ask your provider. Return to your normal activities when your provider says that it's safe. Use breathing methods to control your  stress and catch your breath. How is this prevented? Follow your COPD action plan. The action plan tells you what to do if you're feeling good and what to do when you start feeling worse. Discuss the plan often with your provider. Make sure you get all the shots, also called vaccines, that your provider recommends. Ask your provider about a flu shot and a pneumonia shot. Use oxygen therapy if told by your provider. If you need home oxygen therapy, ask your provider how often to check your oxygen level with a device called an oximeter. Keep all follow-up visits to review your COPD action plan. Your provider will want to check on your condition often to keep you healthy and out of the hospital. Contact a health care provider if: Your COPD symptoms get worse. You have a fever or chills. You have trouble doing daily activities. You have trouble breathing even when you are resting. Get  help right away if: You are short of breath and cannot: Talk in full sentences. Do normal activities. You have chest pain. You feel confused. These symptoms may be an emergency. Call 911 right away. Do not wait to see if the symptoms will go away. Do not drive yourself to the hospital. This information is not intended to replace advice given to you by your health care provider. Make sure you discuss any questions you have with your health care provider. Document Revised: 03/06/2023 Document Reviewed: 08/19/2022 Elsevier Patient Education  2024 Elsevier Inc.   If you have been instructed to have an in-person evaluation today at a local Urgent Care facility, please use the link below. It will take you to a list of all of our available [facility], including address, phone number and hours of operation. Please do not delay care.  [facility]  If you or a family member do not have a primary care provider, use the link below to schedule a visit and establish care. When you choose a [facility] primary care physician or advanced practice provider, you gain a long-term partner in health. Find a Primary Care Provider  Learn more about [facility] in-office and virtual care options: [place] - Get Care Now

## 2024-01-28 NOTE — Progress Notes (Signed)
 Virtual Visit Consent   Christina [name], you are scheduled for a virtual visit with a [facility] provider today. Just as with appointments in the office, your consent must be obtained to participate. Your consent will be active for this visit and any virtual visit you may have with one of our providers in the next 365 days. If you have a MyChart account, a copy of this consent can be sent to you electronically.  As this is a virtual visit, video technology does not allow for your provider to perform a traditional examination. This may limit your provider's ability to fully assess your condition. If your provider identifies any concerns that need to be evaluated in person or the need to arrange testing (such as labs, EKG, etc.), we will make arrangements to do so. Although advances in technology are sophisticated, we cannot ensure that it will always work on either your end or our end. If the connection with a video visit is poor, the visit may have to be switched to a telephone visit. With either a video or telephone visit, we are not always able to ensure that we have a secure connection.  By engaging in this virtual visit, you consent to the provision of healthcare and authorize for your insurance to be billed (if applicable) for the services provided during this visit. Depending on your insurance coverage, you may receive a charge related to this service.  I need to obtain your verbal consent now. Are you willing to proceed with your visit today? Christina [name] has provided verbal consent on 01/28/2024 for a virtual visit (video or telephone). Christina CHRISTELLA Dickinson, PA-C  Date: 01/28/2024 10:47 AM   Virtual Visit via Video Note   I, Christina [name], connected with  Christina [name]  ([id number], [birth year]) on 01/28/24 at 10:45 AM EDT by a video-enabled telemedicine application and verified that I am speaking with the correct person using two identifiers.  Location: Patient: Virtual Visit  Location Patient: Home Provider: Virtual Visit Location Provider: Home Office   I discussed the limitations of evaluation and management by telemedicine and the availability of in person appointments. The patient expressed understanding and agreed to proceed.    History of Present Illness: Christina [name] is a 78 y.o. who identifies as a female who was assigned female at birth, and is being seen today for cough and congestion.  HPI: URI  This is a new problem. The current episode started 1 to 4 weeks ago (1.5 weeks). The problem has been unchanged. Maximum temperature: low grade 100. The fever has been present for 1 to 2 days. Associated symptoms include chest pain, congestion, coughing (deep, dry mostly; occasional mucus production), headaches, a plugged ear sensation (right), rhinorrhea (and post nasal drainage) and sinus pain. Pertinent negatives include no diarrhea, ear pain, nausea, sore throat, vomiting or wheezing. Associated symptoms comments: Denies shortness of breath. She has tried inhaler use (mucinex , sudafed) for the symptoms. The treatment provided no relief.  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   Angioedema 08/04/2014   GERD (gastroesophageal reflux disease) 08/10/2013   Other and unspecified hyperlipidemia 06/23/2013   Essential hypertension 07/23/2012    Allergies:  Allergies  Allergen  Reactions   Coconut (Cocos Nucifera) Hives   Codeine Other (See Comments)    Upsets stomach really bad   Shrimp [Shellfish Allergy] Hives   Trandolapril  Swelling    Swelling of the face/lips 07/2014   Medications:  Current Outpatient Medications:    amoxicillin -clavulanate (AUGMENTIN ) 875-125 MG tablet, Take 1 tablet by mouth 2 (two) times daily., Disp: 20 tablet, Rfl: 0   benzonatate  (TESSALON ) 100 MG capsule, Take 1-2 capsules (100-200 mg total) by mouth 3 (three) times daily as needed., Disp: 30 capsule, Rfl: 0   predniSONE  (DELTASONE ) 20 MG tablet, Take 2 tablets (40 mg total) by mouth daily with breakfast., Disp: 14 tablet, Rfl: 0   albuterol  (VENTOLIN  HFA) 108 (90 Base) MCG/ACT inhaler, INHALE 1 TO 2 PUFFS INTO THE LUNGS EVERY 6 HOURS AS NEEDED FOR WHEEZING OR SHORTNESS OF BREATH, Disp: 6.7 g, Rfl: 3   amLODipine  (NORVASC ) 10 MG tablet, TAKE 1 TABLET(10 MG) BY MOUTH DAILY, Disp: 90 tablet, Rfl: 0   aspirin EC 81 MG tablet, Take 81 mg by mouth daily., Disp: , Rfl:    atorvastatin  (LIPITOR) 40 MG tablet, Take 1 tablet (40 mg total) by mouth daily., Disp: 90 tablet, Rfl: 0   fluticasone  (FLONASE ) 50 MCG/ACT nasal spray, Place 2 sprays into both nostrils daily., Disp: 16 g, Rfl: 1   omeprazole  (PRILOSEC) 40 MG capsule, Take 1 capsule (40 mg total) by mouth daily., Disp: 90 capsule, Rfl: 0   potassium chloride  (KLOR-CON  M) 10 MEQ tablet, TAKE 1 TABLET(10 MEQ) BY MOUTH TWICE DAILY (Patient not taking: Reported on 12/22/2023), Disp: 180 tablet, Rfl: 0   potassium chloride  (KLOR-CON  M) 10 MEQ tablet, TAKE 1 TABLET(10 MEQ) BY MOUTH TWICE DAILY, Disp: 180 tablet, Rfl: 0   sucralfate  (CARAFATE ) 1 g tablet, TAKE 1 TABLET(1 GRAM) BY MOUTH FOUR TIMES DAILY AS NEEDED FOR STOMACH ACID, Disp: 120 tablet, Rfl: 2   Tiotropium Bromide Monohydrate  (SPIRIVA  RESPIMAT) 2.5 MCG/ACT AERS, Inhale 2 puffs into the lungs daily., Disp: 4 g, Rfl: 0  Observations/Objective: Patient is well-developed, well-nourished in  no acute distress.  Resting comfortably at home.  Head is normocephalic, atraumatic.  No labored breathing.  Speech is clear and coherent with logical content.  Patient is alert and oriented at baseline.  Dry , bronchial cough heard twice not affecting speech  Assessment and Plan: 1. COPD with acute exacerbation (HCC) (Primary) - amoxicillin -clavulanate (AUGMENTIN ) 875-125 MG tablet; Take 1 tablet by mouth 2 (two) times daily.  Dispense: 20 tablet; Refill: 0 - predniSONE  (DELTASONE ) 20 MG tablet; Take 2 tablets (40 mg total) by mouth daily with breakfast.  Dispense: 14 tablet; Refill: 0 - benzonatate  (TESSALON ) 100 MG capsule; Take 1-2 capsules (100-200 mg total) by mouth 3 (three) times daily as needed.  Dispense: 30 capsule; Refill: 0  - Worsening over a week despite OTC medications - Will treat with Augmentin , Prednisone , and tessalon  perles - Can continue Mucinex   - Push fluids.  - Rest.  - Steam and humidifier can help - Seek in person evaluation if worsening or symptoms fail to improve    Follow Up Instructions: I discussed the assessment and treatment plan with the patient. The patient was provided an opportunity to ask  questions and all were answered. The patient agreed with the plan and demonstrated an understanding of the instructions.  A copy of instructions were sent to the patient via MyChart unless otherwise noted below.    The patient was advised to call back or seek an in-person evaluation if the symptoms worsen or if the condition fails to improve as anticipated.    Christina CHRISTELLA Dickinson, PA-C

## 2024-03-07 ENCOUNTER — Telehealth: Admitting: Family

## 2024-03-07 DIAGNOSIS — J069 Acute upper respiratory infection, unspecified: Secondary | ICD-10-CM | POA: Diagnosis not present

## 2024-03-07 MED ORDER — BENZONATATE 100 MG PO CAPS: 100.0000 mg | ORAL_CAPSULE | Freq: Three times a day (TID) | ORAL | 0 refills | Status: DC | PRN

## 2024-03-07 MED ORDER — FLUTICASONE PROPIONATE 50 MCG/ACT NA SUSP: 2.0000 | Freq: Every day | NASAL | 6 refills | Status: DC

## 2024-03-07 NOTE — Progress Notes (Signed)

## 2024-03-07 NOTE — Progress Notes (Signed)
Approximately 5 minutes was spent documenting and reviewing patient's chart.

## 2024-03-10 DIAGNOSIS — R0602 Shortness of breath: Secondary | ICD-10-CM | POA: Diagnosis not present

## 2024-03-10 DIAGNOSIS — R062 Wheezing: Secondary | ICD-10-CM | POA: Diagnosis not present

## 2024-03-19 ENCOUNTER — Telehealth: Payer: Self-pay | Admitting: Family

## 2024-03-19 NOTE — Telephone Encounter (Unsigned)
 Copied from CRM #[id number]. Topic: Clinical - Medication Question >> Mar 19, 2024 12:43 PM [name] wrote: Reason for CRM: Patient would like to know if it's possible for all of her medications to be generic due to Medicare will not pay unless its generic. She did not give specific ones but just stated the medications that she is currently being prescribed.

## 2024-03-22 ENCOUNTER — Other Ambulatory Visit: Payer: Self-pay | Admitting: Family

## 2024-03-22 DIAGNOSIS — E785 Hyperlipidemia, unspecified: Secondary | ICD-10-CM

## 2024-03-22 NOTE — Telephone Encounter (Unsigned)
 Copied from CRM [id number]. Topic: Clinical - Medication Refill >> Mar 22, 2024  3:51 PM Delon HERO wrote: Medication:  atorvastatin  (LIPITOR) 40 MG tablet [19177]      Has the patient contacted their pharmacy? Yes (Agent: If no, request that the patient contact the pharmacy for the refill. If patient does not wish to contact the pharmacy document the reason why and proceed with request.) (Agent: If yes, when and what did the pharmacy advise?)  This is the patient's preferred pharmacy:  [facility] at   [address] [place], [place] [address]      Is this the correct pharmacy for this prescription? Yes If no, delete pharmacy and type the correct one.   Has the prescription been filled recently? Yes  Is the patient out of the medication? Yes  Has the patient been seen for an appointment in the last year OR does the patient have an upcoming appointment? Yes  Can we respond through MyChart? {yes/no:20286}  Agent: Please be advised that Rx refills may take up to 3 business days. We ask that you follow-up with your pharmacy.

## 2024-03-24 MED ORDER — ATORVASTATIN CALCIUM 40 MG PO TABS: 40.0000 mg | ORAL_TABLET | Freq: Every day | ORAL | 0 refills | Status: DC

## 2024-03-24 NOTE — Telephone Encounter (Signed)
 Requested Prescriptions  Pending Prescriptions Disp Refills   atorvastatin  (LIPITOR) 40 MG tablet 90 tablet 0    Sig: Take 1 tablet (40 mg total) by mouth daily.     Cardiovascular:  Antilipid - Statins Failed - 03/24/2024 11:49 AM      Failed - Lipid Panel in normal range within the last 12 months    Cholesterol, Total  Date Value Ref Range Status  03/24/2023 174 100 - 199 mg/dL Final   LDL Chol Calc (NIH)  Date Value Ref Range Status  03/24/2023 103 (H) 0 - 99 mg/dL Final   HDL  Date Value Ref Range Status  03/24/2023 54 >39 mg/dL Final   Triglycerides  Date Value Ref Range Status  03/24/2023 91 0 - 149 mg/dL Final         Passed - Patient is not pregnant      Passed - Valid encounter within last 12 months    Recent Outpatient Visits           3 months ago Primary hypertension   [facility], Amy J, NP   6 months ago Primary hypertension   [facility], Amy J, NP   9 months ago Primary hypertension   [facility], [place], NP   1 year ago Primary hypertension   [facility], [place], NP   1 year ago Primary hypertension    [facility] at [facility], Greig PARAS, NP

## 2024-04-05 ENCOUNTER — Encounter: Payer: Self-pay | Admitting: Family

## 2024-04-05 ENCOUNTER — Ambulatory Visit (INDEPENDENT_AMBULATORY_CARE_PROVIDER_SITE_OTHER): Admitting: Family

## 2024-04-05 VITALS — BP 125/84 | HR 88 | Temp 98.2°F | Resp 16 | Ht 68.0 in | Wt 176.0 lb

## 2024-04-05 DIAGNOSIS — E876 Hypokalemia: Secondary | ICD-10-CM

## 2024-04-05 DIAGNOSIS — Z23 Encounter for immunization: Secondary | ICD-10-CM | POA: Diagnosis not present

## 2024-04-05 DIAGNOSIS — K219 Gastro-esophageal reflux disease without esophagitis: Secondary | ICD-10-CM

## 2024-04-05 DIAGNOSIS — J3089 Other allergic rhinitis: Secondary | ICD-10-CM

## 2024-04-05 DIAGNOSIS — I10 Essential (primary) hypertension: Secondary | ICD-10-CM

## 2024-04-05 DIAGNOSIS — E785 Hyperlipidemia, unspecified: Secondary | ICD-10-CM | POA: Diagnosis not present

## 2024-04-05 MED ORDER — POTASSIUM CHLORIDE CRYS ER 10 MEQ PO TBCR: EXTENDED_RELEASE_TABLET | ORAL | 0 refills | Status: DC

## 2024-04-05 MED ORDER — AMLODIPINE BESYLATE 10 MG PO TABS: ORAL_TABLET | ORAL | 0 refills | Status: DC

## 2024-04-05 MED ORDER — ATORVASTATIN CALCIUM 40 MG PO TABS: 40.0000 mg | ORAL_TABLET | Freq: Every day | ORAL | 0 refills | Status: DC

## 2024-04-05 MED ORDER — FLUTICASONE PROPIONATE 50 MCG/ACT NA SUSP: 2.0000 | Freq: Every day | NASAL | 2 refills | Status: DC

## 2024-04-05 MED ORDER — OMEPRAZOLE 40 MG PO CPDR: 40.0000 mg | DELAYED_RELEASE_CAPSULE | Freq: Every day | ORAL | 0 refills | Status: DC

## 2024-04-05 NOTE — Progress Notes (Signed)
 Patient ID: Christina [name], female    DOB: [birth year]  MRN: [id number]  CC: Chronic Conditions Follow-Up  Subjective: Christina [name] is a 78 y.o. female who presents for chronic conditions follow-up.   Her concerns today include:  - Doing well on Amlodipine , no issues/concerns. She does not complain of red flag symptoms such as but not limited to chest pain, shortness of breath, worst headache of life, nausea/vomiting.  - Doing well on Atorvastatin , no issues/concerns.  - Doing well on Omeprazole , no issues/concerns.  - Doing well on Fluticasone , no issues/concerns.  - Doing well on Potassium Chloride , no issues/concerns.   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     benzonatate  (TESSALON  PERLES) 100 MG capsule Take 1 capsule (100 mg total) by mouth 3 (three) times daily as needed. 20 capsule 0   potassium chloride  (KLOR-CON  M) 10 MEQ tablet TAKE 1 TABLET(10 MEQ) BY MOUTH TWICE  DAILY 180 tablet 0   sucralfate  (CARAFATE ) 1 g tablet TAKE 1 TABLET(1 GRAM) BY MOUTH FOUR TIMES DAILY AS NEEDED FOR STOMACH ACID 120 tablet 2   Tiotropium Bromide Monohydrate  (SPIRIVA  RESPIMAT) 2.5 MCG/ACT AERS Inhale 2 puffs into the lungs daily. 4 g 0   amoxicillin -clavulanate (AUGMENTIN ) 875-125 MG tablet Take 1 tablet by mouth 2 (two) times daily. 20 tablet 0   predniSONE  (DELTASONE ) 20 MG tablet Take 2 tablets (40 mg total) by mouth daily with breakfast. 14 tablet 0   No current facility-administered medications on file prior to visit.    Allergies  Allergen Reactions   Coconut (Cocos Nucifera) Hives   Codeine Other (See Comments)    Upsets stomach really bad   Shrimp [Shellfish Allergy] Hives   Trandolapril  Swelling    Swelling of the face/lips 07/2014    Social History   Socioeconomic History   Marital status: Married    Spouse name: Elsie   Number of children: 1   Years of education: 12th grade   Highest education level: Not on file  Occupational History   Occupation: school housekeeping    Comment: part-time  Tobacco Use   Smoking status: Former    Current packs/day: 0.50    Average packs/day: 0.5 packs/day for 50.0 years (25.0 ttl pk-yrs)    Types: Cigarettes    Passive exposure: Past   Smokeless tobacco: Never   Tobacco comments:    Previously quit in 2015 but has started smoking again   Vaping Use   Vaping status: Never Used  Substance and Sexual Activity   Alcohol use: No    Alcohol/week: 0.0 standard drinks of alcohol   Drug use: No   Sexual activity: Not Currently  Other Topics Concern   Not on file  Social History Narrative   Lives with her husband. Her daughter also lives in [place] with her husband and 3 children.   Social Drivers of Corporate investment banker Strain: Low Risk  (03/24/2023)   Overall Financial Resource Strain (CARDIA)    Difficulty of Paying Living Expenses: Not very hard  Food Insecurity: No Food Insecurity (03/24/2023)    Hunger Vital Sign    Worried About Running Out of Food in the Last Year: Never true    Ran Out of Food in the Last Year: Never true  Transportation Needs: No Transportation Needs (03/24/2023)   PRAPARE - Administrator, Civil Service (Medical): No    Lack of Transportation (Non-Medical): No  Physical Activity: Inactive (03/24/2023)   Exercise Vital Sign    Days of Exercise per Week: 0 days    Minutes of Exercise per Session: 0 min  Stress: No Stress Concern Present (03/24/2023)   [employer] of Occupational Health - Occupational Stress Questionnaire    Feeling of Stress : Not at all  Social Connections: Socially Integrated (03/24/2023)   Social Connection and Isolation Panel    Frequency of Communication with Friends and Family: Twice a week    Frequency of Social Gatherings with Friends and Family: Twice a week    Attends Religious Services: 1 to 4 times per year    Active Member of [employer] or Organizations: Yes    Attends Banker Meetings: 1 to 4 times per year    Marital Status: Living with partner  Intimate Partner Violence: Not At Risk (03/24/2023)   Humiliation, Afraid, Rape, and Kick questionnaire    Fear of Current or Ex-Partner: No    Emotionally Abused: No    Physically Abused: No    Sexually Abused: No    Family History  Problem Relation Age of Onset   Hypertension Mother    Heart disease Mother    Leukemia Father    Cancer Father    Hypertension Sister    Hypertension Sister    Colon cancer Neg Hx    Colon polyps Neg Hx    Stomach cancer Neg Hx    Esophageal cancer Neg Hx     Past Surgical History:  Procedure Laterality Date   CESAREAN SECTION     1 time   COLONOSCOPY     POLYPECTOMY     VIDEO BRONCHOSCOPY WITH ENDOBRONCHIAL NAVIGATION N/A 06/24/2018   Procedure: VIDEO BRONCHOSCOPY WITH ENDOBRONCHIAL NAVIGATION with biopsies, right upper lung lobe;  Surgeon: Kerrin Elspeth BROCKS, MD;  Location: [facility] OR;  Service: Thoracic;   Laterality: N/A;   VIDEO BRONCHOSCOPY WITH ENDOBRONCHIAL ULTRASOUND N/A 06/24/2018   Procedure: VIDEO BRONCHOSCOPY WITH ENDOBRONCHIAL ULTRASOUND, right upper lung lobe;  Surgeon: Kerrin Elspeth BROCKS, MD;  Location: [facility] OR;  Service: Thoracic;  Laterality: N/A;    ROS: Review of Systems Negative except as stated above  PHYSICAL EXAM: BP 125/84   Pulse 88   Temp 98.2 F (36.8 C) (Oral)   Resp 16   Ht 5' 8 (1.727 m)   Wt 176 lb (79.8 kg)   SpO2 95%   BMI 26.76 kg/m   Physical Exam HENT:     Head: Normocephalic and atraumatic.     Nose: Nose normal.  Mouth/Throat:     Mouth: Mucous membranes are moist.     Pharynx: Oropharynx is clear.  Eyes:     Extraocular Movements: Extraocular movements intact.     Conjunctiva/sclera: Conjunctivae normal.     Pupils: Pupils are equal, round, and reactive to light.  Cardiovascular:     Rate and Rhythm: Normal rate and regular rhythm.     Pulses: Normal pulses.     Heart sounds: Normal heart sounds.  Pulmonary:     Effort: Pulmonary effort is normal.     Breath sounds: Normal breath sounds.  Musculoskeletal:        General: Normal range of motion.     Cervical back: Normal range of motion and neck supple.  Neurological:     General: No focal deficit present.     Mental Status: She is alert and oriented to person, place, and time.  Psychiatric:        Mood and Affect: Mood normal.        Behavior: Behavior normal.    ASSESSMENT AND PLAN: 1. Primary hypertension (Primary) - Continue Amlodipine  as prescribed.  - Routine screening.  - Counseled on blood pressure goal of less than 130/80, low-sodium, DASH diet, medication compliance, and 150 minutes of moderate intensity exercise per week as tolerated. Counseled on medication adherence and adverse effects. - Follow-up with primary provider in 3 months or sooner if needed.  - amLODipine  (NORVASC ) 10 MG tablet; TAKE 1 TABLET(10 MG) BY MOUTH DAILY  Dispense: 90 tablet; Refill: 0 -  Basic Metabolic Panel  2. Hyperlipidemia, unspecified hyperlipidemia type - Continue Atorvastatin  as prescribed. Counseled on medication adherence/adverse effects.  - Routine screening.  - Follow-up with primary provider as scheduled.  - atorvastatin  (LIPITOR) 40 MG tablet; Take 1 tablet (40 mg total) by mouth daily.  Dispense: 90 tablet; Refill: 0 - Lipid panel  3. Gastroesophageal reflux disease without esophagitis - Continue Omeprazole  as prescribed. Counseled on medication adherence/adverse effects.  - Follow-up with primary provider in 3 months or sooner if needed. - omeprazole  (PRILOSEC) 40 MG capsule; Take 1 capsule (40 mg total) by mouth daily.  Dispense: 90 capsule; Refill: 0  4. Perennial allergic rhinitis - Continue Fluticasone  as prescribed. Counseled on medication adherence/adverse effects.  - Follow-up with primary provider in 3 months or sooner if needed. - fluticasone  (FLONASE ) 50 MCG/ACT nasal spray; Place 2 sprays into both nostrils daily.  Dispense: 16 g; Refill: 2  5. Hypokalemia - Continue Potassium Chloride  as prescribed. Counseled on medication adherence/adverse effects.  - Follow-up with primary provider in 3 months or sooner if needed. - potassium chloride  (KLOR-CON  M) 10 MEQ tablet; TAKE 1 TABLET(10 MEQ) BY MOUTH TWICE DAILY  Dispense: 180 tablet; Refill: 0  6. Immunization due - Administered.  - Flu vaccine HIGH DOSE PF(Fluzone Trivalent)   Patient was given the opportunity to ask questions.  Patient verbalized understanding of the plan and was able to repeat key elements of the plan. Patient was given clear instructions to go to Emergency Department or return to medical center if symptoms don't improve, worsen, or new problems develop.The patient verbalized understanding.   Orders Placed This Encounter  Procedures   Flu vaccine HIGH DOSE PF(Fluzone Trivalent)   Basic Metabolic Panel   Lipid panel     Requested Prescriptions   Signed Prescriptions  Disp Refills   amLODipine  (NORVASC ) 10 MG tablet 90 tablet 0    Sig: TAKE 1 TABLET(10 MG) BY MOUTH DAILY   atorvastatin  (LIPITOR) 40  MG tablet 90 tablet 0    Sig: Take 1 tablet (40 mg total) by mouth daily.   omeprazole  (PRILOSEC) 40 MG capsule 90 capsule 0    Sig: Take 1 capsule (40 mg total) by mouth daily.   fluticasone  (FLONASE ) 50 MCG/ACT nasal spray 16 g 2    Sig: Place 2 sprays into both nostrils daily.   potassium chloride  (KLOR-CON  M) 10 MEQ tablet 180 tablet 0    Sig: TAKE 1 TABLET(10 MEQ) BY MOUTH TWICE DAILY    Return in about 3 months (around 07/06/2024) for Follow-Up or next available chronic conditions.  Greig JINNY Drones, NP

## 2024-04-05 NOTE — Progress Notes (Signed)
 3 month follow-up

## 2024-04-06 ENCOUNTER — Ambulatory Visit: Payer: Self-pay | Admitting: Family

## 2024-04-06 DIAGNOSIS — N1831 Chronic kidney disease, stage 3a: Secondary | ICD-10-CM

## 2024-04-06 LAB — BASIC METABOLIC PANEL WITH GFR
BUN/Creatinine Ratio: 14 (ref 12–28)
BUN: 15 mg/dL (ref 8–27)
CO2: 25 mmol/L (ref 20–29)
Calcium: 9.8 mg/dL (ref 8.7–10.3)
Chloride: 101 mmol/L (ref 96–106)
Creatinine, Ser: 1.05 mg/dL — ABNORMAL HIGH (ref 0.57–1.00)
Glucose: 84 mg/dL (ref 70–99)
Potassium: 4.6 mmol/L (ref 3.5–5.2)
Sodium: 142 mmol/L (ref 134–144)
eGFR: 55 mL/min/1.73 — ABNORMAL LOW (ref >59)

## 2024-04-06 LAB — LIPID PANEL
Chol/HDL Ratio: 2.7 ratio (ref 0.0–4.4)
Cholesterol, Total: 164 mg/dL (ref 100–199)
HDL: 61 mg/dL (ref >39)
LDL Chol Calc (NIH): 89 mg/dL (ref 0–99)
Triglycerides: 74 mg/dL (ref 0–149)
VLDL Cholesterol Cal: 14 mg/dL (ref 5–40)

## 2024-04-26 LAB — LAB REPORT - SCANNED
Albumin, Urine POC: 12.8
Creatinine, POC: 121.3 mg/dL
Microalb Creat Ratio: 11
PTH, Intact: 74

## 2024-04-27 ENCOUNTER — Other Ambulatory Visit: Payer: Self-pay | Admitting: Nephrology

## 2024-04-27 DIAGNOSIS — N1831 Chronic kidney disease, stage 3a: Secondary | ICD-10-CM

## 2024-05-03 ENCOUNTER — Ambulatory Visit
Admission: RE | Admit: 2024-05-03 | Discharge: 2024-05-03 | Disposition: A | Discharge status: Home / Self Care | Source: Ambulatory Visit | Attending: Nephrology | Admitting: Nephrology

## 2024-05-03 DIAGNOSIS — N1831 Chronic kidney disease, stage 3a: Secondary | ICD-10-CM

## 2024-05-05 ENCOUNTER — Telehealth: Admitting: Physician Assistant

## 2024-05-05 DIAGNOSIS — J019 Acute sinusitis, unspecified: Secondary | ICD-10-CM

## 2024-05-05 DIAGNOSIS — B9689 Other specified bacterial agents as the cause of diseases classified elsewhere: Secondary | ICD-10-CM | POA: Diagnosis not present

## 2024-05-05 MED ORDER — AMOXICILLIN-POT CLAVULANATE 875-125 MG PO TABS: 1.0000 | ORAL_TABLET | Freq: Two times a day (BID) | ORAL | 0 refills | Status: DC

## 2024-05-05 NOTE — Patient Instructions (Signed)
 Christina [name], thank you for joining Delon CHRISTELLA Dickinson, PA-C for today's virtual visit.  While this provider is not your primary care provider (PCP), if your PCP is located in our provider database this encounter information will be shared with them immediately following your visit.   A [facility] MyChart account gives you access to today's visit and all your visits, tests, and labs performed at [facility]  click here if you don't have a [facility] MyChart account or go to [email or web address]  Consent: (Patient) Christina [name] provided verbal consent for this virtual visit at the beginning of the encounter.  Current Medications:  Current Outpatient Medications:    amoxicillin -clavulanate (AUGMENTIN ) 875-125 MG tablet, Take 1 tablet by mouth 2 (two) times daily., Disp: 14 tablet, Rfl: 0   albuterol  (VENTOLIN  HFA) 108 (90 Base) MCG/ACT inhaler, INHALE 1 TO 2 PUFFS INTO THE LUNGS EVERY 6 HOURS AS NEEDED FOR WHEEZING OR SHORTNESS OF BREATH, Disp: 6.7 g, Rfl: 3   amLODipine  (NORVASC ) 10 MG tablet, TAKE 1 TABLET(10 MG) BY MOUTH DAILY, Disp: 90 tablet, Rfl: 0   aspirin EC 81 MG tablet, Take 81 mg by mouth daily., Disp: , Rfl:    atorvastatin  (LIPITOR) 40 MG tablet, Take 1 tablet (40 mg total) by mouth daily., Disp: 90 tablet, Rfl: 0   benzonatate  (TESSALON  PERLES) 100 MG capsule, Take 1 capsule (100 mg total) by mouth 3 (three) times daily as needed., Disp: 20 capsule, Rfl: 0   fluticasone  (FLONASE ) 50 MCG/ACT nasal spray, Place 2 sprays into both nostrils daily., Disp: 16 g, Rfl: 2   omeprazole  (PRILOSEC) 40 MG capsule, Take 1 capsule (40 mg total) by mouth daily., Disp: 90 capsule, Rfl: 0   potassium chloride  (KLOR-CON  M) 10 MEQ tablet, TAKE 1 TABLET(10 MEQ) BY MOUTH TWICE DAILY, Disp: 180 tablet, Rfl: 0   potassium chloride  (KLOR-CON  M) 10 MEQ tablet, TAKE 1 TABLET(10 MEQ) BY MOUTH TWICE DAILY, Disp: 180 tablet, Rfl: 0   predniSONE  (DELTASONE ) 20 MG tablet, Take 2 tablets  (40 mg total) by mouth daily with breakfast., Disp: 14 tablet, Rfl: 0   sucralfate  (CARAFATE ) 1 g tablet, TAKE 1 TABLET(1 GRAM) BY MOUTH FOUR TIMES DAILY AS NEEDED FOR STOMACH ACID, Disp: 120 tablet, Rfl: 2   Tiotropium Bromide Monohydrate  (SPIRIVA  RESPIMAT) 2.5 MCG/ACT AERS, Inhale 2 puffs into the lungs daily., Disp: 4 g, Rfl: 0   Medications ordered in this encounter:  Meds ordered this encounter  Medications   amoxicillin -clavulanate (AUGMENTIN ) 875-125 MG tablet    Sig: Take 1 tablet by mouth 2 (two) times daily.    Dispense:  14 tablet    Refill:  0    Supervising Provider:   BLAISE ALEENE KIDD [[id number]]     *If you need refills on other medications prior to your next appointment, please contact your pharmacy*  Follow-Up: Call back or seek an in-person evaluation if the symptoms worsen or if the condition fails to improve as anticipated.  [facility] [phone]  Other Instructions Sinus Infection, Adult A sinus infection, also called sinusitis, is inflammation of your sinuses. Sinuses are hollow spaces in the bones around your face. Your sinuses are located: Around your eyes. In the middle of your forehead. Behind your nose. In your cheekbones. Mucus normally drains out of your sinuses. When your nasal tissues become inflamed or swollen, mucus can become trapped or blocked. This allows bacteria, viruses, and fungi to grow, which leads to infection. Most infections of the  sinuses are caused by a virus. A sinus infection can develop quickly. It can last for up to 4 weeks (acute) or for more than 12 weeks (chronic). A sinus infection often develops after a cold. What are the causes? This condition is caused by anything that creates swelling in the sinuses or stops mucus from draining. This includes: Allergies. Asthma. Infection from bacteria or viruses. Deformities or blockages in your nose or sinuses. Abnormal growths in the nose (nasal polyps). Pollutants,  such as chemicals or irritants in the air. Infection from fungi. This is rare. What increases the risk? You are more likely to develop this condition if you: Have a weak body defense system (immune system). Do a lot of swimming or diving. Overuse nasal sprays. Smoke. What are the signs or symptoms? The main symptoms of this condition are pain and a feeling of pressure around the affected sinuses. Other symptoms include: Stuffy nose or congestion that makes it difficult to breathe through your nose. Thick yellow or greenish drainage from your nose. Tenderness, swelling, and warmth over the affected sinuses. A cough that may get worse at night. Decreased sense of smell and taste. Extra mucus that collects in the throat or the back of the nose (postnasal drip) causing a sore throat or bad breath. Tiredness (fatigue). Fever. How is this diagnosed? This condition is diagnosed based on: Your symptoms. Your medical history. A physical exam. Tests to find out if your condition is acute or chronic. This may include: Checking your nose for nasal polyps. Viewing your sinuses using a device that has a light (endoscope). Testing for allergies or bacteria. Imaging tests, such as an MRI or CT scan. In rare cases, a bone biopsy may be done to rule out more serious types of fungal sinus disease. How is this treated? Treatment for a sinus infection depends on the cause and whether your condition is chronic or acute. If caused by a virus, your symptoms should go away on their own within 10 days. You may be given medicines to relieve symptoms. They include: Medicines that shrink swollen nasal passages (decongestants). A spray that eases inflammation of the nostrils (topical intranasal corticosteroids). Rinses that help get rid of thick mucus in your nose (nasal saline washes). Medicines that treat allergies (antihistamines). Over-the-counter pain relievers. If caused by bacteria, your health care  provider may recommend waiting to see if your symptoms improve. Most bacterial infections will get better without antibiotic medicine. You may be given antibiotics if you have: A severe infection. A weak immune system. If caused by narrow nasal passages or nasal polyps, surgery may be needed. Follow these instructions at home: Medicines Take, use, or apply over-the-counter and prescription medicines only as told by your health care provider. These may include nasal sprays. If you were prescribed an antibiotic medicine, take it as told by your health care provider. Do not stop taking the antibiotic even if you start to feel better. Hydrate and humidify  Drink enough fluid to keep your urine pale yellow. Staying hydrated will help to thin your mucus. Use a cool mist humidifier to keep the humidity level in your home above 50%. Inhale steam for 10-15 minutes, 3-4 times a day, or as told by your health care provider. You can do this in the bathroom while a hot shower is running. Limit your exposure to cool or dry air. Rest Rest as much as possible. Sleep with your head raised (elevated). Make sure you get enough sleep each night. General instructions  Apply a warm, moist washcloth to your face 3-4 times a day or as told by your health care provider. This will help with discomfort. Use nasal saline washes as often as told by your health care provider. Wash your hands often with soap and water to reduce your exposure to germs. If soap and water are not available, use hand sanitizer. Do not smoke. Avoid being around people who are smoking (secondhand smoke). Keep all follow-up visits. This is important. Contact a health care provider if: You have a fever. Your symptoms get worse. Your symptoms do not improve within 10 days. Get help right away if: You have a severe headache. You have persistent vomiting. You have severe pain or swelling around your face or eyes. You have vision  problems. You develop confusion. Your neck is stiff. You have trouble breathing. These symptoms may be an emergency. Get help right away. Call 911. Do not wait to see if the symptoms will go away. Do not drive yourself to the hospital. Summary A sinus infection is soreness and inflammation of your sinuses. Sinuses are hollow spaces in the bones around your face. This condition is caused by nasal tissues that become inflamed or swollen. The swelling traps or blocks the flow of mucus. This allows bacteria, viruses, and fungi to grow, which leads to infection. If you were prescribed an antibiotic medicine, take it as told by your health care provider. Do not stop taking the antibiotic even if you start to feel better. Keep all follow-up visits. This is important. This information is not intended to replace advice given to you by your health care provider. Make sure you discuss any questions you have with your health care provider. Document Revised: 05/08/2021 Document Reviewed: 05/08/2021 Elsevier Patient Education  2024 Elsevier Inc.   If you have been instructed to have an in-person evaluation today at a local Urgent Care facility, please use the link below. It will take you to a list of all of our available [facility], including address, phone number and hours of operation. Please do not delay care.  [facility]  If you or a family member do not have a primary care provider, use the link below to schedule a visit and establish care. When you choose a [facility] primary care physician or advanced practice provider, you gain a long-term partner in health. Find a Primary Care Provider  Learn more about [facility] in-office and virtual care options: [facility] Get Care Now

## 2024-05-05 NOTE — Progress Notes (Signed)
 Virtual Visit Consent   Christina [name], you are scheduled for a virtual visit with a [facility] provider today. Just as with appointments in the office, your consent must be obtained to participate. Your consent will be active for this visit and any virtual visit you may have with one of our providers in the next 365 days. If you have a MyChart account, a copy of this consent can be sent to you electronically.  As this is a virtual visit, video technology does not allow for your provider to perform a traditional examination. This may limit your provider's ability to fully assess your condition. If your provider identifies any concerns that need to be evaluated in person or the need to arrange testing (such as labs, EKG, etc.), we will make arrangements to do so. Although advances in technology are sophisticated, we cannot ensure that it will always work on either your end or our end. If the connection with a video visit is poor, the visit may have to be switched to a telephone visit. With either a video or telephone visit, we are not always able to ensure that we have a secure connection.  By engaging in this virtual visit, you consent to the provision of healthcare and authorize for your insurance to be billed (if applicable) for the services provided during this visit. Depending on your insurance coverage, you may receive a charge related to this service.  I need to obtain your verbal consent now. Are you willing to proceed with your visit today? Christina [name] has provided verbal consent on 05/05/2024 for a virtual visit (video or telephone). Delon CHRISTELLA Dickinson, PA-C  Date: 05/05/2024 12:50 PM   Virtual Visit via Video Note   IDelon CHRISTELLA Dickinson, connected with  LAVAYA DEFREITAS  ([id number], 1946/03/12) on 05/05/24 at 12:45 PM EST by a video-enabled telemedicine application and verified that I am speaking with the correct person using two identifiers.  Location: Patient: Virtual Visit  Location Patient: Home Provider: Virtual Visit Location Provider: Home Office   I discussed the limitations of evaluation and management by telemedicine and the availability of in person appointments. The patient expressed understanding and agreed to proceed.    History of Present Illness: Christina [name] is a 78 y.o. who identifies as a female who was assigned female at birth, and is being seen today for sinus congestion.  HPI: Sinusitis This is a new problem. The current episode started 1 to 4 weeks ago (2 weeks). The problem has been gradually worsening since onset. There has been no fever. The pain is moderate. Associated symptoms include congestion, coughing (dry), headaches and sinus pressure. Pertinent negatives include no chills, diaphoresis, ear pain, shortness of breath, sneezing or sore throat. Treatments tried: flonase , saline nasal rinse, mucinex . The treatment provided no relief.     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   Angioedema 08/04/2014   GERD (gastroesophageal reflux disease) 08/10/2013   Other and  unspecified hyperlipidemia 06/23/2013   Essential hypertension 07/23/2012    Allergies:  Allergies  Allergen Reactions   Coconut (Cocos Nucifera) Hives   Codeine Other (See Comments)    Upsets stomach really bad   Shrimp [Shellfish Allergy] Hives   Trandolapril  Swelling    Swelling of the face/lips 07/2014    Medications:  Current Outpatient Medications:    amoxicillin -clavulanate (AUGMENTIN ) 875-125 MG tablet, Take 1 tablet by mouth 2 (two) times daily., Disp: 14 tablet, Rfl: 0   albuterol  (VENTOLIN  HFA) 108 (90 Base) MCG/ACT inhaler, INHALE 1 TO 2 PUFFS INTO THE LUNGS EVERY 6 HOURS AS NEEDED FOR WHEEZING OR SHORTNESS OF BREATH, Disp: 6.7 g, Rfl: 3   amLODipine  (NORVASC ) 10 MG tablet, TAKE 1 TABLET(10 MG) BY MOUTH DAILY, Disp: 90 tablet, Rfl: 0   aspirin EC 81 MG tablet, Take 81 mg by mouth daily., Disp: , Rfl:    atorvastatin  (LIPITOR) 40 MG tablet, Take 1 tablet (40 mg total) by mouth daily., Disp: 90 tablet, Rfl: 0   benzonatate  (TESSALON  PERLES) 100 MG capsule, Take 1 capsule (100 mg total) by mouth 3 (three) times daily as needed., Disp: 20 capsule, Rfl: 0   fluticasone  (FLONASE ) 50 MCG/ACT nasal spray, Place 2 sprays into both nostrils daily., Disp: 16 g, Rfl: 2   omeprazole  (PRILOSEC) 40 MG capsule, Take 1 capsule (40 mg total) by mouth daily., Disp: 90 capsule, Rfl: 0   potassium chloride  (KLOR-CON  M) 10 MEQ tablet, TAKE 1 TABLET(10 MEQ) BY MOUTH TWICE DAILY, Disp: 180 tablet, Rfl: 0   potassium chloride  (KLOR-CON  M) 10 MEQ tablet, TAKE 1 TABLET(10 MEQ) BY MOUTH TWICE DAILY, Disp: 180 tablet, Rfl: 0   predniSONE  (DELTASONE ) 20 MG tablet, Take 2 tablets (40 mg total) by mouth daily with breakfast., Disp: 14 tablet, Rfl: 0   sucralfate  (CARAFATE ) 1 g tablet, TAKE 1 TABLET(1 GRAM) BY MOUTH FOUR TIMES DAILY AS NEEDED FOR STOMACH ACID, Disp: 120 tablet, Rfl: 2   Tiotropium Bromide Monohydrate  (SPIRIVA  RESPIMAT) 2.5 MCG/ACT AERS, Inhale 2 puffs into the lungs daily., Disp: 4 g, Rfl: 0  Observations/Objective: Patient is well-developed, well-nourished in no acute distress.  Resting comfortably at home.  Head is normocephalic, atraumatic.  No labored breathing.  Speech is clear and coherent with logical content.  Patient is alert and oriented at baseline.    Assessment and Plan: 1. Acute  bacterial sinusitis (Primary) - amoxicillin -clavulanate (AUGMENTIN ) 875-125 MG tablet; Take 1 tablet by mouth 2 (two) times daily.  Dispense: 14 tablet; Refill: 0  - Worsening symptoms that have not responded to OTC medications.  - Will give Augmentin  - Continue allergy medications.  - Steam and humidifier can help - Stay well hydrated and get plenty of rest.  - Seek in person evaluation if no symptom improvement or if symptoms worsen   Follow Up Instructions: I discussed the assessment and treatment plan with the patient. The patient was provided an opportunity to ask questions and all were answered. The patient agreed with the plan and demonstrated an understanding of the instructions.  A copy of instructions were sent to the patient via MyChart unless otherwise noted below.    The patient was advised to call back or seek an in-person evaluation if the symptoms worsen or if the condition fails to improve as anticipated.    Delon CHRISTELLA Dickinson, PA-C

## 2024-05-06 ENCOUNTER — Other Ambulatory Visit: Payer: Self-pay | Admitting: Emergency Medicine

## 2024-05-06 DIAGNOSIS — J3089 Other allergic rhinitis: Secondary | ICD-10-CM

## 2024-05-06 DIAGNOSIS — R062 Wheezing: Secondary | ICD-10-CM

## 2024-05-06 NOTE — Telephone Encounter (Signed)
 Patient is requesting a refill

## 2024-05-07 MED ORDER — ALBUTEROL SULFATE HFA 108 (90 BASE) MCG/ACT IN AERS: 1.0000 | INHALATION_SPRAY | Freq: Four times a day (QID) | RESPIRATORY_TRACT | 2 refills | Status: DC | PRN

## 2024-05-07 NOTE — Telephone Encounter (Signed)
 Complete

## 2024-06-21 ENCOUNTER — Other Ambulatory Visit: Payer: Self-pay | Admitting: Family

## 2024-06-21 DIAGNOSIS — R062 Wheezing: Secondary | ICD-10-CM

## 2024-06-21 DIAGNOSIS — J3089 Other allergic rhinitis: Secondary | ICD-10-CM

## 2024-06-21 NOTE — Telephone Encounter (Signed)
 Copied from CRM #[id number]. Topic: Clinical - Medication Refill >> Jun 21, 2024  9:39 AM Tobias L wrote: Medication: albuterol  (VENTOLIN  HFA) 108 (90 Base) MCG/ACT inhaler  Has the patient contacted their pharmacy? Yes Told to contact the office.   This is the patient's preferred pharmacy:  [id number] [place], [place] - [phone] AT [facility] & [address] [place] [address] Phone: [phone] Fax: [phone]  Is this the correct pharmacy for this prescription? Yes  Has the prescription been filled recently? No  Is the patient out of the medication? No  Has the patient been seen for an appointment in the last year OR does the patient have an upcoming appointment? Yes  Can we respond through MyChart? Yes  Agent: Please be advised that Rx refills may take up to 3 business days. We ask that you follow-up with your pharmacy.

## 2024-06-22 ENCOUNTER — Encounter (HOSPITAL_COMMUNITY): Payer: Self-pay

## 2024-06-22 ENCOUNTER — Ambulatory Visit (HOSPITAL_COMMUNITY)
Admission: RE | Admit: 2024-06-22 | Discharge: 2024-06-22 | Disposition: A | Discharge status: Home / Self Care | Source: Ambulatory Visit | Attending: Internal Medicine | Admitting: Internal Medicine

## 2024-06-22 DIAGNOSIS — C3490 Malignant neoplasm of unspecified part of unspecified bronchus or lung: Secondary | ICD-10-CM | POA: Diagnosis present

## 2024-06-22 MED ORDER — IOHEXOL 350 MG/ML SOLN
60.0000 mL | Freq: Once | INTRAVENOUS | Status: AC | PRN
  Administered 2024-06-22: 60 mL via INTRAVENOUS

## 2024-06-22 MED ORDER — ALBUTEROL SULFATE HFA 108 (90 BASE) MCG/ACT IN AERS: 1.0000 | INHALATION_SPRAY | Freq: Four times a day (QID) | RESPIRATORY_TRACT | 2 refills | Status: DC | PRN

## 2024-06-22 NOTE — Telephone Encounter (Signed)
 Requested Prescriptions  Pending Prescriptions Disp Refills   albuterol  (VENTOLIN  HFA) 108 (90 Base) MCG/ACT inhaler 18 g 2    Sig: Inhale 1-2 puffs into the lungs every 6 (six) hours as needed for wheezing or shortness of breath.     Pulmonology:  Beta Agonists 2 Passed - 06/22/2024  2:22 PM      Passed - Last BP in normal range    BP Readings from Last 1 Encounters:  04/05/24 125/84         Passed - Last Heart Rate in normal range    Pulse Readings from Last 1 Encounters:  04/05/24 88         Passed - Valid encounter within last 12 months    Recent Outpatient Visits           2 months ago Primary hypertension   [facility], [place], NP   6 months ago Primary hypertension   [facility], Amy J, NP   9 months ago Primary hypertension   [facility], [place], NP   12 months ago Primary hypertension   [facility], [place], NP   1 year ago Primary hypertension   [facility], Greig PARAS, NP

## 2024-07-01 ENCOUNTER — Ambulatory Visit (INDEPENDENT_AMBULATORY_CARE_PROVIDER_SITE_OTHER)

## 2024-07-01 VITALS — BP 132/72 | HR 94 | Ht 68.0 in | Wt 172.4 lb

## 2024-07-01 DIAGNOSIS — Z0000 Encounter for general adult medical examination without abnormal findings: Secondary | ICD-10-CM

## 2024-07-01 NOTE — Patient Instructions (Addendum)
 Christina [name],  Thank you for taking the time for your Medicare Wellness Visit. I appreciate your continued commitment to your health goals. Please review the care plan we discussed, and feel free to reach out if I can assist you further.  Please note that Annual Wellness Visits do not include a physical exam. Some assessments may be limited, especially if the visit was conducted virtually. If needed, we may recommend an in-person follow-up with your provider.  Ongoing Care Seeing your primary care provider every 3 to 6 months helps us  monitor your health and provide consistent, personalized care.   Referrals If a referral was made during today's visit and you haven't received any updates within two weeks, please contact the referred provider directly to check on the status.  Recommended Screenings:  Health Maintenance  Topic Date Due   Zoster (Shingles) Vaccine (1 of 2) Never done   Colon Cancer Screening  01/18/2025   Medicare Annual Wellness Visit  07/01/2025   DTaP/Tdap/Td vaccine (2 - Td or Tdap) 04/03/2027   Pneumococcal Vaccine for age over 45  Completed   Flu Shot  Completed   Osteoporosis screening with Bone Density Scan  Completed   Hepatitis C Screening  Completed   Meningitis B Vaccine  Aged Out   Breast Cancer Screening  Discontinued   COVID-19 Vaccine  Discontinued       07/01/2024   10:53 AM  Advanced Directives  Does Patient Have a Medical Advance Directive? No  Would patient like information on creating a medical advance directive? No - Patient declined    Vision: Annual vision screenings are recommended for early detection of glaucoma, cataracts, and diabetic retinopathy. These exams can also reveal signs of chronic conditions such as diabetes and high blood pressure.  Dental: Annual dental screenings help detect early signs of oral cancer, gum disease, and other conditions linked to overall health, including heart disease and diabetes.

## 2024-07-01 NOTE — Progress Notes (Signed)
 "  Chief Complaint  Patient presents with   Medicare Wellness     Subjective:   Christina [name] is a 79 y.o. female who presents for a Medicare Annual Wellness Visit.  Visit info / Clinical Intake: Medicare Wellness Visit Type:: Subsequent Annual Wellness Visit Persons participating in visit and providing information:: patient Medicare Wellness Visit Mode:: In-person (required for WTM) Interpreter Needed?: No Pre-visit prep was completed: yes AWV questionnaire completed by patient prior to visit?: yes Date:: 06/30/24 Living arrangements:: lives with spouse/significant other Patient's Overall Health Status Rating: very good Typical amount of pain: none Does pain affect daily life?: no Are you currently prescribed opioids?: no  Dietary Habits and Nutritional Risks How many meals a day?: 2 Eats fruit and vegetables daily?: yes Most meals are obtained by: preparing own meals In the last 2 weeks, have you had any of the following?: none Diabetic:: no  Functional Status Activities of Daily Living (to include ambulation/medication): Independent Ambulation: Independent with device- listed below Home Assistive Devices/Equipment: Eyeglasses; Dentures (specify type) Medication Administration: Independent Home Management (perform basic housework or laundry): Independent Manage your own finances?: yes Primary transportation is: driving Concerns about vision?: no *vision screening is required for WTM* Concerns about hearing?: no  Fall Screening Falls in the past year?: 0 Number of falls in past year: 0 Was there an injury with Fall?: 0 Fall Risk Category Calculator: 0 Patient Fall Risk Level: Low Fall Risk  Fall Risk Patient at Risk for Falls Due to: No Fall Risks Fall risk Follow up: Falls evaluation completed; Falls prevention discussed  Home and Transportation Safety: All rugs have non-skid backing?: yes All stairs or steps have railings?: yes Grab bars in the bathtub or  shower?: yes Have non-skid surface in bathtub or shower?: yes Good home lighting?: yes Regular seat belt use?: yes Hospital stays in the last year:: no  Cognitive Assessment Difficulty concentrating, remembering, or making decisions? : no Will 6CIT or Mini Cog be Completed: yes What year is it?: 0 points What month is it?: 0 points Give patient an address phrase to remember (5 components): [address] [place], [place] About what time is it?: 0 points Count backwards from 20 to 1: 0 points Say the months of the year in reverse: 0 points Repeat the address phrase from earlier: 2 points (Drive) 6 CIT Score: 2 points  Advance Directives (For Healthcare) Does Patient Have a Medical Advance Directive?: No Would patient like information on creating a medical advance directive?: No - Patient declined  Reviewed/Updated  Reviewed/Updated: Reviewed All (Medical, Surgical, Family, Medications, Allergies, Care Teams, Patient Goals)    Allergies (verified) Coconut (cocos nucifera), Codeine, Shrimp [shellfish allergy], and Trandolapril    Current Medications (verified) Outpatient Encounter Medications as of 07/01/2024  Medication Sig   albuterol  (VENTOLIN  HFA) 108 (90 Base) MCG/ACT inhaler Inhale 1-2 puffs into the lungs every 6 (six) hours as needed for wheezing or shortness of breath.   amLODipine  (NORVASC ) 10 MG tablet TAKE 1 TABLET(10 MG) BY MOUTH DAILY   aspirin EC 81 MG tablet Take 81 mg by mouth daily.   atorvastatin  (LIPITOR) 40 MG tablet Take 1 tablet (40 mg total) by mouth daily.   fluticasone  (FLONASE ) 50 MCG/ACT nasal spray Place 2 sprays into both nostrils daily.   omeprazole  (PRILOSEC) 40 MG capsule Take 1 capsule (40 mg total) by mouth daily.   potassium chloride  (KLOR-CON  M) 10 MEQ tablet TAKE 1 TABLET(10 MEQ) BY MOUTH TWICE DAILY   potassium chloride  (KLOR-CON   M) 10 MEQ tablet TAKE 1 TABLET(10 MEQ) BY MOUTH TWICE DAILY   sucralfate  (CARAFATE ) 1 g tablet TAKE 1 TABLET(1 GRAM)  BY MOUTH FOUR TIMES DAILY AS NEEDED FOR STOMACH ACID   Tiotropium Bromide Monohydrate  (SPIRIVA  RESPIMAT) 2.5 MCG/ACT AERS Inhale 2 puffs into the lungs daily.   amoxicillin -clavulanate (AUGMENTIN ) 875-125 MG tablet Take 1 tablet by mouth 2 (two) times daily. (Patient not taking: Reported on 07/01/2024)   benzonatate  (TESSALON  PERLES) 100 MG capsule Take 1 capsule (100 mg total) by mouth 3 (three) times daily as needed. (Patient not taking: Reported on 07/01/2024)   [DISCONTINUED] predniSONE  (DELTASONE ) 20 MG tablet Take 2 tablets (40 mg total) by mouth daily with breakfast.   No facility-administered encounter medications on file as of 07/01/2024.    History: Past Medical History:  Diagnosis Date   Allergy    GERD (gastroesophageal reflux disease)    Hyperlipidemia    Hypertension    nscl ca dx'd 05/2018   lung cancer   Reflux    Tubulovillous adenoma of colon 2003   Past Surgical History:  Procedure Laterality Date   CESAREAN SECTION     1 time   COLONOSCOPY     POLYPECTOMY     VIDEO BRONCHOSCOPY WITH ENDOBRONCHIAL NAVIGATION N/A 06/24/2018   Procedure: VIDEO BRONCHOSCOPY WITH ENDOBRONCHIAL NAVIGATION with biopsies, right upper lung lobe;  Surgeon: Kerrin Elspeth BROCKS, MD;  Location: [facility] OR;  Service: Thoracic;  Laterality: N/A;   VIDEO BRONCHOSCOPY WITH ENDOBRONCHIAL ULTRASOUND N/A 06/24/2018   Procedure: VIDEO BRONCHOSCOPY WITH ENDOBRONCHIAL ULTRASOUND, right upper lung lobe;  Surgeon: Kerrin Elspeth BROCKS, MD;  Location: [facility] OR;  Service: Thoracic;  Laterality: N/A;   Family History  Problem Relation Age of Onset   Hypertension Mother    Heart disease Mother    Leukemia Father    Cancer Father    Hypertension Sister    Hypertension Sister    Colon cancer Neg Hx    Colon polyps Neg Hx    Stomach cancer Neg Hx    Esophageal cancer Neg Hx    Social History   Occupational History   Occupation: school housekeeping    Comment: part-time  Tobacco Use   Smoking status: Former     Current packs/day: 0.50    Average packs/day: 0.5 packs/day for 50.0 years (25.0 ttl pk-yrs)    Types: Cigarettes    Passive exposure: Past   Smokeless tobacco: Never   Tobacco comments:    Previously quit in 2015 but has started smoking again   Vaping Use   Vaping status: Never Used  Substance and Sexual Activity   Alcohol use: No    Alcohol/week: 0.0 standard drinks of alcohol   Drug use: No   Sexual activity: Not Currently   Tobacco Counseling Counseling given: Yes Tobacco comments: Previously quit in 2015 but has started smoking again   SDOH Screenings   Food Insecurity: No Food Insecurity (07/01/2024)  Housing: Unknown (07/01/2024)  Transportation Needs: No Transportation Needs (07/01/2024)  Utilities: Not At Risk (07/01/2024)  Alcohol Screen: Low Risk (09/22/2023)  Depression (PHQ2-9): Low Risk (07/01/2024)  Financial Resource Strain: Low Risk (06/30/2024)  Physical Activity: Inactive (07/01/2024)  Social Connections: Moderately Integrated (07/01/2024)  Stress: No Stress Concern Present (07/01/2024)  Tobacco Use: Medium Risk (07/01/2024)  Health Literacy: Adequate Health Literacy (07/01/2024)   See flowsheets for full screening details  Depression Screen PHQ 2 & 9 Depression Scale- Over the past 2 weeks, how often have you been bothered by any  of the following problems? Little interest or pleasure in doing things: 0 Feeling down, depressed, or hopeless (PHQ Adolescent also includes...irritable): 0 PHQ-2 Total Score: 0 Trouble falling or staying asleep, or sleeping too much: 0 Feeling tired or having little energy: 0 Poor appetite or overeating (PHQ Adolescent also includes...weight loss): 0 Feeling bad about yourself - or that you are a failure or have let yourself or your family down: 0 Trouble concentrating on things, such as reading the newspaper or watching television (PHQ Adolescent also includes...like school work): 0 Moving or speaking so slowly that other people  could have noticed. Or the opposite - being so fidgety or restless that you have been moving around a lot more than usual: 0 Thoughts that you would be better off dead, or of hurting yourself in some way: 0 PHQ-9 Total Score: 0 If you checked off any problems, how difficult have these problems made it for you to do your work, take care of things at home, or get along with other people?: Not difficult at all  Depression Treatment Depression Interventions/Treatment : EYV7-0 Score <4 Follow-up Not Indicated     Goals Addressed               This Visit's Progress     Patient Stated (pt-stated)        Patient stated she plans to continue taking meds daily             Objective:    Today's Vitals   07/01/24 1052 07/01/24 1108  BP: 132/72   Pulse: (!) 102 94  SpO2: 98%   Weight: 172 lb 6.4 oz (78.2 kg)   Height: 5' 8 (1.727 m)    Body mass index is 26.21 kg/m.  Hearing/Vision screen Hearing Screening - Comments:: Denies hearing difficulties   Vision Screening - Comments:: Wears rx glasses - up to date with routine eye exams with [facility] Immunizations and Health Maintenance Health Maintenance  Topic Date Due   Zoster Vaccines- Shingrix (1 of 2) Never done   Colonoscopy  01/18/2025   Medicare Annual Wellness (AWV)  07/01/2025   DTaP/Tdap/Td (2 - Td or Tdap) 04/03/2027   Pneumococcal Vaccine: 50+ Years  Completed   Influenza Vaccine  Completed   Bone Density Scan  Completed   Hepatitis C Screening  Completed   Meningococcal B Vaccine  Aged Out   Mammogram  Discontinued   COVID-19 Vaccine  Discontinued        Assessment/Plan:  This is a routine wellness examination for [facility].  Patient Care Team: Jaycee Greig PARAS, NP as PCP - General (Nurse Practitioner) Aneita Gwendlyn DASEN, MD (Inactive) as Consulting Physician (Gastroenterology) Ob/Gyn, Landy Stains as Consulting Physician Malcom Brightly, MD as Consulting Physician (Neurology) Tish Alm DEL, MD (Inactive) as  Consulting Physician (Family Medicine)  I have personally reviewed and noted the following in the patients chart:   Medical and social history Use of alcohol, tobacco or illicit drugs  Current medications and supplements including opioid prescriptions. Functional ability and status Nutritional status Physical activity Advanced directives List of other physicians Hospitalizations, surgeries, and ER visits in previous 12 months Vitals Screenings to include cognitive, depression, and falls Referrals and appointments  No orders of the defined types were placed in this encounter.  In addition, I have reviewed and discussed with patient certain preventive protocols, quality metrics, and best practice recommendations. A written personalized care plan for preventive services as well as general preventive health recommendations were provided to patient.  Verdie CHRISTELLA Saba, CMA   07/01/2024   Return in 1 year (on 07/01/2025).  After Visit Summary: (In Person-Declined) Patient declined AVS at this time.  Nurse Notes: scheduled 2027 AWV appt "

## 2024-07-06 ENCOUNTER — Ambulatory Visit (INDEPENDENT_AMBULATORY_CARE_PROVIDER_SITE_OTHER): Admitting: Family

## 2024-07-06 VITALS — BP 129/88 | HR 93 | Ht 68.0 in | Wt 172.0 lb

## 2024-07-06 DIAGNOSIS — E876 Hypokalemia: Secondary | ICD-10-CM | POA: Diagnosis not present

## 2024-07-06 DIAGNOSIS — I10 Essential (primary) hypertension: Secondary | ICD-10-CM | POA: Diagnosis not present

## 2024-07-06 DIAGNOSIS — E785 Hyperlipidemia, unspecified: Secondary | ICD-10-CM | POA: Diagnosis not present

## 2024-07-06 DIAGNOSIS — K219 Gastro-esophageal reflux disease without esophagitis: Secondary | ICD-10-CM

## 2024-07-06 DIAGNOSIS — J3089 Other allergic rhinitis: Secondary | ICD-10-CM | POA: Diagnosis not present

## 2024-07-06 MED ORDER — ALBUTEROL SULFATE HFA 108 (90 BASE) MCG/ACT IN AERS: 1.0000 | INHALATION_SPRAY | Freq: Four times a day (QID) | RESPIRATORY_TRACT | 2 refills | Status: AC | PRN

## 2024-07-06 MED ORDER — AMLODIPINE BESYLATE 10 MG PO TABS: ORAL_TABLET | ORAL | 0 refills | Status: AC

## 2024-07-06 MED ORDER — OMEPRAZOLE 40 MG PO CPDR: 40.0000 mg | DELAYED_RELEASE_CAPSULE | Freq: Every day | ORAL | 0 refills | Status: AC

## 2024-07-06 MED ORDER — FLUTICASONE PROPIONATE 50 MCG/ACT NA SUSP: 2.0000 | Freq: Every day | NASAL | 2 refills | Status: AC

## 2024-07-06 MED ORDER — ATORVASTATIN CALCIUM 40 MG PO TABS: 40.0000 mg | ORAL_TABLET | Freq: Every day | ORAL | 0 refills | Status: AC

## 2024-07-06 MED ORDER — POTASSIUM CHLORIDE CRYS ER 10 MEQ PO TBCR: EXTENDED_RELEASE_TABLET | ORAL | 0 refills | Status: AC

## 2024-07-06 NOTE — Progress Notes (Signed)
 "   Patient ID: Christina [name], female    DOB: Sep 09, 1945  MRN: [id number]  CC: Chronic Conditions Follow-Up  Subjective: Christina [name] is a 79 y.o. female who presents for chronic conditions follow-up.   Her concerns today include:  - Doing well on Amlodipine , no issues/concerns. She does not complain of red flag symptoms such as but not limited to chest pain, shortness of breath, worst headache of life, nausea/vomiting.  - Doing well on Atorvastatin , no issues/concerns.  - Doing well on Omeprazole , no issues/concerns. - States pharmacy gave her an Albuterol  inhaler which isn't like the one she has been using. States the Albuterol  inhaler pharmacy gave her has been making her itch. States she would like to continue with the Albuterol  inhaler she has been taking. I discussed with patient in detail it may be possible that pharmacy purchased their medications from a different supplier. I discussed with patient in detail to show pharmacy the Albuterol  inhaler she has been taking to see if they have in stock. If not patient is aware to notify primary provider to send to another pharmacy of her choice. Patient verbalized understanding/agreement.   - Doing well on Fluticasone , no issues/concerns.  - Doing well on Potassium Chloride , no issues/concerns.  - Established with Nephrology.   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) 07/06/2018   Goals of care, counseling/discussion 07/06/2018   Encounter for antineoplastic chemotherapy 07/06/2018   Primary cancer of right upper lobe of lung (HCC) 06/18/2018   Acute URI 05/25/2018   Seasonal allergies 10/06/2017   Thyromegaly 07/01/2017   Colon polyps 04/02/2017   Former smoker 09/27/2016   Smoker 09/27/2016   Cough 04/25/2016   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   Angioedema 08/04/2014    GERD (gastroesophageal reflux disease) 08/10/2013   Other and unspecified hyperlipidemia 06/23/2013   Essential hypertension 07/23/2012     Medications Ordered Prior to Encounter[1]  Allergies[2]  Social History   Socioeconomic History   Marital status: Married    Spouse name: Christina [name]   Number of children: 1   Years of education: 12th grade   Highest education level: 12th grade  Occupational History   Occupation: school housekeeping    Comment: part-time  Tobacco Use   Smoking status: Former    Current packs/day: 0.50    Average packs/day: 0.5 packs/day for 50.0 years (25.0 ttl pk-yrs)    Types: Cigarettes    Passive exposure: Past   Smokeless tobacco: Never   Tobacco comments:    Previously quit in 2015 but has started smoking again   Vaping Use   Vaping status: Never Used  Substance and Sexual Activity   Alcohol use: No    Alcohol/week: 0.0 standard drinks of alcohol   Drug use: No   Sexual activity: Not Currently  Other Topics Concern   Not on file  Social History Narrative   Lives with her husband. Her daughter also lives in [place] with her husband and 3 children.   Social Drivers of Health   Tobacco Use: Medium Risk (07/01/2024)   Patient History    Smoking Tobacco Use: Former    Smokeless Tobacco Use: Never    Passive Exposure: Past  Physicist, Medical Strain: Low Risk (06/30/2024)   Overall Financial Resource Strain (CARDIA)    Difficulty of Paying Living Expenses: Not hard at all  Food Insecurity: No  Food Insecurity (07/01/2024)   Epic    Worried About Programme Researcher, Broadcasting/film/video in the Last Year: Never true    Ran Out of Food in the Last Year: Never true  Transportation Needs: No Transportation Needs (07/01/2024)   Epic    Lack of Transportation (Medical): No    Lack of Transportation (Non-Medical): No  Physical Activity: Inactive (07/01/2024)   Exercise Vital Sign    Days of Exercise per Week: 0 days    Minutes of Exercise per Session: 0 min  Stress: No  Stress Concern Present (07/01/2024)   [employer] of Occupational Health - Occupational Stress Questionnaire    Feeling of Stress: Not at all  Social Connections: Moderately Integrated (07/01/2024)   Social Connection and Isolation Panel    Frequency of Communication with Friends and Family: More than three times a week    Frequency of Social Gatherings with Friends and Family: More than three times a week    Attends Religious Services: More than 4 times per year    Active Member of [employer] or Organizations: No    Attends Banker Meetings: Never    Marital Status: Married  Catering Manager Violence: Not At Risk (07/01/2024)   Epic    Fear of Current or Ex-Partner: No    Emotionally Abused: No    Physically Abused: No    Sexually Abused: No  Depression (PHQ2-9): Low Risk (07/01/2024)   Depression (PHQ2-9)    PHQ-2 Score: 0  Alcohol Screen: Low Risk (09/22/2023)   Alcohol Screen    Last Alcohol Screening Score (AUDIT): 0  Housing: Unknown (07/01/2024)   Epic    Unable to Pay for Housing in the Last Year: No    Number of Times Moved in the Last Year: Not on file    Homeless in the Last Year: No  Utilities: Not At Risk (07/01/2024)   Epic    Threatened with loss of utilities: No  Health Literacy: Adequate Health Literacy (07/01/2024)   B1300 Health Literacy    Frequency of need for help with medical instructions: Never    Family History  Problem Relation Age of Onset   Hypertension Mother    Heart disease Mother    Leukemia Father    Cancer Father    Hypertension Sister    Hypertension Sister    Colon cancer Neg Hx    Colon polyps Neg Hx    Stomach cancer Neg Hx    Esophageal cancer Neg Hx     Past Surgical History:  Procedure Laterality Date   CESAREAN SECTION     1 time   COLONOSCOPY     POLYPECTOMY     VIDEO BRONCHOSCOPY WITH ENDOBRONCHIAL NAVIGATION N/A 06/24/2018   Procedure: VIDEO BRONCHOSCOPY WITH ENDOBRONCHIAL NAVIGATION with biopsies, right upper  lung lobe;  Surgeon: Kerrin Elspeth BROCKS, MD;  Location: [facility] OR;  Service: Thoracic;  Laterality: N/A;   VIDEO BRONCHOSCOPY WITH ENDOBRONCHIAL ULTRASOUND N/A 06/24/2018   Procedure: VIDEO BRONCHOSCOPY WITH ENDOBRONCHIAL ULTRASOUND, right upper lung lobe;  Surgeon: Kerrin Elspeth BROCKS, MD;  Location: [facility] OR;  Service: Thoracic;  Laterality: N/A;    ROS: Review of Systems Negative except as stated above  PHYSICAL EXAM: BP 129/88   Pulse 93   Ht 5' 8 (1.727 m)   Wt 172 lb (78 kg)   SpO2 94%   BMI 26.15 kg/m   Physical Exam HENT:     Head: Normocephalic and atraumatic.     Nose: Nose normal.  Mouth/Throat:     Mouth: Mucous membranes are moist.     Pharynx: Oropharynx is clear.  Eyes:     Extraocular Movements: Extraocular movements intact.     Conjunctiva/sclera: Conjunctivae normal.     Pupils: Pupils are equal, round, and reactive to light.  Cardiovascular:     Rate and Rhythm: Normal rate and regular rhythm.     Pulses: Normal pulses.     Heart sounds: Normal heart sounds.  Pulmonary:     Effort: Pulmonary effort is normal.     Breath sounds: Normal breath sounds.  Musculoskeletal:        General: Normal range of motion.     Cervical back: Normal range of motion and neck supple.  Neurological:     General: No focal deficit present.     Mental Status: She is alert and oriented to person, place, and time.  Psychiatric:        Mood and Affect: Mood normal.        Behavior: Behavior normal.     ASSESSMENT AND PLAN: 1. Primary hypertension (Primary) - Continue Amlodipine  as prescribed.  - Counseled on blood pressure goal of less than 130/80, low-sodium, DASH diet, medication compliance, and 150 minutes of moderate intensity exercise per week as tolerated. Counseled on medication adherence and adverse effects. - Follow-up with primary provider in 3 months or sooner if needed. - amLODipine  (NORVASC ) 10 MG tablet; TAKE 1 TABLET(10 MG) BY MOUTH DAILY  Dispense: 90  tablet; Refill: 0  2. Hyperlipidemia, unspecified hyperlipidemia type - Continue Atorvastatin  as prescribed. Counseled on medication adherence/adverse effects.  - Follow-up with primary provider in 3 months or sooner if needed.  - atorvastatin  (LIPITOR) 40 MG tablet; Take 1 tablet (40 mg total) by mouth daily.  Dispense: 90 tablet; Refill: 0  3. Gastroesophageal reflux disease without esophagitis - Continue Omeprazole  as prescribed. Counseled on medication adherence/adverse effects.  - Follow-up with primary provider in 3  months or sooner if needed.  - omeprazole  (PRILOSEC) 40 MG capsule; Take 1 capsule (40 mg total) by mouth daily.  Dispense: 90 capsule; Refill: 0  4. Hypokalemia - Continue Potassium Chloride  as prescribed. Counseled on medication adherence/adverse effects.  - Follow-up with primary provider in 3 months or sooner if needed. - potassium chloride  (KLOR-CON  M) 10 MEQ tablet; TAKE 1 TABLET(10 MEQ) BY MOUTH TWICE DAILY  Dispense: 180 tablet; Refill: 0  5. Perennial allergic rhinitis - Continue Albuterol  inhaler and Fluticasone  as prescribed. Counseled on medication adherence/adverse effects.  - Follow-up with primary provider in 3 months or sooner if needed.  - albuterol  (VENTOLIN  HFA) 108 (90 Base) MCG/ACT inhaler; Inhale 1-2 puffs into the lungs every 6 (six) hours as needed for wheezing or shortness of breath.  Dispense: 18 g; Refill: 2 - fluticasone  (FLONASE ) 50 MCG/ACT nasal spray; Place 2 sprays into both nostrils daily.  Dispense: 16 g; Refill: 2   Patient was given the opportunity to ask questions.  Patient verbalized understanding of the plan and was able to repeat key elements of the plan. Patient was given clear instructions to go to Emergency Department or return to medical center if symptoms don't improve, worsen, or new problems develop.The patient verbalized understanding.   Requested Prescriptions   Signed Prescriptions Disp Refills   albuterol  (VENTOLIN   HFA) 108 (90 Base) MCG/ACT inhaler 18 g 2    Sig: Inhale 1-2 puffs into the lungs every 6 (six) hours as needed for wheezing or shortness of breath.   amLODipine  (  NORVASC ) 10 MG tablet 90 tablet 0    Sig: TAKE 1 TABLET(10 MG) BY MOUTH DAILY   atorvastatin  (LIPITOR) 40 MG tablet 90 tablet 0    Sig: Take 1 tablet (40 mg total) by mouth daily.   fluticasone  (FLONASE ) 50 MCG/ACT nasal spray 16 g 2    Sig: Place 2 sprays into both nostrils daily.   omeprazole  (PRILOSEC) 40 MG capsule 90 capsule 0    Sig: Take 1 capsule (40 mg total) by mouth daily.   potassium chloride  (KLOR-CON  M) 10 MEQ tablet 180 tablet 0    Sig: TAKE 1 TABLET(10 MEQ) BY MOUTH TWICE DAILY    Return in about 3 months (around 10/04/2024) for Follow-Up or next available chronic conditions.  Greig JINNY Chute, NP      [1]  Current Outpatient Medications on File Prior to Visit  Medication Sig Dispense Refill   aspirin EC 81 MG tablet Take 81 mg by mouth daily.     sucralfate  (CARAFATE ) 1 g tablet TAKE 1 TABLET(1 GRAM) BY MOUTH FOUR TIMES DAILY AS NEEDED FOR STOMACH ACID 120 tablet 2   No current facility-administered medications on file prior to visit.  [2]  Allergies Allergen Reactions   Coconut (Cocos Nucifera) Hives   Codeine Other (See Comments)    Upsets stomach really bad   Shrimp [Shellfish Allergy] Hives   Trandolapril  Swelling    Swelling of the face/lips 07/2014   "

## 2024-09-20 ENCOUNTER — Other Ambulatory Visit

## 2024-09-29 ENCOUNTER — Inpatient Hospital Stay: Payer: Self-pay | Admitting: Internal Medicine

## 2024-10-05 ENCOUNTER — Ambulatory Visit: Payer: Self-pay | Admitting: Family

## 2025-07-14 ENCOUNTER — Ambulatory Visit: Payer: Self-pay
